# Patient Record
Sex: Female | Born: 1970 | Race: White | Hispanic: No | Marital: Single | Smoking: Current every day smoker
Health system: Southern US, Community
[De-identification: ages and names within clinical notes are randomized; demographics above are authoritative.]

## PROBLEM LIST (undated history)

## (undated) DIAGNOSIS — Z9889 Other specified postprocedural states: Secondary | ICD-10-CM

## (undated) DIAGNOSIS — Z8041 Family history of malignant neoplasm of ovary: Secondary | ICD-10-CM

## (undated) DIAGNOSIS — R112 Nausea with vomiting, unspecified: Secondary | ICD-10-CM

## (undated) DIAGNOSIS — F32A Depression, unspecified: Secondary | ICD-10-CM

## (undated) DIAGNOSIS — I10 Essential (primary) hypertension: Secondary | ICD-10-CM

## (undated) DIAGNOSIS — F419 Anxiety disorder, unspecified: Secondary | ICD-10-CM

## (undated) DIAGNOSIS — R51 Headache: Secondary | ICD-10-CM

## (undated) DIAGNOSIS — K047 Periapical abscess without sinus: Secondary | ICD-10-CM

## (undated) DIAGNOSIS — K56609 Unspecified intestinal obstruction, unspecified as to partial versus complete obstruction: Secondary | ICD-10-CM

## (undated) DIAGNOSIS — R933 Abnormal findings on diagnostic imaging of other parts of digestive tract: Secondary | ICD-10-CM

## (undated) DIAGNOSIS — E0590 Thyrotoxicosis, unspecified without thyrotoxic crisis or storm: Secondary | ICD-10-CM

## (undated) DIAGNOSIS — IMO0001 Reserved for inherently not codable concepts without codable children: Secondary | ICD-10-CM

## (undated) DIAGNOSIS — Z803 Family history of malignant neoplasm of breast: Secondary | ICD-10-CM

## (undated) DIAGNOSIS — F329 Major depressive disorder, single episode, unspecified: Secondary | ICD-10-CM

## (undated) DIAGNOSIS — K219 Gastro-esophageal reflux disease without esophagitis: Secondary | ICD-10-CM

## (undated) DIAGNOSIS — R519 Headache, unspecified: Secondary | ICD-10-CM

## (undated) DIAGNOSIS — J449 Chronic obstructive pulmonary disease, unspecified: Secondary | ICD-10-CM

## (undated) HISTORY — DX: Unspecified intestinal obstruction, unspecified as to partial versus complete obstruction: K56.609

## (undated) HISTORY — PX: FOOT SURGERY: SHX648

## (undated) HISTORY — DX: Depression, unspecified: F32.A

## (undated) HISTORY — DX: Major depressive disorder, single episode, unspecified: F32.9

## (undated) HISTORY — PX: WISDOM TOOTH EXTRACTION: SHX21

## (undated) HISTORY — DX: Family history of malignant neoplasm of breast: Z80.3

## (undated) HISTORY — DX: Abnormal findings on diagnostic imaging of other parts of digestive tract: R93.3

## (undated) HISTORY — DX: Anxiety disorder, unspecified: F41.9

## (undated) HISTORY — DX: Family history of malignant neoplasm of ovary: Z80.41

## (undated) HISTORY — DX: Periapical abscess without sinus: K04.7

---

## 1997-12-16 HISTORY — PX: DILATION AND CURETTAGE OF UTERUS: SHX78

## 1998-09-27 ENCOUNTER — Emergency Department (HOSPITAL_COMMUNITY): Admission: EM | Admit: 1998-09-27 | Discharge: 1998-09-27 | Payer: Self-pay | Admitting: Emergency Medicine

## 1998-09-28 ENCOUNTER — Ambulatory Visit (HOSPITAL_COMMUNITY): Admission: RE | Admit: 1998-09-28 | Discharge: 1998-09-28 | Payer: Self-pay | Admitting: Emergency Medicine

## 1998-12-16 HISTORY — PX: OTHER SURGICAL HISTORY: SHX169

## 2002-05-16 ENCOUNTER — Encounter: Payer: Self-pay | Admitting: Emergency Medicine

## 2002-05-16 ENCOUNTER — Emergency Department (HOSPITAL_COMMUNITY): Admission: EM | Admit: 2002-05-16 | Discharge: 2002-05-16 | Payer: Self-pay

## 2002-07-05 ENCOUNTER — Emergency Department (HOSPITAL_COMMUNITY): Admission: EM | Admit: 2002-07-05 | Discharge: 2002-07-05 | Payer: Self-pay | Admitting: Emergency Medicine

## 2002-08-16 ENCOUNTER — Emergency Department (HOSPITAL_COMMUNITY): Admission: EM | Admit: 2002-08-16 | Discharge: 2002-08-16 | Payer: Self-pay | Admitting: Emergency Medicine

## 2002-08-16 ENCOUNTER — Encounter: Payer: Self-pay | Admitting: Emergency Medicine

## 2002-08-21 ENCOUNTER — Emergency Department (HOSPITAL_COMMUNITY): Admission: EM | Admit: 2002-08-21 | Discharge: 2002-08-21 | Payer: Self-pay | Admitting: Emergency Medicine

## 2002-08-21 ENCOUNTER — Encounter: Payer: Self-pay | Admitting: Emergency Medicine

## 2002-10-11 ENCOUNTER — Emergency Department (HOSPITAL_COMMUNITY): Admission: EM | Admit: 2002-10-11 | Discharge: 2002-10-11 | Payer: Self-pay | Admitting: *Deleted

## 2002-10-11 ENCOUNTER — Encounter: Payer: Self-pay | Admitting: Emergency Medicine

## 2003-02-17 ENCOUNTER — Encounter: Payer: Self-pay | Admitting: Emergency Medicine

## 2003-02-17 ENCOUNTER — Emergency Department (HOSPITAL_COMMUNITY): Admission: EM | Admit: 2003-02-17 | Discharge: 2003-02-17 | Payer: Self-pay | Admitting: Emergency Medicine

## 2003-06-09 ENCOUNTER — Emergency Department (HOSPITAL_COMMUNITY): Admission: AD | Admit: 2003-06-09 | Discharge: 2003-06-09 | Payer: Self-pay | Admitting: Emergency Medicine

## 2003-12-17 HISTORY — PX: INCISE AND DRAIN ABCESS: PRO64

## 2005-10-09 ENCOUNTER — Emergency Department (HOSPITAL_COMMUNITY): Admission: EM | Admit: 2005-10-09 | Discharge: 2005-10-09 | Payer: Self-pay | Admitting: Emergency Medicine

## 2005-10-11 ENCOUNTER — Emergency Department (HOSPITAL_COMMUNITY): Admission: EM | Admit: 2005-10-11 | Discharge: 2005-10-11 | Payer: Self-pay | Admitting: Emergency Medicine

## 2006-03-04 ENCOUNTER — Encounter (INDEPENDENT_AMBULATORY_CARE_PROVIDER_SITE_OTHER): Payer: Self-pay | Admitting: *Deleted

## 2006-03-04 ENCOUNTER — Other Ambulatory Visit: Admission: RE | Admit: 2006-03-04 | Discharge: 2006-03-04 | Payer: Self-pay | Admitting: Family Medicine

## 2006-03-04 ENCOUNTER — Other Ambulatory Visit: Admission: RE | Admit: 2006-03-04 | Discharge: 2006-03-04 | Payer: Self-pay

## 2006-07-27 ENCOUNTER — Emergency Department (HOSPITAL_COMMUNITY): Admission: EM | Admit: 2006-07-27 | Discharge: 2006-07-27 | Payer: Self-pay | Admitting: Emergency Medicine

## 2006-08-20 ENCOUNTER — Ambulatory Visit (HOSPITAL_COMMUNITY): Admission: RE | Admit: 2006-08-20 | Discharge: 2006-08-20 | Payer: Self-pay | Admitting: Family Medicine

## 2006-09-18 ENCOUNTER — Emergency Department (HOSPITAL_COMMUNITY): Admission: EM | Admit: 2006-09-18 | Discharge: 2006-09-18 | Payer: Self-pay | Admitting: Emergency Medicine

## 2006-09-30 ENCOUNTER — Other Ambulatory Visit: Admission: RE | Admit: 2006-09-30 | Discharge: 2006-09-30 | Payer: Self-pay | Admitting: Unknown Physician Specialty

## 2006-09-30 ENCOUNTER — Encounter (INDEPENDENT_AMBULATORY_CARE_PROVIDER_SITE_OTHER): Payer: Self-pay | Admitting: *Deleted

## 2006-10-01 ENCOUNTER — Other Ambulatory Visit: Admission: RE | Admit: 2006-10-01 | Discharge: 2006-10-01 | Payer: Self-pay | Admitting: Unknown Physician Specialty

## 2007-06-03 ENCOUNTER — Emergency Department (HOSPITAL_COMMUNITY): Admission: EM | Admit: 2007-06-03 | Discharge: 2007-06-03 | Payer: Self-pay | Admitting: Emergency Medicine

## 2007-07-14 ENCOUNTER — Encounter (INDEPENDENT_AMBULATORY_CARE_PROVIDER_SITE_OTHER): Payer: Self-pay | Admitting: Unknown Physician Specialty

## 2007-07-14 ENCOUNTER — Other Ambulatory Visit: Admission: RE | Admit: 2007-07-14 | Discharge: 2007-07-14 | Payer: Self-pay | Admitting: Unknown Physician Specialty

## 2007-11-10 ENCOUNTER — Emergency Department (HOSPITAL_COMMUNITY): Admission: EM | Admit: 2007-11-10 | Discharge: 2007-11-10 | Payer: Self-pay | Admitting: Emergency Medicine

## 2008-11-22 ENCOUNTER — Emergency Department (HOSPITAL_COMMUNITY): Admission: EM | Admit: 2008-11-22 | Discharge: 2008-11-22 | Payer: Self-pay | Admitting: Emergency Medicine

## 2009-10-02 ENCOUNTER — Emergency Department (HOSPITAL_COMMUNITY): Admission: EM | Admit: 2009-10-02 | Discharge: 2009-10-03 | Payer: Self-pay | Admitting: Emergency Medicine

## 2009-12-28 ENCOUNTER — Emergency Department (HOSPITAL_COMMUNITY): Admission: EM | Admit: 2009-12-28 | Discharge: 2009-12-28 | Payer: Self-pay | Admitting: Emergency Medicine

## 2011-01-02 ENCOUNTER — Emergency Department (HOSPITAL_COMMUNITY)
Admission: EM | Admit: 2011-01-02 | Discharge: 2011-01-02 | Payer: Self-pay | Source: Home / Self Care | Admitting: Emergency Medicine

## 2011-03-03 LAB — BASIC METABOLIC PANEL
CO2: 26 mEq/L (ref 19–32)
Sodium: 138 mEq/L (ref 135–145)

## 2011-03-03 LAB — DIFFERENTIAL
Basophils Absolute: 0 10*3/uL (ref 0.0–0.1)
Basophils Relative: 0 % (ref 0–1)
Monocytes Absolute: 0.5 10*3/uL (ref 0.1–1.0)
Neutro Abs: 7.3 10*3/uL (ref 1.7–7.7)
Neutrophils Relative %: 74 % (ref 43–77)

## 2011-03-03 LAB — CBC
Hemoglobin: 14.4 g/dL (ref 12.0–15.0)
MCHC: 33.1 g/dL (ref 30.0–36.0)
Platelets: 270 10*3/uL (ref 150–400)
RBC: 4.52 MIL/uL (ref 3.87–5.11)

## 2011-03-21 LAB — BASIC METABOLIC PANEL
BUN: 9 mg/dL (ref 6–23)
CO2: 20 mEq/L (ref 19–32)
Creatinine, Ser: 0.89 mg/dL (ref 0.4–1.2)
GFR calc non Af Amer: >60 mL/min (ref >60)
Glucose, Bld: 96 mg/dL (ref 70–99)
Potassium: 3.6 mEq/L (ref 3.5–5.1)

## 2011-03-21 LAB — DIFFERENTIAL
Lymphs Abs: 1 10*3/uL (ref 0.7–4.0)
Monocytes Relative: 5 % (ref 3–12)
Neutro Abs: 15.2 10*3/uL — ABNORMAL HIGH (ref 1.7–7.7)

## 2011-03-21 LAB — GLUCOSE, CAPILLARY

## 2011-03-21 LAB — CBC
MCHC: 34.6 g/dL (ref 30.0–36.0)
MCV: 97.8 fL (ref 78.0–100.0)

## 2011-09-20 LAB — URINALYSIS, ROUTINE W REFLEX MICROSCOPIC
Bilirubin Urine: NEGATIVE
Glucose, UA: NEGATIVE mg/dL
Ketones, ur: NEGATIVE mg/dL
Nitrite: NEGATIVE
Specific Gravity, Urine: 1.013 (ref 1.005–1.030)

## 2011-09-20 LAB — URINE MICROSCOPIC-ADD ON

## 2012-03-16 ENCOUNTER — Other Ambulatory Visit: Payer: Self-pay | Admitting: Infectious Diseases

## 2012-03-16 ENCOUNTER — Ambulatory Visit
Admission: RE | Admit: 2012-03-16 | Discharge: 2012-03-16 | Disposition: A | Discharge status: Home / Self Care | Payer: No Typology Code available for payment source | Source: Ambulatory Visit | Attending: Infectious Diseases | Admitting: Infectious Diseases

## 2012-03-16 DIAGNOSIS — R7611 Nonspecific reaction to tuberculin skin test without active tuberculosis: Secondary | ICD-10-CM

## 2012-11-10 ENCOUNTER — Ambulatory Visit (INDEPENDENT_AMBULATORY_CARE_PROVIDER_SITE_OTHER): Payer: Self-pay | Admitting: Family Medicine

## 2012-11-10 ENCOUNTER — Encounter: Payer: Self-pay | Admitting: Family Medicine

## 2012-11-10 VITALS — BP 134/89 | HR 69 | Ht 64.0 in | Wt 192.0 lb

## 2012-11-10 DIAGNOSIS — I10 Essential (primary) hypertension: Secondary | ICD-10-CM | POA: Insufficient documentation

## 2012-11-10 DIAGNOSIS — F17200 Nicotine dependence, unspecified, uncomplicated: Secondary | ICD-10-CM

## 2012-11-10 DIAGNOSIS — Z720 Tobacco use: Secondary | ICD-10-CM

## 2012-11-10 DIAGNOSIS — R635 Abnormal weight gain: Secondary | ICD-10-CM

## 2012-11-10 DIAGNOSIS — R609 Edema, unspecified: Secondary | ICD-10-CM

## 2012-11-10 DIAGNOSIS — IMO0001 Reserved for inherently not codable concepts without codable children: Secondary | ICD-10-CM

## 2012-11-10 DIAGNOSIS — E663 Overweight: Secondary | ICD-10-CM | POA: Insufficient documentation

## 2012-11-10 DIAGNOSIS — R030 Elevated blood-pressure reading, without diagnosis of hypertension: Secondary | ICD-10-CM

## 2012-11-10 DIAGNOSIS — E669 Obesity, unspecified: Secondary | ICD-10-CM | POA: Insufficient documentation

## 2012-11-10 MED ORDER — VARENICLINE TARTRATE 0.5 MG X 11 & 1 MG X 42 PO MISC: ORAL | Status: DC

## 2012-11-10 NOTE — Assessment & Plan Note (Signed)
Starting Chantix (risks benefits discussed adn pt amenable to try) Continue E cig

## 2012-11-10 NOTE — Assessment & Plan Note (Signed)
BP today is prehypertensive.  Will reckeck at next appt No need to start therapy at this time Will have pt check at home

## 2012-11-10 NOTE — Assessment & Plan Note (Signed)
Likely secondary to Latuda dose increase.  Continue diet and exercise.  Pt to return in 2 wks Pt to discuss changing medication w/ [facility] doctor

## 2012-11-10 NOTE — Assessment & Plan Note (Signed)
Exact etiology unclear.  Likely secondary to wt gain and medications.  No evidence for cardio hepatic cause at this time. Will follow

## 2012-11-10 NOTE — Patient Instructions (Addendum)
Thank you for coming in today You are doing well Keep checking your blood pressure from time to time at home or in stores Keep trying to quit smoking Start the Chantix if able.  Stop this medicine if you have any heart symptoms or if you feel like your depression is gettign worse Go to [email or web address] for more stop smoking information Please continue to exercise daily and to meet with your Behavioral Medicine doctor Please come back to see me in 2 weeks. We will check your weight, do a pap smear, and see how your smoking is going Drive safe and have a great day.

## 2012-11-10 NOTE — Progress Notes (Signed)
Mariah Rice is a 41 y.o. female who presents to [facility] today for new patient visit  Depression: followed by [facility] physician. Started Jordan some time ago but had dose doubled about 1 mo ago. Denies SI/HI. Mood is improved  Tobacco abuse: continues to smoke 1/2ppd. Would like to quit. Smoked since age 20. Denies SOB, unintentional wt loss. Using E.cig to try and quit  Wt gain: reports 30lb wt gain in last month. Denies any chang ein diet or exercise routine. Walks QOD. Increased Latuda about 1 mo ago. Denies any cold intolerance, n/v/d/c, fatigue, palpitations. Does report some Le swelling since putting on wt.   Hypertension: Pt reports recent elevated BPs at other offices in the 150s. Denies CP, Ha, Lightheadedness, syncope, SOB. No previous medications for BP.    The following portions of the patient's history were reviewed and updated as appropriate: allergies, current medications, past medical history, family and social history, and problem list.  Patient is a smoker  Past Medical History  Diagnosis Date  . Depression   . Anxiety   . Dental abscess     ROS as above otherwise neg.    Medications reviewed. Current Outpatient Prescriptions  Medication Sig Dispense Refill  . hydrOXYzine (ATARAX/VISTARIL) 50 MG tablet Take 50 mg by mouth 3 (three) times daily as needed.      . lurasidone (LATUDA) 40 MG TABS Take 80 mg by mouth at bedtime.      . varenicline (CHANTIX PAK) 0.5 MG X 11 & 1 MG X 42 tablet Take one 0.5 mg tablet by mouth once daily for 3 days, then increase to one 0.5 mg tablet twice daily for 4 days, then increase to one 1 mg tablet twice daily.  53 tablet  0    Exam:  BP 134/89  Pulse 69  Ht 5\' 4"  (1.626 m)  Wt 192 lb (87.091 kg)  BMI 32.96 kg/m2 Gen: Well NAD HEENT: EOMI,  MMM, no thyromegaly Lungs: CTABL Nl WOB Heart: RRR no MRG, 2+ pedal pulses Abd: NABS, NT, ND Ext: Trace LE edema Exts: Non edematous BL  LE, warm and well perfused.   No results found for  this or any previous visit (from the past 72 hour(s)).

## 2012-11-24 ENCOUNTER — Encounter: Payer: Self-pay | Admitting: Family Medicine

## 2012-11-24 ENCOUNTER — Ambulatory Visit (INDEPENDENT_AMBULATORY_CARE_PROVIDER_SITE_OTHER): Payer: Self-pay | Admitting: Family Medicine

## 2012-11-24 VITALS — BP 151/97 | HR 79 | Temp 98.3°F | Ht 64.0 in | Wt 193.0 lb

## 2012-11-24 DIAGNOSIS — R635 Abnormal weight gain: Secondary | ICD-10-CM

## 2012-11-24 DIAGNOSIS — Z23 Encounter for immunization: Secondary | ICD-10-CM

## 2012-11-24 DIAGNOSIS — R51 Headache: Secondary | ICD-10-CM

## 2012-11-24 DIAGNOSIS — R030 Elevated blood-pressure reading, without diagnosis of hypertension: Secondary | ICD-10-CM

## 2012-11-24 DIAGNOSIS — R519 Headache, unspecified: Secondary | ICD-10-CM

## 2012-11-24 DIAGNOSIS — F17200 Nicotine dependence, unspecified, uncomplicated: Secondary | ICD-10-CM

## 2012-11-24 DIAGNOSIS — Z720 Tobacco use: Secondary | ICD-10-CM

## 2012-11-24 DIAGNOSIS — IMO0001 Reserved for inherently not codable concepts without codable children: Secondary | ICD-10-CM

## 2012-11-24 LAB — COMPREHENSIVE METABOLIC PANEL
ALT: 17 U/L (ref 0–35)
AST: 16 U/L (ref 0–37)
Alkaline Phosphatase: 65 U/L (ref 39–117)
BUN: 12 mg/dL (ref 6–23)
Calcium: 9.1 mg/dL (ref 8.4–10.5)
Creat: 0.8 mg/dL (ref 0.50–1.10)
Glucose, Bld: 85 mg/dL (ref 70–99)
Potassium: 4.1 mEq/L (ref 3.5–5.3)

## 2012-11-24 NOTE — Assessment & Plan Note (Signed)
Nursing BP check in 2 weeks If elevated again will start HCTZ

## 2012-11-24 NOTE — Assessment & Plan Note (Signed)
CMET today 

## 2012-11-24 NOTE — Assessment & Plan Note (Signed)
Continue E cig  CHantix when can afford

## 2012-11-24 NOTE — Assessment & Plan Note (Signed)
Likely migraines Ibuprofen, tylenol, and excedrin regimens given Pt to keep log of triggers, alleviating, and aggrevating factors.

## 2012-11-24 NOTE — Patient Instructions (Addendum)
Thank you for coming in today You are doing well Please follow up with your behavioral health doctor regarding your anxiety and depression Please come back in 2 weeks for a blood pressure check with the nurses If your blood pressure is elevated again I would like to start you on a blood pressure medication We will obtain blood work today to look at the overall health of your body Please come back to see me in 1 month Remember to keep a careful record of what is causing your headaches or sleep disturbances You may take 600-800mg  of ibuprofen every 6 hours for your headache, or 1000mg  of tylenol every 6 hours for your headache, or 1-2 as indicated on the bottle.  Remember to save up where possible for the chantix and let me know when you have finally started using the medication You are doing a great job on quitting smoking.  Have a great day and a [employer]  Call with any questions or concerns: [phone]  Migraine Headache A migraine headache is an intense, throbbing pain on one or both sides of your head. A migraine can last for 30 minutes to several hours. CAUSES  The exact cause of a migraine headache is not always known. However, a migraine may be caused when nerves in the brain become irritated and release chemicals that cause inflammation. This causes pain. SYMPTOMS  Pain on one or both sides of your head.  Pulsating or throbbing pain.  Severe pain that prevents daily activities.  Pain that is aggravated by any physical activity.  Nausea, vomiting, or both.  Dizziness.  Pain with exposure to bright lights, loud noises, or activity.  General sensitivity to bright lights, loud noises, or smells. Before you get a migraine, you may get warning signs that a migraine is coming (aura). An aura may include:  Seeing flashing lights.  Seeing bright spots, halos, or zig-zag lines.  Having tunnel vision or blurred vision.  Having feelings of numbness or tingling.  Having  trouble talking.  Having muscle weakness. MIGRAINE TRIGGERS  Alcohol.  Smoking.  Stress.  Menstruation.  Aged cheeses.  Foods or drinks that contain nitrates, glutamate, aspartame, or tyramine.  Lack of sleep.  Chocolate.  Caffeine.  Hunger.  Physical exertion.  Fatigue.  Medicines used to treat chest pain (nitroglycerine), birth control pills, estrogen, and some blood pressure medicines. DIAGNOSIS  A migraine headache is often diagnosed based on:  Symptoms.  Physical examination.  A CT scan or MRI of your head. TREATMENT Medicines may be given for pain and nausea. Medicines can also be given to help prevent recurrent migraines.  HOME CARE INSTRUCTIONS  Only take over-the-counter or prescription medicines for pain or discomfort as directed by your caregiver. The use of long-term narcotics is not recommended.  Lie down in a dark, quiet room when you have a migraine.  Keep a journal to find out what may trigger your migraine headaches. For example, write down:  What you eat and drink.  How much sleep you get.  Any change to your diet or medicines.  Limit alcohol consumption.  Quit smoking if you smoke.  Get 7 to 9 hours of sleep, or as recommended by your caregiver.  Limit stress.  Keep lights dim if bright lights bother you and make your migraines worse. SEEK IMMEDIATE MEDICAL CARE IF:   Your migraine becomes severe.  You have a fever.  You have a stiff neck.  You have vision loss.  You have muscular weakness  or loss of muscle control.  You start losing your balance or have trouble walking.  You feel faint or pass out.  You have severe symptoms that are different from your first symptoms. MAKE SURE YOU:   Understand these instructions.  Will watch your condition.  Will get help right away if you are not doing well or get worse. Document Released: 12/02/2005 Document Revised: 02/24/2012 Document Reviewed: 11/22/2011 [facility]  Patient Information 2013 [place], [place].

## 2012-11-24 NOTE — Progress Notes (Signed)
Mariah Rice is a 41 y.o. female who presents to [facility] today for anxiety  Anxiety: Pt actively managed by [facility] for anxiety and depression. Pt on Latuda and Atarax but feeling like it doesn't work. Increased stress lately. Has not seen children in 8 years and current living situtuation is very stressful as she lives w/ boyfriend and his mother.   Tobacco: continues to smoke but has cut use in half to 5 cig p day. Continues to use E cig. Can't afford Chantix yet.   HTN: Elevated today in clinic. Denies CP, pallpitaitons, lightheadedness, SOB. No previous BP meds  HA: Started off and on 6 mo ago. Feels like "drunk" when w/ HA. Photophobia. Ibuprofen 200mg  w/ some relief. Occurs as often as 1p/day. Dad w/ h/o migraines. Denies any sensory change, change in mentation, balance or hearing issues. No head trauma. Has had HAoff and on throughotu life. Pt stress level siginificantly elevated and on new mediations.    The following portions of the patient's history were reviewed and updated as appropriate: allergies, current medications, past medical history, family and social history, and problem list.  Patient is a smoker: 5 cig p/d  Past Medical History  Diagnosis Date  . Depression   . Anxiety   . Dental abscess     ROS as above otherwise neg.    Medications reviewed. Current Outpatient Prescriptions  Medication Sig Dispense Refill  . hydrOXYzine (ATARAX/VISTARIL) 50 MG tablet Take 50 mg by mouth 3 (three) times daily as needed.      . lurasidone (LATUDA) 40 MG TABS Take 80 mg by mouth at bedtime.      . varenicline (CHANTIX PAK) 0.5 MG X 11 & 1 MG X 42 tablet Take one 0.5 mg tablet by mouth once daily for 3 days, then increase to one 0.5 mg tablet twice daily for 4 days, then increase to one 1 mg tablet twice daily.  53 tablet  0    Exam: BP 151/97  Pulse 79  Temp 98.3 F (36.8 C) (Oral)  Ht 5\' 4"  (1.626 m)  Wt 193 lb (87.544 kg)  BMI 33.13 kg/m2 Gen: Well NAD Neuro: CN intact,  cerebellar function normal. Proprioception intact, no photophobia today. Psych: Saddaned but affect appropriate   No results found for this or any previous visit (from the past 72 hour(s)).  '

## 2012-12-07 ENCOUNTER — Ambulatory Visit (INDEPENDENT_AMBULATORY_CARE_PROVIDER_SITE_OTHER): Payer: Self-pay | Admitting: *Deleted

## 2012-12-07 ENCOUNTER — Telehealth: Payer: Self-pay | Admitting: Family Medicine

## 2012-12-07 VITALS — BP 144/88 | HR 80

## 2012-12-07 DIAGNOSIS — I10 Essential (primary) hypertension: Secondary | ICD-10-CM

## 2012-12-07 MED ORDER — HYDROCHLOROTHIAZIDE 25 MG PO TABS: 12.5000 mg | ORAL_TABLET | Freq: Every day | ORAL | Status: DC

## 2012-12-07 NOTE — Telephone Encounter (Signed)
Pt needs to speak with Dr Konrad Dolores to day about her BP - came in to see nurse and needs to know if he is going to put her on BP meds

## 2012-12-07 NOTE — Assessment & Plan Note (Signed)
BP today at nursing check LA 150/90 and RA 144/88 SBP at work routinely in the 150s per pt Will start HCTZ 12.5 F/u in clinic in early January

## 2012-12-07 NOTE — Progress Notes (Signed)
Patient in for BP check. BP checked manually using regular adult cuff. BP  LA 150/90 and RA 144/88. Pulse 80. Reports headaches off and on for 2 months . Medication Dr.Merrell suggested, Excedrin Migraine and ibuprofen not helping.  Will forward to MD and call patient back today.

## 2012-12-10 NOTE — Progress Notes (Signed)
Dr. Konrad Dolores contacted patient on 12/23.

## 2012-12-23 ENCOUNTER — Ambulatory Visit (INDEPENDENT_AMBULATORY_CARE_PROVIDER_SITE_OTHER): Payer: Self-pay | Admitting: Family Medicine

## 2012-12-23 ENCOUNTER — Encounter: Payer: Self-pay | Admitting: Family Medicine

## 2012-12-23 VITALS — BP 129/88 | HR 78 | Ht 64.0 in | Wt 195.0 lb

## 2012-12-23 DIAGNOSIS — R51 Headache: Secondary | ICD-10-CM

## 2012-12-23 DIAGNOSIS — Z720 Tobacco use: Secondary | ICD-10-CM

## 2012-12-23 DIAGNOSIS — R635 Abnormal weight gain: Secondary | ICD-10-CM

## 2012-12-23 DIAGNOSIS — R519 Headache, unspecified: Secondary | ICD-10-CM

## 2012-12-23 DIAGNOSIS — F17200 Nicotine dependence, unspecified, uncomplicated: Secondary | ICD-10-CM

## 2012-12-23 DIAGNOSIS — I10 Essential (primary) hypertension: Secondary | ICD-10-CM

## 2012-12-23 MED ORDER — HYDROCHLOROTHIAZIDE 25 MG PO TABS: 25.0000 mg | ORAL_TABLET | Freq: Every day | ORAL | Status: DC

## 2012-12-23 NOTE — Assessment & Plan Note (Signed)
Doing well.  Continue E cig

## 2012-12-23 NOTE — Progress Notes (Signed)
Mariah Rice is a 42 y.o. female who presents to [facility] today for BP  HTN: Started on HCTZ 12.5 but continued to have HA and elevated BP to the 140-150s systolic. Increased to 25 and HA almost completely resolved. Pt has felt very well since increasing dose. Denies CP, palpitations lightheadedness, sycope, SOB.   HA: Improved dramatically since starting HCTZ 25.  Anxiety. Pt not happy w/ BH porvider. Stoppped going and would like therapy here. Stopped Latuda and hydroxyzine >3wks ago. Feels bettwer but still anxious.   Wt gain: Pre Latuda wt 150. Increased eating since latuda.   Tobacco: still smoking 4 cig per day. On E-cig w/ continued improvement   The following portions of the patient's history were reviewed and updated as appropriate: allergies, current medications, past medical history, family and social history, and problem list.  Patient is a smoker  Past Medical History  Diagnosis Date  . Depression   . Anxiety   . Dental abscess     ROS as above otherwise neg.    Medications reviewed. Current Outpatient Prescriptions  Medication Sig Dispense Refill  . hydrochlorothiazide (HYDRODIURIL) 25 MG tablet Take 0.5 tablets (12.5 mg total) by mouth daily.  30 tablet  3  . hydrOXYzine (ATARAX/VISTARIL) 50 MG tablet Take 50 mg by mouth 3 (three) times daily as needed.      . lurasidone (LATUDA) 40 MG TABS Take 80 mg by mouth at bedtime.      . varenicline (CHANTIX PAK) 0.5 MG X 11 & 1 MG X 42 tablet Take one 0.5 mg tablet by mouth once daily for 3 days, then increase to one 0.5 mg tablet twice daily for 4 days, then increase to one 1 mg tablet twice daily.  53 tablet  0    Exam: BP 129/88  Pulse 78  Ht 5\' 4"  (1.626 m)  Wt 195 lb (88.451 kg)  BMI 33.47 kg/m2 Gen: Well NAD HEENT: EOMI,  MMM Lungs: Nl WOB   No results found for this or any previous visit (from the past 72 hour(s)).

## 2012-12-23 NOTE — Patient Instructions (Addendum)
Thank you for coming in today Please continue to to take your HCTZ 25mg  for your headache Please continue to take your Hydroxyzine as needed for anxiety Please schedule a follow up appointment with me to discuss your anxiety Please continue to work on stopping smoking. You are doing great Please call 24/7 if you have any further concerns.

## 2012-12-23 NOTE — Assessment & Plan Note (Signed)
Im[proved w/ BP control Continue w/ motrin adn tylenol prn

## 2012-12-23 NOTE — Assessment & Plan Note (Signed)
Changed Rx to HCTZ to 25 daily Doing very well.  continue current dose.

## 2012-12-23 NOTE — Assessment & Plan Note (Signed)
Daily wts at home Likely to benefit from Dc of Mariah Rice

## 2013-01-06 ENCOUNTER — Encounter: Payer: Self-pay | Admitting: Family Medicine

## 2013-01-06 ENCOUNTER — Ambulatory Visit (INDEPENDENT_AMBULATORY_CARE_PROVIDER_SITE_OTHER): Payer: Self-pay | Admitting: Family Medicine

## 2013-01-06 VITALS — BP 124/88 | HR 87 | Temp 98.4°F | Ht 64.0 in | Wt 194.0 lb

## 2013-01-06 DIAGNOSIS — I10 Essential (primary) hypertension: Secondary | ICD-10-CM

## 2013-01-06 DIAGNOSIS — F411 Generalized anxiety disorder: Secondary | ICD-10-CM

## 2013-01-06 DIAGNOSIS — F329 Major depressive disorder, single episode, unspecified: Secondary | ICD-10-CM

## 2013-01-06 DIAGNOSIS — F32A Depression, unspecified: Secondary | ICD-10-CM

## 2013-01-06 DIAGNOSIS — F3289 Other specified depressive episodes: Secondary | ICD-10-CM

## 2013-01-06 MED ORDER — CITALOPRAM HYDROBROMIDE 20 MG PO TABS: 20.0000 mg | ORAL_TABLET | Freq: Every day | ORAL | Status: DC

## 2013-01-06 NOTE — Assessment & Plan Note (Addendum)
Starting Citalopram 20mg  daily. Treating depression and anxiety. Would prefer to use paxil but pt tried previously w/o benefit.  PHQ9: 20. Denies SI/HI F/u in 2 wks

## 2013-01-06 NOTE — Assessment & Plan Note (Signed)
Well controlled, continue HCTZ 
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
## 2013-01-06 NOTE — Patient Instructions (Addendum)
Thank you for coming in today You are doing well Please continue to take your blood pressure medicine Please continue to try to stop smoking Start taking the citalopram and plan on seeing me in 2 weeks or sooner if needed Please use your Vistaril as needed for additional help This medicine should help significantly    Depression, Adult Depression refers to feeling sad, low, down in the dumps, blue, gloomy, or empty. In general, there are two kinds of depression: 1. Depression that we all experience from time to time because of upsetting life experiences, including the loss of a job or the ending of a relationship (normal sadness or normal grief). This kind of depression is considered normal, is short lived, and resolves within a few days to 2 weeks. (Depression experienced after the loss of a loved one is called bereavement. Bereavement often lasts longer than 2 weeks but normally gets better with time.) 2. Clinical depression, which lasts longer than normal sadness or normal grief or interferes with your ability to function at home, at work, and in school. It also interferes with your personal relationships. It affects almost every aspect of your life. Clinical depression is an illness. Symptoms of depression also can be caused by conditions other than normal sadness and grief or clinical depression. Examples of these conditions are listed as follows:  Physical illness Some physical illnesses, including underactive thyroid gland (hypothyroidism), severe anemia, specific types of cancer, diabetes, uncontrolled seizures, heart and lung problems, strokes, and chronic pain are commonly associated with symptoms of depression.  Side effects of some prescription medicine In some people, certain types of prescription medicine can cause symptoms of depression.  Substance abuse Abuse of alcohol and illicit drugs can cause symptoms of depression. SYMPTOMS Symptoms of normal sadness and normal grief  include the following:  Feeling sad or crying for short periods of time.  Not caring about anything (apathy).  Difficulty sleeping or sleeping too much.  No longer able to enjoy the things you used to enjoy.  Desire to be by oneself all the time (social isolation).  Lack of energy or motivation.  Difficulty concentrating or remembering.  Change in appetite or weight.  Restlessness or agitation. Symptoms of clinical depression include the same symptoms of normal sadness or normal grief and also the following symptoms:  Feeling sad or crying all the time.  Feelings of guilt or worthlessness.  Feelings of hopelessness or helplessness.  Thoughts of suicide or the desire to harm yourself (suicidal ideation).  Loss of touch with reality (psychotic symptoms). Seeing or hearing things that are not real (hallucinations) or having false beliefs about your life or the people around you (delusions and paranoia). DIAGNOSIS  The diagnosis of clinical depression usually is based on the severity and duration of the symptoms. Your caregiver also will ask you questions about your medical history and substance use to find out if physical illness, use of prescription medicine, or substance abuse is causing your depression. Your caregiver also may order blood tests. TREATMENT  Typically, normal sadness and normal grief do not require treatment. However, sometimes antidepressant medicine is prescribed for bereavement to ease the depressive symptoms until they resolve. The treatment for clinical depression depends on the severity of your symptoms but typically includes antidepressant medicine, counseling with a mental health professional, or a combination of both. Your caregiver will help to determine what treatment is best for you. Depression caused by physical illness usually goes away with appropriate medical treatment of the illness.  If prescription medicine is causing depression, talk with your  caregiver about stopping the medicine, decreasing the dose, or substituting another medicine. Depression caused by abuse of alcohol or illicit drugs abuse goes away with abstinence from these substances. Some adults need professional help in order to stop drinking or using drugs. SEEK IMMEDIATE CARE IF:  You have thoughts about hurting yourself or others.  You lose touch with reality (have psychotic symptoms).  You are taking medicine for depression and have a serious side effect. FOR MORE INFORMATION National Alliance on Mental Illness: www.nami.[employer]: http://www.maynard.net/ Document Released: 11/29/2000 Document Revised: 06/02/2012 Document Reviewed: 03/02/2012 [facility] Patient Information 2013 [place], [place].

## 2013-01-06 NOTE — Assessment & Plan Note (Signed)
Pt has tried several SSRIs that I would initially start for anxiety Will start Citalopram. While not directly indicated for GAD I anticipate improvement May increase Vistaril to 100mg  PRN for anxiousness F/u in 2 wks

## 2013-01-22 ENCOUNTER — Ambulatory Visit: Payer: Self-pay | Admitting: Family Medicine

## 2013-02-05 ENCOUNTER — Ambulatory Visit (INDEPENDENT_AMBULATORY_CARE_PROVIDER_SITE_OTHER): Payer: Self-pay | Admitting: Family Medicine

## 2013-02-05 ENCOUNTER — Encounter: Payer: Self-pay | Admitting: Family Medicine

## 2013-02-05 VITALS — BP 124/86 | HR 84 | Ht 64.0 in | Wt 192.0 lb

## 2013-02-05 DIAGNOSIS — F411 Generalized anxiety disorder: Secondary | ICD-10-CM

## 2013-02-05 DIAGNOSIS — R519 Headache, unspecified: Secondary | ICD-10-CM

## 2013-02-05 DIAGNOSIS — F32A Depression, unspecified: Secondary | ICD-10-CM

## 2013-02-05 DIAGNOSIS — I10 Essential (primary) hypertension: Secondary | ICD-10-CM

## 2013-02-05 DIAGNOSIS — F329 Major depressive disorder, single episode, unspecified: Secondary | ICD-10-CM

## 2013-02-05 DIAGNOSIS — R635 Abnormal weight gain: Secondary | ICD-10-CM

## 2013-02-05 DIAGNOSIS — R51 Headache: Secondary | ICD-10-CM

## 2013-02-05 NOTE — Patient Instructions (Addendum)
You are doing GREAT!!! Please continue taking your medications as prescribed Please come back to see me in 4 weeks We will treat the spot on your face at that time. Please call any time with any questions or concerns  Have a great weekend. Go Heels.

## 2013-02-05 NOTE — Assessment & Plan Note (Signed)
Denies depressive symptoms. PHQ9 score of 0 Continue current therapy

## 2013-02-05 NOTE — Assessment & Plan Note (Addendum)
Down 2lbs from previous exam Pt very motivated in part due to improved depression Continue w/ hlthy eating and exercise

## 2013-02-05 NOTE — Assessment & Plan Note (Signed)
At goal today Pt tolerating HCTZ well Will check BMP at next appt

## 2013-02-05 NOTE — Assessment & Plan Note (Signed)
No current HA Likely improvement w/ BP and stress/depression

## 2013-02-05 NOTE — Assessment & Plan Note (Signed)
GAD 7 score 0 today Continue current therapy

## 2013-02-05 NOTE — Progress Notes (Signed)
Mariah Rice is a 42 y.o. female who presents to [facility] today for   HTN: Taking HCTZ 25mg . Feels better. Denies CP, Palpitations, SOB, HA  Wt loss: Eating less and exercising. In part from feeling so much better.  Depression: No crying spells. Feels significantly better. Smiling and happy in office today. Denies SI/HI. Olene Floss has noticed that pt is doing better. Stressors are still present but able to deal with them better.    The following portions of the patient's history were reviewed and updated as appropriate: allergies, current medications, past medical history, family and social history, and problem list.  Patient is a smoker  Past Medical History  Diagnosis Date  . Depression   . Anxiety   . Dental abscess     ROS as above otherwise neg.    Medications reviewed. Current Outpatient Prescriptions  Medication Sig Dispense Refill  . citalopram (CELEXA) 20 MG tablet Take 1 tablet (20 mg total) by mouth daily.  30 tablet  3  . hydrochlorothiazide (HYDRODIURIL) 25 MG tablet Take 1 tablet (25 mg total) by mouth daily.  30 tablet  3  . hydrOXYzine (ATARAX/VISTARIL) 50 MG tablet Take 50 mg by mouth 3 (three) times daily as needed.      . varenicline (CHANTIX PAK) 0.5 MG X 11 & 1 MG X 42 tablet Take one 0.5 mg tablet by mouth once daily for 3 days, then increase to one 0.5 mg tablet twice daily for 4 days, then increase to one 1 mg tablet twice daily.  53 tablet  0   No current facility-administered medications for this visit.    Exam: BP 124/86  Pulse 84  Ht 5\' 4"  (1.626 m)  Wt 192 lb (87.091 kg)  BMI 32.94 kg/m2  LMP 02/05/2013 Gen: Well NAD HEENT: EOMI,  MMM Lungs: Nl WOB Skin: Intact, warm well perfused, no rash Neuro: CN grossly intact, cerebellar function normal  No results found for this or any previous visit (from the past 72 hour(s)).

## 2013-03-09 ENCOUNTER — Ambulatory Visit: Payer: Self-pay | Admitting: Family Medicine

## 2013-04-05 ENCOUNTER — Other Ambulatory Visit (HOSPITAL_COMMUNITY)
Admission: RE | Admit: 2013-04-05 | Discharge: 2013-04-05 | Disposition: A | Discharge status: Home / Self Care | Payer: No Typology Code available for payment source | Source: Ambulatory Visit | Attending: Family Medicine | Admitting: Family Medicine

## 2013-04-05 ENCOUNTER — Ambulatory Visit (INDEPENDENT_AMBULATORY_CARE_PROVIDER_SITE_OTHER): Payer: No Typology Code available for payment source | Admitting: Family Medicine

## 2013-04-05 ENCOUNTER — Encounter: Payer: Self-pay | Admitting: Family Medicine

## 2013-04-05 VITALS — BP 122/70 | HR 88 | Temp 98.3°F | Ht 64.0 in | Wt 194.0 lb

## 2013-04-05 DIAGNOSIS — N939 Abnormal uterine and vaginal bleeding, unspecified: Secondary | ICD-10-CM | POA: Insufficient documentation

## 2013-04-05 DIAGNOSIS — N926 Irregular menstruation, unspecified: Secondary | ICD-10-CM

## 2013-04-05 DIAGNOSIS — F17200 Nicotine dependence, unspecified, uncomplicated: Secondary | ICD-10-CM

## 2013-04-05 DIAGNOSIS — D492 Neoplasm of unspecified behavior of bone, soft tissue, and skin: Secondary | ICD-10-CM

## 2013-04-05 DIAGNOSIS — Z1151 Encounter for screening for human papillomavirus (HPV): Secondary | ICD-10-CM | POA: Insufficient documentation

## 2013-04-05 DIAGNOSIS — Z01419 Encounter for gynecological examination (general) (routine) without abnormal findings: Secondary | ICD-10-CM | POA: Insufficient documentation

## 2013-04-05 DIAGNOSIS — Z720 Tobacco use: Secondary | ICD-10-CM

## 2013-04-05 DIAGNOSIS — L989 Disorder of the skin and subcutaneous tissue, unspecified: Secondary | ICD-10-CM | POA: Insufficient documentation

## 2013-04-05 DIAGNOSIS — D229 Melanocytic nevi, unspecified: Secondary | ICD-10-CM

## 2013-04-05 DIAGNOSIS — Z124 Encounter for screening for malignant neoplasm of cervix: Secondary | ICD-10-CM

## 2013-04-05 DIAGNOSIS — I10 Essential (primary) hypertension: Secondary | ICD-10-CM

## 2013-04-05 LAB — CBC
HCT: 42.2 % (ref 36.0–46.0)
Hemoglobin: 14.4 g/dL (ref 12.0–15.0)
MCV: 93.2 fL (ref 78.0–100.0)
RBC: 4.53 MIL/uL (ref 3.87–5.11)
RDW: 14.2 % (ref 11.5–15.5)

## 2013-04-05 NOTE — Assessment & Plan Note (Addendum)
Fibroids vs early menopause vs other hormonal abnormality vs malignancy. concenr for malignancy given significant family h/x and several years since previous PAP w/ what sounds like colpo vs numerous bx.  PAP done today CBC TSH

## 2013-04-05 NOTE — Assessment & Plan Note (Signed)
Excellent bp today Continue current therapy

## 2013-04-05 NOTE — Assessment & Plan Note (Signed)
Continues to decrease use Using E-cig very successfully.  Pt counseled to read package inserts for e cig vials to better understand nicotine ingestion so this can be tapered

## 2013-04-05 NOTE — Patient Instructions (Addendum)
Thank you for comingi n today Please come back in 4 weeks We will discuss your celexa dose and any more abnormalities in your lab results and period Please keep the area on your face clean and free from a lot of sun exposure Please call if you have any further concerns  Abnormal Uterine Bleeding Abnormal uterine bleeding can have many causes. Some cases are simply treated, while others are more serious. There are several kinds of bleeding that is considered abnormal, including:  Bleeding between periods.  Bleeding after sexual intercourse.  Spotting anytime in the menstrual cycle.  Bleeding heavier or more than normal.  Bleeding after menopause. CAUSES  There are many causes of abnormal uterine bleeding. It can be present in teenagers, pregnant women, women during their reproductive years, and women who have reached menopause. Your caregiver will look for the more common causes depending on your age, signs, symptoms and your particular circumstance. Most cases are not serious and can be treated. Even the more serious causes, like cancer of the female organs, can be treated adequately if found in the early stages. That is why all types of bleeding should be evaluated and treated as soon as possible. DIAGNOSIS  Diagnosing the cause may take several kinds of tests. Your caregiver may:  Take a complete history of the type of bleeding.  Perform a complete physical exam and Pap smear.  Take an ultrasound on the abdomen showing a picture of the female organs and the pelvis.  Inject dye into the uterus and Fallopian tubes and X-ray them (hysterosalpingogram).  Place fluid in the uterus and do an ultrasound (sonohysterogrqphy).  Take a CT scan to examine the female organs and pelvis.  Take an MRI to examine the female organs and pelvis. There is no X-ray involved with this procedure.  Look inside the uterus with a telescope that has a light at the end (hysteroscopy).  Scrap the inside  of the uterus to get tissue to examine (Dilatation and Curettage, D&C).  Look into the pelvis with a telescope that has a light at the end (laparoscopy). This is done through a very small cut (incision) in the abdomen. TREATMENT  Treatment will depend on the cause of the abnormal bleeding. It can include:  Doing nothing to allow the problem to take care of itself over time.  Hormone treatment.  Birth control pills.  Treating the medical condition causing the problem.  Laparoscopy.  Major or minor surgery  Destroying the lining of the uterus with electrical currant, laser, freezing or heat (uterine ablation). HOME CARE INSTRUCTIONS   Follow your caregiver's recommendation on how to treat your problem.  See your caregiver if you missed a menstrual period and think you may be pregnant.  If you are bleeding heavily, count the number of pads/tampons you use and how often you have to change them. Tell this to your caregiver.  Avoid sexual intercourse until the problem is controlled. SEEK MEDICAL CARE IF:   You have any kind of abnormal bleeding mentioned above.  You feel dizzy at times.  You are 42 years old and have not had a menstrual period yet. SEEK IMMEDIATE MEDICAL CARE IF:   You pass out.  You are changing pads/tampons every 15 to 30 minutes.  You have belly (abdominal) pain.  You have a temperature of 100 F (37.8 C) or higher.  You become sweaty or weak.  You are passing large blood clots from the vagina.  You start to feel sick to  your stomach (nauseous) and throw up (vomit). Document Released: 12/02/2005 Document Revised: 02/24/2012 Document Reviewed: 04/27/2009 [facility] Patient Information 2013 [place], [place].

## 2013-04-05 NOTE — Progress Notes (Signed)
Mariah Rice is a 42 y.o. female who presents to [facility] today for period problems  Periods: 2 months of irregular periods. Menarche at age 8. G2P2. Previously periods were every 28 days. Now occuring every 2-3wks. Heavier than normal. Occasional fatigue but now working full-time (improved on celexa). Associated w/ hot flashes. Denies unintentional wt loss. Mother died prior to menopause, at age 38. Maternal aunt went through menopause at 69. More irritability. H/o BTL. Aunt w/ uterine ca. Mother died of cervical ca. Grandmother w/ hysterectomy. Last pap 3+ yrs ago. Denies vaginal discharge, or abdominal pain.   Spot on face: causing irritation to her face. Changing in size and shape. Present for past several years.   Tobacco abuse: 3 cigarettes a day. Using E cig.   The following portions of the patient's history were reviewed and updated as appropriate: allergies, current medications, past medical history, family and social history, and problem list.  Patient is a smoker  Past Medical History  Diagnosis Date  . Depression   . Anxiety   . Dental abscess     ROS as above otherwise neg.    Medications reviewed. Current Outpatient Prescriptions  Medication Sig Dispense Refill  . citalopram (CELEXA) 20 MG tablet Take 1 tablet (20 mg total) by mouth daily.  30 tablet  3  . hydrochlorothiazide (HYDRODIURIL) 25 MG tablet Take 1 tablet (25 mg total) by mouth daily.  30 tablet  3  . hydrOXYzine (ATARAX/VISTARIL) 50 MG tablet Take 50 mg by mouth 3 (three) times daily as needed.      . varenicline (CHANTIX PAK) 0.5 MG X 11 & 1 MG X 42 tablet Take one 0.5 mg tablet by mouth once daily for 3 days, then increase to one 0.5 mg tablet twice daily for 4 days, then increase to one 1 mg tablet twice daily.  53 tablet  0   No current facility-administered medications for this visit.    Exam: BP 122/70  Pulse 88  Temp(Src) 98.3 F (36.8 C) (Oral)  Ht 5\' 4"  (1.626 m)  Wt 194 lb (87.998 kg)  BMI 33.28  kg/m2  LMP 03/28/2013 Gen: Well NAD HEENT: EOMI,  MMM Skin: hyperpigmented lesion of the R  No results found for this or any previous visit (from the past 72 hour(s)).

## 2013-04-05 NOTE — Assessment & Plan Note (Addendum)
Changing skin lesion causing irritation. inflammed SK vs nevi Cryotherapy today Anticipate complete resolution Precautions given

## 2013-05-04 ENCOUNTER — Encounter: Payer: Self-pay | Admitting: Family Medicine

## 2013-05-04 ENCOUNTER — Ambulatory Visit (INDEPENDENT_AMBULATORY_CARE_PROVIDER_SITE_OTHER): Payer: No Typology Code available for payment source | Admitting: Family Medicine

## 2013-05-04 VITALS — BP 136/85 | HR 77 | Temp 98.5°F | Ht 64.0 in | Wt 192.0 lb

## 2013-05-04 DIAGNOSIS — I10 Essential (primary) hypertension: Secondary | ICD-10-CM

## 2013-05-04 DIAGNOSIS — N939 Abnormal uterine and vaginal bleeding, unspecified: Secondary | ICD-10-CM

## 2013-05-04 DIAGNOSIS — F32A Depression, unspecified: Secondary | ICD-10-CM

## 2013-05-04 DIAGNOSIS — F17200 Nicotine dependence, unspecified, uncomplicated: Secondary | ICD-10-CM

## 2013-05-04 DIAGNOSIS — L989 Disorder of the skin and subcutaneous tissue, unspecified: Secondary | ICD-10-CM

## 2013-05-04 DIAGNOSIS — F329 Major depressive disorder, single episode, unspecified: Secondary | ICD-10-CM

## 2013-05-04 DIAGNOSIS — N926 Irregular menstruation, unspecified: Secondary | ICD-10-CM

## 2013-05-04 DIAGNOSIS — Z720 Tobacco use: Secondary | ICD-10-CM

## 2013-05-04 MED ORDER — HYDROCHLOROTHIAZIDE 25 MG PO TABS: 25.0000 mg | ORAL_TABLET | Freq: Every day | ORAL | Status: DC

## 2013-05-04 MED ORDER — CITALOPRAM HYDROBROMIDE 20 MG PO TABS: 20.0000 mg | ORAL_TABLET | Freq: Every day | ORAL | Status: DC

## 2013-05-04 NOTE — Assessment & Plan Note (Signed)
Discussed results w/ pt. PAP nml Yearly PAP for now nml cycle since last appt Possible irregularity secondary to starting celexa Other labs nml

## 2013-05-04 NOTE — Assessment & Plan Note (Signed)
Slowly making progress. Continue E cig

## 2013-05-04 NOTE — Assessment & Plan Note (Signed)
Well-controlled. Continue HCTZ

## 2013-05-04 NOTE — Progress Notes (Signed)
Mariah Rice is a 42 y.o. female who presents to [facility] today for f/u  Depression/Anxiety: feeling well. Taking celexa 20mg .   Tobacco: using Ecig. Everyone else around her in home smokes. Down to 3 per day. No using CHantix. Has not tried.   HTN: Taking HCRZ as prescribed. Denies CP, SOB, HA, palpitations.   Abnormal uterine bleeding: menses regular this past month. 5 days of bleeding. Reviewed pap results w/ pt that were nml.   Facial skin lesion: Much improved. Protecting it from the sun w/ SPF 70 sunblock since treatment w/ liquid nitrogen  The following portions of the patient's history were reviewed and updated as appropriate: allergies, current medications, past medical history, family and social history, and problem list.  Patient is a smoker  Past Medical History  Diagnosis Date  . Depression   . Anxiety   . Dental abscess     ROS as above otherwise neg.    Medications reviewed. Current Outpatient Prescriptions  Medication Sig Dispense Refill  . citalopram (CELEXA) 20 MG tablet Take 1 tablet (20 mg total) by mouth daily.  30 tablet  3  . hydrochlorothiazide (HYDRODIURIL) 25 MG tablet Take 1 tablet (25 mg total) by mouth daily.  30 tablet  3  . hydrOXYzine (ATARAX/VISTARIL) 50 MG tablet Take 50 mg by mouth 3 (three) times daily as needed.      . varenicline (CHANTIX PAK) 0.5 MG X 11 & 1 MG X 42 tablet Take one 0.5 mg tablet by mouth once daily for 3 days, then increase to one 0.5 mg tablet twice daily for 4 days, then increase to one 1 mg tablet twice daily.  53 tablet  0   No current facility-administered medications for this visit.    Exam: BP 136/85  Pulse 77  Temp(Src) 98.5 F (36.9 C) (Oral)  Ht 5\' 4"  (1.626 m)  Wt 192 lb (87.091 kg)  BMI 32.94 kg/m2  LMP 03/28/2013 Gen: Well NAD HEENT: EOMI,  MMM Skin: darkened skin lesion lateral to R eye on upper cheek area that is diminished from previous exam.  No results found for this or any previous visit (from the  past 72 hour(s)).

## 2013-05-04 NOTE — Assessment & Plan Note (Signed)
Much improved. Will likely need one more treatment for complete resolution Will defer until the fall as pt is going to be out in the sun a lot

## 2013-05-04 NOTE — Assessment & Plan Note (Signed)
PHQ 9 score of 4 today Continue Celexa 20mg  No need to increase at this time 6 mo f/u

## 2013-05-04 NOTE — Patient Instructions (Addendum)
Thank you for coming in today You are doing great Please continue taking all of your medciations as prescribed Please come back in 6 months Have a great time at the beach. Please call if you have any questions or concerns.

## 2013-05-24 ENCOUNTER — Telehealth: Payer: Self-pay | Admitting: *Deleted
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
## 2013-06-15 ENCOUNTER — Telehealth: Payer: Self-pay | Admitting: Family Medicine

## 2013-06-15 NOTE — Telephone Encounter (Signed)
Patient is calling about needing a refill on Hydroxine to be sent to [place] at [facility]  [facility] and she hasn't taken it in a long time but lately, her anxiety has gotten worse and she has needed it.  She is getting ready to go out of town and wants to get it filled before she leaves.

## 2013-06-22 MED ORDER — HYDROXYZINE HCL 50 MG PO TABS: 50.0000 mg | ORAL_TABLET | Freq: Three times a day (TID) | ORAL | Status: DC | PRN

## 2013-06-22 NOTE — Telephone Encounter (Signed)
Reviewed chart. OK for pt to have Hydroxyzine. Orders sent to pharmacy No need for appt just for this. May f/u as previously discussed  Shelly Flatten, MD Family Medicine PGY-3 06/22/2013, 10:05 AM

## 2013-06-22 NOTE — Telephone Encounter (Signed)
Pt called and informed that prescription would be waiting at pharmacy and to follow up as directed. Wyatt Haste, RN-BSN

## 2013-06-22 NOTE — Addendum Note (Signed)
Addended by: Konrad Dolores, Tarius Stangelo J on: 06/22/2013 10:06 AM   Modules accepted: Orders

## 2013-06-22 NOTE — Telephone Encounter (Signed)
Pt called and message left that she would need appointment for refill on Hydroxine since it has been removed from med list and it has been some time since she was on it. Please call back at her convenience for appointment. Wyatt Haste, RN-BSN

## 2013-07-21 ENCOUNTER — Other Ambulatory Visit: Payer: Self-pay | Admitting: *Deleted

## 2013-07-21 MED ORDER — HYDROXYZINE HCL 50 MG PO TABS: 50.0000 mg | ORAL_TABLET | Freq: Three times a day (TID) | ORAL | Status: DC | PRN

## 2013-07-26 ENCOUNTER — Encounter: Payer: Self-pay | Admitting: Family Medicine

## 2013-07-26 ENCOUNTER — Ambulatory Visit (INDEPENDENT_AMBULATORY_CARE_PROVIDER_SITE_OTHER): Payer: No Typology Code available for payment source | Admitting: Family Medicine

## 2013-07-26 VITALS — BP 122/68 | HR 74 | Temp 98.8°F | Ht 64.0 in | Wt 190.0 lb

## 2013-07-26 DIAGNOSIS — F17200 Nicotine dependence, unspecified, uncomplicated: Secondary | ICD-10-CM

## 2013-07-26 DIAGNOSIS — F411 Generalized anxiety disorder: Secondary | ICD-10-CM

## 2013-07-26 DIAGNOSIS — F32A Depression, unspecified: Secondary | ICD-10-CM

## 2013-07-26 DIAGNOSIS — I10 Essential (primary) hypertension: Secondary | ICD-10-CM

## 2013-07-26 DIAGNOSIS — R635 Abnormal weight gain: Secondary | ICD-10-CM

## 2013-07-26 DIAGNOSIS — Z720 Tobacco use: Secondary | ICD-10-CM

## 2013-07-26 DIAGNOSIS — F329 Major depressive disorder, single episode, unspecified: Secondary | ICD-10-CM

## 2013-07-26 MED ORDER — CITALOPRAM HYDROBROMIDE 20 MG PO TABS: 40.0000 mg | ORAL_TABLET | Freq: Every day | ORAL | Status: DC

## 2013-07-26 NOTE — Progress Notes (Signed)
Mariah Rice is a 42 y.o. female who presents to [facility] today for f/u for depression and anxiety.  Depression: much improved since starting celexa. Denies HI/SI. Feels well. Happy with life.   Anxiety: unsure of triggers but much worse than previous. Shaky and aggrevated easily. Will wake up feeling this way and persists for days.Symptoms 5-6/7 days out of the week. Cutting back on smoking has  Made a little worse. Denies fevers, szr, palpitations sob, CP. Occasional shakyness. Work not going well and stressful home situation w/ BF. 3 cocacolas in a day. Hydroxyzine 100mg  w/o benefit. Not debilitating to the point of not being able to function.   Hot flashes: sweating, lasts for 20 min. Started 1 month ago. LMP last week. Occuring every 21 days.  Tobacco: 1/3 ppd. And using Ecig.   The following portions of the patient's history were reviewed and updated as appropriate: allergies, current medications, past medical history, family and social history, and problem list.  Patient is a nonsmoker   Past Medical History  Diagnosis Date  . Depression   . Anxiety   . Dental abscess    ROS as above otherwise neg.    Medications reviewed. Current Outpatient Prescriptions  Medication Sig Dispense Refill  . citalopram (CELEXA) 20 MG tablet Take 1 tablet (20 mg total) by mouth daily.  30 tablet  6  . hydrochlorothiazide (HYDRODIURIL) 25 MG tablet Take 1 tablet (25 mg total) by mouth daily.  30 tablet  6  . hydrOXYzine (ATARAX/VISTARIL) 50 MG tablet Take 1 tablet (50 mg total) by mouth 3 (three) times daily as needed for anxiety.  90 tablet  3   No current facility-administered medications for this visit.    Exam: BP 122/68  Pulse 74  Temp(Src) 98.8 F (37.1 C) (Oral)  Ht 5\' 4"  (1.626 m)  Wt 190 lb (86.183 kg)  BMI 32.6 kg/m2 Gen: Well NAD HEENT: EOMI,  MMM Lungs: CTABL Nl WOB Heart: RRR no MRG Abd: NABS, NT, ND Exts: Non edematous BL  LE, warm and well perfused.  Psych: anxious  No  results found for this or any previous visit (from the past 72 hour(s)).  '

## 2013-07-26 NOTE — Assessment & Plan Note (Signed)
Much improved  Score of 2 on PHQ9 Inc citalopram for anxiety

## 2013-07-26 NOTE — Assessment & Plan Note (Signed)
Well-controlled  Cont current therapy

## 2013-07-26 NOTE — Assessment & Plan Note (Signed)
Continues to try to cut back but very stressed today. Encouraged to continue to try to quit

## 2013-07-26 NOTE — Assessment & Plan Note (Signed)
Wt down 2lb today

## 2013-07-26 NOTE — Assessment & Plan Note (Addendum)
Worse today. GAD score of 20/21 Unsure of triggers but stressful home situation, trying to quit smoking, mentsrual changes, stress at work Inc Citalopram to 40.  F/u in 2 wks Cont hydroxyzine and may inc to max of 100mg  Q4 May consider short benzo course at next appt

## 2013-07-26 NOTE — Patient Instructions (Addendum)
Thank you for coming in today Your anxiety is far too high Please increase your celexa to 40mg   Please keep close track of yoru triggers Please come back to see me in 2 weeks. Have a great day.  Come back sooner if needed  Citalopram tablets What is this medicine? CITALOPRAM (sye TAL oh pram) is a medicine for depression. This medicine may be used for other purposes; ask your health care provider or pharmacist if you have questions. What should I tell my health care provider before I take this medicine? They need to know if you have any of these conditions: -bipolar disorder or a family history of bipolar disorder -diabetes -heart disease -history of irregular heartbeat -kidney or liver disease -low levels of magnesium or potassium in the blood -receiving electroconvulsive therapy -seizures (convulsions) -suicidal thoughts or a previous suicide attempt -an unusual or allergic reaction to citalopram, escitalopram, other medicines, foods, dyes, or preservatives -pregnant or trying to become pregnant -breast-feeding How should I use this medicine? Take this medicine by mouth with a glass of water. Follow the directions on the prescription label. You can take it with or without food. Take your medicine at regular intervals. Do not take your medicine more often than directed. Do not stop taking this medicine suddenly except upon the advice of your doctor. Stopping this medicine too quickly may cause serious side effects or your condition may worsen. A special MedGuide will be given to you by the pharmacist with each prescription and refill. Be sure to read this information carefully each time. Talk to your pediatrician regarding the use of this medicine in children. Special care may be needed. Patients over 78 years old may have a stronger reaction and need a smaller dose. Overdosage: If you think you have taken too much of this medicine contact a poison control center or emergency room at  once. NOTE: This medicine is only for you. Do not share this medicine with others. What if I miss a dose? If you miss a dose, take it as soon as you can. If it is almost time for your next dose, take only that dose. Do not take double or extra doses. What may interact with this medicine? Do not take this medicine with any of the following medications: -cisapride -dofetlide -dronedarone -escitalopram -linezolid -MAOIs like Carbex, Eldepryl, Marplan, Nardil, and Parnate -methylene blue (injected into a vein) -pimozide -posaconazole -thioridazine -ziprasidone This medicine may also interact with the following medications: -alcohol -aspirin and aspirin-like medicines -carbamazepine -certain medicines for depression, anxiety, or psychotic disturbances -certain medicines for fungal infections like ketoconazole and itraconazole -certain medicines used to treat infections like chloroquine, clarithromycin, erythromycin, pentamidine -certain medicines for migraine headaches like almotriptan, eletriptan, frovatriptan, naratriptan, rizatriptan, sumatriptan, zolmitriptan -cimetidine -diuretics -fentanyl -furazolidone -isoniazid -lithium -medicines for sleep -medicines that treat or prevent blood clots like warfarin, enoxaparin, and dalteparin -methadone -metoprolol -NSAIDs, medicines for pain and inflammation, like ibuprofen or naproxen -omeprazole -other medicines that prolong the QT interval (cause an abnormal heart rhythm) -procarbazine -rasagiline -supplements like St. John's wort, kava kava, valerian -tramadol -tryptophan This list may not describe all possible interactions. Give your health care provider a list of all the medicines, herbs, non-prescription drugs, or dietary supplements you use. Also tell them if you smoke, drink alcohol, or use illegal drugs. Some items may interact with your medicine. What should I watch for while using this medicine? Tell your doctor if your  symptoms do not get better or if they get worse. Visit  your doctor or health care professional for regular checks on your progress. Because it may take several weeks to see the full effects of this medicine, it is important to continue your treatment as prescribed by your doctor. Patients and their families should watch out for new or worsening thoughts of suicide or depression. Also watch out for sudden changes in feelings such as feeling anxious, agitated, panicky, irritable, hostile, aggressive, impulsive, severely restless, overly excited and hyperactive, or not being able to sleep. If this happens, especially at the beginning of treatment or after a change in dose, call your health care professional. Bonita Quin may get drowsy or dizzy. Do not drive, use machinery, or do anything that needs mental alertness until you know how this medicine affects you. Do not stand or sit up quickly, especially if you are an older patient. This reduces the risk of dizzy or fainting spells. Alcohol may interfere with the effect of this medicine. Avoid alcoholic drinks. Your mouth may get dry. Chewing sugarless gum or sucking hard candy, and drinking plenty of water will help. Contact your doctor if the problem does not go away or is severe. What side effects may I notice from receiving this medicine? Side effects that you should report to your doctor or health care professional as soon as possible: -allergic reactions like skin rash, itching or hives, swelling of the face, lips, or tongue -chest pain -confusion -dizziness -fast, irregular heartbeat -fast talking and excited feelings or actions that are out of control -feeling faint or lightheaded, falls -hallucination, loss of contact with reality -seizures -shortness of breath -suicidal thoughts or other mood changes -unusual bleeding or bruising Side effects that usually do not require medical attention (report to your doctor or health care professional if they  continue or are bothersome): -blurred vision -change in appetite -change in sex drive or performance -headache -increased sweating -nausea -trouble sleeping This list may not describe all possible side effects. Call your doctor for medical advice about side effects. You may report side effects to FDA at 1-800-FDA-1088. Where should I keep my medicine? Keep out of reach of children. Store at room temperature between 15 and 30 degrees C (59 and 86 degrees F). Throw away any unused medicine after the expiration date. NOTE: This sheet is a summary. It may not cover all possible information. If you have questions about this medicine, talk to your doctor, pharmacist, or health care provider.  2013, [employer] Standard. (04/17/2012 7:10:58 PM)

## 2013-08-06 ENCOUNTER — Encounter: Payer: Self-pay | Admitting: Family Medicine

## 2013-08-06 ENCOUNTER — Ambulatory Visit (INDEPENDENT_AMBULATORY_CARE_PROVIDER_SITE_OTHER): Payer: No Typology Code available for payment source | Admitting: Family Medicine

## 2013-08-06 VITALS — BP 131/85 | HR 89 | Temp 98.3°F | Ht 64.0 in | Wt 194.0 lb

## 2013-08-06 DIAGNOSIS — F17200 Nicotine dependence, unspecified, uncomplicated: Secondary | ICD-10-CM

## 2013-08-06 DIAGNOSIS — Z720 Tobacco use: Secondary | ICD-10-CM

## 2013-08-06 DIAGNOSIS — F411 Generalized anxiety disorder: Secondary | ICD-10-CM

## 2013-08-06 DIAGNOSIS — I10 Essential (primary) hypertension: Secondary | ICD-10-CM

## 2013-08-06 MED ORDER — CLONAZEPAM 0.25 MG PO TBDP: 0.2500 mg | ORAL_TABLET | Freq: Two times a day (BID) | ORAL | Status: DC | PRN

## 2013-08-06 NOTE — Assessment & Plan Note (Addendum)
No change since increasing to citalopram 40mg   Continue hydroxizine Pt to continue to work towards managing triggers or changing environment Will Rx. Klonopin (2-3 months at most) Continue to walk Discussed meditation

## 2013-08-06 NOTE — Progress Notes (Signed)
Mariah Rice is a 42 y.o. female who presents to [facility] today for f/u for Anxiety   Anxiety: GAD score 19/21 today. Taking citalopram 40mg . Triggers still include BF and work. Able to sleep better. Depression is much better on the citalopram. Trying to get out and walk more to calm nerves. Hydroxyzine not working. Keeps up at night. "drama at home w/ Bfs mother".   HTN: taking HCTZ. No CP, HA, SOB, palpitations.    The following portions of the patient's history were reviewed and updated as appropriate: allergies, current medications, past medical history, family and social history, and problem list.  Patient is a smoker  Past Medical History  Diagnosis Date  . Depression   . Anxiety   . Dental abscess     ROS as above otherwise neg.    Medications reviewed. Current Outpatient Prescriptions  Medication Sig Dispense Refill  . citalopram (CELEXA) 20 MG tablet Take 2 tablets (40 mg total) by mouth daily.  30 tablet  6  . hydrochlorothiazide (HYDRODIURIL) 25 MG tablet Take 1 tablet (25 mg total) by mouth daily.  30 tablet  6  . hydrOXYzine (ATARAX/VISTARIL) 50 MG tablet Take 1 tablet (50 mg total) by mouth 3 (three) times daily as needed for anxiety.  90 tablet  3   No current facility-administered medications for this visit.    Exam: BP 131/85  Pulse 89  Temp(Src) 98.3 F (36.8 C) (Oral)  Ht 5\' 4"  (1.626 m)  Wt 194 lb (87.998 kg)  BMI 33.28 kg/m2  LMP 07/16/2013 Gen: Well NAD HEENT: EOMI,  MMM Psych: emotional  No results found for this or any previous visit (from the past 72 hour(s)).

## 2013-08-06 NOTE — Assessment & Plan Note (Signed)
Well controlled Continue current therapy 

## 2013-08-06 NOTE — Assessment & Plan Note (Signed)
No change. Continued to discuss cutting back. Not smoking any more than before

## 2013-08-06 NOTE — Patient Instructions (Addendum)
Thank you for coming in today Please start taking the klonopin as needed for anxiety. This will only be for 1-3 months as most Please continue to try to change your home situation and work on your stress relievers Keep up the walking and dog walks Please come back in 3-4 months or sooner if needed  Anxiety and Panic Attacks Your caregiver has informed you that you are having an anxiety or panic attack. There may be many forms of this. Most of the time these attacks come suddenly and without warning. They come at any time of day, including periods of sleep, and at any time of life. They may be strong and unexplained. Although panic attacks are very scary, they are physically harmless. Sometimes the cause of your anxiety is not known. Anxiety is a protective mechanism of the body in its fight or flight mechanism. Most of these perceived danger situations are actually nonphysical situations (such as anxiety over losing a job). CAUSES  The causes of an anxiety or panic attack are many. Panic attacks may occur in otherwise healthy people given a certain set of circumstances. There may be a genetic cause for panic attacks. Some medications may also have anxiety as a side effect. SYMPTOMS  Some of the most common feelings are:  Intense terror.  Dizziness, feeling faint.  Hot and cold flashes.  Fear of going crazy.  Feelings that nothing is real.  Sweating.  Shaking.  Chest pain or a fast heartbeat (palpitations).  Smothering, choking sensations.  Feelings of impending doom and that death is near.  Tingling of extremities, this may be from over-breathing.  Altered reality (derealization).  Being detached from yourself (depersonalization). Several symptoms can be present to make up anxiety or panic attacks. DIAGNOSIS  The evaluation by your caregiver will depend on the type of symptoms you are experiencing. The diagnosis of anxiety or panic attack is made when no physical illness can  be determined to be a cause of the symptoms. TREATMENT  Treatment to prevent anxiety and panic attacks may include:  Avoidance of circumstances that cause anxiety.  Reassurance and relaxation.  Regular exercise.  Relaxation therapies, such as yoga.  Psychotherapy with a psychiatrist or therapist.  Avoidance of caffeine, alcohol and illegal drugs.  Prescribed medication. SEEK IMMEDIATE MEDICAL CARE IF:   You experience panic attack symptoms that are different than your usual symptoms.  You have any worsening or concerning symptoms. Document Released: 12/02/2005 Document Revised: 02/24/2012 Document Reviewed: 04/05/2010 [facility] Patient Information 2014 [place], [place].

## 2013-08-11 ENCOUNTER — Telehealth: Payer: Self-pay | Admitting: Family Medicine

## 2013-08-11 NOTE — Telephone Encounter (Signed)
The patient would like someone to let her know when this has been resolved so that she can know when it is ok to go pick up her Rx.

## 2013-08-11 NOTE — Telephone Encounter (Signed)
Patient needs someone to call [facility] at [phone] and speak to the pharmacist about getting the other half of her Rx of Clonazepam.  When she received the Rx, it was for 60 pills, but she could only afford to get half of them.  So she went to pick up the rest of them today and the pharmacy won't give them to her until they speak to someone from our office.

## 2013-08-11 NOTE — Telephone Encounter (Signed)
Pharmacy called  Wyatt Haste, RN-BSN

## 2013-09-02 ENCOUNTER — Telehealth: Payer: Self-pay | Admitting: Family Medicine

## 2013-09-02 DIAGNOSIS — F411 Generalized anxiety disorder: Secondary | ICD-10-CM

## 2013-09-02 MED ORDER — CLONAZEPAM 0.25 MG PO TBDP: 0.2500 mg | ORAL_TABLET | Freq: Two times a day (BID) | ORAL | Status: DC | PRN

## 2013-09-02 NOTE — Telephone Encounter (Signed)
Pt is aware.  Arch Methot,CMA  

## 2013-09-02 NOTE — Telephone Encounter (Signed)
She is running out of her clodapine.  She runs out Saturday. She would like to get a prescription for it. She would like to pick it up Tomorrow if possible. Please advise

## 2013-09-02 NOTE — Telephone Encounter (Signed)
Will forward to MD for review. Shondrika Hoque,CMA  

## 2013-09-08 ENCOUNTER — Ambulatory Visit (INDEPENDENT_AMBULATORY_CARE_PROVIDER_SITE_OTHER): Payer: No Typology Code available for payment source | Admitting: Family Medicine

## 2013-09-08 ENCOUNTER — Encounter: Payer: Self-pay | Admitting: Family Medicine

## 2013-09-08 VITALS — BP 123/84 | HR 78 | Temp 98.3°F | Wt 190.0 lb

## 2013-09-08 DIAGNOSIS — R635 Abnormal weight gain: Secondary | ICD-10-CM

## 2013-09-08 DIAGNOSIS — I10 Essential (primary) hypertension: Secondary | ICD-10-CM

## 2013-09-08 DIAGNOSIS — Z23 Encounter for immunization: Secondary | ICD-10-CM

## 2013-09-08 DIAGNOSIS — F411 Generalized anxiety disorder: Secondary | ICD-10-CM

## 2013-09-08 DIAGNOSIS — L989 Disorder of the skin and subcutaneous tissue, unspecified: Secondary | ICD-10-CM

## 2013-09-08 DIAGNOSIS — F32A Depression, unspecified: Secondary | ICD-10-CM

## 2013-09-08 DIAGNOSIS — F329 Major depressive disorder, single episode, unspecified: Secondary | ICD-10-CM

## 2013-09-08 MED ORDER — CLONAZEPAM 0.5 MG PO TABS: 0.2500 mg | ORAL_TABLET | Freq: Two times a day (BID) | ORAL | Status: DC | PRN

## 2013-09-08 MED ORDER — BUSPIRONE HCL 5 MG PO TABS: ORAL_TABLET | ORAL | Status: DC

## 2013-09-08 NOTE — Assessment & Plan Note (Signed)
Wt down 4lbs today

## 2013-09-08 NOTE — Assessment & Plan Note (Signed)
Cryotherapy of the R cheek Second treatment today F/u at next appt

## 2013-09-08 NOTE — Addendum Note (Signed)
Addended by: Jone Baseman D on: 09/08/2013 05:19 PM   Modules accepted: Orders

## 2013-09-08 NOTE — Assessment & Plan Note (Signed)
Still anxious. Working on triggers Will refill Klonopin for one more month then no further Will start Buspar 7.5 BID

## 2013-09-08 NOTE — Patient Instructions (Addendum)
You are doing well overall.  CONGRATS on losing weight.  Please try to come off the Klonopin as I will not be able to prescribe this again for you in the near future per our clinic policy Please start the Buspar. Please start with 7.5mg  (1.5 tabs) twice a day for 3 days then in crease to 10mg  (2 tabs) twice a day thereafter Please come back to see me in 1 month.

## 2013-09-08 NOTE — Progress Notes (Signed)
Mariah Rice is a 42 y.o. female who presents to [facility] today for Anxiety  Cheek lesion: improved after cryotherapy. Did not treat over the summer due to sun exposure.   Tobacco: 7-8per day  Anxiety: slightly improved. Triggers include work and boyfriends mother. Trying to find a new job.   Depression: resolved on the citalopram 40.   The following portions of the patient's history were reviewed and updated as appropriate: allergies, current medications, past medical history, family and social history, and problem list.  Patient is a smoker  Past Medical History  Diagnosis Date  . Depression   . Anxiety   . Dental abscess     ROS as above otherwise neg.    Medications reviewed. Current Outpatient Prescriptions  Medication Sig Dispense Refill  . citalopram (CELEXA) 20 MG tablet Take 2 tablets (40 mg total) by mouth daily.  30 tablet  6  . clonazePAM (KLONOPIN) 0.25 MG disintegrating tablet Take 1 tablet (0.25 mg total) by mouth 2 (two) times daily as needed.  60 tablet  0  . hydrochlorothiazide (HYDRODIURIL) 25 MG tablet Take 1 tablet (25 mg total) by mouth daily.  30 tablet  6  . hydrOXYzine (ATARAX/VISTARIL) 50 MG tablet Take 1 tablet (50 mg total) by mouth 3 (three) times daily as needed for anxiety.  90 tablet  3   No current facility-administered medications for this visit.    Exam:  BP 123/84  Pulse 78  Temp(Src) 98.3 F (36.8 C) (Oral)  Wt 190 lb (86.183 kg)  BMI 32.6 kg/m2 Gen: Well NAD HEENT: EOMI,  MMM   No results found for this or any previous visit (from the past 72 hour(s)).   Cryotherapy Procedure Note  Pre-operative Diagnosis: Suspicious lesion (inflammed SK)  Post-operative Diagnosis: Suspicious lesion  Locations: R cheek  Indications: inflammed  Anesthesia: not required   Procedure Details  Patient informed of risks (permanent scarring, infection, light or dark discoloration, bleeding, infection, weakness, numbness and recurrence of the  lesion) and benefits of the procedure and verbal informed consent obtained.  The areas are treated with liquid nitrogen therapy, frozen, allowed to thaw, and treated again. The patient tolerated procedure well.  The patient was instructed on post-op care, warned that there may be blister formation, redness and pain. Recommend OTC analgesia as needed for pain.  Condition: Stable  Complications: none.  Plan: 1. Instructed to keep the area dry and covered for 24-48h and clean thereafter. 2. Warning signs of infection were reviewed.   3. Recommended that the patient use NSAID as needed for pain.  4. Return in 1 month.

## 2013-09-08 NOTE — Assessment & Plan Note (Signed)
Well managed  No change

## 2013-09-08 NOTE — Assessment & Plan Note (Signed)
No change. Citalopram 40 working very well

## 2013-09-22 ENCOUNTER — Emergency Department (HOSPITAL_COMMUNITY)
Admission: EM | Admit: 2013-09-22 | Discharge: 2013-09-22 | Disposition: A | Discharge status: Home / Self Care | Payer: BC Managed Care – PPO | Attending: Emergency Medicine | Admitting: Emergency Medicine

## 2013-09-22 ENCOUNTER — Encounter (HOSPITAL_COMMUNITY): Payer: Self-pay | Admitting: Emergency Medicine

## 2013-09-22 ENCOUNTER — Emergency Department (HOSPITAL_COMMUNITY): Payer: BC Managed Care – PPO

## 2013-09-22 DIAGNOSIS — R079 Chest pain, unspecified: Secondary | ICD-10-CM

## 2013-09-22 DIAGNOSIS — Z880 Allergy status to penicillin: Secondary | ICD-10-CM | POA: Insufficient documentation

## 2013-09-22 DIAGNOSIS — F3289 Other specified depressive episodes: Secondary | ICD-10-CM | POA: Insufficient documentation

## 2013-09-22 DIAGNOSIS — Z8719 Personal history of other diseases of the digestive system: Secondary | ICD-10-CM | POA: Insufficient documentation

## 2013-09-22 DIAGNOSIS — R072 Precordial pain: Secondary | ICD-10-CM | POA: Insufficient documentation

## 2013-09-22 DIAGNOSIS — J069 Acute upper respiratory infection, unspecified: Secondary | ICD-10-CM | POA: Insufficient documentation

## 2013-09-22 DIAGNOSIS — F17200 Nicotine dependence, unspecified, uncomplicated: Secondary | ICD-10-CM | POA: Insufficient documentation

## 2013-09-22 DIAGNOSIS — Z3202 Encounter for pregnancy test, result negative: Secondary | ICD-10-CM | POA: Insufficient documentation

## 2013-09-22 DIAGNOSIS — F411 Generalized anxiety disorder: Secondary | ICD-10-CM | POA: Insufficient documentation

## 2013-09-22 DIAGNOSIS — I10 Essential (primary) hypertension: Secondary | ICD-10-CM | POA: Insufficient documentation

## 2013-09-22 DIAGNOSIS — F329 Major depressive disorder, single episode, unspecified: Secondary | ICD-10-CM | POA: Insufficient documentation

## 2013-09-22 HISTORY — DX: Essential (primary) hypertension: I10

## 2013-09-22 LAB — POCT I-STAT TROPONIN I
Troponin i, poc: 0 ng/mL (ref 0.00–0.08)
Troponin i, poc: 0 ng/mL (ref 0.00–0.08)

## 2013-09-22 LAB — CBC
HCT: 43.1 % (ref 36.0–46.0)
Hemoglobin: 15 g/dL (ref 12.0–15.0)
MCH: 32.1 pg (ref 26.0–34.0)
MCHC: 34.8 g/dL (ref 30.0–36.0)
MCV: 92.1 fL (ref 78.0–100.0)
Platelets: 254 10*3/uL (ref 150–400)
RBC: 4.68 MIL/uL (ref 3.87–5.11)
RDW: 14 % (ref 11.5–15.5)
WBC: 8.7 10*3/uL (ref 4.0–10.5)

## 2013-09-22 LAB — D-DIMER, QUANTITATIVE: D-Dimer, Quant: 0.57 ug/mL-FEU — ABNORMAL HIGH (ref 0.00–0.48)

## 2013-09-22 LAB — BASIC METABOLIC PANEL
BUN: 10 mg/dL (ref 6–23)
CO2: 24 mEq/L (ref 19–32)
Calcium: 9.5 mg/dL (ref 8.4–10.5)
Chloride: 101 mEq/L (ref 96–112)
Creatinine, Ser: 0.76 mg/dL (ref 0.50–1.10)
GFR calc Af Amer: >90 mL/min (ref >90)
GFR calc non Af Amer: >90 mL/min (ref >90)
Glucose, Bld: 92 mg/dL (ref 70–99)
Potassium: 3.4 mEq/L — ABNORMAL LOW (ref 3.5–5.1)
Sodium: 137 mEq/L (ref 135–145)

## 2013-09-22 LAB — POCT PREGNANCY, URINE: Preg Test, Ur: NEGATIVE

## 2013-09-22 MED ORDER — ACETAMINOPHEN 500 MG PO TABS
1000.0000 mg | ORAL_TABLET | Freq: Once | ORAL | Status: AC
  Administered 2013-09-22: 1000 mg via ORAL
  Filled 2013-09-22: qty 2

## 2013-09-22 MED ORDER — IOHEXOL 350 MG/ML SOLN
100.0000 mL | Freq: Once | INTRAVENOUS | Status: AC | PRN
  Administered 2013-09-22: 100 mL via INTRAVENOUS

## 2013-09-22 MED ORDER — KETOROLAC TROMETHAMINE 30 MG/ML IJ SOLN
30.0000 mg | Freq: Once | INTRAMUSCULAR | Status: AC
  Administered 2013-09-22: 30 mg via INTRAVENOUS
  Filled 2013-09-22: qty 1

## 2013-09-22 MED ORDER — NITROGLYCERIN 0.4 MG SL SUBL: 0.4000 mg | SUBLINGUAL_TABLET | SUBLINGUAL | Status: DC | PRN

## 2013-09-22 MED ORDER — MORPHINE SULFATE 4 MG/ML IJ SOLN
4.0000 mg | Freq: Once | INTRAMUSCULAR | Status: AC
  Administered 2013-09-22: 4 mg via INTRAVENOUS
  Filled 2013-09-22: qty 1

## 2013-09-22 MED ORDER — ASPIRIN 81 MG PO CHEW
324.0000 mg | CHEWABLE_TABLET | Freq: Once | ORAL | Status: AC
  Administered 2013-09-22: 324 mg via ORAL
  Filled 2013-09-22: qty 4

## 2013-09-22 NOTE — ED Notes (Signed)
Patient transported to X-ray 

## 2013-09-22 NOTE — ED Notes (Signed)
Patient transported to CT 

## 2013-09-22 NOTE — ED Notes (Signed)
Pt c/o mid sternal CP worse with inspiration x 3 days worse today; pt sts recent cough and pain worse with cough

## 2013-09-22 NOTE — ED Provider Notes (Signed)
CSN: [id number]     Arrival date & time 09/22/13  1138 History   First MD Initiated Contact with Patient 09/22/13 1157     Chief Complaint  Patient presents with  . Chest Pain   (Consider location/radiation/quality/duration/timing/severity/associated sxs/prior Treatment) HPI Comments: 42 year old female presents with one week worth of intermittent sharp chest pain. She states the pain is in the middle of her chest. It lasts for about 10 minutes the time he gets bad. There is nothing that makes it seemed to come and go. She states does seem to be worse with walking around. It is also worse with inspiration. She's recently been getting over an illness, with a cough. She is coughing does also make the chest pain worse. She's never had pain like this before. She does have a history of hypertension and smokes. Per her knowledge she did not have any cardiac disease. She states that currently the pain as a 5/10. She does not have any leg swelling or leg pain.  The history is provided by the patient.    Past Medical History  Diagnosis Date  . Depression   . Anxiety   . Dental abscess   . Hypertension    Past Surgical History  Procedure Laterality Date  . Tubal ligation  2000   Family History  Problem Relation Age of Onset  . Cancer Mother     cervical cancer  . Heart disease Father   . Hypertension Father   . Hypertension Paternal Aunt   . Thyroid disease Maternal Grandmother   . Hypertension Paternal Grandmother   . Heart disease Paternal Grandmother    History  Substance Use Topics  . Smoking status: Current Every Day Smoker -- 0.50 packs/day  . Smokeless tobacco: Not on file     Comment: smoke 7-8 cigs a day  . Alcohol Use: Yes     Comment: occ   OB History   Grav Para Term Preterm Abortions TAB SAB Ect Mult Living                 Review of Systems  Constitutional: Negative for fever, chills and diaphoresis.  Respiratory: Positive for cough and shortness of breath.    Cardiovascular: Positive for chest pain. Negative for leg swelling.  Gastrointestinal: Negative for nausea, vomiting and abdominal pain.  Musculoskeletal: Negative for back pain.  All other systems reviewed and are negative.    Allergies  Penicillins and Sulfa antibiotics  Home Medications   Current Outpatient Rx  Name  Route  Sig  Dispense  Refill  . busPIRone (BUSPAR) 5 MG tablet      Take 1.5 tablets twice a day for 3 days then increase to 2 tablets twice a day   120 tablet   0   . citalopram (CELEXA) 20 MG tablet   Oral   Take 2 tablets (40 mg total) by mouth daily.   30 tablet   6   . clonazePAM (KLONOPIN) 0.5 MG tablet   Oral   Take 0.5 tablets (0.25 mg total) by mouth 2 (two) times daily as needed for anxiety.   30 tablet   0   . hydrochlorothiazide (HYDRODIURIL) 25 MG tablet   Oral   Take 1 tablet (25 mg total) by mouth daily.   30 tablet   6    BP 126/86  Pulse 75  Temp(Src) 97.8 F (36.6 C) (Oral)  Resp 18  Wt 188 lb 12.8 oz (85.639 kg)  BMI 32.39 kg/m2  SpO2  97% Physical Exam  Nursing note and vitals reviewed. Constitutional: She is oriented to person, place, and time. She appears well-developed and well-nourished. No distress.  HENT:  Head: Normocephalic and atraumatic.  Right Ear: External ear normal.  Left Ear: External ear normal.  Nose: Nose normal.  Eyes: Right eye exhibits no discharge. Left eye exhibits no discharge.  Cardiovascular: Normal rate, regular rhythm and normal heart sounds.   Pulmonary/Chest: Effort normal and breath sounds normal. She has no wheezes. She exhibits tenderness.  Abdominal: Soft. There is no tenderness.  Musculoskeletal: She exhibits no edema and no tenderness.  Neurological: She is alert and oriented to person, place, and time.  Skin: Skin is warm and dry.    ED Course  Procedures (including critical care time) Labs Review Labs Reviewed  BASIC METABOLIC PANEL - Abnormal; Notable for the following:     Potassium 3.4 (*)    All other components within normal limits  D-DIMER, QUANTITATIVE - Abnormal; Notable for the following:    D-Dimer, Quant 0.57 (*)    All other components within normal limits  CBC  POCT PREGNANCY, URINE  POCT I-STAT TROPONIN I  POCT I-STAT TROPONIN I    Date: 09/22/2013  Rate: 84  Rhythm: normal sinus rhythm  QRS Axis: normal  Intervals: normal  ST/T Wave abnormalities: normal  Conduction Disutrbances:none  Narrative Interpretation:   Old EKG Reviewed: none available   Imaging Review Dg Chest 2 View  09/22/2013   CLINICAL DATA:  Chest pain, cough  EXAM: CHEST  2 VIEW  COMPARISON:  03/16/2012.  FINDINGS: The heart size and mediastinal contours are within normal limits. Both lungs are clear. The visualized skeletal structures are unremarkable.  IMPRESSION: No active cardiopulmonary disease.   Electronically Signed   By: Natasha Mead M.D.   On: 09/22/2013 13:18   Ct Angio Chest W/cm &/or Wo Cm  09/22/2013   CLINICAL DATA:  Chest pain  EXAM: CT ANGIOGRAPHY CHEST WITH CONTRAST  TECHNIQUE: Multidetector CT imaging of the chest was performed using the standard protocol during bolus administration of intravenous contrast. Multiplanar CT image reconstructions including MIPs were obtained to evaluate the vascular anatomy.  CONTRAST:  OMNIPAQUE IOHEXOL 350 MG/ML SOLN  COMPARISON:  None.  FINDINGS: There are no filling defects in the pulmonary arterial tree to suggest acute pulmonary thromboembolism.  No evidence of aortic dissection or aortic aneurysm.  No abnormal mediastinal adenopathy. No pericardial effusion. Normal-appearing thyroid gland.  Low lung volumes with bibasilar atelectasis.  No acute bony deformity.  Review of the MIP images confirms the above findings.  IMPRESSION: No evidence of acute pulmonary thromboembolism.   Electronically Signed   By: Maryclare Bean M.D.   On: 09/22/2013 16:37    MDM   1. Chest pain   2. Upper respiratory infection    Patient's sx  are atypical, seem more c/w a MSK etiology based on her recent URI with coughing. She has reproducible CP, 2 neg troponins and a normal EKG. Given her pleuritic sx a ddimer was evaluated, was mildly elevated. CT neg for PE or other process. Given her atypical sx and risk factors (smoking, HTN), I feel she is low risk for ACS. Will treat symptomatically and she will f/u with PCP and cardiology as outpatient for outpatient testing.     Audree Camel, MD 09/22/13 2144

## 2013-09-23 ENCOUNTER — Telehealth: Payer: Self-pay | Admitting: Family Medicine

## 2013-09-23 NOTE — Telephone Encounter (Signed)
10/16 is fine

## 2013-09-23 NOTE — Telephone Encounter (Signed)
Pt called because she was seen in the ER 10/8. She was told to do a F/U with her PCP. SHe has an appointment already scheduled 10/16. She wants Dr. Konrad Dolores to look at her results from her ER visit and see if she needs to come in sooner. She was taken out of work until Monday. If she needs to come before 10/16 please let her know. JW

## 2013-09-24 ENCOUNTER — Telehealth: Payer: Self-pay | Admitting: Family Medicine

## 2013-09-24 DIAGNOSIS — F411 Generalized anxiety disorder: Secondary | ICD-10-CM

## 2013-09-24 DIAGNOSIS — F32A Depression, unspecified: Secondary | ICD-10-CM

## 2013-09-24 DIAGNOSIS — F329 Major depressive disorder, single episode, unspecified: Secondary | ICD-10-CM

## 2013-09-24 MED ORDER — CITALOPRAM HYDROBROMIDE 20 MG PO TABS: 40.0000 mg | ORAL_TABLET | Freq: Every day | ORAL | Status: DC

## 2013-09-24 MED ORDER — BUSPIRONE HCL 5 MG PO TABS: ORAL_TABLET | ORAL | Status: DC

## 2013-09-24 MED ORDER — HYDROCHLOROTHIAZIDE 25 MG PO TABS: 25.0000 mg | ORAL_TABLET | Freq: Every day | ORAL | Status: DC

## 2013-09-24 NOTE — Telephone Encounter (Signed)
Spoke to pt and reviewed ED notes.  CP is multifactorial including stress/anxiety, MSK, pleuritic. Worse w/ deep respirations. No resp compromise Ibuprofen 400-800mg  Q6. F/u in clinic on THu w/ me Refill meds today  Shelly Flatten, MD Family Medicine PGY-3 09/24/2013, 12:27 PM

## 2013-09-24 NOTE — Telephone Encounter (Signed)
Patient calls requesting Ibuprofen for her "chest pain" to be called in to [facility] [phone]. Patient states the medicine given at the ED are not working. Please call patient

## 2013-09-30 ENCOUNTER — Encounter: Payer: Self-pay | Admitting: Family Medicine

## 2013-09-30 ENCOUNTER — Ambulatory Visit (INDEPENDENT_AMBULATORY_CARE_PROVIDER_SITE_OTHER): Payer: BC Managed Care – PPO | Admitting: Family Medicine

## 2013-09-30 VITALS — BP 141/90 | HR 75 | Temp 98.6°F | Ht 64.0 in | Wt 191.0 lb

## 2013-09-30 DIAGNOSIS — Z9109 Other allergy status, other than to drugs and biological substances: Secondary | ICD-10-CM

## 2013-09-30 DIAGNOSIS — F411 Generalized anxiety disorder: Secondary | ICD-10-CM

## 2013-09-30 DIAGNOSIS — R079 Chest pain, unspecified: Secondary | ICD-10-CM

## 2013-09-30 DIAGNOSIS — F17200 Nicotine dependence, unspecified, uncomplicated: Secondary | ICD-10-CM

## 2013-09-30 DIAGNOSIS — I10 Essential (primary) hypertension: Secondary | ICD-10-CM

## 2013-09-30 DIAGNOSIS — Z720 Tobacco use: Secondary | ICD-10-CM

## 2013-09-30 DIAGNOSIS — L989 Disorder of the skin and subcutaneous tissue, unspecified: Secondary | ICD-10-CM

## 2013-09-30 MED ORDER — CLONAZEPAM 0.5 MG PO TABS: 0.2500 mg | ORAL_TABLET | Freq: Two times a day (BID) | ORAL | Status: DC | PRN

## 2013-09-30 MED ORDER — IBUPROFEN 600 MG PO TABS: 600.0000 mg | ORAL_TABLET | Freq: Four times a day (QID) | ORAL | Status: DC | PRN

## 2013-09-30 MED ORDER — BUSPIRONE HCL 15 MG PO TABS: ORAL_TABLET | ORAL | Status: DC

## 2013-09-30 MED ORDER — FLUTICASONE PROPIONATE 50 MCG/ACT NA SUSP: 2.0000 | Freq: Every day | NASAL | Status: DC

## 2013-09-30 NOTE — Assessment & Plan Note (Signed)
Much improved. Consider cryo in the future again

## 2013-09-30 NOTE — Patient Instructions (Signed)
Thank you for coming in today Your chest pain is likely from several things inclugin your anxiety, persistent cough, and musculoskeletal inflammation Please start the ibuprofen 600mg  every 6 hours for the next 2 weeks Please start the flonase adn over the counter allergy medicine for the runny nose and cough Please come back to see me in 2 weeks This will be the last refill on the klonopin Please increase your buspar

## 2013-09-30 NOTE — Assessment & Plan Note (Signed)
Recommending OTC allergy meds (zyrtec, claritin, etc) Cont nasal saline FLonase

## 2013-09-30 NOTE — Assessment & Plan Note (Addendum)
Klonipin refill today for the last time. Pt aware of this and will wean down Recommending counseling w/ Dr. Pascal Lux or w/ myself. Return in 2 wks for appt for anx only Increase Buspar to 15 BID Cont celexa.

## 2013-09-30 NOTE — Assessment & Plan Note (Signed)
Elevated today.  Very anxious Monitor for now

## 2013-09-30 NOTE — Assessment & Plan Note (Signed)
MSK/Pleuritic NSAIDs Control of allergy symptoms

## 2013-09-30 NOTE — Assessment & Plan Note (Signed)
Using E cig and made pact w/ firend at work to decrease smoking. Only 1-2 per day

## 2013-09-30 NOTE — Progress Notes (Signed)
Mariah Rice is a 42 y.o. female who presents to [facility] today for anxiety and chest pain  ANxiety: worse than ever. Home issues make is worse as before. Boyfriends mother lives w/ them and takes issue with pt a lot. Plays games on tablet or w/ dog for relief. Buspar w/ a little benefit. High dose atarax w/o benefit. Using Klonipin 2-3x daily. Boyfriend is a heavy alcoholic  CHest pain: 2-3 wks. [facility] n ED and had EKG/CTA performed. No ischemia per ED. Comes on out of nowhere. Constant. Worse w/ deep inspiration. Achy and sharp.   Cough and runny nose for several months. Zyrtec w/ benefit of cough. No fever. Seasonal allergies.   The following portions of the patient's history were reviewed and updated as appropriate: allergies, current medications, past medical history, family and social history, and problem list.  Patient is a smoker.   Past Medical History  Diagnosis Date  . Depression   . Anxiety   . Dental abscess   . Hypertension    ROS as above otherwise neg.    Medications reviewed. Current Outpatient Prescriptions  Medication Sig Dispense Refill  . busPIRone (BUSPAR) 5 MG tablet Take 1.5 tablets twice a day for 3 days then increase to 2 tablets twice a day  120 tablet  6  . citalopram (CELEXA) 20 MG tablet Take 2 tablets (40 mg total) by mouth daily.  30 tablet  6  . clonazePAM (KLONOPIN) 0.5 MG tablet Take 0.5 tablets (0.25 mg total) by mouth 2 (two) times daily as needed for anxiety.  30 tablet  0  . hydrochlorothiazide (HYDRODIURIL) 25 MG tablet Take 1 tablet (25 mg total) by mouth daily.  30 tablet  6   No current facility-administered medications for this visit.    Exam:  BP 141/90  Pulse 75  Temp(Src) 98.6 F (37 C) (Oral)  Ht 5\' 4"  (1.626 m)  Wt 191 lb (86.637 kg)  BMI 32.77 kg/m2  LMP 09/10/2013 Gen: Well NAD HEENT: EOMI,  MMM   No results found for this or any previous visit (from the past 72 hour(s)).

## 2013-10-05 ENCOUNTER — Ambulatory Visit: Payer: Self-pay | Admitting: Family Medicine

## 2013-10-15 ENCOUNTER — Ambulatory Visit (INDEPENDENT_AMBULATORY_CARE_PROVIDER_SITE_OTHER): Payer: BC Managed Care – PPO | Admitting: Family Medicine

## 2013-10-15 VITALS — BP 121/86 | HR 79 | Temp 98.9°F | Ht 64.0 in | Wt 192.0 lb

## 2013-10-15 DIAGNOSIS — I10 Essential (primary) hypertension: Secondary | ICD-10-CM

## 2013-10-15 DIAGNOSIS — R635 Abnormal weight gain: Secondary | ICD-10-CM

## 2013-10-15 DIAGNOSIS — F411 Generalized anxiety disorder: Secondary | ICD-10-CM

## 2013-10-15 DIAGNOSIS — Z720 Tobacco use: Secondary | ICD-10-CM

## 2013-10-15 DIAGNOSIS — F17200 Nicotine dependence, unspecified, uncomplicated: Secondary | ICD-10-CM

## 2013-10-15 NOTE — Assessment & Plan Note (Signed)
Wt stable

## 2013-10-15 NOTE — Progress Notes (Signed)
Mariah Rice is a 42 y.o. female who presents to [facility] today for GAD  GAD: worse than ever. Buspar adn klonipin w/ relief. Celexa 40 mg daily. Stopped atarax due to no benefit.  GAD score today 20/21. Good news - has had job change to different part of company which will be less stressful. Things at home improving but BF still drinking heavily. Things w/ BF mother improving. Triggers are people at work who "bother" her. Uses games on phone to cope. Walk the dog for relief. Using 3 klonopin daily.   The following portions of the patient's history were reviewed and updated as appropriate: allergies, current medications, past medical history, family and social history, and problem list.  Patient is a 1/2ppd smoker.  Past Medical History  Diagnosis Date  . Depression   . Anxiety   . Dental abscess   . Hypertension     ROS as above otherwise neg.    Medications reviewed. Current Outpatient Prescriptions  Medication Sig Dispense Refill  . busPIRone (BUSPAR) 15 MG tablet Take 1 twice a day  60 tablet  6  . citalopram (CELEXA) 20 MG tablet Take 2 tablets (40 mg total) by mouth daily.  30 tablet  6  . clonazePAM (KLONOPIN) 0.5 MG tablet Take 0.5 tablets (0.25 mg total) by mouth 2 (two) times daily as needed for anxiety.  45 tablet  0  . fluticasone (FLONASE) 50 MCG/ACT nasal spray Place 2 sprays into the nose daily.  16 g  6  . hydrochlorothiazide (HYDRODIURIL) 25 MG tablet Take 1 tablet (25 mg total) by mouth daily.  30 tablet  6  . ibuprofen (ADVIL,MOTRIN) 600 MG tablet Take 1 tablet (600 mg total) by mouth every 6 (six) hours as needed for pain.  30 tablet  3   No current facility-administered medications for this visit.    Exam: BP 121/86  Pulse 79  Temp(Src) 98.9 F (37.2 C) (Oral)  Ht 5\' 4"  (1.626 m)  Wt 192 lb (87.091 kg)  BMI 32.94 kg/m2  LMP 09/10/2013 Gen: Well NAD HEENT: EOMI,  MMM   No results found for this or any previous visit (from the past 72 hour(s)).

## 2013-10-15 NOTE — Patient Instructions (Signed)
THank you for coming in today You are doing well overall Please try really hard this weekend to take time for yourself Please keep a journal of what your triggers are, what you do to cope with your anxiety, and what medicine you take.  Please take time especially every morning and evening to reflect on the day and think of the positive. Please come back to see me on Monday Anxiety and Panic Attacks Your caregiver has informed you that you are having an anxiety or panic attack. There may be many forms of this. Most of the time these attacks come suddenly and without warning. They come at any time of day, including periods of sleep, and at any time of life. They may be strong and unexplained. Although panic attacks are very scary, they are physically harmless. Sometimes the cause of your anxiety is not known. Anxiety is a protective mechanism of the body in its fight or flight mechanism. Most of these perceived danger situations are actually nonphysical situations (such as anxiety over losing a job). CAUSES  The causes of an anxiety or panic attack are many. Panic attacks may occur in otherwise healthy people given a certain set of circumstances. There may be a genetic cause for panic attacks. Some medications may also have anxiety as a side effect. SYMPTOMS  Some of the most common feelings are:  Intense terror.  Dizziness, feeling faint.  Hot and cold flashes.  Fear of going crazy.  Feelings that nothing is real.  Sweating.  Shaking.  Chest pain or a fast heartbeat (palpitations).  Smothering, choking sensations.  Feelings of impending doom and that death is near.  Tingling of extremities, this may be from over-breathing.  Altered reality (derealization).  Being detached from yourself (depersonalization). Several symptoms can be present to make up anxiety or panic attacks. DIAGNOSIS  The evaluation by your caregiver will depend on the type of symptoms you are experiencing. The  diagnosis of anxiety or panic attack is made when no physical illness can be determined to be a cause of the symptoms. TREATMENT  Treatment to prevent anxiety and panic attacks may include:  Avoidance of circumstances that cause anxiety.  Reassurance and relaxation.  Regular exercise.  Relaxation therapies, such as yoga.  Psychotherapy with a psychiatrist or therapist.  Avoidance of caffeine, alcohol and illegal drugs.  Prescribed medication. SEEK IMMEDIATE MEDICAL CARE IF:   You experience panic attack symptoms that are different than your usual symptoms.  You have any worsening or concerning symptoms. Document Released: 12/02/2005 Document Revised: 02/24/2012 Document Reviewed: 04/05/2010 [facility] Patient Information 2014 [place], [place].

## 2013-10-15 NOTE — Assessment & Plan Note (Signed)
Stable.  Difficult to quit during anxiety flare

## 2013-10-15 NOTE — Assessment & Plan Note (Addendum)
No changes to meds today  Will consider Xanax in the future for short term flare.  Spent considerable time promoting not medicinal interventions Pt to spend considerable time focusing on what makes her happy, and on reflecting on what is good in her life Pt to write down thoughts, meds, episodes to review when she comes in on Monday Expect some improvement with change in work team Pt on max celexa, and buspar w/ improvement GAD 20/21 Spent >25min in direct pt care/counseling

## 2013-10-15 NOTE — Assessment & Plan Note (Signed)
Well controlled.  No change today

## 2013-10-18 ENCOUNTER — Ambulatory Visit (INDEPENDENT_AMBULATORY_CARE_PROVIDER_SITE_OTHER): Payer: BC Managed Care – PPO | Admitting: Family Medicine

## 2013-10-18 ENCOUNTER — Encounter: Payer: Self-pay | Admitting: Family Medicine

## 2013-10-18 VITALS — BP 125/88 | HR 80 | Temp 98.1°F | Ht 64.0 in | Wt 191.0 lb

## 2013-10-18 DIAGNOSIS — I10 Essential (primary) hypertension: Secondary | ICD-10-CM

## 2013-10-18 DIAGNOSIS — F411 Generalized anxiety disorder: Secondary | ICD-10-CM

## 2013-10-18 MED ORDER — ALPRAZOLAM 0.5 MG PO TABS: 0.2500 mg | ORAL_TABLET | Freq: Two times a day (BID) | ORAL | Status: DC | PRN

## 2013-10-18 NOTE — Assessment & Plan Note (Signed)
At goal.  No change 

## 2013-10-18 NOTE — Assessment & Plan Note (Signed)
Spent >25 min today in counseling Pt to DC Klonopin. Will give low dose Xanax today for 2 wks. Will need f/u prior to refill.  Will not make this a long-term medication Pt to bring in journal and to discuss progress in order to continue medicaitons.

## 2013-10-18 NOTE — Progress Notes (Signed)
Mariah Rice is a 42 y.o. female who presents to [facility] today for anxiety f/u  Pt very tearful and anxious today. Pt just found out that her job was going to decrease her hours to part time. She has significant financial burdens. Has started compiling list of positives in life adn what she likes to do to relieve stress. Pt still very aggrevated by things other people do. Applying for new job. Takes great satisfaction in spending time w/ dog and boyfriend when he is not being a "jerk".  No HS degree.  Still paying child support. Very proud of daughter who is in school.   The following portions of the patient's history were reviewed and updated as appropriate: allergies, current medications, past medical history, family and social history, and problem list.  Patient is a smoker  Past Medical History  Diagnosis Date  . Depression   . Anxiety   . Dental abscess   . Hypertension     ROS as above otherwise neg.    Medications reviewed. Current Outpatient Prescriptions  Medication Sig Dispense Refill  . busPIRone (BUSPAR) 15 MG tablet Take 1 twice a day  60 tablet  6  . citalopram (CELEXA) 20 MG tablet Take 2 tablets (40 mg total) by mouth daily.  30 tablet  6  . clonazePAM (KLONOPIN) 0.5 MG tablet Take 0.5 tablets (0.25 mg total) by mouth 2 (two) times daily as needed for anxiety.  45 tablet  0  . fluticasone (FLONASE) 50 MCG/ACT nasal spray Place 2 sprays into the nose daily.  16 g  6  . hydrochlorothiazide (HYDRODIURIL) 25 MG tablet Take 1 tablet (25 mg total) by mouth daily.  30 tablet  6  . ibuprofen (ADVIL,MOTRIN) 600 MG tablet Take 1 tablet (600 mg total) by mouth every 6 (six) hours as needed for pain.  30 tablet  3   No current facility-administered medications for this visit.    Exam: BP 125/88  Pulse 80  Temp(Src) 98.1 F (36.7 C) (Oral)  Ht 5\' 4"  (1.626 m)  Wt 191 lb (86.637 kg)  BMI 32.77 kg/m2  LMP 10/05/2013 Gen: Well NAD HEENT: EOMI,  MMM   No results found for  this or any previous visit (from the past 72 hour(s)).

## 2013-10-18 NOTE — Patient Instructions (Signed)
You are doing well overall. I'm sorry to hear about your job. Please apply to several new jobs. Also look into what it would take to get your GED. Please only take the Xanax as needed. This will not be a long term medicine Please continue to write down your triggers, the things that make you happy, and what you are doing to cope with your current anxiety/trials.  Please come back to see me in 2 weeks

## 2013-10-21 ENCOUNTER — Other Ambulatory Visit: Payer: Self-pay

## 2013-11-01 ENCOUNTER — Ambulatory Visit (INDEPENDENT_AMBULATORY_CARE_PROVIDER_SITE_OTHER): Payer: BC Managed Care – PPO | Admitting: Family Medicine

## 2013-11-01 VITALS — BP 129/86 | HR 79 | Temp 98.7°F | Wt 193.0 lb

## 2013-11-01 DIAGNOSIS — I10 Essential (primary) hypertension: Secondary | ICD-10-CM

## 2013-11-01 DIAGNOSIS — R079 Chest pain, unspecified: Secondary | ICD-10-CM

## 2013-11-01 DIAGNOSIS — F411 Generalized anxiety disorder: Secondary | ICD-10-CM

## 2013-11-01 MED ORDER — ALPRAZOLAM 0.5 MG PO TABS: 0.2500 mg | ORAL_TABLET | Freq: Two times a day (BID) | ORAL | Status: DC | PRN

## 2013-11-01 MED ORDER — IBUPROFEN 600 MG PO TABS: 600.0000 mg | ORAL_TABLET | Freq: Four times a day (QID) | ORAL | Status: DC | PRN

## 2013-11-01 NOTE — Progress Notes (Signed)
Mariah Rice is a 42 y.o. female who presents to [facility] today for anxiety  Anxiety: doing nearly 100% better. Medicine helping. Going outside "taking a breather" more often. Now keepin to self at work. Boyfriend gone for weekend and had a better weekend. Still taking buspar and celexa. Xanax BID. Applying for new job. Also applying to change to different facility. Trying to get into CNA program.    HTN: taking Meds as prescribed  The following portions of the patient's history were reviewed and updated as appropriate: allergies, current medications, past medical history, family and social history, and problem list.  Patient is a smoker  Past Medical History  Diagnosis Date  . Depression   . Anxiety   . Dental abscess   . Hypertension     ROS as above otherwise neg.    Medications reviewed. Current Outpatient Prescriptions  Medication Sig Dispense Refill  . ALPRAZolam (XANAX) 0.5 MG tablet Take 0.5 tablets (0.25 mg total) by mouth 2 (two) times daily as needed for sleep or anxiety.  15 tablet  0  . busPIRone (BUSPAR) 15 MG tablet Take 1 twice a day  60 tablet  6  . citalopram (CELEXA) 20 MG tablet Take 2 tablets (40 mg total) by mouth daily.  30 tablet  6  . fluticasone (FLONASE) 50 MCG/ACT nasal spray Place 2 sprays into the nose daily.  16 g  6  . hydrochlorothiazide (HYDRODIURIL) 25 MG tablet Take 1 tablet (25 mg total) by mouth daily.  30 tablet  6  . ibuprofen (ADVIL,MOTRIN) 600 MG tablet Take 1 tablet (600 mg total) by mouth every 6 (six) hours as needed for pain.  30 tablet  3   No current facility-administered medications for this visit.    Exam: BP 129/86  Pulse 79  Temp(Src) 98.7 F (37.1 C) (Oral)  Wt 193 lb (87.544 kg)  LMP 11/01/2013 Gen: Well NAD   No results found for this or any previous visit (from the past 72 hour(s)).

## 2013-11-01 NOTE — Assessment & Plan Note (Addendum)
Doing significantly better.  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

## 2013-11-01 NOTE — Patient Instructions (Signed)
You are doing great [place] job on taking time for yourself and limiting your triggers Please use the xanax only as needed and come off the medicine slowly. There will be no further refills on the xanax Come back to see me in 6-8 weeks  I sent your ibuprofen to the pharmacy Have a wonderful Thanksgiving, and Christmas if I don't see you

## 2013-11-01 NOTE — Assessment & Plan Note (Signed)
No change 

## 2013-11-19 ENCOUNTER — Encounter: Payer: Self-pay | Admitting: Family Medicine

## 2013-11-19 ENCOUNTER — Telehealth: Payer: Self-pay | Admitting: Family Medicine

## 2013-11-19 ENCOUNTER — Ambulatory Visit (INDEPENDENT_AMBULATORY_CARE_PROVIDER_SITE_OTHER): Payer: BC Managed Care – PPO | Admitting: Family Medicine

## 2013-11-19 VITALS — BP 138/94 | HR 63 | Temp 98.6°F | Ht 64.0 in | Wt 193.0 lb

## 2013-11-19 DIAGNOSIS — F17200 Nicotine dependence, unspecified, uncomplicated: Secondary | ICD-10-CM

## 2013-11-19 DIAGNOSIS — Z720 Tobacco use: Secondary | ICD-10-CM

## 2013-11-19 DIAGNOSIS — F411 Generalized anxiety disorder: Secondary | ICD-10-CM

## 2013-11-19 MED ORDER — ALPRAZOLAM 0.5 MG PO TABS: 0.2500 mg | ORAL_TABLET | Freq: Two times a day (BID) | ORAL | Status: DC | PRN

## 2013-11-19 NOTE — Assessment & Plan Note (Addendum)
Lengthy discussion had w/ Mariah Rice regarding appropriateness of Benzo use . Pt would like to come off medication  Decision made that there will be no further refills. Enough given to get pt to 01/04/14 (day BF can go back to work) Pt pushed to find other coping methods Pt to come to nutrition clinic and anticipate diet and exercise will help.  Spent >64min in direct pt care GAD 7 score of 19/21

## 2013-11-19 NOTE — Telephone Encounter (Signed)
Pt asked to speak with me before leaving.  She states that she is "very stressed between my father, my boyfriend and work.  I have been taking 1 pill 2 times a day instead of 0.5 pills two times a day."  Pt is visibly shaking and having trouble concentrating.  MD paged. Fleeger, [name]

## 2013-11-19 NOTE — Progress Notes (Signed)
Mariah Rice is a 42 y.o. female who presents to [facility] today for Anxiety   Axiety significantly worse. Ebonee helps take care of him. Suffered fall. Boyfriend broke foot. Unable to get time to self. Still waiting to hear back on job change. Increased from 0.5 Xanax to once a day to 1 Xanax BID.   The following portions of the patient's history were reviewed and updated as appropriate: allergies, current medications, past medical history, family and social history, and problem list. Patient is a smoker.  Past Medical History  Diagnosis Date  . Depression   . Anxiety   . Dental abscess   . Hypertension    ROS as above otherwise neg.    Medications reviewed. Current Outpatient Prescriptions  Medication Sig Dispense Refill  . ALPRAZolam (XANAX) 0.5 MG tablet Take 0.5 tablets (0.25 mg total) by mouth 2 (two) times daily as needed for sleep or anxiety.  30 tablet  0  . busPIRone (BUSPAR) 15 MG tablet Take 1 twice a day  60 tablet  6  . citalopram (CELEXA) 20 MG tablet Take 2 tablets (40 mg total) by mouth daily.  30 tablet  6  . fluticasone (FLONASE) 50 MCG/ACT nasal spray Place 2 sprays into the nose daily.  16 g  6  . hydrochlorothiazide (HYDRODIURIL) 25 MG tablet Take 1 tablet (25 mg total) by mouth daily.  30 tablet  6  . ibuprofen (ADVIL,MOTRIN) 600 MG tablet Take 1 tablet (600 mg total) by mouth every 6 (six) hours as needed.  30 tablet  3   No current facility-administered medications for this visit.    Exam:  BP 138/94  Pulse 63  Temp(Src) 98.6 F (37 C) (Oral)  Ht 5\' 4"  (1.626 m)  Wt 193 lb (87.544 kg)  BMI 33.11 kg/m2  LMP 11/01/2013 Gen: Well NAD HEENT: EOMI,  MMM   No results found for this or any previous visit (from the past 72 hour(s)).  A/P (as seen in Problem list)  No problem-specific assessment & plan notes found for this encounter.

## 2013-11-19 NOTE — Assessment & Plan Note (Signed)
Continues to smoke. Very stressed

## 2013-11-19 NOTE — Telephone Encounter (Signed)
Spoke with Mariah Rice, she will come in for a SDA this afternoon with Konrad Dolores. [name], [name]

## 2013-11-19 NOTE — Patient Instructions (Signed)
Psalms, you are doing well overall You have a lot of stressors.  Please continue to work on your coping methods as that is the only long term "medication" that truly works for anxiety There will be no further refills after 01/04/14 One thing that will help is healthy living, so make sure to come to that appointment next wieek with myself and Dr. Gerilyn Pilgrim. Have a MERRY CHRISTMAS and HAPPY NEW YEAR!

## 2013-11-19 NOTE — Telephone Encounter (Signed)
Patient is needing a refill of Xanax.  She only has 3 left and she needs it before the weekend.

## 2013-11-19 NOTE — Telephone Encounter (Signed)
MD paged. Mariah Rice, Mariah Rice

## 2013-11-25 ENCOUNTER — Ambulatory Visit: Payer: BC Managed Care – PPO | Admitting: Family Medicine

## 2013-12-14 ENCOUNTER — Ambulatory Visit (INDEPENDENT_AMBULATORY_CARE_PROVIDER_SITE_OTHER): Payer: BC Managed Care – PPO | Admitting: Family Medicine

## 2013-12-14 ENCOUNTER — Encounter: Payer: Self-pay | Admitting: Family Medicine

## 2013-12-14 VITALS — BP 138/82 | HR 73 | Temp 99.1°F | Ht 64.0 in | Wt 199.0 lb

## 2013-12-14 DIAGNOSIS — F411 Generalized anxiety disorder: Secondary | ICD-10-CM

## 2013-12-14 DIAGNOSIS — Z23 Encounter for immunization: Secondary | ICD-10-CM

## 2013-12-14 DIAGNOSIS — R079 Chest pain, unspecified: Secondary | ICD-10-CM

## 2013-12-14 DIAGNOSIS — F32A Depression, unspecified: Secondary | ICD-10-CM

## 2013-12-14 DIAGNOSIS — Z720 Tobacco use: Secondary | ICD-10-CM

## 2013-12-14 DIAGNOSIS — F17200 Nicotine dependence, unspecified, uncomplicated: Secondary | ICD-10-CM

## 2013-12-14 DIAGNOSIS — F329 Major depressive disorder, single episode, unspecified: Secondary | ICD-10-CM

## 2013-12-14 DIAGNOSIS — Z9109 Other allergy status, other than to drugs and biological substances: Secondary | ICD-10-CM

## 2013-12-14 DIAGNOSIS — I10 Essential (primary) hypertension: Secondary | ICD-10-CM

## 2013-12-14 MED ORDER — BUSPIRONE HCL 15 MG PO TABS: ORAL_TABLET | ORAL | Status: DC

## 2013-12-14 MED ORDER — FLUTICASONE PROPIONATE 50 MCG/ACT NA SUSP: 2.0000 | Freq: Every day | NASAL | Status: DC

## 2013-12-14 MED ORDER — IBUPROFEN 600 MG PO TABS: 600.0000 mg | ORAL_TABLET | Freq: Four times a day (QID) | ORAL | Status: DC | PRN

## 2013-12-14 MED ORDER — HYDROCHLOROTHIAZIDE 25 MG PO TABS: 25.0000 mg | ORAL_TABLET | Freq: Every day | ORAL | Status: DC

## 2013-12-14 MED ORDER — CITALOPRAM HYDROBROMIDE 20 MG PO TABS: 40.0000 mg | ORAL_TABLET | Freq: Every day | ORAL | Status: DC

## 2013-12-14 NOTE — Assessment & Plan Note (Signed)
Well controlled. No change. 

## 2013-12-14 NOTE — Assessment & Plan Note (Signed)
Cutting back to 1/3 pack. Continue to wean

## 2013-12-14 NOTE — Progress Notes (Signed)
Mariah Rice is a 42 y.o. female who presents to [facility] today for Anxiety.  Stress/anxiety: still stressed. Walking w/ benefit. Coping better. "dealing with it." using xanax 0.5 mg BID only about 3-4 days a week. Trying not to "overdue it." feeling better overall.   Tobacco: decreased to 7-8 cigs per day.  The following portions of the patient's history were reviewed and updated as appropriate: allergies, current medications, past medical history, family and social history, and problem list.  Health Maintenance: TDAP today  Past Medical History  Diagnosis Date  . Depression   . Anxiety   . Dental abscess   . Hypertension     ROS as above otherwise neg.    Medications reviewed. Current Outpatient Prescriptions  Medication Sig Dispense Refill  . ALPRAZolam (XANAX) 0.5 MG tablet Take 0.5 tablets (0.25 mg total) by mouth 2 (two) times daily as needed for sleep or anxiety.  48 tablet  0  . busPIRone (BUSPAR) 15 MG tablet Take 1 twice a day  60 tablet  6  . citalopram (CELEXA) 20 MG tablet Take 2 tablets (40 mg total) by mouth daily.  30 tablet  6  . fluticasone (FLONASE) 50 MCG/ACT nasal spray Place 2 sprays into both nostrils daily.  16 g  6  . hydrochlorothiazide (HYDRODIURIL) 25 MG tablet Take 1 tablet (25 mg total) by mouth daily.  30 tablet  6  . ibuprofen (ADVIL,MOTRIN) 600 MG tablet Take 1 tablet (600 mg total) by mouth every 6 (six) hours as needed.  30 tablet  3   No current facility-administered medications for this visit.    Exam:  BP 138/82  Pulse 73  Temp(Src) 99.1 F (37.3 C) (Oral)  Ht 5\' 4"  (1.626 m)  Wt 199 lb (90.266 kg)  BMI 34.14 kg/m2 Gen: Well NAD HEENT: EOMI,  MMM   No results found for this or any previous visit (from the past 72 hour(s)).  A/P (as seen in Problem list)  Tobacco abuse Cutting back to 1/3 pack. Continue to wean  GAD (generalized anxiety disorder) Much improved.  No further refills on chronic xanax. Only PRN Coping methods  improved significantly Cont celexa  Hypertension Well controlled. No change

## 2013-12-14 NOTE — Patient Instructions (Signed)
Thank you for coming in today. Please come back in 1-3 months to follow up for your anxiety, blood pressure, and smoking Have a great New Year.

## 2013-12-14 NOTE — Addendum Note (Signed)
Addended by: Jone Baseman D on: 12/14/2013 02:01 PM   Modules accepted: Orders

## 2013-12-14 NOTE — Assessment & Plan Note (Signed)
Much improved.  No further refills on chronic xanax. Only PRN Coping methods improved significantly Cont celexa

## 2013-12-29 ENCOUNTER — Ambulatory Visit: Payer: BC Managed Care – PPO | Admitting: Family Medicine

## 2014-01-03 ENCOUNTER — Encounter: Payer: Self-pay | Admitting: Family Medicine

## 2014-01-03 ENCOUNTER — Ambulatory Visit (INDEPENDENT_AMBULATORY_CARE_PROVIDER_SITE_OTHER): Payer: BC Managed Care – PPO | Admitting: Family Medicine

## 2014-01-03 VITALS — BP 122/74 | HR 75 | Temp 98.8°F | Resp 18 | Wt 195.0 lb

## 2014-01-03 DIAGNOSIS — F32A Depression, unspecified: Secondary | ICD-10-CM

## 2014-01-03 DIAGNOSIS — E669 Obesity, unspecified: Secondary | ICD-10-CM

## 2014-01-03 DIAGNOSIS — F329 Major depressive disorder, single episode, unspecified: Secondary | ICD-10-CM

## 2014-01-03 DIAGNOSIS — F411 Generalized anxiety disorder: Secondary | ICD-10-CM

## 2014-01-03 DIAGNOSIS — F3289 Other specified depressive episodes: Secondary | ICD-10-CM

## 2014-01-03 DIAGNOSIS — R079 Chest pain, unspecified: Secondary | ICD-10-CM

## 2014-01-03 DIAGNOSIS — I10 Essential (primary) hypertension: Secondary | ICD-10-CM

## 2014-01-03 MED ORDER — IBUPROFEN 600 MG PO TABS: 600.0000 mg | ORAL_TABLET | Freq: Four times a day (QID) | ORAL | Status: DC | PRN

## 2014-01-03 MED ORDER — ALPRAZOLAM 0.5 MG PO TABS: 0.2500 mg | ORAL_TABLET | Freq: Two times a day (BID) | ORAL | Status: DC | PRN

## 2014-01-03 NOTE — Assessment & Plan Note (Signed)
CMET, Lipids, A1c prior to next appt

## 2014-01-03 NOTE — Assessment & Plan Note (Signed)
Well controlled. No changes. 

## 2014-01-03 NOTE — Assessment & Plan Note (Signed)
No change. At goal  

## 2014-01-03 NOTE — Patient Instructions (Signed)
Come in at your convenience for fasting blood work Please continue to focus on what makes you happy and try to control your anxiety without the xanax We will consider adding additional medication next time if needed I'm hoping the new job works out for you Please come in for fasting blood work at your convenience Please come back to see me in 4-8 weeks

## 2014-01-03 NOTE — Progress Notes (Signed)
Mariah Rice is a 43 y.o. female who presents to [facility] today for depression and anxiety.  Depression: much improved. Taking Celexa  Anxiety: fired from job. New job application in at better facility. Father's health worsening (end stage COPD). COping well w/ keeping self busy and spending time on and for self.  Wt up today  HTN: Denies CP, syncope, HA, palpitations, SOB  TObacco: tobacco use up to 10 per day   The following portions of the patient's history were reviewed and updated as appropriate: allergies, current medications, past medical history, family and social history, and problem list.  Patient is a smoker   Past Medical History  Diagnosis Date  . Depression   . Anxiety   . Dental abscess   . Hypertension     ROS as above otherwise neg.    Medications reviewed. Current Outpatient Prescriptions  Medication Sig Dispense Refill  . ALPRAZolam (XANAX) 0.5 MG tablet Take 0.5 tablets (0.25 mg total) by mouth 2 (two) times daily as needed for sleep or anxiety.  25 tablet  0  . busPIRone (BUSPAR) 15 MG tablet Take 1 twice a day  60 tablet  6  . citalopram (CELEXA) 20 MG tablet Take 2 tablets (40 mg total) by mouth daily.  30 tablet  6  . fluticasone (FLONASE) 50 MCG/ACT nasal spray Place 2 sprays into both nostrils daily.  16 g  6  . hydrochlorothiazide (HYDRODIURIL) 25 MG tablet Take 1 tablet (25 mg total) by mouth daily.  30 tablet  6  . ibuprofen (ADVIL,MOTRIN) 600 MG tablet Take 1 tablet (600 mg total) by mouth every 6 (six) hours as needed.  60 tablet  3   No current facility-administered medications for this visit.    Exam:  BP 122/74  Pulse 75  Temp(Src) 98.8 F (37.1 C) (Oral)  Resp 18  Wt 195 lb (88.451 kg)  SpO2 97%  LMP 12/27/2013 Gen: Well NAD HEENT: EOMI,  MMM   No results found for this or any previous visit (from the past 22 hour(s)).  A/P (as seen in Problem list)  Hypertension No change At goal  Obesity CMET, Lipids, A1c prior to next  appt  GAD (generalized anxiety disorder) Pt lost job On Benzo since 9/24  Previously improved and weaning off but resumed after losing job Counseled pt on correctness of using benzo for only short time and need to wean off and focus on other methods for managing str3ess and anxiety Refill xanax at only 1/2 previous prescription.  Consider inc celexa at next visit if needed   Depression Well controlled No changes

## 2014-01-03 NOTE — Assessment & Plan Note (Signed)
Pt lost job On Benzo since 9/24  Previously improved and weaning off but resumed after losing job Counseled pt on correctness of using benzo for only short time and need to wean off and focus on other methods for managing str3ess and anxiety Refill xanax at only 1/2 previous prescription.  Consider inc celexa at next visit if needed

## 2014-01-05 ENCOUNTER — Other Ambulatory Visit (INDEPENDENT_AMBULATORY_CARE_PROVIDER_SITE_OTHER): Payer: BC Managed Care – PPO

## 2014-01-05 DIAGNOSIS — E669 Obesity, unspecified: Secondary | ICD-10-CM

## 2014-01-05 LAB — COMPREHENSIVE METABOLIC PANEL
ALBUMIN: 4 g/dL (ref 3.5–5.2)
ALK PHOS: 61 U/L (ref 39–117)
ALT: 16 U/L (ref 0–35)
AST: 13 U/L (ref 0–37)
BUN: 11 mg/dL (ref 6–23)
CALCIUM: 9 mg/dL (ref 8.4–10.5)
CHLORIDE: 104 meq/L (ref 96–112)
CO2: 29 meq/L (ref 19–32)
Creat: 0.76 mg/dL (ref 0.50–1.10)
GLUCOSE: 87 mg/dL (ref 70–99)
POTASSIUM: 3.7 meq/L (ref 3.5–5.3)
SODIUM: 138 meq/L (ref 135–145)
Total Bilirubin: 0.3 mg/dL (ref 0.3–1.2)
Total Protein: 6.5 g/dL (ref 6.0–8.3)

## 2014-01-05 LAB — LIPID PANEL
Cholesterol: 219 mg/dL — ABNORMAL HIGH (ref 0–200)
HDL: 39 mg/dL — AB (ref >39)
LDL Cholesterol: 143 mg/dL — ABNORMAL HIGH (ref 0–99)
Total CHOL/HDL Ratio: 5.6 Ratio
Triglycerides: 186 mg/dL — ABNORMAL HIGH (ref <150)
VLDL: 37 mg/dL (ref 0–40)

## 2014-01-05 LAB — POCT GLYCOSYLATED HEMOGLOBIN (HGB A1C): Hemoglobin A1C: 5.4

## 2014-01-05 NOTE — Progress Notes (Signed)
Drew fasting CMET, LIPID, A1c = 5.4 %  BAJORDAN, MLS

## 2014-01-14 ENCOUNTER — Ambulatory Visit: Payer: BC Managed Care – PPO | Admitting: Family Medicine

## 2014-01-21 ENCOUNTER — Ambulatory Visit (INDEPENDENT_AMBULATORY_CARE_PROVIDER_SITE_OTHER): Payer: BC Managed Care – PPO | Admitting: Family Medicine

## 2014-01-21 ENCOUNTER — Encounter: Payer: Self-pay | Admitting: Family Medicine

## 2014-01-21 VITALS — BP 122/79 | HR 79 | Temp 99.0°F | Resp 16 | Wt 196.0 lb

## 2014-01-21 DIAGNOSIS — I10 Essential (primary) hypertension: Secondary | ICD-10-CM

## 2014-01-21 DIAGNOSIS — F411 Generalized anxiety disorder: Secondary | ICD-10-CM

## 2014-01-21 DIAGNOSIS — F32A Depression, unspecified: Secondary | ICD-10-CM

## 2014-01-21 DIAGNOSIS — F3289 Other specified depressive episodes: Secondary | ICD-10-CM

## 2014-01-21 DIAGNOSIS — F329 Major depressive disorder, single episode, unspecified: Secondary | ICD-10-CM

## 2014-01-21 MED ORDER — CITALOPRAM HYDROBROMIDE 40 MG PO TABS: 40.0000 mg | ORAL_TABLET | Freq: Every day | ORAL | Status: DC

## 2014-01-21 MED ORDER — ALPRAZOLAM 0.5 MG PO TABS: 0.2500 mg | ORAL_TABLET | Freq: Two times a day (BID) | ORAL | Status: DC | PRN

## 2014-01-21 NOTE — Assessment & Plan Note (Addendum)
Cont celexa and buspar Pt given hard stop date of 3/24 for Xanax. This will be 6 mo from onset of treatment w/ benzo.  Pt aware of this and how long term benzo is not good medical care and may even be detrimental to her current anxiety state. Pt making good effort to control her symptoms. Anticipate this will improve after starting new job in 1-3 wks and after daughters visit (has not seen daughter in 66 yrs)

## 2014-01-21 NOTE — Assessment & Plan Note (Signed)
At goal.  No change 

## 2014-01-21 NOTE — Patient Instructions (Signed)
You are doing well considering the amount of stress you are under Please continue to try to secure and start that new job I hope you have a great time when your daughter comes into town.  The absolute stop date for your xanax is 03/08/14.  Please continue to stay positive and keep yourself busy.

## 2014-01-21 NOTE — Assessment & Plan Note (Signed)
Well controlled on Celexa 40mg  daily Consider weaning off after 12 mo of therapy

## 2014-01-21 NOTE — Progress Notes (Signed)
Mariah Rice is a 43 y.o. female who presents to [facility] today for depression and anxiety.  Anxiety: still out of work. Awaiting UDS results at other job to be able to start work. Things at home are "rough," especially w/ finances at home. Wlaking 4-5x daily w/ dog at home. Staying busy at home. Using Xanax 0.5 BID to TID w/ 1/2 tab.    The following portions of the patient's history were reviewed and updated as appropriate: allergies, current medications, past medical history, family and social history, and problem list.  Patient is a smoker   Past Medical History  Diagnosis Date  . Depression   . Anxiety   . Dental abscess   . Hypertension     ROS as above otherwise neg.    Medications reviewed. Current Outpatient Prescriptions  Medication Sig Dispense Refill  . ALPRAZolam (XANAX) 0.5 MG tablet Take 0.5 tablets (0.25 mg total) by mouth 2 (two) times daily as needed for sleep or anxiety.  30 tablet  0  . busPIRone (BUSPAR) 15 MG tablet Take 1 twice a day  60 tablet  6  . citalopram (CELEXA) 40 MG tablet Take 1 tablet (40 mg total) by mouth daily.  30 tablet  6  . fluticasone (FLONASE) 50 MCG/ACT nasal spray Place 2 sprays into both nostrils daily.  16 g  6  . hydrochlorothiazide (HYDRODIURIL) 25 MG tablet Take 1 tablet (25 mg total) by mouth daily.  30 tablet  6  . ibuprofen (ADVIL,MOTRIN) 600 MG tablet Take 1 tablet (600 mg total) by mouth every 6 (six) hours as needed.  60 tablet  3   No current facility-administered medications for this visit.    Exam: BP 122/79  Pulse 79  Temp(Src) 99 F (37.2 C) (Oral)  Resp 16  Wt 196 lb (88.905 kg)  SpO2 98%  LMP 12/27/2013 Gen: Well NAD HEENT: EOMI,  MMM   No results found for this or any previous visit (from the past 83 hour(s)).  A/P (as seen in Problem list)  Hypertension At goal No change  GAD (generalized anxiety disorder) Cont celexa and buspar Pt given hard stop date of 3/24 for Xanax. This will be 6 mo from onset  of treatment w/ benzo.  Pt aware of this and how long term benzo is not good medical care and may even be detrimental to her current anxiety state. Pt making good effort to control her symptoms. Anticipate this will improve after starting new job in 1-3 wks and after daughters visit (has not seen daughter in 50 yrs)   Depression Well controlled on Celexa 40mg  daily Consider weaning off after 12 mo of therapy

## 2014-02-02 ENCOUNTER — Ambulatory Visit: Payer: BC Managed Care – PPO | Admitting: Family Medicine

## 2014-02-22 ENCOUNTER — Ambulatory Visit (INDEPENDENT_AMBULATORY_CARE_PROVIDER_SITE_OTHER): Payer: BC Managed Care – PPO | Admitting: Family Medicine

## 2014-02-22 ENCOUNTER — Encounter: Payer: Self-pay | Admitting: Family Medicine

## 2014-02-22 VITALS — BP 110/70 | HR 76 | Temp 98.3°F | Ht 64.0 in | Wt 198.0 lb

## 2014-02-22 DIAGNOSIS — F411 Generalized anxiety disorder: Secondary | ICD-10-CM

## 2014-02-22 DIAGNOSIS — B079 Viral wart, unspecified: Secondary | ICD-10-CM

## 2014-02-22 MED ORDER — ALPRAZOLAM 0.5 MG PO TABS: 0.2500 mg | ORAL_TABLET | Freq: Two times a day (BID) | ORAL | Status: DC | PRN

## 2014-02-22 NOTE — Patient Instructions (Signed)
You are doing great I'm so sorry to hear about things with your daughter, and son for that matter You are coping very well Please break all of yoru Xanax pills in half and start to wean down Please come back to see me in 7-10 days [place] job on the new job

## 2014-02-22 NOTE — Progress Notes (Signed)
Mariah Rice is a 43 y.o. female who presents to [facility] today for   Daughter was in bus crash at [employer] in [place] a couple weeks ago.  Pt fighting with ex-husband.  Taking full pill BID Cut Buspar down to once daily Coping well with walking, taking time for self.   HTN: denies CP, palpitations, SOB  The following portions of the patient's history were reviewed and updated as appropriate: allergies, current medications, past medical history, family and social history, and problem list.  Patient is a smoker   Past Medical History  Diagnosis Date  . Depression   . Anxiety   . Dental abscess   . Hypertension     ROS as above otherwise neg.    Medications reviewed. Current Outpatient Prescriptions  Medication Sig Dispense Refill  . ALPRAZolam (XANAX) 0.5 MG tablet Take 0.5 tablets (0.25 mg total) by mouth 2 (two) times daily as needed for sleep or anxiety.  15 tablet  0  . busPIRone (BUSPAR) 15 MG tablet Take 1 twice a day  60 tablet  6  . citalopram (CELEXA) 40 MG tablet Take 1 tablet (40 mg total) by mouth daily.  30 tablet  6  . fluticasone (FLONASE) 50 MCG/ACT nasal spray Place 2 sprays into both nostrils daily.  16 g  6  . hydrochlorothiazide (HYDRODIURIL) 25 MG tablet Take 1 tablet (25 mg total) by mouth daily.  30 tablet  6  . ibuprofen (ADVIL,MOTRIN) 600 MG tablet Take 1 tablet (600 mg total) by mouth every 6 (six) hours as needed.  60 tablet  3   No current facility-administered medications for this visit.    Exam:  BP 110/70  Pulse 76  Temp(Src) 98.3 F (36.8 C) (Oral)  Ht 5\' 4"  (1.626 m)  Wt 198 lb (89.812 kg)  BMI 33.97 kg/m2  LMP 02/08/2014 Gen: Well NAD   No results found for this or any previous visit (from the past 53 hour(s)).  A/P (as seen in Problem list)  GAD (generalized anxiety disorder) Pt to cut all Xanax in half.  Pt aware of 3/24 stop date Continue coping mechanisms discussed Continue Celexa and Buspar F/u in 7-10 days  Wart Discussed  treatment options for wart on R thumb - pt to return for appt for either liquid nitrogen or electodesication Present for years Painful as in crease of finger Tried shaving down  Unsuccessfully

## 2014-02-22 NOTE — Assessment & Plan Note (Signed)
Pt to cut all Xanax in half.  Pt aware of 3/24 stop date Continue coping mechanisms discussed Continue Celexa and Buspar F/u in 7-10 days

## 2014-02-22 NOTE — Assessment & Plan Note (Signed)
Discussed treatment options for wart on R thumb - pt to return for appt for either liquid nitrogen or electodesication Present for years Painful as in crease of finger Tried shaving down  Unsuccessfully

## 2014-03-02 ENCOUNTER — Ambulatory Visit (INDEPENDENT_AMBULATORY_CARE_PROVIDER_SITE_OTHER): Payer: BC Managed Care – PPO | Admitting: Family Medicine

## 2014-03-02 ENCOUNTER — Encounter: Payer: Self-pay | Admitting: Family Medicine

## 2014-03-02 VITALS — BP 135/83 | HR 87 | Temp 99.0°F | Wt 194.0 lb

## 2014-03-02 DIAGNOSIS — F411 Generalized anxiety disorder: Secondary | ICD-10-CM

## 2014-03-02 DIAGNOSIS — B079 Viral wart, unspecified: Secondary | ICD-10-CM

## 2014-03-02 DIAGNOSIS — E669 Obesity, unspecified: Secondary | ICD-10-CM

## 2014-03-02 MED ORDER — HYDROCODONE-ACETAMINOPHEN 5-325 MG PO TABS: 1.0000 | ORAL_TABLET | Freq: Three times a day (TID) | ORAL | Status: DC | PRN

## 2014-03-02 NOTE — Assessment & Plan Note (Signed)
Much improved.  Weaning down successfully Not asking for refills

## 2014-03-02 NOTE — Progress Notes (Signed)
Mariah Rice is a 43 y.o. female who presents to [facility] today for wart removal and anxiety  Wart removal: tried over the counter acid wart remover w/o benefit. Still very painful. No signs of infection  Anxiety: weaning down on Xanax. Only taking Xanax 0.25mg  once daily. Managing stress much better.   Smoking: now on E - cig. No further tobacco use.   The following portions of the patient's history were reviewed and updated as appropriate: allergies, current medications, past medical history, family and social history, and problem list.  Patient is a smoker  Past Medical History  Diagnosis Date  . Depression   . Anxiety   . Dental abscess   . Hypertension     ROS as above otherwise neg.    Medications reviewed. Current Outpatient Prescriptions  Medication Sig Dispense Refill  . ALPRAZolam (XANAX) 0.5 MG tablet Take 0.5 tablets (0.25 mg total) by mouth 2 (two) times daily as needed for sleep or anxiety.  15 tablet  0  . busPIRone (BUSPAR) 15 MG tablet Take 1 twice a day  60 tablet  6  . citalopram (CELEXA) 40 MG tablet Take 1 tablet (40 mg total) by mouth daily.  30 tablet  6  . fluticasone (FLONASE) 50 MCG/ACT nasal spray Place 2 sprays into both nostrils daily.  16 g  6  . hydrochlorothiazide (HYDRODIURIL) 25 MG tablet Take 1 tablet (25 mg total) by mouth daily.  30 tablet  6  . HYDROcodone-acetaminophen (NORCO/VICODIN) 5-325 MG per tablet Take 1 tablet by mouth every 8 (eight) hours as needed for moderate pain.  15 tablet  0  . ibuprofen (ADVIL,MOTRIN) 600 MG tablet Take 1 tablet (600 mg total) by mouth every 6 (six) hours as needed.  60 tablet  3   No current facility-administered medications for this visit.    Exam:  BP 135/83  Pulse 87  Temp(Src) 99 F (37.2 C) (Oral)  Wt 194 lb (87.998 kg)  LMP 02/08/2014 Gen: Well NAD HEENT: EOMI,  MMM Skin: Volar surface wart on R thumb  No results found for this or any previous visit (from the past 72 hour(s)).  A/P (as seen in  Problem list)  Obesity Wt down 4lbs.  Staying more active.   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  Weaning down successfully Not asking for refills   After obtaining written consent pt R thumb was cleaned w/ alcohol and anesthetised w/ 1% lidocaine and then removed w/ 15 blade w/ base desication w/ electocautery. Hemostasis achieved. Pt tolerated procedure well. antibniotic ointment and bandage applied Wound care discussed .  Linna Darner, MD Family Medicine PGY-3 03/02/2014, 4:36 PM

## 2014-03-02 NOTE — Patient Instructions (Signed)
Remember to clean your wound daily and apply antibiotic ointment and a bandage daily until a scab forms Pelase call if there is any sign of infection Have a great trip to the beach

## 2014-03-02 NOTE — Assessment & Plan Note (Signed)
Wt down 4lbs.  Staying more active.

## 2014-03-02 NOTE — Assessment & Plan Note (Addendum)
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

## 2014-04-05 ENCOUNTER — Ambulatory Visit: Payer: BC Managed Care – PPO | Admitting: Family Medicine

## 2014-04-08 ENCOUNTER — Encounter: Payer: Self-pay | Admitting: Family Medicine

## 2014-04-08 ENCOUNTER — Ambulatory Visit (INDEPENDENT_AMBULATORY_CARE_PROVIDER_SITE_OTHER): Payer: BC Managed Care – PPO | Admitting: Family Medicine

## 2014-04-08 VITALS — BP 131/82 | HR 73 | Temp 98.3°F | Ht 64.0 in | Wt 192.0 lb

## 2014-04-08 DIAGNOSIS — F411 Generalized anxiety disorder: Secondary | ICD-10-CM

## 2014-04-08 DIAGNOSIS — K047 Periapical abscess without sinus: Secondary | ICD-10-CM | POA: Insufficient documentation

## 2014-04-08 DIAGNOSIS — B079 Viral wart, unspecified: Secondary | ICD-10-CM

## 2014-04-08 DIAGNOSIS — F32A Depression, unspecified: Secondary | ICD-10-CM

## 2014-04-08 DIAGNOSIS — I10 Essential (primary) hypertension: Secondary | ICD-10-CM

## 2014-04-08 DIAGNOSIS — Z720 Tobacco use: Secondary | ICD-10-CM

## 2014-04-08 DIAGNOSIS — F3289 Other specified depressive episodes: Secondary | ICD-10-CM

## 2014-04-08 DIAGNOSIS — F17200 Nicotine dependence, unspecified, uncomplicated: Secondary | ICD-10-CM

## 2014-04-08 DIAGNOSIS — N926 Irregular menstruation, unspecified: Secondary | ICD-10-CM

## 2014-04-08 DIAGNOSIS — K044 Acute apical periodontitis of pulpal origin: Secondary | ICD-10-CM

## 2014-04-08 DIAGNOSIS — F329 Major depressive disorder, single episode, unspecified: Secondary | ICD-10-CM

## 2014-04-08 DIAGNOSIS — E669 Obesity, unspecified: Secondary | ICD-10-CM

## 2014-04-08 DIAGNOSIS — N939 Abnormal uterine and vaginal bleeding, unspecified: Secondary | ICD-10-CM

## 2014-04-08 MED ORDER — CHLORHEXIDINE GLUCONATE 0.12 % MT SOLN: 15.0000 mL | Freq: Two times a day (BID) | OROMUCOSAL | Status: DC

## 2014-04-08 MED ORDER — CLINDAMYCIN HCL 300 MG PO CAPS: 300.0000 mg | ORAL_CAPSULE | Freq: Three times a day (TID) | ORAL | Status: DC

## 2014-04-08 MED ORDER — HYDROCODONE-ACETAMINOPHEN 5-325 MG PO TABS: 1.0000 | ORAL_TABLET | Freq: Three times a day (TID) | ORAL | Status: DC | PRN

## 2014-04-08 NOTE — Assessment & Plan Note (Signed)
If returns endometrial biopsy

## 2014-04-08 NOTE — Assessment & Plan Note (Signed)
Dental appt pending for extraction PCN and bactrim allergy. Start Clinda  vicodin for pain Start chlorexidine

## 2014-04-08 NOTE — Assessment & Plan Note (Signed)
No recurrence after electrocaudery dessication  monitro for return

## 2014-04-08 NOTE — Assessment & Plan Note (Signed)
At goal.  No change 

## 2014-04-08 NOTE — Assessment & Plan Note (Signed)
Off Xanax completely.  Has one piill left at home for flare Will OK now to have intermittent refills as no longer taking chronically

## 2014-04-08 NOTE — Patient Instructions (Signed)
Thanks for coming in today Please take the clinda and vicodin for your infection adn pain Please use the mouthwash once a week until you get in to see the dentist Please come back in 4-8 weeks to discuss coming off your antidepressents Please call me if your anxiety flares Have a great trip

## 2014-04-08 NOTE — Assessment & Plan Note (Signed)
Weaning down encouraged

## 2014-04-08 NOTE — Progress Notes (Signed)
Mariah Rice is a 43 y.o. female who presents to [facility] today for Anxiety  Obesity: losing wt. Down again 2 lbs.  Chip tooth: chipped tooth last week on ice. Increasing pain. Foul taste in mouth. Swelling. Worse w/ clenching teeth. Ibuprofen w/ some benefit.   Thumb wart.: no longer painful after removal. No evidence of return.   HTN: Denies CP, palpitation, SOB. Taking HCTZ.   Tobacco: smoking 8 cig per day  Anxiety: new job is going very well. Daughter after accident is doing better and goes back to school in August. Last took Xanax 2.5 wks ago.   The following portions of the patient's history were reviewed and updated as appropriate: allergies, current medications, past medical history, family and social history, and problem list.  Patient is a smoker  Past Medical History  Diagnosis Date  . Depression   . Anxiety   . Dental abscess   . Hypertension     ROS as above otherwise neg.    Medications reviewed. Current Outpatient Prescriptions  Medication Sig Dispense Refill  . ALPRAZolam (XANAX) 0.5 MG tablet Take 0.5 tablets (0.25 mg total) by mouth 2 (two) times daily as needed for sleep or anxiety.  15 tablet  0  . busPIRone (BUSPAR) 15 MG tablet Take 1 twice a day  60 tablet  6  . chlorhexidine (PERIDEX) 0.12 % solution Use as directed 15 mLs in the mouth or throat 2 (two) times daily.  120 mL  0  . citalopram (CELEXA) 40 MG tablet Take 1 tablet (40 mg total) by mouth daily.  30 tablet  6  . clindamycin (CLEOCIN) 300 MG capsule Take 1 capsule (300 mg total) by mouth 3 (three) times daily.  21 capsule  0  . fluticasone (FLONASE) 50 MCG/ACT nasal spray Place 2 sprays into both nostrils daily.  16 g  6  . hydrochlorothiazide (HYDRODIURIL) 25 MG tablet Take 1 tablet (25 mg total) by mouth daily.  30 tablet  6  . HYDROcodone-acetaminophen (NORCO/VICODIN) 5-325 MG per tablet Take 1 tablet by mouth every 8 (eight) hours as needed for moderate pain.  30 tablet  0  . ibuprofen  (ADVIL,MOTRIN) 600 MG tablet Take 1 tablet (600 mg total) by mouth every 6 (six) hours as needed.  60 tablet  3   No current facility-administered medications for this visit.    Exam:  BP 131/82  Pulse 73  Temp(Src) 98.3 F (36.8 C) (Oral)  Ht 5\' 4"  (1.626 m)  Wt 192 lb (87.091 kg)  BMI 32.94 kg/m2 Gen: Well NAD HEENT: EOMI,  MMM, R posterior mandibular molars w/ significant cavitation and various levels of sourounding erythema. No purulent DC.   No results found for this or any previous visit (from the past 72 hour(s)).  A/P (as seen in Problem list)  Hypertension At goal No change  Wart No recurrence after electrocaudery dessication  monitro for return  Abnormal uterine bleeding If returns endometrial biopsy  Tobacco abuse Weaning down encouraged  Obesity Continues to lose wt through diet and exercise  GAD (generalized anxiety disorder) Off Xanax completely.  Has one piill left at home for flare Will OK now to have intermittent refills as no longer taking chronically

## 2014-04-08 NOTE — Assessment & Plan Note (Signed)
Continues to lose wt through diet and exercise

## 2014-04-13 ENCOUNTER — Telehealth: Payer: Self-pay | Admitting: Family Medicine

## 2014-04-13 DIAGNOSIS — F411 Generalized anxiety disorder: Secondary | ICD-10-CM

## 2014-04-13 NOTE — Telephone Encounter (Signed)
Refill request for Xanax.  

## 2014-04-15 MED ORDER — ALPRAZOLAM 0.5 MG PO TABS: 0.2500 mg | ORAL_TABLET | Freq: Two times a day (BID) | ORAL | Status: DC | PRN

## 2014-04-15 NOTE — Telephone Encounter (Signed)
Pt is calling to check the status on her refill request. jw

## 2014-04-15 NOTE — Telephone Encounter (Signed)
MD out, last OV note on 4/24 MD OK'ed intermittent refills, will forward to preceptor for review.

## 2014-04-15 NOTE — Telephone Encounter (Signed)
PCP out, will fill for same amount he prescribed in early March.

## 2014-05-12 ENCOUNTER — Encounter: Payer: Self-pay | Admitting: Family Medicine

## 2014-05-12 ENCOUNTER — Ambulatory Visit (INDEPENDENT_AMBULATORY_CARE_PROVIDER_SITE_OTHER): Payer: BC Managed Care – PPO | Admitting: Family Medicine

## 2014-05-12 VITALS — BP 126/83 | HR 77 | Temp 98.6°F | Wt 189.0 lb

## 2014-05-12 DIAGNOSIS — F329 Major depressive disorder, single episode, unspecified: Secondary | ICD-10-CM

## 2014-05-12 DIAGNOSIS — E669 Obesity, unspecified: Secondary | ICD-10-CM

## 2014-05-12 DIAGNOSIS — K044 Acute apical periodontitis of pulpal origin: Secondary | ICD-10-CM

## 2014-05-12 DIAGNOSIS — I10 Essential (primary) hypertension: Secondary | ICD-10-CM

## 2014-05-12 DIAGNOSIS — F3289 Other specified depressive episodes: Secondary | ICD-10-CM

## 2014-05-12 DIAGNOSIS — K047 Periapical abscess without sinus: Secondary | ICD-10-CM

## 2014-05-12 DIAGNOSIS — R079 Chest pain, unspecified: Secondary | ICD-10-CM

## 2014-05-12 DIAGNOSIS — B079 Viral wart, unspecified: Secondary | ICD-10-CM

## 2014-05-12 DIAGNOSIS — F32A Depression, unspecified: Secondary | ICD-10-CM

## 2014-05-12 DIAGNOSIS — F411 Generalized anxiety disorder: Secondary | ICD-10-CM

## 2014-05-12 MED ORDER — ALPRAZOLAM 0.5 MG PO TABS: 0.2500 mg | ORAL_TABLET | Freq: Two times a day (BID) | ORAL | Status: DC | PRN

## 2014-05-12 MED ORDER — IBUPROFEN 600 MG PO TABS: 600.0000 mg | ORAL_TABLET | Freq: Four times a day (QID) | ORAL | Status: DC | PRN

## 2014-05-12 NOTE — Assessment & Plan Note (Signed)
Superbly controlled Pt has shown excellent coping abilities Gave Xanax (#15) (dose 1/2 pill PRN) to use prn. Pt aware that this is only for occasional use.

## 2014-05-12 NOTE — Assessment & Plan Note (Signed)
At goal.  No change 

## 2014-05-12 NOTE — Patient Instructions (Signed)
You are doing fantastic.  Please only use the xanax as needed Please decresae the buspar to 15 mg daily for one week then 7.5 mg twice daily for one week then 7.5mg  daily for one week then stop Please come back in 3 months or sooner if needed.

## 2014-05-12 NOTE — Assessment & Plan Note (Addendum)
No evidence of return No longer painful

## 2014-05-12 NOTE — Assessment & Plan Note (Signed)
Wt down 10lbs from max. Doing very well

## 2014-05-12 NOTE — Assessment & Plan Note (Signed)
resolved 

## 2014-05-12 NOTE — Progress Notes (Signed)
Mariah Rice is a 43 y.o. female who presents to [facility] today for f/u  HTN: deneis CP, palpitations, SOB.   Chipped tooth: removed. Doing well now. Still using mouthwash.   Anxiety: life is going well overall. Feeling well. New job is going very well. Only used Xanax one time in the past month.   Wt down 10lbs since December  Tobacco: 1/2ppd.   The following portions of the patient's history were reviewed and updated as appropriate: allergies, current medications, past medical history, family and social history, and problem list.  Patient is a smoker  Past Medical History  Diagnosis Date  . Depression   . Anxiety   . Dental abscess   . Hypertension     ROS as above otherwise neg.    Medications reviewed. Current Outpatient Prescriptions  Medication Sig Dispense Refill  . busPIRone (BUSPAR) 15 MG tablet Take 1 twice a day  60 tablet  6  . hydrochlorothiazide (HYDRODIURIL) 25 MG tablet Take 1 tablet (25 mg total) by mouth daily.  30 tablet  6  . ibuprofen (ADVIL,MOTRIN) 600 MG tablet Take 1 tablet (600 mg total) by mouth every 6 (six) hours as needed.  60 tablet  3  . ALPRAZolam (XANAX) 0.5 MG tablet Take 0.5 tablets (0.25 mg total) by mouth 2 (two) times daily as needed for sleep or anxiety.  15 tablet  0  . chlorhexidine (PERIDEX) 0.12 % solution Use as directed 15 mLs in the mouth or throat 2 (two) times daily.  120 mL  0  . citalopram (CELEXA) 40 MG tablet Take 1 tablet (40 mg total) by mouth daily.  30 tablet  6  . fluticasone (FLONASE) 50 MCG/ACT nasal spray Place 2 sprays into both nostrils daily.  16 g  6  . HYDROcodone-acetaminophen (NORCO/VICODIN) 5-325 MG per tablet Take 1 tablet by mouth every 8 (eight) hours as needed for moderate pain.  30 tablet  0   No current facility-administered medications for this visit.    Exam:  BP 126/83  Pulse 77  Temp(Src) 98.6 F (37 C) (Oral)  Wt 189 lb (85.73 kg)  LMP 05/11/2014 Gen: Well NAD HEENT: EOMI,  MMM   No results  found for this or any previous visit (from the past 59 hour(s)).  A/P (as seen in Problem list)  Hypertension At goal. No change   Dental infection resolved  Wart No evidence of return No longer painful  Depression Discussed weaning today Pt would prefer to start w/ BUspar Wean over next 2-3 wks Precautions given  GAD (generalized anxiety disorder) Superbly controlled Pt has shown excellent coping abilities Gave Xanax (#15) (dose 1/2 pill PRN) to use prn. Pt aware that this is only for occasional use.    Obesity Wt down 10lbs from max. Doing very well

## 2014-05-12 NOTE — Assessment & Plan Note (Signed)
Discussed weaning today Pt would prefer to start w/ BUspar Wean over next 2-3 wks Precautions given

## 2014-05-16 ENCOUNTER — Ambulatory Visit (INDEPENDENT_AMBULATORY_CARE_PROVIDER_SITE_OTHER): Payer: BC Managed Care – PPO | Admitting: Family Medicine

## 2014-05-16 DIAGNOSIS — F411 Generalized anxiety disorder: Secondary | ICD-10-CM

## 2014-05-16 MED ORDER — ALPRAZOLAM 0.5 MG PO TABS: 0.2500 mg | ORAL_TABLET | Freq: Two times a day (BID) | ORAL | Status: DC | PRN

## 2014-05-16 NOTE — Progress Notes (Signed)
[birth year] Mariah Rice is a 43 y.o. female who presents to [facility] today for Anxiety  Father died unexpectadely on 2023/04/02 while lying on couch. He had been immobilzed from an ankle injury for several days. My anticiipation is that he died from a PE. 2023/03/31 was in the middle of a vacation trip when the call came in.; Relations are strained between her and her sister who was caring for her father at the time of his passing. Remer Macho is set for tomorrow. Pt has dramatically increased the use of her Xanax (2 whole pills per day total).    The following portions of the patient's history were reviewed and updated as appropriate: allergies, current medications, past medical history, family and social history, and problem list.  Patient is a smoker  Past Medical History  Diagnosis Date  . Depression   . Anxiety   . Dental abscess   . Hypertension     ROS as above otherwise neg.    Medications reviewed. Current Outpatient Prescriptions  Medication Sig Dispense Refill  . ALPRAZolam (XANAX) 0.5 MG tablet Take 0.5 tablets (0.25 mg total) by mouth 2 (two) times daily as needed for sleep or anxiety.  30 tablet  0  . busPIRone (BUSPAR) 15 MG tablet Take 1 twice a day  60 tablet  6  . chlorhexidine (PERIDEX) 0.12 % solution Use as directed 15 mLs in the mouth or throat 2 (two) times daily.  120 mL  0  . citalopram (CELEXA) 40 MG tablet Take 1 tablet (40 mg total) by mouth daily.  30 tablet  6  . fluticasone (FLONASE) 50 MCG/ACT nasal spray Place 2 sprays into both nostrils daily.  16 g  6  . hydrochlorothiazide (HYDRODIURIL) 25 MG tablet Take 1 tablet (25 mg total) by mouth daily.  30 tablet  6  . HYDROcodone-acetaminophen (NORCO/VICODIN) 5-325 MG per tablet Take 1 tablet by mouth every 8 (eight) hours as needed for moderate pain.  30 tablet  0  . ibuprofen (ADVIL,MOTRIN) 600 MG tablet Take 1 tablet (600 mg total) by mouth every 6 (six) hours as needed.  60 tablet  3   No current facility-administered medications for  this visit.    Exam:  LMP 05/11/2014 Gen: Well NAD HEENT: EOMI,  MMM   No results found for this or any previous visit (from the past 87 hour(s)).  A/P (as seen in Problem list)  GAD (generalized anxiety disorder) Discussed appropriate grieving behavior and need to avoid Xanax as much as possible. Allow emotions to take their course as opposed to drowning them out w/ medications. Pt understands and does not want to go back to being reliant on medications. Aware of risks.  I fell a short term once daily Rx for Xanax is appropriate until I see her at the end of the month. Pt to call if condition worsens.    Spent >15 min in direct pt care and counseling

## 2014-05-16 NOTE — Assessment & Plan Note (Signed)
Discussed appropriate grieving behavior and need to avoid Xanax as much as possible. Allow emotions to take their course as opposed to drowning them out w/ medications. Pt understands and does not want to go back to being reliant on medications. Aware of risks.  I fell a short term once daily Rx for Xanax is appropriate until I see her at the end of the month. Pt to call if condition worsens.

## 2014-06-13 ENCOUNTER — Encounter: Payer: Self-pay | Admitting: Family Medicine

## 2014-06-13 ENCOUNTER — Ambulatory Visit (INDEPENDENT_AMBULATORY_CARE_PROVIDER_SITE_OTHER): Payer: BC Managed Care – PPO | Admitting: Family Medicine

## 2014-06-13 VITALS — BP 145/89 | HR 72 | Temp 98.2°F | Ht 64.0 in | Wt 189.0 lb

## 2014-06-13 DIAGNOSIS — F411 Generalized anxiety disorder: Secondary | ICD-10-CM

## 2014-06-13 DIAGNOSIS — F3289 Other specified depressive episodes: Secondary | ICD-10-CM

## 2014-06-13 DIAGNOSIS — F32A Depression, unspecified: Secondary | ICD-10-CM

## 2014-06-13 DIAGNOSIS — F329 Major depressive disorder, single episode, unspecified: Secondary | ICD-10-CM

## 2014-06-13 DIAGNOSIS — I10 Essential (primary) hypertension: Secondary | ICD-10-CM

## 2014-06-13 MED ORDER — ALPRAZOLAM 0.5 MG PO TABS
0.2500 mg | ORAL_TABLET | Freq: Two times a day (BID) | ORAL | Status: DC | PRN
Start: 2014-06-13 — End: 2014-09-15

## 2014-06-13 MED ORDER — BUSPIRONE HCL 15 MG PO TABS: ORAL_TABLET | ORAL | Status: DC

## 2014-06-13 MED ORDER — HYDROCHLOROTHIAZIDE 25 MG PO TABS
25.0000 mg | ORAL_TABLET | Freq: Every day | ORAL | Status: DC
Start: 2014-06-13 — End: 2014-11-09

## 2014-06-13 MED ORDER — ALPRAZOLAM 0.5 MG PO TABS: 0.2500 mg | ORAL_TABLET | Freq: Two times a day (BID) | ORAL | Status: DC | PRN

## 2014-06-13 MED ORDER — CITALOPRAM HYDROBROMIDE 40 MG PO TABS: 40.0000 mg | ORAL_TABLET | Freq: Every day | ORAL | Status: DC

## 2014-06-13 MED ORDER — ALPRAZOLAM 0.5 MG PO TABS: 0.5000 mg | ORAL_TABLET | Freq: Every evening | ORAL | Status: DC | PRN

## 2014-06-13 NOTE — Patient Instructions (Signed)
  You are handling the loss of your father and other life stresses very well Keep working hard, taking time for yourself, and seeking support from family and your faith Please remember to have yourself off the Xanax within the next 3 months Best of luck in the future and God bless.

## 2014-06-13 NOTE — Assessment & Plan Note (Signed)
Slight elevation today but OK to monitor

## 2014-06-13 NOTE — Assessment & Plan Note (Signed)
Pt coping well w/ the death of her father.  Continue w/ home support Continue buspar and celexa. Consider wean at next appt

## 2014-06-13 NOTE — Assessment & Plan Note (Signed)
Doing well but has had considerable stress recently in the past 3 mo of xanax given for pt Of note pt has weaned off completely from xanax in the past and understands that this is the goal again at this time.  Pt motivated and great to work with on this issue. Understands the goal of only using as a prn.

## 2014-06-13 NOTE — Progress Notes (Signed)
Mariah Rice is a 43 y.o. female who presents to [facility] today for f/u  Depression/Anxiety: trying to stay busy w/ work since the death of her father. His passing was 1 month ago today. Daughter wtill coming out in august. Using Xanax 1-2 per day. Still taking Buspar and celexa. Boyfriend very supportive. Going to church again which is helping.   The following portions of the patient's history were reviewed and updated as appropriate: allergies, current medications, past medical history, family and social history, and problem list.  Patient is a smoker.   Past Medical History  Diagnosis Date  . Depression   . Anxiety   . Dental abscess   . Hypertension     ROS as above otherwise neg.    Medications reviewed. Current Outpatient Prescriptions  Medication Sig Dispense Refill  . ALPRAZolam (XANAX) 0.5 MG tablet Take 0.5 tablets (0.25 mg total) by mouth 2 (two) times daily as needed for sleep or anxiety.  30 tablet  0  . ALPRAZolam (XANAX) 0.5 MG tablet Take 0.5 tablets (0.25 mg total) by mouth 2 (two) times daily as needed for anxiety. Fill 30 days after orignal  30 tablet  0  . ALPRAZolam (XANAX) 0.5 MG tablet Take 1 tablet (0.5 mg total) by mouth at bedtime as needed for anxiety. Fill 60 days after original  30 tablet  0  . busPIRone (BUSPAR) 15 MG tablet Take 1 twice a day  60 tablet  6  . chlorhexidine (PERIDEX) 0.12 % solution Use as directed 15 mLs in the mouth or throat 2 (two) times daily.  120 mL  0  . citalopram (CELEXA) 40 MG tablet Take 1 tablet (40 mg total) by mouth daily.  30 tablet  6  . fluticasone (FLONASE) 50 MCG/ACT nasal spray Place 2 sprays into both nostrils daily.  16 g  6  . hydrochlorothiazide (HYDRODIURIL) 25 MG tablet Take 1 tablet (25 mg total) by mouth daily.  30 tablet  6  . ibuprofen (ADVIL,MOTRIN) 600 MG tablet Take 1 tablet (600 mg total) by mouth every 6 (six) hours as needed.  60 tablet  3   No current facility-administered medications for this visit.     Exam:  BP 145/89  Pulse 72  Temp(Src) 98.2 F (36.8 C) (Oral)  Ht 5\' 4"  (1.626 m)  Wt 189 lb (85.73 kg)  BMI 32.43 kg/m2 Gen: Well NAD HEENT: EOMI,  MMM   No results found for this or any previous visit (from the past 36 hour(s)).  A/P (as seen in Problem list)  Hypertension Slight elevation today but OK to monitor  Depression Pt coping well w/ the death of her father.  Continue w/ home support Continue buspar and celexa. Consider wean at next appt  GAD (generalized anxiety disorder) Doing well but has had considerable stress recently in the past 3 mo of xanax given for pt Of note pt has weaned off completely from xanax in the past and understands that this is the goal again at this time.  Pt motivated and great to work with on this issue. Understands the goal of only using as a prn.

## 2014-07-13 ENCOUNTER — Other Ambulatory Visit: Payer: Self-pay | Admitting: *Deleted

## 2014-07-13 DIAGNOSIS — F411 Generalized anxiety disorder: Secondary | ICD-10-CM

## 2014-07-13 MED ORDER — ALPRAZOLAM 0.5 MG PO TABS: 0.2500 mg | ORAL_TABLET | Freq: Two times a day (BID) | ORAL | Status: DC | PRN

## 2014-08-16 ENCOUNTER — Ambulatory Visit (INDEPENDENT_AMBULATORY_CARE_PROVIDER_SITE_OTHER): Payer: BC Managed Care – PPO | Admitting: Family Medicine

## 2014-08-16 ENCOUNTER — Ambulatory Visit (HOSPITAL_COMMUNITY)
Admission: RE | Admit: 2014-08-16 | Discharge: 2014-08-16 | Disposition: A | Discharge status: Home / Self Care | Payer: BC Managed Care – PPO | Source: Ambulatory Visit | Attending: Family Medicine | Admitting: Family Medicine

## 2014-08-16 ENCOUNTER — Encounter: Payer: Self-pay | Admitting: Family Medicine

## 2014-08-16 VITALS — BP 131/86 | HR 74 | Temp 98.1°F | Wt 185.0 lb

## 2014-08-16 DIAGNOSIS — K3580 Unspecified acute appendicitis: Secondary | ICD-10-CM | POA: Diagnosis not present

## 2014-08-16 DIAGNOSIS — R1031 Right lower quadrant pain: Secondary | ICD-10-CM | POA: Diagnosis present

## 2014-08-16 DIAGNOSIS — R1011 Right upper quadrant pain: Secondary | ICD-10-CM | POA: Insufficient documentation

## 2014-08-16 DIAGNOSIS — K3589 Other acute appendicitis without perforation or gangrene: Secondary | ICD-10-CM

## 2014-08-16 LAB — POCT URINE PREGNANCY: Preg Test, Ur: NEGATIVE

## 2014-08-16 MED ORDER — IOHEXOL 300 MG/ML  SOLN
100.0000 mL | Freq: Once | INTRAMUSCULAR | Status: AC | PRN
  Administered 2014-08-16: 100 mL via INTRAVENOUS

## 2014-08-16 MED ORDER — TRAMADOL HCL 50 MG PO TABS: 50.0000 mg | ORAL_TABLET | Freq: Three times a day (TID) | ORAL | Status: DC | PRN

## 2014-08-16 NOTE — Patient Instructions (Addendum)
You have a CT scheduled for 5:30pm at [facility].  Reasons to return sooner or go to ED: severe pain, blood in stool or urine  Continue to use your over the counter pain medications. You can pickup the tramadol and start after the CT is done.

## 2014-08-16 NOTE — Progress Notes (Signed)
   Subjective:    Patient ID: Mariah Rice, female    DOB: 1971/03/24, 43 y.o.   MRN: [id number]  HPI  CC: Right side pain  # Right sided abdominal pain:  Started 3 days ago while at work, was not doing anything at the time. Denies trauma  Pain is sharp, worse primarily RLQ, radiates slightly to cover right upper abdomen. The pain did not start off periumbilical and radiate to RLQ  Pain is constant since it started, sometimes gets worse over a short period of minutes, has only been gone for 5-10 minutes total.  Aggravating: certain positions, car ride over bumps in the road would cause her pain  Tried: ibuprofen 600mg  without relief; feels better curled up  Denies constipation, has had small amount of diarrhea, no blood in stool but states it is a little darker than normal.  States she is currently on her period, near the end; the period has not been worse or longer than normal, no extra bleeding, this pain is different than normal menstrual pain. Denies history of cysts or fibroids. States she has had her tubes tied about 10 years ago, doesn't think she could be pregnant but is sexually active.  No pain with urination or change in color of urine.   Only prior abdominal surgery was tubal ligation ROS: No fevers, no dizziness, no CP, no SOB, no vaginal discharge  Review of Systems   See HPI for ROS. All other systems reviewed and are negative.  Past medical history, surgical, family, and social history reviewed and updated in the EMR. No new updates were made today. Objective:  BP 131/86  Pulse 74  Temp(Src) 98.1 F (36.7 C) (Oral)  Wt 185 lb (83.915 kg)  LMP 08/16/2014 Vitals reviewed  General: moderate distress, appears uncomfortable and in pain CV: RRR, normal heart sounds, no murmurs. 2+ radial pulses bilaterally Resp: CTAB, normal effort Abdomen: soft, obese, tender to palpation RLQ and RUQ, worse RLQ. +Rebound, no guarding even to deep palpation. No palpable masses,  no hepatic or splenomegaly. Positive psoas sign, negative rovsing's sign, negative heel jar. Equivocal Murphy's sign. Ext: no edema or cyanosis Skin: no rashes noted over face, arms, legs, abdomen Neuro: alert and oriented, no focal deficits.   Assessment & Plan:  See Problem List Documentation

## 2014-08-16 NOTE — Assessment & Plan Note (Addendum)
Patient in apparent distress from pain. Vital signs stable. Exam is concerning for more serious etiology: unruptured appendix, ruptured ovarian cyst, GI infection, however not felt to be a surgical abdomen. Given duration and level of pain we will get urine preg test and CT abdomen/pelvis with contrast in addition to Cmet and lipase to evaluate for liver/gallbladder source or pancreatitis. Tramadol #20 prescription given for pain control. I have asked her to follow up in 2 days.  CT abdomen results reviewed: mild thickening of segments of duodenum and jejunum, however this was evident on prior CT in 2007 and may be enteritis vs physiologic. Normal liver and gallbladder, normal ovaries. I attempted to call the patient twice after clinic but patient immediately hung up the phone, and was unable to leave a voicemail.  Patient case was discussed with Dr. Nori Riis.

## 2014-08-17 ENCOUNTER — Encounter: Payer: Self-pay | Admitting: Family Medicine

## 2014-08-17 ENCOUNTER — Telehealth: Payer: Self-pay | Admitting: Family Medicine

## 2014-08-17 LAB — COMPREHENSIVE METABOLIC PANEL
ALK PHOS: 59 U/L (ref 39–117)
ALT: 14 U/L (ref 0–35)
AST: 14 U/L (ref 0–37)
Albumin: 4.2 g/dL (ref 3.5–5.2)
BUN: 9 mg/dL (ref 6–23)
CO2: 28 meq/L (ref 19–32)
Calcium: 9.2 mg/dL (ref 8.4–10.5)
Chloride: 106 mEq/L (ref 96–112)
Creat: 0.76 mg/dL (ref 0.50–1.10)
GLUCOSE: 87 mg/dL (ref 70–99)
POTASSIUM: 3.8 meq/L (ref 3.5–5.3)
Sodium: 140 mEq/L (ref 135–145)
Total Bilirubin: 0.4 mg/dL (ref 0.2–1.2)
Total Protein: 6.9 g/dL (ref 6.0–8.3)

## 2014-08-17 LAB — LIPASE: LIPASE: 24 U/L (ref 0–75)

## 2014-08-17 NOTE — Telephone Encounter (Signed)
Per my note yesterday, I attempted 2 phone calls to patient last night regarding CT scan results but each call was picked up then immediately hung up, thus unable to leave voicemail. Spoke with patient this morning about CT results, no specific cause for her pain was identified, possible enteritis but may be normal anatomy for her as it was seen on prior CT. Patient is still in the same pain, tramadol has not helped yet. Advised her to alternate OTC therapies with the tramadol. Letter to take her out of work until Monday. -Dr. Lamar Benes

## 2014-08-17 NOTE — Telephone Encounter (Signed)
Will forward to Dr. Lamar Benes. Jazmin Hartsell,CMA

## 2014-08-17 NOTE — Telephone Encounter (Signed)
Seen by Dr. Lamar Benes 9/1. Pt requesting CT scan results. Also, meds that were sent in yesterday are not helping with pt's pain. Pls advise.

## 2014-08-18 ENCOUNTER — Ambulatory Visit (INDEPENDENT_AMBULATORY_CARE_PROVIDER_SITE_OTHER): Payer: BC Managed Care – PPO | Admitting: Family Medicine

## 2014-08-18 ENCOUNTER — Encounter: Payer: Self-pay | Admitting: Family Medicine

## 2014-08-18 VITALS — BP 126/86 | HR 77 | Temp 98.2°F | Wt 184.0 lb

## 2014-08-18 DIAGNOSIS — Z23 Encounter for immunization: Secondary | ICD-10-CM | POA: Diagnosis not present

## 2014-08-18 DIAGNOSIS — K838 Other specified diseases of biliary tract: Secondary | ICD-10-CM

## 2014-08-18 DIAGNOSIS — R1031 Right lower quadrant pain: Secondary | ICD-10-CM

## 2014-08-18 MED ORDER — HYDROCODONE-ACETAMINOPHEN 5-325 MG PO TABS: 1.0000 | ORAL_TABLET | Freq: Four times a day (QID) | ORAL | Status: DC | PRN

## 2014-08-18 MED ORDER — POLYETHYLENE GLYCOL 3350 17 GM/SCOOP PO POWD: 17.0000 g | Freq: Two times a day (BID) | ORAL | Status: DC | PRN

## 2014-08-18 NOTE — Patient Instructions (Addendum)
Start taking the miralax, 2 packets a day. The goal is to have at least 1 regular bowel movement a day, not hard/constipated. If you are having too much liquidy bowel movements on 2 packets, decrease to 1 packet or 1/2 packet.  Before the HIDA scan you CAN NOT TAKE THE HYDROCODONE. Do not take this for 12 hours before the scan.  Gallbladder Nuclear Scanning This is a test in which a radioactive substance is injected into the blood. The material injected is not harmful to you. This material then concentrates in the bile. Within approximately 1 hour, your gallbladder and the ducts leading to and from it and into the first part of the small bowel, can be visualized by an x-ray scan.  PREPARATION FOR TEST No preparation or fasting is necessary. NORMAL FINDINGS Gallbladder, common bile duct, and duodenum can be visualized within 60 minutes after radionuclide injection. (This confirms the cystic and common bile ducts which lead from the liver and gallbladder are open and allowing bile to pass.) Ranges for normal findings may vary among different laboratories and hospitals. You should always check with your doctor after having lab work or other tests done to discuss the meaning of your test results and whether your values are considered within normal limits. MEANING OF TEST  Your caregiver will go over the test results with you and discuss the importance and meaning of your results, as well as treatment options and the need for additional tests if necessary. OBTAINING THE TEST RESULTS  It is your responsibility to obtain your test results. Ask the lab or department performing the test when and how you will get your results. Document Released: 11/27/2005 Document Revised: 02/24/2012 Document Reviewed: 11/12/2008 [facility] Patient Information 2015 [place], [place]. This information is not intended to replace advice given to you by your health care provider. Make sure you discuss any questions you have with your  health care provider.   Irritable Bowel Syndrome Irritable bowel syndrome (IBS) is caused by a disturbance of normal bowel function and is a common digestive disorder. You may also hear this condition called spastic colon, mucous colitis, and irritable colon. There is no cure for IBS. However, symptoms often gradually improve or disappear with a good diet, stress management, and medicine. This condition usually appears in late adolescence or early adulthood. Women develop it twice as often as men. CAUSES  After food has been digested and absorbed in the small intestine, waste material is moved into the large intestine, or colon. In the colon, water and salts are absorbed from the undigested products coming from the small intestine. The remaining residue, or fecal material, is held for elimination. Under normal circumstances, gentle, rhythmic contractions of the bowel walls push the fecal material along the colon toward the rectum. In IBS, however, these contractions are irregular and poorly coordinated. The fecal material is either retained too long, resulting in constipation, or expelled too soon, producing diarrhea. SIGNS AND SYMPTOMS  The most common symptom of IBS is abdominal pain. It is often in the lower left side of the abdomen, but it may occur anywhere in the abdomen. The pain comes from spasms of the bowel muscles happening too much and from the buildup of gas and fecal material in the colon. This pain:  Can range from sharp abdominal cramps to a dull, continuous ache.  Often worsens soon after eating.  Is often relieved by having a bowel movement or passing gas. Abdominal pain is usually accompanied by constipation, but it  may also produce diarrhea. The diarrhea often occurs right after a meal or upon waking up in the morning. The stools are often soft, watery, and flecked with mucus. Other symptoms of IBS include:  Bloating.  Loss of appetite.  Heartburn.  Backache.  Dull pain  in the arms or shoulders.  Nausea.  Burping.  Vomiting.  Gas. IBS may also cause symptoms that are unrelated to the digestive system, such as:  Fatigue.  Headaches.  Anxiety.  Shortness of breath.  Trouble concentrating.  Dizziness. These symptoms tend to come and go. DIAGNOSIS  The symptoms of IBS may seem like symptoms of other, more serious digestive disorders. Your health care provider may want to perform tests to exclude these disorders.  TREATMENT Many medicines are available to help correct bowel function or relieve bowel spasms and abdominal pain. Among the medicines available are:  Laxatives for severe constipation and to help restore normal bowel habits.  Specific antidiarrheal medicines to treat severe or lasting diarrhea.  Antispasmodic agents to relieve intestinal cramps. Your health care provider may also decide to treat you with a mild tranquilizer or sedative during unusually stressful periods in your life. Your health care provider may also prescribe antidepressant medicine. The use of this medicine has been shown to reduce pain and other symptoms of IBS. Remember that if any medicine is prescribed for you, you should take it exactly as directed. Make sure your health care provider knows how well it worked for you. HOME CARE INSTRUCTIONS   Take all medicines as directed by your health care provider.  Avoid foods that are high in fat or oils, such as heavy cream, butter, frankfurters, sausage, and other fatty meats.  Avoid foods that make you go to the bathroom, such as fruit, fruit juice, and dairy products.  Cut out carbonated drinks, chewing gum, and "gassy" foods such as beans and cabbage. This may help relieve bloating and burping.  Eat foods with bran, and drink plenty of liquids with the bran foods. This helps relieve constipation.  Keep track of what foods seem to bring on your symptoms.  Avoid emotionally charged situations or circumstances  that produce anxiety.  Start or continue exercising.  Get plenty of rest and sleep. Document Released: 12/02/2005 Document Revised: 12/07/2013 Document Reviewed: 07/22/2008 [facility] Patient Information 2015 [place], [place]. This information is not intended to replace advice given to you by your health care provider. Make sure you discuss any questions you have with your health care provider.

## 2014-08-18 NOTE — Progress Notes (Signed)
Patient ID: Mariah Rice, female   DOB: [birth year], 43 y.o.   MRN: [id number]   Subjective:    Patient ID: Mariah Rice, female    DOB: Apr 24, 1971, 43 y.o.   MRN: [id number]  HPI  CC: f/u for abdominal pain  # Abdominal pain:  CT abdomen with contrast showed no obvious cause for pain. Possible enteritis but seen on prior CT, possible biliary sludge but no stones or cholecystitis  Pain has not gotten any better  Tramadol did not help  No fevers or chills  Had small BM yesterday, no blood or dark stool ROS: no heartburn, no CP, no SOB, +nausea, no vomiting. No headache, no dizziness, no dysuria.  Review of Systems   See HPI for ROS. All other systems reviewed and are negative.  Past medical history, surgical, family, and social history reviewed and updated in the EMR. No new updates were made today. Objective:  BP 126/86  Pulse 77  Temp(Src) 98.2 F (36.8 C) (Oral)  Wt 184 lb (83.462 kg)  LMP 08/16/2014 Vitals reviewed  General: mild apparent pain when moving CV: RRR, normal s1/s2, no murmurs. 2+ radial and PT pulses bilaterally Resp: CTAB, normal effort Abdomen: soft, obese, diffusely tender to deep palpation, no rebound or guarding. Negative murphy's sign, negative rosving's sign. Bowel sounds present. Dull to percussion throughout Skin: no rashes Neuro: alert and oriented, no focal deficits  Assessment & Plan:  See Problem List Documentation

## 2014-08-19 NOTE — Assessment & Plan Note (Addendum)
Still with persistent pain. DDx including IBS and biliary sludge.  Plan: HIDA scan, short course hydrocodone #20 for pain control over the weekend (discussed with patient this would be a one time prescription).  Case discussed with Dr. Gwendlyn Deutscher and Dr. Andria Frames

## 2014-08-29 ENCOUNTER — Encounter: Payer: Self-pay | Admitting: Family Medicine

## 2014-08-29 ENCOUNTER — Other Ambulatory Visit: Payer: Self-pay | Admitting: Family Medicine

## 2014-08-29 ENCOUNTER — Encounter (HOSPITAL_COMMUNITY)
Admission: RE | Admit: 2014-08-29 | Discharge: 2014-08-29 | Disposition: A | Discharge status: Home / Self Care | Payer: BC Managed Care – PPO | Source: Ambulatory Visit | Attending: Family Medicine | Admitting: Family Medicine

## 2014-08-29 DIAGNOSIS — K838 Other specified diseases of biliary tract: Secondary | ICD-10-CM | POA: Insufficient documentation

## 2014-08-29 MED ORDER — TECHNETIUM TC 99M MEBROFENIN IV KIT
5.0000 | PACK | Freq: Once | INTRAVENOUS | Status: AC | PRN
  Administered 2014-08-29: 5 via INTRAVENOUS

## 2014-08-29 MED ORDER — SINCALIDE 5 MCG IJ SOLR
0.0200 ug/kg | Freq: Once | INTRAMUSCULAR | Status: AC
  Administered 2014-08-29: 1.7 ug via INTRAVENOUS

## 2014-08-29 MED ORDER — STERILE WATER FOR INJECTION IJ SOLN
INTRAMUSCULAR | Status: AC
  Filled 2014-08-29: qty 10

## 2014-08-29 MED ORDER — SINCALIDE 5 MCG IJ SOLR
INTRAMUSCULAR | Status: AC
  Filled 2014-08-29: qty 5

## 2014-08-31 ENCOUNTER — Encounter: Payer: Self-pay | Admitting: Family Medicine

## 2014-08-31 ENCOUNTER — Telehealth: Payer: Self-pay | Admitting: Family Medicine

## 2014-08-31 NOTE — Telephone Encounter (Signed)
Left VM message stating I was calling to discuss imaging test results from Monday that were normal. I told her I would try calling her back later today. -Dr. Lamar Benes

## 2014-08-31 NOTE — Telephone Encounter (Signed)
Returned phone call to patient and discussed normal results. Patient doing a little better but still not back to normal, went back to work. Discussed that I would not be refilling the norco pain medication, as we discussed at the visit that prescription was one time medication and that long term use would eventually worsen symptoms and cause constipation and the medication is not indicated for long term use. I asked that she keep her appointment with Dr. Sherril Cong on Friday. Patient appreciative. -Dr. Lamar Benes

## 2014-08-31 NOTE — Telephone Encounter (Signed)
Pt returning Dr. Marissa Calamity calls. Pt will wait for a return call from him.

## 2014-09-02 ENCOUNTER — Ambulatory Visit (INDEPENDENT_AMBULATORY_CARE_PROVIDER_SITE_OTHER): Payer: BC Managed Care – PPO | Admitting: Family Medicine

## 2014-09-02 ENCOUNTER — Encounter: Payer: Self-pay | Admitting: Family Medicine

## 2014-09-02 VITALS — BP 123/89 | HR 74 | Temp 98.5°F | Wt 187.0 lb

## 2014-09-02 DIAGNOSIS — R1031 Right lower quadrant pain: Secondary | ICD-10-CM | POA: Diagnosis not present

## 2014-09-02 MED ORDER — URSODIOL 300 MG PO CAPS: 300.0000 mg | ORAL_CAPSULE | Freq: Two times a day (BID) | ORAL | Status: DC

## 2014-09-02 MED ORDER — DICLOFENAC SODIUM 50 MG PO TBEC: 50.0000 mg | DELAYED_RELEASE_TABLET | Freq: Three times a day (TID) | ORAL | Status: DC | PRN

## 2014-09-02 NOTE — Progress Notes (Signed)
   Subjective:    Patient ID: Mariah Rice, female    DOB: 16-Nov-1971, 43 y.o.   MRN: [id number]  HPI CC: f/u for abdominal pain  Pain slightly improved and no constipation from narcotics. HIDA unrevealing.  Past medical history, surgical, family, and social history reviewed and updated in the EMR. No new updates were made today.   Review of Systems no heartburn, no CP, no SOB, +nausea, no vomiting. No headache, no dizziness, no dysuria. No rectal bleeding    Objective:   Physical Exam Vitals reviewed  General: NAD CV: RRR, normal s1/s2, no murmurs. Resp: CTAB, normal effort  Abdomen: soft, obese, diffusely tender to deep palpation, no rebound or guarding. Bowel sounds present. Skin: no rashes  Neuro: alert and oriented, no focal deficits     Assessment & Plan:

## 2014-09-02 NOTE — Assessment & Plan Note (Addendum)
Slight improvement in pain, still having symptoms, HIDA relatively unhelpful, biliary sludging on CT, will treat for nonspecific gallstone/bladder disease - trial of ursodiol and diclofenac - rtc in 1 month or if worsening - consider surg ref vs. ultrasound if not improving with these

## 2014-09-02 NOTE — Patient Instructions (Signed)
I am prescribing diclofenac and actigall for your gall bladder pain. If your symptoms are not improving by the next time I see you we should talk about sending you to a surgeon.   Please come back in 1 month.

## 2014-09-03 ENCOUNTER — Encounter: Payer: Self-pay | Admitting: Family Medicine

## 2014-09-03 DIAGNOSIS — K8020 Calculus of gallbladder without cholecystitis without obstruction: Secondary | ICD-10-CM

## 2014-09-05 ENCOUNTER — Encounter: Payer: Self-pay | Admitting: Family Medicine

## 2014-09-06 ENCOUNTER — Other Ambulatory Visit: Payer: Self-pay | Admitting: Family Medicine

## 2014-09-06 ENCOUNTER — Encounter: Payer: Self-pay | Admitting: Family Medicine

## 2014-09-12 ENCOUNTER — Other Ambulatory Visit: Payer: Self-pay | Admitting: *Deleted

## 2014-09-12 ENCOUNTER — Other Ambulatory Visit: Payer: Self-pay | Admitting: Family Medicine

## 2014-09-13 ENCOUNTER — Other Ambulatory Visit: Payer: Self-pay | Admitting: Family Medicine

## 2014-09-13 DIAGNOSIS — F411 Generalized anxiety disorder: Secondary | ICD-10-CM

## 2014-09-14 ENCOUNTER — Other Ambulatory Visit (INDEPENDENT_AMBULATORY_CARE_PROVIDER_SITE_OTHER): Payer: Self-pay

## 2014-09-14 DIAGNOSIS — R1011 Right upper quadrant pain: Secondary | ICD-10-CM

## 2014-09-15 ENCOUNTER — Other Ambulatory Visit: Payer: Self-pay | Admitting: Family Medicine

## 2014-09-15 DIAGNOSIS — F411 Generalized anxiety disorder: Secondary | ICD-10-CM

## 2014-09-15 MED ORDER — ALPRAZOLAM 0.5 MG PO TABS: 0.2500 mg | ORAL_TABLET | Freq: Two times a day (BID) | ORAL | Status: DC | PRN

## 2014-09-23 ENCOUNTER — Ambulatory Visit
Admission: RE | Admit: 2014-09-23 | Discharge: 2014-09-23 | Disposition: A | Discharge status: Home / Self Care | Payer: BC Managed Care – PPO | Source: Ambulatory Visit | Attending: General Surgery | Admitting: General Surgery

## 2014-09-23 DIAGNOSIS — R1011 Right upper quadrant pain: Secondary | ICD-10-CM

## 2014-10-04 ENCOUNTER — Ambulatory Visit: Payer: BC Managed Care – PPO | Admitting: Family Medicine

## 2014-10-11 ENCOUNTER — Other Ambulatory Visit: Payer: Self-pay | Admitting: Family Medicine

## 2014-10-11 DIAGNOSIS — F411 Generalized anxiety disorder: Secondary | ICD-10-CM

## 2014-10-12 MED ORDER — ALPRAZOLAM 0.5 MG PO TABS: 0.2500 mg | ORAL_TABLET | Freq: Two times a day (BID) | ORAL | Status: DC | PRN

## 2014-11-01 IMAGING — CR DG CHEST 2V
2 series · 2 of 2 positions shown · non-contrast
Comparison: 03/16/2012.

CLINICAL DATA: Chest pain, cough

EXAM:
CHEST  2 VIEW

[w chest pa]
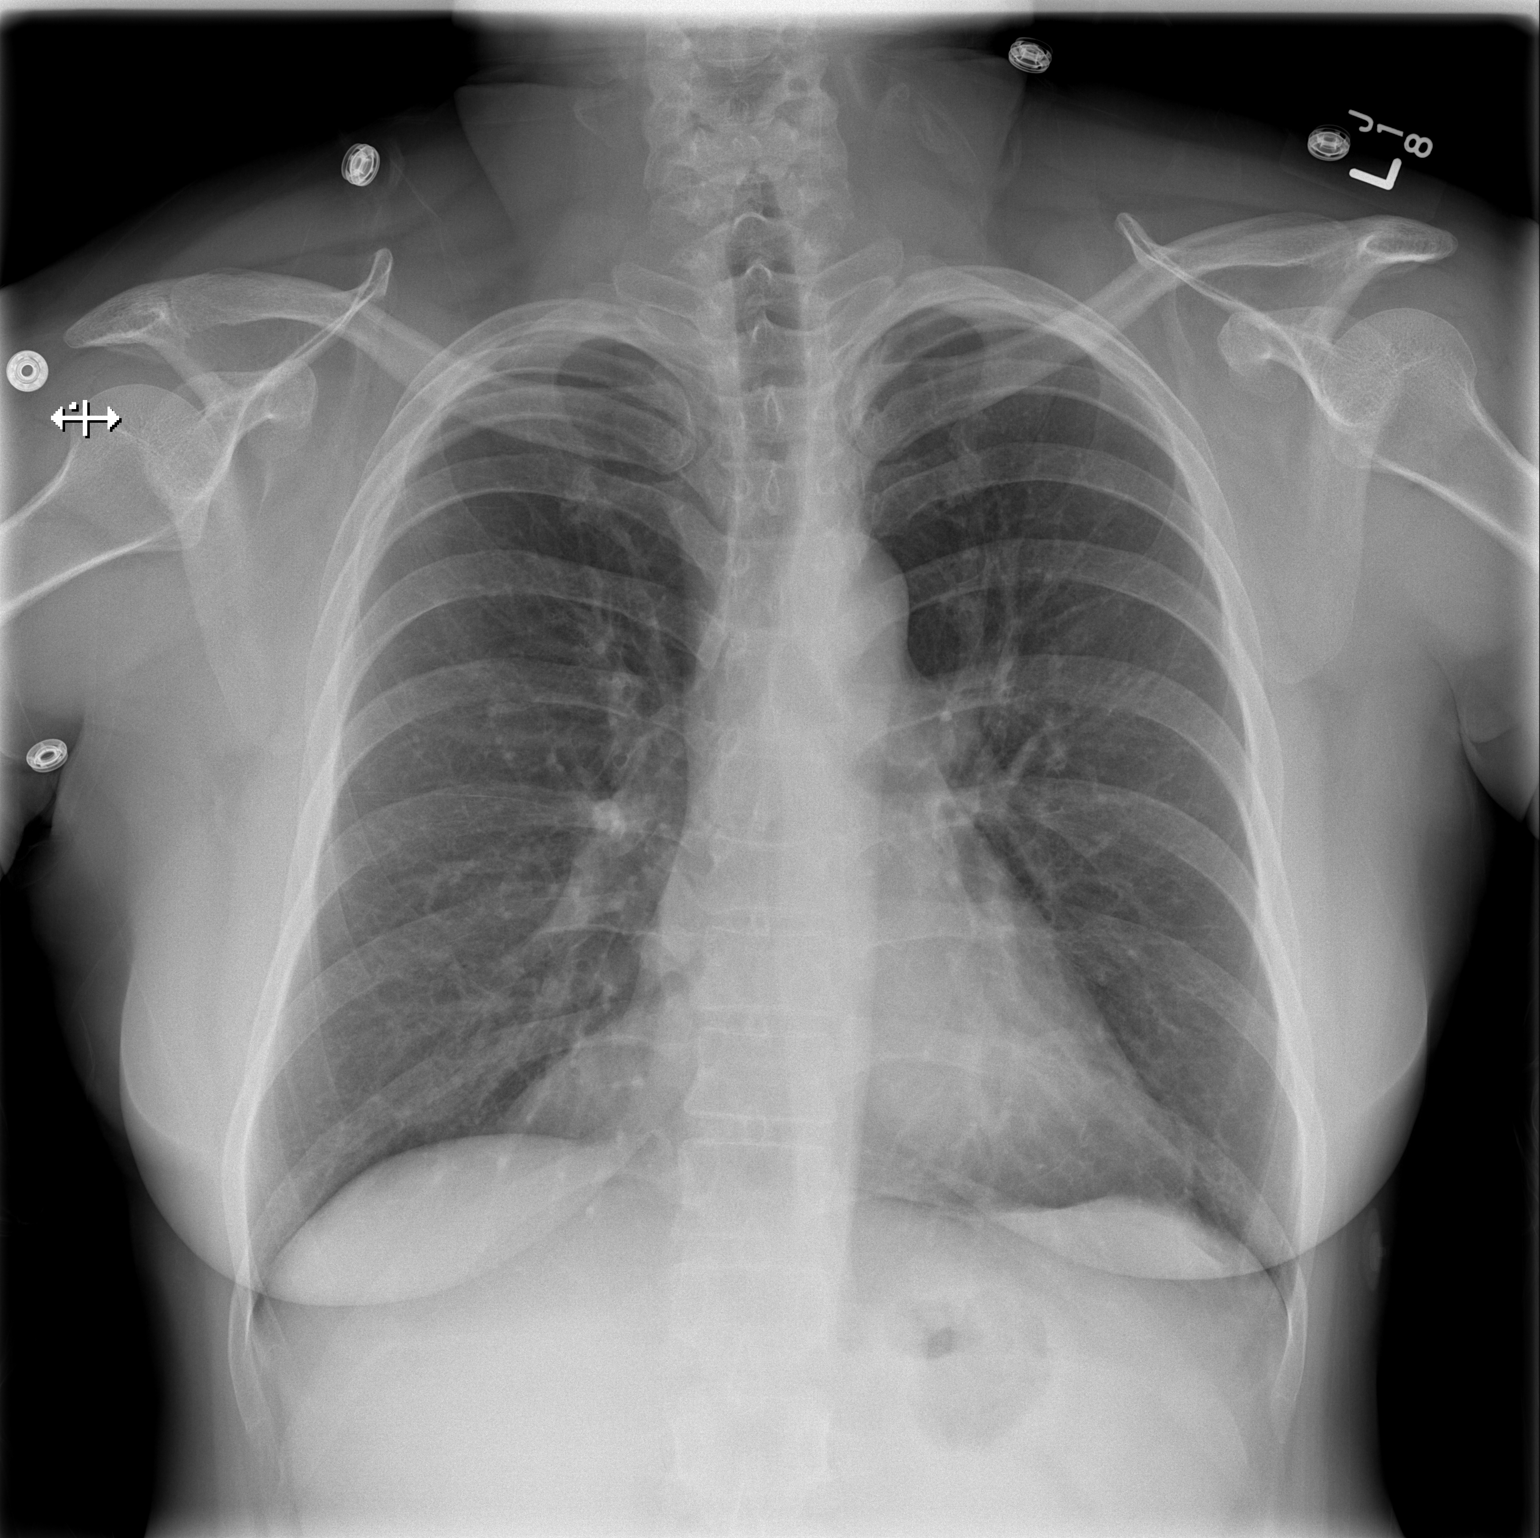

[w chest lat]
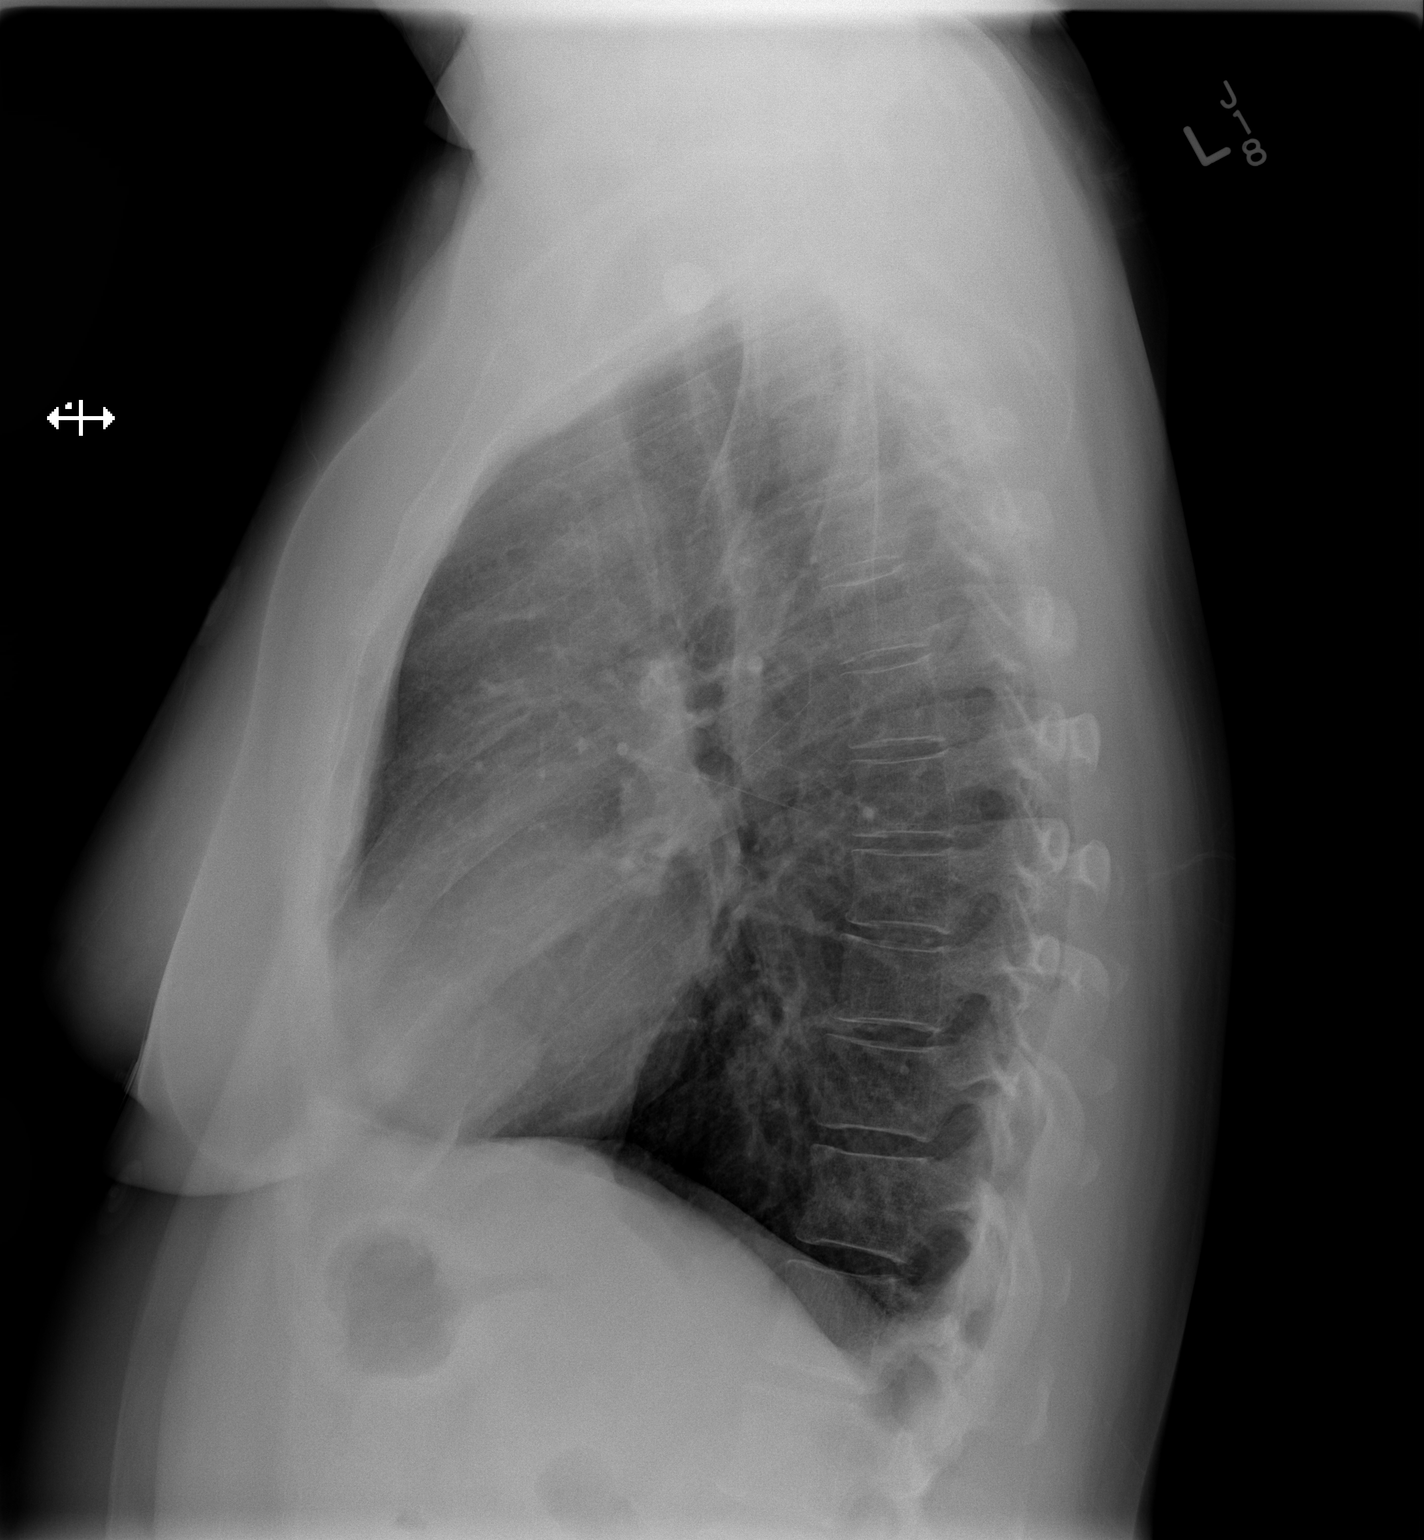

[2 of 2 positions shown; findings below may reference images not displayed]

FINDINGS: The heart size and mediastinal contours are within normal limits.
Both lungs are clear. The visualized skeletal structures are
unremarkable.
IMPRESSION: No active cardiopulmonary disease.

## 2014-11-09 ENCOUNTER — Other Ambulatory Visit: Payer: Self-pay | Admitting: Family Medicine

## 2014-11-09 DIAGNOSIS — F411 Generalized anxiety disorder: Secondary | ICD-10-CM

## 2014-11-09 DIAGNOSIS — Z9109 Other allergy status, other than to drugs and biological substances: Secondary | ICD-10-CM

## 2014-11-09 MED ORDER — FLUTICASONE PROPIONATE 50 MCG/ACT NA SUSP: 2.0000 | Freq: Every day | NASAL | Status: DC

## 2014-11-09 MED ORDER — HYDROCHLOROTHIAZIDE 25 MG PO TABS: 25.0000 mg | ORAL_TABLET | Freq: Every day | ORAL | Status: DC

## 2014-11-09 NOTE — Telephone Encounter (Signed)
Pt called and needs the following medications called in. Ibuprofen, Xanax, Flonase, and her BP medication.jw

## 2014-11-09 NOTE — Telephone Encounter (Signed)
I will refill everything but the xanax. We will need to discuss her anxiety further in clinic before that can be refilled.

## 2014-11-14 ENCOUNTER — Other Ambulatory Visit: Payer: Self-pay | Admitting: Family Medicine

## 2014-11-14 DIAGNOSIS — F411 Generalized anxiety disorder: Secondary | ICD-10-CM

## 2014-11-14 NOTE — Telephone Encounter (Signed)
As the patient has discussed with Dr. Marily Memos and myself in the past, the xanax is not intended to be taken consistently. If she is requiring it daily then she needs to be seen in clinic so we can discuss other treatments for her anxiety as the xanax is not safe to take long-term. I do not feel comfortable refilling it again without further discussion in person.

## 2014-11-14 NOTE — Telephone Encounter (Signed)
Pt called and needs a refill on her Xanax left up front for pick up. jw

## 2014-11-15 NOTE — Telephone Encounter (Signed)
Patient scheduled for 12/14 with PCP, would like enough meds to make it to appointment, will forward to MD.

## 2014-11-16 ENCOUNTER — Other Ambulatory Visit: Payer: Self-pay | Admitting: *Deleted

## 2014-11-16 DIAGNOSIS — F411 Generalized anxiety disorder: Secondary | ICD-10-CM

## 2014-11-16 MED ORDER — ALPRAZOLAM 0.5 MG PO TABS: 0.2500 mg | ORAL_TABLET | Freq: Two times a day (BID) | ORAL | Status: DC | PRN

## 2014-11-16 NOTE — Telephone Encounter (Signed)
Please call in 10 of these if you can to carry her over. Thanks!

## 2014-11-16 NOTE — Telephone Encounter (Signed)
Xanax 0.5 mg tablet: Take 0.5 tablets (0.25 mg total) by mouth 2 (two) times daily as needed for sleep or anxiety #10, No refills called into [facility] per verbal order by Dr. Sherril Cong.  Derl Barrow, RN

## 2014-11-28 ENCOUNTER — Ambulatory Visit (INDEPENDENT_AMBULATORY_CARE_PROVIDER_SITE_OTHER): Payer: Self-pay | Admitting: Family Medicine

## 2014-11-28 ENCOUNTER — Encounter: Payer: Self-pay | Admitting: Family Medicine

## 2014-11-28 DIAGNOSIS — F32A Depression, unspecified: Secondary | ICD-10-CM

## 2014-11-28 DIAGNOSIS — F329 Major depressive disorder, single episode, unspecified: Secondary | ICD-10-CM

## 2014-11-28 MED ORDER — ARIPIPRAZOLE 5 MG PO TABS: 5.0000 mg | ORAL_TABLET | Freq: Every day | ORAL | Status: DC

## 2014-11-28 MED ORDER — ALPRAZOLAM 0.5 MG PO TABS: 0.2500 mg | ORAL_TABLET | Freq: Two times a day (BID) | ORAL | Status: DC | PRN

## 2014-11-28 NOTE — Patient Instructions (Signed)
Aripiprazole tablets What is this medicine? ARIPIPRAZOLE (ay ri PIP ray zole) is an atypical antipsychotic. It is used to treat schizophrenia and bipolar disorder, also known as manic-depression. It is also used to treat Tourette's disorder and some symptoms of autism. This medicine may also be used in combination with antidepressants to treat major depressive disorder. This medicine may be used for other purposes; ask your health care provider or pharmacist if you have questions. COMMON BRAND NAME(S): Abilify What should I tell my health care provider before I take this medicine? They need to know if you have any of these conditions: -dehydration -dementia -diabetes -heart disease -history of stroke -low blood counts, like low white cell, platelet, or red cell counts -Parkinson's disease -seizures -suicidal thoughts, plans, or attempt; a previous suicide attempt by you or a family member -an unusual or allergic reaction to aripiprazole, other medicines, foods, dyes, or preservatives -pregnant or trying to get pregnant -breast-feeding How should I use this medicine? Take this medicine by mouth with a glass of water. Follow the directions on the prescription label. You can take this medicine with or without food. Take your doses at regular intervals. Do not take your medicine more often than directed. Do not stop taking except on the advice of your doctor or health care professional. A special MedGuide will be given to you by the pharmacist with each prescription and refill. Be sure to read this information carefully each time. Talk to your pediatrician regarding the use of this medicine in children. While this drug may be prescribed for children as young as 6 years of age for selected conditions, precautions do apply. Overdosage: If you think you have taken too much of this medicine contact a poison control center or emergency room at once. NOTE: This medicine is only for you. Do not share  this medicine with others. What if I miss a dose? If you miss a dose, take it as soon as you can. If it is almost time for your next dose, take only that dose. Do not take double or extra doses. What may interact with this medicine? Do not take this medicine with any of the following medications: -certain medicines for fungal infections like fluconazole, itraconazole, ketoconazole, posaconazole, voriconazole -cisapride -dofetilide -dronedarone -pimozide -thioridazine -ziprasidone This medicine may also interact with the following medications: -carbamazepine -certain medicines for blood pressure -erythromycin -fluoxetine -grapefruit juice -other medicines that prolong the QT interval (cause an abnormal heart rhythm) -paroxetine -quinidine -valproic acid This list may not describe all possible interactions. Give your health care provider a list of all the medicines, herbs, non-prescription drugs, or dietary supplements you use. Also tell them if you smoke, drink alcohol, or use illegal drugs. Some items may interact with your medicine. What should I watch for while using this medicine? Visit your doctor or health care professional for regular checks on your progress. It may be several weeks before you see the full effects of this medicine. Do not suddenly stop taking this medicine. You may need to gradually reduce the dose. Patients and their families should watch out for worsening depression or thoughts of suicide. Also watch out for sudden changes in feelings such as feeling anxious, agitated, panicky, irritable, hostile, aggressive, impulsive, severely restless, overly excited and hyperactive, or not being able to sleep. If this happens, especially at the beginning of antidepressant treatment or after a change in dose, call your health care professional. You may get dizzy or drowsy. Do not drive, use machinery, or   do anything that needs mental alertness until you know how this medicine  affects you. Do not stand or sit up quickly, especially if you are an older patient. This reduces the risk of dizzy or fainting spells. Alcohol can increase dizziness and drowsiness. Avoid alcoholic drinks. This medicine can reduce the response of your body to heat or cold. Dress warm in cold weather and stay hydrated in hot weather. If possible, avoid extreme temperatures like saunas, hot tubs, very hot or cold showers, or activities that can cause dehydration such as vigorous exercise. This medicine may cause dry eyes and blurred vision. If you wear contact lenses you may feel some discomfort. Lubricating drops may help. See your eye doctor if the problem does not go away or is severe. If you notice an increased hunger or thirst, different from your normal hunger or thirst, or if you find that you have to urinate more frequently, you should contact your health care provider as soon as possible. You may need to have your blood sugar monitored. This medicine may cause changes in your blood sugar levels. You should monitor you blood sugar frequently if you have diabetes. What side effects may I notice from receiving this medicine? Side effects that you should report to your doctor or health care professional as soon as possible: -allergic reactions like skin rash, itching or hives, swelling of the face, lips, or tongue -breathing problems -confusion -feeling faint or lightheaded, falls -fever or chills, sore throat -increased hunger or thirst -increased urination -joint pain -muscles pain, spasms -problems with balance, talking, walking -restlessness or need to keep moving -seizures -suicidal thoughts or other mood changes -trouble swallowing -uncontrollable head, mouth, neck, arm, or leg movements -unusually weak or tired Side effects that usually do not require medical attention (report to your doctor or health care professional if they continue or are bothersome): -blurred  vision -constipation -headache -nausea, vomiting -trouble sleeping -weight gain This list may not describe all possible side effects. Call your doctor for medical advice about side effects. You may report side effects to FDA at 1-800-FDA-1088. Where should I keep my medicine? Keep out of the reach of children. Store at room temperature between 15 and 30 degrees C (59 and 86 degrees F). Throw away any unused medicine after the expiration date. NOTE: This sheet is a summary. It may not cover all possible information. If you have questions about this medicine, talk to your doctor, pharmacist, or health care provider.  2015, [employer]. (2013-12-29 12:40:07)  

## 2014-12-04 NOTE — Assessment & Plan Note (Signed)
High levels of stress and anxiety over the past few months from death of father and upcoming holidays, taking her xanax most days compared to previously very rarely - discussed dangers of benzos and need to add another med to help decrease her mood disturbance, patient is in agreement - previously has been on buspar but did not feel it was helpful - continue celexa and add abilify 10mg  - refill xanax with caution that daily use be avoided and early refills will not be given - f/u in 1 month

## 2014-12-04 NOTE — Progress Notes (Signed)
   Subjective:    Patient ID: Mariah Rice, female    DOB: Jul 19, 1971, 43 y.o.   MRN: [id number]  HPI Pt presents for f/u of anxiety/depression. She reports high levels of stress and anxiety over the past few months from death of father and upcoming holidays. She has been taking her xanax most days compared to previously very rarely. She reports feeling very overwhelmed at times and like she is going to explode. She also reports depressed mood and trouble motivating herself to get things done. She has trouble sleeping almost nightly. Denies SI, HI, hallucinations or other thought disturbance.   She is making positive lifestyle changes and I gave her lots of support and encouragement on these. She has cut down her smoking to about 3-4 cigarettes per day and has lost ~15 lbs in the last 6 months through cutting down on sweets and fast food.   Review of Systems See HPI    Objective:   Physical Exam  Constitutional: She is oriented to person, place, and time. She appears well-developed and well-nourished. No distress.  HENT:  Head: Normocephalic and atraumatic.  Eyes: Conjunctivae are normal. Right eye exhibits no discharge. Left eye exhibits no discharge. No scleral icterus.  Cardiovascular: Normal rate.   Pulmonary/Chest: Effort normal.  Abdominal: She exhibits no distension.  Neurological: She is alert and oriented to person, place, and time.  Skin: Skin is warm and dry. She is not diaphoretic.  Psychiatric: She has a normal mood and affect. Her behavior is normal.  Somewhat anxious at start of visit but calm and pleasant by the end  Nursing note and vitals reviewed.         Assessment & Plan:

## 2014-12-05 ENCOUNTER — Ambulatory Visit: Payer: BC Managed Care – PPO | Admitting: Family Medicine

## 2014-12-05 ENCOUNTER — Encounter: Payer: Self-pay | Admitting: Family Medicine

## 2014-12-12 ENCOUNTER — Encounter: Payer: Self-pay | Admitting: Family Medicine

## 2014-12-12 ENCOUNTER — Other Ambulatory Visit (HOSPITAL_BASED_OUTPATIENT_CLINIC_OR_DEPARTMENT_OTHER): Payer: Self-pay | Admitting: Family Medicine

## 2014-12-19 ENCOUNTER — Encounter: Payer: Self-pay | Admitting: Family Medicine

## 2014-12-19 ENCOUNTER — Ambulatory Visit (INDEPENDENT_AMBULATORY_CARE_PROVIDER_SITE_OTHER): Payer: BC Managed Care – PPO | Admitting: Family Medicine

## 2014-12-19 VITALS — BP 147/78 | HR 76 | Temp 98.9°F | Ht 64.0 in | Wt 190.0 lb

## 2014-12-19 DIAGNOSIS — D367 Benign neoplasm of other specified sites: Secondary | ICD-10-CM

## 2014-12-19 DIAGNOSIS — N939 Abnormal uterine and vaginal bleeding, unspecified: Secondary | ICD-10-CM

## 2014-12-19 DIAGNOSIS — D2362 Other benign neoplasm of skin of left upper limb, including shoulder: Secondary | ICD-10-CM

## 2014-12-19 MED ORDER — NORETHINDRONE 0.35 MG PO TABS: 1.0000 | ORAL_TABLET | Freq: Every day | ORAL | Status: DC

## 2014-12-19 NOTE — Assessment & Plan Note (Signed)
Irregular periods for the past few months, also with hot flashes, mood swings, vaginal dryness and changes to sexual function. Grandmother and aunt went through menopause at 59 - likely perimenopausal, managing mood symptoms with meds - trial of progesterone to regulate bleeding - f/u in 3 months, sooner if not helping

## 2014-12-19 NOTE — Progress Notes (Signed)
   Subjective:    Patient ID: Mariah Rice, female    DOB: 08-19-1971, 44 y.o.   MRN: [id number]  HPI Pt presents for irregular periods and concern that she may be going through menopause. She reports that in December she had 2 periods separated by 1.5 weeks and this has happened a few times in the past few month. They are unpredictable. She is also having mood disturbance (in treatment for anxiety/depression), hot flashes, changes to sexual function.  She also reports a bump on her left elbow that is painful when she put pressure on it like when leaning on the table but otherwise does not give her any trouble. She noticed it a few weeks ago and hasn't noticed any change so far.  Review of Systems  Constitutional: Positive for diaphoresis.  Psychiatric/Behavioral: Positive for sleep disturbance and dysphoric mood. The patient is nervous/anxious.   All other systems reviewed and are negative.      Objective:   Physical Exam  Constitutional: She is oriented to person, place, and time. She appears well-developed and well-nourished. No distress.  HENT:  Head: Normocephalic and atraumatic.  Eyes: Conjunctivae are normal. Right eye exhibits no discharge. Left eye exhibits no discharge. No scleral icterus.  Cardiovascular: Normal rate.   Pulmonary/Chest: Effort normal.  Abdominal: She exhibits no distension.  Musculoskeletal:       Arms: Neurological: She is alert and oriented to person, place, and time.  Skin: Skin is warm and dry. No rash noted. She is not diaphoretic.  Psychiatric: She has a normal mood and affect. Her behavior is normal.  Nursing note and vitals reviewed.         Assessment & Plan:

## 2014-12-19 NOTE — Assessment & Plan Note (Signed)
51mm cyst over elbow, painful with direct pressure as when leaning on table, nontender on exam - monitor, rtc if inflamed, growing, etc

## 2014-12-19 NOTE — Patient Instructions (Signed)
Perimenopause Perimenopause is the time when your body begins to move into the menopause (no menstrual period for 12 straight months). It is a natural process. Perimenopause can begin 2-8 years before the menopause and usually lasts for 1 year after the menopause. During this time, your ovaries may or may not produce an egg. The ovaries vary in their production of estrogen and progesterone hormones each month. This can cause irregular menstrual periods, difficulty getting pregnant, vaginal bleeding between periods, and uncomfortable symptoms. CAUSES  Irregular production of the ovarian hormones, estrogen and progesterone, and not ovulating every month.  Other causes include:  Tumor of the pituitary gland in the brain.  Medical disease that affects the ovaries.  Radiation treatment.  Chemotherapy.  Unknown causes.  Heavy smoking and excessive alcohol intake can bring on perimenopause sooner. SIGNS AND SYMPTOMS   Hot flashes.  Night sweats.  Irregular menstrual periods.  Decreased sex drive.  Vaginal dryness.  Headaches.  Mood swings.  Depression.  Memory problems.  Irritability.  Tiredness.  Weight gain.  Trouble getting pregnant.  The beginning of losing bone cells (osteoporosis).  The beginning of hardening of the arteries (atherosclerosis). DIAGNOSIS  Your health care provider will make a diagnosis by analyzing your age, menstrual history, and symptoms. He or she will do a physical exam and note any changes in your body, especially your female organs. Female hormone tests may or may not be helpful depending on the amount of female hormones you produce and when you produce them. However, other hormone tests may be helpful to rule out other problems. TREATMENT  In some cases, no treatment is needed. The decision on whether treatment is necessary during the perimenopause should be made by you and your health care provider based on how the symptoms are affecting you  and your lifestyle. Various treatments are available, such as:  Treating individual symptoms with a specific medicine for that symptom.  Herbal medicines that can help specific symptoms, such as black cohosh.  Counseling.  Group therapy. HOME CARE INSTRUCTIONS   Keep track of your menstrual periods (when they occur, how heavy they are, how long between periods, and how long they last) as well as your symptoms and when they started.  Only take over-the-counter or prescription medicines as directed by your health care provider.  Sleep and rest.  Exercise.  Eat a diet that contains calcium (good for your bones) and soy (acts like the estrogen hormone).  Do not smoke.  Avoid alcoholic beverages.  Take vitamin supplements as recommended by your health care provider. Taking vitamin E may help in certain cases.  Take calcium and vitamin D supplements to help prevent bone loss.  Group therapy is sometimes helpful.  Acupuncture may help in some cases. SEEK MEDICAL CARE IF:   You have questions about any symptoms you are having.  You need a referral to a specialist (gynecologist, psychiatrist, or psychologist). SEEK IMMEDIATE MEDICAL CARE IF:   You have vaginal bleeding.  Your period lasts longer than 8 days.  Your periods are recurring sooner than 21 days.  You have bleeding after intercourse.  You have severe depression.  You have pain when you urinate.  You have severe headaches.  You have vision problems. Document Released: 01/09/2005 Document Revised: 09/22/2013 Document Reviewed: 07/01/2013 [facility] Patient Information 2015 [place], [place]. This information is not intended to replace advice given to you by your health care provider. Make sure you discuss any questions you have with your health care provider.

## 2014-12-27 ENCOUNTER — Ambulatory Visit: Payer: Self-pay | Admitting: Family Medicine

## 2015-01-04 ENCOUNTER — Other Ambulatory Visit: Payer: Self-pay | Admitting: *Deleted

## 2015-01-04 ENCOUNTER — Encounter: Payer: Self-pay | Admitting: Family Medicine

## 2015-01-04 DIAGNOSIS — F329 Major depressive disorder, single episode, unspecified: Secondary | ICD-10-CM

## 2015-01-04 DIAGNOSIS — F32A Depression, unspecified: Secondary | ICD-10-CM

## 2015-01-04 NOTE — Telephone Encounter (Signed)
Requesting a month supply. Derl Barrow, RN

## 2015-01-05 ENCOUNTER — Other Ambulatory Visit: Payer: Self-pay | Admitting: *Deleted

## 2015-01-05 DIAGNOSIS — F32A Depression, unspecified: Secondary | ICD-10-CM

## 2015-01-05 DIAGNOSIS — F329 Major depressive disorder, single episode, unspecified: Secondary | ICD-10-CM

## 2015-01-10 ENCOUNTER — Encounter: Payer: Self-pay | Admitting: Family Medicine

## 2015-01-10 NOTE — Telephone Encounter (Signed)
2nd request.  Araiyah Cumpton L, RN  

## 2015-01-11 MED ORDER — ALPRAZOLAM 0.5 MG PO TABS: 0.5000 mg | ORAL_TABLET | Freq: Two times a day (BID) | ORAL | Status: DC | PRN

## 2015-01-17 ENCOUNTER — Other Ambulatory Visit: Payer: Self-pay | Admitting: Family Medicine

## 2015-01-18 NOTE — Telephone Encounter (Signed)
I gave her #10 while you were gone just until you got back, so she's requesting a full refill.

## 2015-01-31 ENCOUNTER — Other Ambulatory Visit: Payer: Self-pay | Admitting: Family Medicine

## 2015-01-31 ENCOUNTER — Encounter: Payer: Self-pay | Admitting: Family Medicine

## 2015-01-31 DIAGNOSIS — I10 Essential (primary) hypertension: Secondary | ICD-10-CM

## 2015-01-31 MED ORDER — HYDROCHLOROTHIAZIDE 25 MG PO TABS: 25.0000 mg | ORAL_TABLET | Freq: Every day | ORAL | Status: DC

## 2015-02-01 ENCOUNTER — Encounter: Payer: Self-pay | Admitting: Family Medicine

## 2015-02-02 ENCOUNTER — Emergency Department (HOSPITAL_COMMUNITY)
Admission: EM | Admit: 2015-02-02 | Discharge: 2015-02-02 | Disposition: A | Discharge status: Home / Self Care | Payer: 59 | Attending: Emergency Medicine | Admitting: Emergency Medicine

## 2015-02-02 ENCOUNTER — Emergency Department (HOSPITAL_COMMUNITY): Payer: 59

## 2015-02-02 ENCOUNTER — Other Ambulatory Visit: Payer: Self-pay | Admitting: Family Medicine

## 2015-02-02 ENCOUNTER — Encounter (HOSPITAL_COMMUNITY): Payer: Self-pay

## 2015-02-02 DIAGNOSIS — F329 Major depressive disorder, single episode, unspecified: Secondary | ICD-10-CM | POA: Insufficient documentation

## 2015-02-02 DIAGNOSIS — I10 Essential (primary) hypertension: Secondary | ICD-10-CM | POA: Diagnosis not present

## 2015-02-02 DIAGNOSIS — Y9289 Other specified places as the place of occurrence of the external cause: Secondary | ICD-10-CM | POA: Insufficient documentation

## 2015-02-02 DIAGNOSIS — S93402A Sprain of unspecified ligament of left ankle, initial encounter: Secondary | ICD-10-CM

## 2015-02-02 DIAGNOSIS — F419 Anxiety disorder, unspecified: Secondary | ICD-10-CM | POA: Insufficient documentation

## 2015-02-02 DIAGNOSIS — Z7951 Long term (current) use of inhaled steroids: Secondary | ICD-10-CM | POA: Insufficient documentation

## 2015-02-02 DIAGNOSIS — Z720 Tobacco use: Secondary | ICD-10-CM | POA: Diagnosis not present

## 2015-02-02 DIAGNOSIS — X58XXXA Exposure to other specified factors, initial encounter: Secondary | ICD-10-CM | POA: Diagnosis not present

## 2015-02-02 DIAGNOSIS — Y998 Other external cause status: Secondary | ICD-10-CM | POA: Insufficient documentation

## 2015-02-02 DIAGNOSIS — Z79899 Other long term (current) drug therapy: Secondary | ICD-10-CM | POA: Insufficient documentation

## 2015-02-02 DIAGNOSIS — Z880 Allergy status to penicillin: Secondary | ICD-10-CM | POA: Insufficient documentation

## 2015-02-02 DIAGNOSIS — Y9389 Activity, other specified: Secondary | ICD-10-CM | POA: Insufficient documentation

## 2015-02-02 DIAGNOSIS — Z791 Long term (current) use of non-steroidal anti-inflammatories (NSAID): Secondary | ICD-10-CM | POA: Insufficient documentation

## 2015-02-02 DIAGNOSIS — Z8719 Personal history of other diseases of the digestive system: Secondary | ICD-10-CM | POA: Insufficient documentation

## 2015-02-02 DIAGNOSIS — S99912A Unspecified injury of left ankle, initial encounter: Secondary | ICD-10-CM | POA: Diagnosis present

## 2015-02-02 MED ORDER — IBUPROFEN 800 MG PO TABS: 800.0000 mg | ORAL_TABLET | Freq: Three times a day (TID) | ORAL | Status: DC

## 2015-02-02 MED ORDER — NICOTINE 21 MG/24HR TD PT24: 21.0000 mg | MEDICATED_PATCH | Freq: Every day | TRANSDERMAL | Status: DC

## 2015-02-02 NOTE — ED Provider Notes (Signed)
CSN: [id number]     Arrival date & time 02/02/15  1201 History  This chart was scribed for non-physician practitioner, Carman Ching, PA-C, working with Orlie Dakin, MD, by Stephania Fragmin, ED Scribe. This patient was seen in room WTR7/WTR7 and the patient's care was started at 12:57 PM.    Chief Complaint  Patient presents with  . Ankle Pain   The history is provided by the patient. No language interpreter was used.     HPI Comments: Mariah Rice is a 44 y.o. female who presents to the Emergency Department complaining of 9/10 left ankle pain following an injury that occurred when patient was walking out the door of her workplace earlier today. She reports she tripped, twisted her ankle, and landed on the cement. Patient states had hurt the same ankle a long time ago. Dorsiflexioin exacerbates the pain. She denies any numbness, tingling, or calf pain.  Past Medical History  Diagnosis Date  . Depression   . Anxiety   . Dental abscess   . Hypertension    Past Surgical History  Procedure Laterality Date  . Tubal ligation  2000   Family History  Problem Relation Age of Onset  . Cancer Mother     cervical cancer  . Heart disease Father   . Hypertension Father   . COPD Father   . Hypertension Paternal Aunt   . Thyroid disease Maternal Grandmother   . Hypertension Paternal Grandmother   . Heart disease Paternal Grandmother    History  Substance Use Topics  . Smoking status: Current Every Day Smoker -- 0.50 packs/day  . Smokeless tobacco: Not on file  . Alcohol Use: Yes     Comment: occ   OB History    No data available     Review of Systems  Musculoskeletal: Positive for arthralgias.  Neurological: Negative for numbness.  All other systems reviewed and are negative.     Allergies  Penicillins and Sulfa antibiotics  Home Medications   Prior to Admission medications   Medication Sig Start Date End Date Taking? Authorizing Provider  ALPRAZolam Duanne Moron) 0.5 MG tablet  Take 0.5 tablets (0.25 mg total) by mouth 2 (two) times daily as needed for sleep or anxiety. 11/28/14   [name], MD  ALPRAZolam Duanne Moron) 0.5 MG tablet Take 1 tablet (0.5 mg total) by mouth 2 (two) times daily as needed for anxiety. 01/11/15   Patrecia Pour, MD  ARIPiprazole (ABILIFY) 5 MG tablet Take 1 tablet (5 mg total) by mouth daily. 11/28/14   [name], MD  chlorhexidine (PERIDEX) 0.12 % solution Use as directed 15 mLs in the mouth or throat 2 (two) times daily. 04/08/14   Waldemar Dickens, MD  citalopram (CELEXA) 40 MG tablet Take 1 tablet (40 mg total) by mouth daily. 06/13/14   Waldemar Dickens, MD  diclofenac (VOLTAREN) 50 MG EC tablet Take 1 tablet (50 mg total) by mouth 3 (three) times daily as needed for moderate pain. 09/02/14   [name], MD  fluticasone (FLONASE) 50 MCG/ACT nasal spray Place 2 sprays into both nostrils daily. 11/09/14   [name], MD  hydrochlorothiazide (HYDRODIURIL) 25 MG tablet Take 1 tablet (25 mg total) by mouth daily. 01/31/15   [name], MD  ibuprofen (ADVIL,MOTRIN) 800 MG tablet Take 1 tablet (800 mg total) by mouth 3 (three) times daily. 02/02/15   Garen Woolbright M Eathel Pajak, PA-C  nicotine (NICODERM CQ - DOSED IN MG/24 HOURS) 21  mg/24hr patch Place 1 patch (21 mg total) onto the skin daily. 02/02/15   [name], MD  norethindrone (MICRONOR,CAMILA,ERRIN) 0.35 MG tablet Take 1 tablet (0.35 mg total) by mouth daily. 12/19/14   [name], MD  polyethylene glycol powder (GLYCOLAX/MIRALAX) powder Take 17 g by mouth 2 (two) times daily as needed. 08/18/14   Leone Brand, MD  ursodiol (ACTIGALL) 300 MG capsule Take 1 capsule (300 mg total) by mouth 2 (two) times daily. 09/02/14   [name], MD   BP 144/93 mmHg  Pulse 81  Temp(Src) 98.2 F (36.8 C) (Oral)  Resp 18  SpO2 99%  LMP 01/28/2015 Physical Exam  Constitutional: She is oriented to person, place, and time. She appears well-developed and well-nourished. No distress.  HENT:  Head: Normocephalic  and atraumatic.  Mouth/Throat: Oropharynx is clear and moist.  Eyes: Conjunctivae and EOM are normal.  Neck: Normal range of motion. Neck supple.  Cardiovascular: Normal rate, regular rhythm and normal heart sounds.   Pulmonary/Chest: Effort normal and breath sounds normal. No respiratory distress.  Musculoskeletal:  Left ankle TTP over lateral malleolus, below lateral malleolus and over AITFL with mild swelling. FROM, pain with flexion and inversion. Achilles tendon normal. No tenderness over proximal fibula. +2 PT/DP pulse.  Neurological: She is alert and oriented to person, place, and time. No sensory deficit.  Skin: Skin is warm and dry.  Psychiatric: She has a normal mood and affect. Her behavior is normal.  Nursing note and vitals reviewed.   ED Course  Procedures (including critical care time)  DIAGNOSTIC STUDIES: Oxygen Saturation is 99% on room air, normal by my interpretation.    COORDINATION OF CARE: 1:01 PM - Discussed treatment plan with pt at bedside which includes, pending XR results: crutches, anti-inflammatory elevation, and ice and elevation; and pt agreed to plan.  Imaging Review Dg Ankle Complete Left  02/02/2015   CLINICAL DATA:  Status post fall on steps at home today with twisting injury of the ankle  EXAM: LEFT ANKLE COMPLETE - 3+ VIEW  COMPARISON:  None.  FINDINGS: The ankle joint mortise is preserved. The talar dome is intact. There is no acute malleolar fracture. The talus and calcaneus and other visualized hindfoot bones are unremarkable. There is mild soft tissue swelling anteriorly and medially.  IMPRESSION: There is no acute bony abnormality of the left ankle. There is mild soft tissue swelling.   Electronically Signed   By: David  Martinique   On: 02/02/2015 13:22      MDM   Final diagnoses:  Left ankle sprain, initial encounter   Neurovascularly intact. Xray without acute finding. ACE wrap applied, crutches given. RICE, NSAIDs. Stable for d/c. Return  precautions given. Patient states understanding of treatment care plan and is agreeable.  I personally performed the services described in this documentation, which was scribed in my presence. The recorded information has been reviewed and is accurate.   Carman Ching, PA-C 02/02/15 1329  Orlie Dakin, MD 02/02/15 1700

## 2015-02-02 NOTE — ED Notes (Signed)
Pt presents with c/o left ankle pain after an ankle injury that occurred earlier today. Pt reported that she was coming out of her house, tripped on the stairs, and rolled her ankle. Pt ambulatory to triage.

## 2015-02-02 NOTE — Discharge Instructions (Signed)
Take ibuprofen as directed for pain and swelling.  Ankle Sprain An ankle sprain is an injury to the strong, fibrous tissues (ligaments) that hold the bones of your ankle joint together.  CAUSES An ankle sprain is usually caused by a fall or by twisting your ankle. Ankle sprains most commonly occur when you step on the outer edge of your foot, and your ankle turns inward. People who participate in sports are more prone to these types of injuries.  SYMPTOMS   Pain in your ankle. The pain may be present at rest or only when you are trying to stand or walk.  Swelling.  Bruising. Bruising may develop immediately or within 1 to 2 days after your injury.  Difficulty standing or walking, particularly when turning corners or changing directions. DIAGNOSIS  Your caregiver will ask you details about your injury and perform a physical exam of your ankle to determine if you have an ankle sprain. During the physical exam, your caregiver will press on and apply pressure to specific areas of your foot and ankle. Your caregiver will try to move your ankle in certain ways. An X-ray exam may be done to be sure a bone was not broken or a ligament did not separate from one of the bones in your ankle (avulsion fracture).  TREATMENT  Certain types of braces can help stabilize your ankle. Your caregiver can make a recommendation for this. Your caregiver may recommend the use of medicine for pain. If your sprain is severe, your caregiver may refer you to a surgeon who helps to restore function to parts of your skeletal system (orthopedist) or a physical therapist. [place] ice to your injury for 1-2 days or as directed by your caregiver. Applying ice helps to reduce inflammation and pain.  Put ice in a plastic bag.  Place a towel between your skin and the bag.  Leave the ice on for 15-20 minutes at a time, every 2 hours while you are awake.  Only take over-the-counter or prescription  medicines for pain, discomfort, or fever as directed by your caregiver.  Elevate your injured ankle above the level of your heart as much as possible for 2-3 days.  If your caregiver recommends crutches, use them as instructed. Gradually put weight on the affected ankle. Continue to use crutches or a cane until you can walk without feeling pain in your ankle.  If you have a plaster splint, wear the splint as directed by your caregiver. Do not rest it on anything harder than a pillow for the first 24 hours. Do not put weight on it. Do not get it wet. You may take it off to take a shower or bath.  You may have been given an elastic bandage to wear around your ankle to provide support. If the elastic bandage is too tight (you have numbness or tingling in your foot or your foot becomes cold and blue), adjust the bandage to make it comfortable.  If you have an air splint, you may blow more air into it or let air out to make it more comfortable. You may take your splint off at night and before taking a shower or bath. Wiggle your toes in the splint several times per day to decrease swelling. SEEK MEDICAL CARE IF:   You have rapidly increasing bruising or swelling.  Your toes feel extremely cold or you lose feeling in your foot.  Your pain is not relieved with medicine. [place]  CARE IF:  Your toes are numb or blue.  You have severe pain that is increasing. MAKE SURE YOU:   Understand these instructions.  Will watch your condition.  Will get help right away if you are not doing well or get worse. Document Released: 12/02/2005 Document Revised: 08/26/2012 Document Reviewed: 12/14/2011 [facility] Patient Information 2015 [place], [place]. This information is not intended to replace advice given to you by your health care provider. Make sure you discuss any questions you have with your health care provider. RICE: Routine Care for Injuries The routine care of many injuries includes  Rest, Ice, Compression, and Elevation (RICE). HOME CARE INSTRUCTIONS  Rest is needed to allow your body to heal. Routine activities can usually be resumed when comfortable. Injured tendons and bones can take up to 6 weeks to heal. Tendons are the cord-like structures that attach muscle to bone.  Ice following an injury helps keep the swelling down and reduces pain.  Put ice in a plastic bag.  Place a towel between your skin and the bag.  Leave the ice on for 15-20 minutes, 3-4 times a day, or as directed by your health care provider. Do this while awake, for the first 24 to 48 hours. After that, continue as directed by your caregiver.  Compression helps keep swelling down. It also gives support and helps with discomfort. If an elastic bandage has been applied, it should be removed and reapplied every 3 to 4 hours. It should not be applied tightly, but firmly enough to keep swelling down. Watch fingers or toes for swelling, bluish discoloration, coldness, numbness, or excessive pain. If any of these problems occur, remove the bandage and reapply loosely. Contact your caregiver if these problems continue.  Elevation helps reduce swelling and decreases pain. With extremities, such as the arms, hands, legs, and feet, the injured area should be placed near or above the level of the heart, if possible. SEEK IMMEDIATE MEDICAL CARE IF:  You have persistent pain and swelling.  You develop redness, numbness, or unexpected weakness.  Your symptoms are getting worse rather than improving after several days. These symptoms may indicate that further evaluation or further X-rays are needed. Sometimes, X-rays may not show a small broken bone (fracture) until 1 week or 10 days later. Make a follow-up appointment with your caregiver. Ask when your X-ray results will be ready. Make sure you get your X-ray results. Document Released: 03/16/2001 Document Revised: 12/07/2013 Document Reviewed:  05/03/2011 [facility] Patient Information 2015 [place], [place]. This information is not intended to replace advice given to you by your health care provider. Make sure you discuss any questions you have with your health care provider.

## 2015-02-15 ENCOUNTER — Encounter: Payer: Self-pay | Admitting: Family Medicine

## 2015-02-16 ENCOUNTER — Other Ambulatory Visit: Payer: Self-pay | Admitting: Family Medicine

## 2015-02-16 DIAGNOSIS — F32A Depression, unspecified: Secondary | ICD-10-CM

## 2015-02-16 DIAGNOSIS — F329 Major depressive disorder, single episode, unspecified: Secondary | ICD-10-CM

## 2015-02-16 MED ORDER — ARIPIPRAZOLE 10 MG PO TABS: 10.0000 mg | ORAL_TABLET | Freq: Every day | ORAL | Status: DC

## 2015-02-28 ENCOUNTER — Other Ambulatory Visit: Payer: Self-pay | Admitting: *Deleted

## 2015-02-28 DIAGNOSIS — F32A Depression, unspecified: Secondary | ICD-10-CM

## 2015-02-28 DIAGNOSIS — F329 Major depressive disorder, single episode, unspecified: Secondary | ICD-10-CM

## 2015-02-28 MED ORDER — CITALOPRAM HYDROBROMIDE 40 MG PO TABS: 40.0000 mg | ORAL_TABLET | Freq: Every day | ORAL | Status: DC

## 2015-03-12 ENCOUNTER — Encounter: Payer: Self-pay | Admitting: Family Medicine

## 2015-03-13 ENCOUNTER — Other Ambulatory Visit: Payer: Self-pay | Admitting: Family Medicine

## 2015-03-13 DIAGNOSIS — N920 Excessive and frequent menstruation with regular cycle: Secondary | ICD-10-CM

## 2015-03-13 DIAGNOSIS — N921 Excessive and frequent menstruation with irregular cycle: Secondary | ICD-10-CM

## 2015-03-15 ENCOUNTER — Encounter: Payer: Self-pay | Admitting: Family Medicine

## 2015-03-15 ENCOUNTER — Ambulatory Visit (INDEPENDENT_AMBULATORY_CARE_PROVIDER_SITE_OTHER): Payer: 59 | Admitting: Family Medicine

## 2015-03-15 VITALS — BP 135/92 | HR 77 | Ht 64.0 in | Wt 197.0 lb

## 2015-03-15 DIAGNOSIS — F411 Generalized anxiety disorder: Secondary | ICD-10-CM

## 2015-03-15 DIAGNOSIS — R233 Spontaneous ecchymoses: Secondary | ICD-10-CM

## 2015-03-15 DIAGNOSIS — I10 Essential (primary) hypertension: Secondary | ICD-10-CM

## 2015-03-15 DIAGNOSIS — M549 Dorsalgia, unspecified: Secondary | ICD-10-CM | POA: Insufficient documentation

## 2015-03-15 DIAGNOSIS — R609 Edema, unspecified: Secondary | ICD-10-CM

## 2015-03-15 DIAGNOSIS — M5489 Other dorsalgia: Secondary | ICD-10-CM | POA: Insufficient documentation

## 2015-03-15 DIAGNOSIS — F32A Depression, unspecified: Secondary | ICD-10-CM

## 2015-03-15 DIAGNOSIS — F329 Major depressive disorder, single episode, unspecified: Secondary | ICD-10-CM

## 2015-03-15 DIAGNOSIS — N939 Abnormal uterine and vaginal bleeding, unspecified: Secondary | ICD-10-CM

## 2015-03-15 DIAGNOSIS — M545 Low back pain, unspecified: Secondary | ICD-10-CM

## 2015-03-15 DIAGNOSIS — R238 Other skin changes: Secondary | ICD-10-CM | POA: Diagnosis not present

## 2015-03-15 LAB — TSH: TSH: 2.52 u[IU]/mL (ref 0.350–4.500)

## 2015-03-15 LAB — POCT GLYCOSYLATED HEMOGLOBIN (HGB A1C): HEMOGLOBIN A1C: 5.6

## 2015-03-15 MED ORDER — TIZANIDINE HCL 2 MG PO CAPS: 2.0000 mg | ORAL_CAPSULE | Freq: Three times a day (TID) | ORAL | Status: DC | PRN

## 2015-03-15 MED ORDER — ALPRAZOLAM 0.5 MG PO TABS: 0.5000 mg | ORAL_TABLET | Freq: Every evening | ORAL | Status: DC | PRN

## 2015-03-15 MED ORDER — TRIAMTERENE-HCTZ 37.5-25 MG PO TABS: 1.0000 | ORAL_TABLET | Freq: Every day | ORAL | Status: DC

## 2015-03-15 NOTE — Patient Instructions (Signed)
We will call with the gynecology appointment and lab results

## 2015-03-16 LAB — HIV ANTIBODY (ROUTINE TESTING W REFLEX): HIV 1&2 Ab, 4th Generation: NONREACTIVE

## 2015-03-19 NOTE — Assessment & Plan Note (Addendum)
Anxiety and depression symptoms much better since increasing abilify, rare times when she still needs xanax, usually when in crowds - continue celexa and abilify - give additional xanax #10 for occasional prn use - f/u in 1 month

## 2015-03-19 NOTE — Assessment & Plan Note (Addendum)
Just above goal for the past few visits, 135/92 today, patient complaining of diffuse mild edema - will switch from hctz to triamterene-hctz for better bp control plus some additional diuretic - f/u in 1 month

## 2015-03-19 NOTE — Assessment & Plan Note (Addendum)
Cycles somewhat regular for the past few months but very heavy and painful, wants to talk about possible hysterectomy, stopped taking progesterone because it wasn't helping - gynecology referral - will check TSH and cortisol given edema/weight gain with mood symptoms and abnormal uterine bleeding

## 2015-03-19 NOTE — Progress Notes (Signed)
   Subjective:    Patient ID: Mariah Rice, female    DOB: [birth year], 44 y.o.   MRN: [id number]  HPI Pt presents for f/u of menorrhagia, hypertension and depression/anxiety. Reports still heavy and painful periods, regular/monthly at this point. Stopped taking progesterone because she thought it was making it worse.  No SE from antihypertensives, good compliance. Is having more edema diffusely and asking about a water pill.  Reports depression/anxiety doing much better on celexa and abilify. Still having some panic symptoms rarely when in big crowds and requesting a few xanax to use for these situations.    Review of Systems See HPI    Objective:   Physical Exam  Constitutional: She is oriented to person, place, and time. She appears well-developed and well-nourished. No distress.  HENT:  Head: Normocephalic and atraumatic.  Slightly more rounded face compared to prior visits  Eyes: Conjunctivae are normal. Right eye exhibits no discharge. Left eye exhibits no discharge. No scleral icterus.  Cardiovascular: Normal rate, regular rhythm, normal heart sounds and intact distal pulses.   No murmur heard. Pulmonary/Chest: Effort normal and breath sounds normal. No respiratory distress. She has no wheezes.  Abdominal: Soft. Bowel sounds are normal. She exhibits no distension. There is no tenderness.  Musculoskeletal:  No pitting edema  Neurological: She is alert and oriented to person, place, and time.  Skin: Skin is warm and dry. No rash noted. She is not diaphoretic.  Psychiatric: She has a normal mood and affect. Her behavior is normal.  Nursing note and vitals reviewed.         Assessment & Plan:

## 2015-03-20 ENCOUNTER — Telehealth: Payer: Self-pay | Admitting: *Deleted

## 2015-03-20 NOTE — Addendum Note (Signed)
Addended by: Travares Nelles on: 03/20/2015 10:50 AM   Modules accepted: Orders

## 2015-03-20 NOTE — Telephone Encounter (Signed)
Patient here in office today to drop off urine sample.  States she was told to let Dr. Sherril Cong know if back spasm med (Zanaflex) is not working.  Wants to know if med can be changed to something stronger.  Will route request to Dr. Sherril Cong for advice and call patient back.  Mariah Rice, BSN, RN-BC

## 2015-03-21 ENCOUNTER — Encounter: Payer: Self-pay | Admitting: Obstetrics & Gynecology

## 2015-03-21 ENCOUNTER — Encounter: Payer: Self-pay | Admitting: Family Medicine

## 2015-03-22 NOTE — Telephone Encounter (Signed)
I replied to the patient's email and let her know that she needs to be re-evaluated for her back pain.

## 2015-04-01 LAB — CORTISOL, URINE, FREE

## 2015-04-14 ENCOUNTER — Ambulatory Visit (INDEPENDENT_AMBULATORY_CARE_PROVIDER_SITE_OTHER): Payer: 59 | Admitting: Family Medicine

## 2015-04-14 ENCOUNTER — Encounter: Payer: Self-pay | Admitting: Family Medicine

## 2015-04-14 VITALS — BP 131/87 | HR 79 | Temp 99.0°F | Wt 197.1 lb

## 2015-04-14 DIAGNOSIS — F411 Generalized anxiety disorder: Secondary | ICD-10-CM | POA: Diagnosis not present

## 2015-04-14 DIAGNOSIS — M545 Low back pain, unspecified: Secondary | ICD-10-CM

## 2015-04-14 DIAGNOSIS — N939 Abnormal uterine and vaginal bleeding, unspecified: Secondary | ICD-10-CM

## 2015-04-14 MED ORDER — ALPRAZOLAM 0.5 MG PO TABS: 0.5000 mg | ORAL_TABLET | Freq: Every evening | ORAL | Status: DC | PRN

## 2015-04-14 NOTE — Assessment & Plan Note (Signed)
Much improved on abilify and celexa. Using occasional xanax for sleep which is working well, not every night. - refilled xanax #30, instructed that this must last at least 2 months, patient understands - continue celexa and abilify

## 2015-04-14 NOTE — Assessment & Plan Note (Signed)
Xanaflex not helping. Tolerable with increased exercise. Better mattress also helping some. Offered PT but patient not interested. - patient will let me know if she changes her mind about PT - continue walking, nsaids prn

## 2015-04-14 NOTE — Patient Instructions (Signed)
I am glad to hear you are feeling better. Please keep walking as that will help with your back pain. If you change your mind and want to give physical therapy a try, please let me know.   I look forward to hearing what the gynecologist can offer for your bleeding problem.

## 2015-04-14 NOTE — Assessment & Plan Note (Signed)
Normal labs last visit. Symptoms unchanged. Gyn appt scheduled next month - await gyn recs

## 2015-04-20 NOTE — Progress Notes (Signed)
   Subjective:    Patient ID: Mariah Rice, female    DOB: [birth year], 44 y.o.   MRN: [id number]  HPI Pt presents for f/u of anxiety and back pain. Reports both are doing much better on her current regimen. She is primarily using the xanax for sleep and feels that overall her anxiety and depression are well controlled with the celexa and abilify.  Her back pain is still going on but does better when she walks more, which she has been trying to do. The xanaflex did not help at all so she is not taking that anymore but is still using nsaids regularly.  Her heavy periods are unchanged and she has gyn appointment coming up.   Review of Systems See HPI    Objective:   Physical Exam  Constitutional: She is oriented to person, place, and time. She appears well-developed and well-nourished. No distress.  HENT:  Head: Normocephalic and atraumatic.  Eyes: Conjunctivae are normal. Right eye exhibits no discharge. Left eye exhibits no discharge.  Cardiovascular: Normal rate.   Pulmonary/Chest: Effort normal. No respiratory distress.  Abdominal: She exhibits no distension.  Musculoskeletal: She exhibits no edema.  Neurological: She is alert and oriented to person, place, and time.  Skin: Skin is warm and dry. No rash noted. She is not diaphoretic.  Psychiatric: She has a normal mood and affect. Her behavior is normal.  Nursing note and vitals reviewed.         Assessment & Plan:

## 2015-05-03 ENCOUNTER — Ambulatory Visit (INDEPENDENT_AMBULATORY_CARE_PROVIDER_SITE_OTHER): Payer: 59 | Admitting: Obstetrics & Gynecology

## 2015-05-03 ENCOUNTER — Encounter: Payer: Self-pay | Admitting: Obstetrics & Gynecology

## 2015-05-03 ENCOUNTER — Other Ambulatory Visit (HOSPITAL_COMMUNITY)
Admission: RE | Admit: 2015-05-03 | Discharge: 2015-05-03 | Disposition: A | Discharge status: Home / Self Care | Payer: 59 | Source: Ambulatory Visit | Attending: Obstetrics & Gynecology | Admitting: Obstetrics & Gynecology

## 2015-05-03 VITALS — BP 147/92 | HR 57 | Ht 64.0 in | Wt 202.0 lb

## 2015-05-03 DIAGNOSIS — N939 Abnormal uterine and vaginal bleeding, unspecified: Secondary | ICD-10-CM | POA: Insufficient documentation

## 2015-05-03 DIAGNOSIS — N711 Chronic inflammatory disease of uterus: Secondary | ICD-10-CM

## 2015-05-03 DIAGNOSIS — Z8041 Family history of malignant neoplasm of ovary: Secondary | ICD-10-CM | POA: Diagnosis not present

## 2015-05-03 LAB — POCT PREGNANCY, URINE: Preg Test, Ur: NEGATIVE

## 2015-05-03 MED ORDER — MEGESTROL ACETATE 40 MG PO TABS: 40.0000 mg | ORAL_TABLET | Freq: Two times a day (BID) | ORAL | Status: DC

## 2015-05-03 MED ORDER — NAPROXEN 500 MG PO TABS: 500.0000 mg | ORAL_TABLET | Freq: Two times a day (BID) | ORAL | Status: DC

## 2015-05-03 NOTE — Progress Notes (Signed)
Subjective:    Patient ID: Mariah Rice, female    DOB: 10-08-1971, 44 y.o.   MRN: [id number]  HPI Pt presents for evaluation of menometrorrhagia. Pt reports 1 years of irregular heavy periods, often 2-3 times per month last 7-10 days each. She has severe cramps associated with periods and lots of bloating and gas. She has been on prescription strength nsaids which have helped a little but not controlled her pain. She has also taken progesterone for a short time but stopped taking it because it wasn't helping.   She is concerned about this because her mom died of ovarian cancer and had a hysterectomy at age 3. She is interested in a hysterectomy. She also wonders if she might benefit from removal of her ovaries given her family history.   Review of Systems See HPI    Objective:  Physical Exam  Constitutional: She is oriented to person, place, and time. She appears well-developed and well-nourished. No distress.  HENT:  Head: Normocephalic and atraumatic.  Eyes: Conjunctivae are normal. Right eye exhibits no discharge. Left eye exhibits no discharge.  Cardiovascular: Normal rate.   Pulmonary/Chest: Effort normal. No respiratory distress.  Abdominal: Soft. She exhibits no distension and no mass. There is no tenderness.  Genitourinary: Vagina normal and uterus normal. No vaginal discharge found.  Musculoskeletal: She exhibits no edema.  Neurological: She is alert and oriented to person, place, and time.  Skin: Skin is warm and dry. No rash noted. She is not diaphoretic.  Psychiatric: She has a normal mood and affect. Her behavior is normal.  Nursing note and vitals reviewed. BP 147/92 mmHg  Pulse 57  Ht 5\' 4"  (1.626 m)  Wt 202 lb (91.627 kg)  BMI 34.66 kg/m2  LMP 04/26/2015   ENDOMETRIAL BIOPSY     The indications for endometrial biopsy were reviewed.   Risks of the biopsy including cramping, bleeding, infection, uterine perforation, inadequate specimen and need for additional  procedures  were discussed. The patient states she understands and agrees to undergo procedure today. Consent was signed. Time out was performed. Urine HCG was negative. During the pelvic exam, the cervix was prepped with Betadine. A single-toothed tenaculum was placed on the anterior lip of the cervix to stabilize it. The 3 mm pipelle was introduced into the endometrial cavity without difficulty to a depth of 10 cm, and a moderate amount of tissue was obtained and sent to pathology. The instruments were removed from the patient's vagina. Minimal bleeding from the cervix was noted. The patient tolerated the procedure well. Routine post-procedure instructions were given to the patient.        Assessment & Plan:  Menorrhagia, abnormal uterine bleeding - endometrial biopsy done today - pelvic ultrasound ordered - schedule for vaginal hysterectomy/oophorectomy w/ laparoscopic assist - megace 40mg  bid preop for bleeding - naproxen 500 bid for pain - will call next week with results of endometrial bx and u/s   [name], MD  PGY2   Attestation of Attending Supervision of Resident: Evaluation and management procedures were performed by the [facility] Resident under my supervision.  I have seen and examined the patient, reviewed the resident's note and chart, and I agree with the management and plan.  Patient desires definitive management with hysterectomy.  I proposed doing a laparoscopic-assisted vaginal hysterectomy (LAVH) and prophylactic bilateral salpingectomy.  Patient's mother died at age 33 from ovarian cancer, no genetic testing, but patient strongly desires bilateral oophorectomy.  She was counseled that  oophorectomy and associated surgical menopause could have major health risks such as loss of ovarian hormones that could lead to vasomotor symptoms and osteoporosis, increased risk of cardiovascular disease, dementia, procedure-related complications and increased overall mortality.  She may need to take hormone replacement therapy with its associated risks.  Recommended genetic testing and further contemplation about this matter.  Patient agrees with LAVH, BS, possible BSO.  The risks of surgery were discussed in detail with the patient including but not limited to: bleeding which may require transfusion or reoperation; infection which may require antibiotics; injury to bowel, bladder, ureters or other surrounding organs; need for additional procedures including laparotomy; thromboembolic phenomenon, incisional problems and other postoperative/anesthesia complications.  Patient was also advised that she will remain in house for 1 night; and expected recovery time after a hysterectomy is 6-8 weeks.  Likelihood of success in alleviating the patient's symptoms was discussed.   She was told that she will be contacted by our surgical scheduler regarding the time and date of her surgery; routine preoperative instructions of having nothing to eat or drink after midnight on the day prior to surgery and also coming to the hospital 1 1/2 hours prior to her time of surgery were also emphasized.  She was told she may be called for a preoperative appointment about a week prior to surgery and will be given further preoperative instructions at that visit.  Routine postoperative instructions will be reviewed with the patient and her family in detail after surgery.  In the meantime, she will continue Megace which was prescribed; bleeding precautions were reviewed. Printed patient education handouts about the procedure was given to the patient to review at home.  Patient will be contacted with results of endometrial biopsy and ultrasound.   [name], MD, [place] Attending [place], [facility]

## 2015-05-03 NOTE — Patient Instructions (Addendum)
Breast center - to schedule a mammogram  Address: [address], [place], [place] [address]  [phone]    Laparoscopically Assisted Vaginal Hysterectomy  A laparoscopically assisted vaginal hysterectomy (LAVH) is a surgical procedure to remove the uterus and cervix, and sometimes the ovaries and fallopian tubes. During an LAVH, some of the surgical removal is done through the vagina, and the rest is done through a few small surgical cuts (incisions) in the abdomen.  This procedure is usually considered in women when a vaginal hysterectomy is not an option. Your health care provider will discuss the risks and benefits of the different surgical techniques at your appointment. Generally, recovery time is faster and there are fewer complications after laparoscopic procedures than after open incisional procedures. LET [facility] CARE PROVIDER KNOW ABOUT:   Any allergies you have.  All medicines you are taking, including vitamins, herbs, eye drops, creams, and over-the-counter medicines.  Previous problems you or members of your family have had with the use of anesthetics.  Any blood disorders you have.  Previous surgeries you have had.  Medical conditions you have. RISKS AND COMPLICATIONS Generally, this is a safe procedure. However, as with any procedure, complications can occur. Possible complications include:  Allergies to medicines.  Difficulty breathing.  Bleeding.  Infection.  Damage to other structures near your uterus and cervix. BEFORE THE PROCEDURE  Ask your health care provider about changing or stopping your regular medicines.  Take certain medicines, such as a colon-emptying preparation, as directed.  Do not eat or drink anything for at least 8 hours before your surgery.  Stop smoking if you smoke. Stopping will improve your health after surgery.  Arrange for a ride home after surgery and for help at home during recovery. PROCEDURE   An IV tube  will be put into one of your veins in order to give you fluids and medicines.  You will receive medicines to relax you and medicines that make you sleep (general anesthetic).  You may have a flexible tube (catheter) put into your bladder to drain urine.  You may have a tube put through your nose or mouth that goes into your stomach (nasogastric tube). The nasogastric tube removes digestive fluids and prevents you from feeling nauseated and from vomiting.  Tight-fitting (compression) stockings will be placed on your legs to promote circulation.  Three to four small incisions will be made in your abdomen. An incision also will be made in your vagina. Probes and tools will be inserted into the small incisions. The uterus and cervix are removed (and possibly your ovaries and fallopian tubes) through your vagina as well as through the small incisions that were made in the abdomen.  Your vagina is then sewn back to normal. AFTER THE PROCEDURE  You may have a liquid diet temporarily. You will most likely return to, and tolerate, your usual diet the day after surgery.  You will be passing urine through a catheter. It will be removed the day after surgery.  Your temperature, breathing rate, heart rate, blood pressure, and oxygen level will be monitored regularly.  You will still wear compression stockings on your legs until you are able to move around.  You will use a special device or do breathing exercises to keep your lungs clear.  You will be encouraged to walk as soon as possible. Document Released: 11/21/2011 Document Revised: 08/04/2013 Document Reviewed: 06/17/2013 [facility] Patient Information 2015 [place], [place]. This information is not intended to replace advice given  to you by your health care provider. Make sure you discuss any questions you have with your health care provider.

## 2015-05-04 DIAGNOSIS — Z8041 Family history of malignant neoplasm of ovary: Secondary | ICD-10-CM | POA: Insufficient documentation

## 2015-05-07 DIAGNOSIS — N711 Chronic inflammatory disease of uterus: Secondary | ICD-10-CM | POA: Insufficient documentation

## 2015-05-07 MED ORDER — DOXYCYCLINE HYCLATE 100 MG PO CAPS: 100.0000 mg | ORAL_CAPSULE | Freq: Two times a day (BID) | ORAL | Status: DC

## 2015-05-07 NOTE — Addendum Note (Signed)
Addended by: [name] A on: 05/07/2015 01:46 PM   Modules accepted: Orders

## 2015-05-08 ENCOUNTER — Telehealth: Payer: Self-pay

## 2015-05-08 NOTE — Telephone Encounter (Signed)
Called pt and informed her of endometrial biopsy results and need for antibiotics to treat inflammation of the endometrium. Dosage instructions explained.  Per Dr. Harolyn Rutherford, she will proceed with hysterectomy as planned.  Pt voiced understanding of all information and instructions given.

## 2015-05-08 NOTE — Telephone Encounter (Signed)
Attempted to contact patient. No answer. Left message stating we are calling with results, please call clinic.

## 2015-05-08 NOTE — Telephone Encounter (Signed)
-----   Message from Osborne Oman, MD sent at 05/07/2015  1:46 PM EDT ----- Endometrium, biopsy - CHRONIC ENDOMETRITIS. - BENIGN SECRETORY TYPE ENDOMETRIUM.  Doxycycline regimen for 14 days  (100 mg po bid) ordered for patient.  Will proceed with hysterectomy as planned for now.  Benign endometrial biopsy. Please call to inform patient of results and recommendations; also advise her to pick up prescription.

## 2015-05-11 ENCOUNTER — Ambulatory Visit (HOSPITAL_COMMUNITY)
Admission: RE | Admit: 2015-05-11 | Discharge: 2015-05-11 | Disposition: A | Discharge status: Home / Self Care | Payer: 59 | Source: Ambulatory Visit | Attending: Obstetrics & Gynecology | Admitting: Obstetrics & Gynecology

## 2015-05-11 DIAGNOSIS — N921 Excessive and frequent menstruation with irregular cycle: Secondary | ICD-10-CM | POA: Insufficient documentation

## 2015-05-11 DIAGNOSIS — N939 Abnormal uterine and vaginal bleeding, unspecified: Secondary | ICD-10-CM

## 2015-05-31 ENCOUNTER — Encounter: Payer: Self-pay | Admitting: Family Medicine

## 2015-05-31 ENCOUNTER — Ambulatory Visit: Payer: 59 | Admitting: Family Medicine

## 2015-05-31 ENCOUNTER — Ambulatory Visit (INDEPENDENT_AMBULATORY_CARE_PROVIDER_SITE_OTHER): Payer: 59 | Admitting: Family Medicine

## 2015-05-31 VITALS — BP 126/82 | HR 70 | Temp 98.2°F | Wt 196.1 lb

## 2015-05-31 DIAGNOSIS — F411 Generalized anxiety disorder: Secondary | ICD-10-CM | POA: Diagnosis not present

## 2015-05-31 MED ORDER — ALPRAZOLAM 0.5 MG PO TABS: 0.5000 mg | ORAL_TABLET | Freq: Every evening | ORAL | Status: DC | PRN

## 2015-05-31 MED ORDER — IBUPROFEN 800 MG PO TABS: 800.0000 mg | ORAL_TABLET | Freq: Three times a day (TID) | ORAL | Status: DC | PRN

## 2015-05-31 NOTE — Patient Instructions (Signed)
Thank you for coming in, today!  Everything looks okay today. I will refill your Xanax today and give you 30 tablets with 1 refill. This should last you up to 4 months, at least until August. If you run out before August Dr. Sherril Cong may or may not refill it before.  Come back to see her as you need. I would recommend making an appointment today for late July / early August if she has any openings. If she doesn't, call back sometime in July.  Please feel free to call with any questions or concerns at any time, at [phone]. --Dr. Venetia Maxon

## 2015-05-31 NOTE — Assessment & Plan Note (Signed)
A: Anxiety / depression symptoms stable with Celexa, Abilify, and Xanax. Xanax reportedly helpful for "spikes" of anxiety, which has been worse with stress at work, lately. Last Rx was for 30 tablets which lasted about 2 months. Pt questions whether use of different medication or stronger dose of Xanax would be helpful but does not want a longer-acting medication or anything more sedating, as she feels her current dose works "fine." No SI / HI / AH / VH. She does request a "54-month refill" since she is uncertain when in July she would be able to follow up due to upcoming hysterectomy surgery.  P: Continue Abilify and Celexa, no refills needed. Xanax refilled, #30 with 1 refill, with specific instructions and counseling given; explained to pt that this should last up to 4 months (August at the absolute earliest) and that Dr. Sherril Cong may not refill it before August or later. Recommended f/u with Dr. Sherril Cong in late July or early August as she is able. Otherwise f/u as needed.

## 2015-05-31 NOTE — Progress Notes (Signed)
   Subjective:    Patient ID: Mariah Rice, female    DOB: 11-Mar-1971, 44 y.o.   MRN: [id number]  HPI: Pt presents to clinic for f/u of anxiety and refill of Xanax. She is currently taking Xanax about twice per day (0.5 mg), which works well for her. She also takes Celexa and Abilify which help with depression; she does not need refills of these. She reports a "chattering" sensation / tingling in her lips and mouth when her anxiety as well as "shakes" and a "bad nervous feeling" with shortness of breathing. 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  Review of Systems: As above.     Objective:   Physical Exam BP 126/82 mmHg  Pulse 70  Temp(Src) 98.2 F (36.8 C) (Oral)  Wt 196 lb 1.6 oz (88.95 kg)  LMP 04/26/2015 Gen: well-appearing adult female in NAD HEENT: [place]/AT, EOMI, PERRLA Cardio: RRR, no murmur appreciated Pulm: CTAB, no wheezes, normal WOB Abd: soft, nontender, BS+ Ext: warm, well-perfused, no LE edema  Mental Status Examination/Evaluation: Objective:  Appearance: Neat and Well Groomed  Psychomotor Activity:  Normal  Eye Contact::  Good  Speech:  Clear and Coherent  Volume:  Normal  Mood:  Reported slightly anxious  Affect:  Appropriate and Full Range  Thought Process:  Coherent and Logical  Orientation:  Full (Time, Place, and Person)  Thought Content:  normal  Suicidal Thoughts:  No  Homicidal Thoughts:  No  Judgement:  Fair  Insight:  Fair      Assessment & Plan:  See problem list  note.

## 2015-06-12 ENCOUNTER — Encounter: Payer: Self-pay | Admitting: Family Medicine

## 2015-06-12 ENCOUNTER — Ambulatory Visit (INDEPENDENT_AMBULATORY_CARE_PROVIDER_SITE_OTHER): Payer: 59 | Admitting: Family Medicine

## 2015-06-12 VITALS — Temp 98.2°F | Wt 198.0 lb

## 2015-06-12 DIAGNOSIS — R42 Dizziness and giddiness: Secondary | ICD-10-CM | POA: Diagnosis not present

## 2015-06-12 MED ORDER — MECLIZINE HCL 25 MG PO TABS: 12.5000 mg | ORAL_TABLET | Freq: Three times a day (TID) | ORAL | Status: DC | PRN

## 2015-06-12 NOTE — Progress Notes (Signed)
   Subjective:    Patient ID: Mariah Rice, female    DOB: Mar 29, 1971, 44 y.o.   MRN: [id number]  HPI: Pt presents to clinic for SDA for two episodes of near-syncope in the past few days. The first episode was 4 days ago (Thursday 6/23). She reports she was standing at work, cooking, and "suddenly felt sick [nauseated]" and dizzy with sensation of the room spinning. She reports the sensation lasted an hour or so, and resolved on its own. She states yesterday she felt similar, suddenly, and thinks she lost consciousness for 1-2 minutes (on first description pt states she "almost passed out," rather than endorsing full LOC); a coworker "caught" her and helped her into a chair and she 'came to' afterwards. She had no involuntary movements, no loss of bowl / bladder control, and no lingering confusion / disorientation. She had no frank chest pain, palpitations, or shortness of breath. Overall, she still feels slightly dizzy / weak. After both of these episodes, she "went home and slept."  Of note, pt does take Celexa, Abilify, and Xanax for anxiety. She has never had issues in the past with dizziness or fainting. She has had no recent injuries or changes in medications.  Review of Systems: As above. Denies sinusitis / coryza-type symptoms.     Objective:   Physical Exam Temp(Src) 98.2 F (36.8 C) (Oral)  Wt 198 lb (89.812 kg) Orthostatic vital signs:  Lying: 148 / 83, pulse 67  Sitting: 133 / 97, pulse 74  Standing: 129 / 89, pulse 83 Note pt reports some dizziness with change in position for vitals  Gen: well-appearing adult female, tearful during interview due to worry but otherwise consolable / appropriate HEENT: [place]/AT, EOMI, PERRLA, MMM, TM's clear bilaterally  Nasal mucosae and posterior oropharynx clear Neck: supple, normal ROM, no lymphadenopathy Cardio: RRR, no murmur appreciated Pulm: CTAB, no wheezes, normal WOB Abd: soft, nontender, BS+ Ext: warm, well-perfused, no LE  edema Neuro: alert, oriented x4, CN II - XII grossly intact, sensation and strength intact in bilateral extremities  No definite nystagmus noted  Gait / balance intact  Symptoms reproducible with pt forced quickly from sitting to lying position, worse with head-turning during these movements     Assessment & Plan:  43yo with likely vertigo - classic room-spinning symptoms without definite syncope, worse with head movements - exam reassuring and symptoms reproduced with Dix-Hallpike-type maneuver and with changes in position - doubt frank orthostasis (BP changes as above but within normal ranges), cardiac causes (no other cardiac symptoms), or seizures (no convulsions or post-ictal state) - hx of significant anxiety / depression-type symptoms on Celexa, Abilify, and Xanax, so medications may be playing some role  Plan: - provided instructions and handout on home Epley maneuver and provided Rx for Antivert, 12.5 - 25 mg up to TID PRN - defer labs, EKG, etc, for now -- pt preference (offered further work-up) - could consider referral to vestibular rehab, but pt desires to try the above measures first - f/u in 3-4 weeks with Dr. Sherril Cong, or sooner if needed - reviewed red flags that would prompt immediate eval (full syncope, chest pain or irregular heartbeat, fever, inability to tolerate PO, weakness / paresthesias, or falls / worse balance problems)  Note FYI to Dr. Delena Bali, MD PGY-3, [place] [facility] 06/12/2015, 2:02 PM

## 2015-06-12 NOTE — Patient Instructions (Signed)
Thank you for coming in, today!  I think you are having vertigo -- this can be caused by several things, including inner ear issues. I will give you a medicine called meclizine that can help -- you can take 0.5 to 1 tablet, up to 3 times per day as needed. It may cause some drowsiness especially the first few times you take it.  I will show you how to do something called the "Epley" maneuver. It may make your dizziness worse right after you do it, so start at bedtime. This can help your inner ear rebalance itself.  Call or come back to see Dr. Sherril Cong in 2-3 weeks if you're no better. She may want to refer you to the "vestibular rehab" physical therapists that can help with balance issues. Otherwise call or come back here, or go to the emergency room as needed.  Please feel free to call with any questions or concerns at any time, at [phone]. --Dr. Venetia Maxon

## 2015-06-26 ENCOUNTER — Encounter: Payer: Self-pay | Admitting: Family Medicine

## 2015-06-26 ENCOUNTER — Ambulatory Visit (INDEPENDENT_AMBULATORY_CARE_PROVIDER_SITE_OTHER): Payer: 59 | Admitting: Family Medicine

## 2015-06-26 VITALS — BP 142/86 | HR 69 | Temp 98.5°F | Ht 64.0 in | Wt 195.9 lb

## 2015-06-26 DIAGNOSIS — F411 Generalized anxiety disorder: Secondary | ICD-10-CM

## 2015-06-26 MED ORDER — CLONAZEPAM 0.5 MG PO TABS: 0.5000 mg | ORAL_TABLET | Freq: Two times a day (BID) | ORAL | Status: DC | PRN

## 2015-06-26 NOTE — Patient Instructions (Signed)
Clonazepam tablets What is this medicine? CLONAZEPAM (kloe NA ze pam) is a benzodiazepine. It is used to treat certain types of seizures. It is also used to treat panic disorder. This medicine may be used for other purposes; ask your health care provider or pharmacist if you have questions. COMMON BRAND NAME(S): Ceberclon, Klonopin What should I tell my health care provider before I take this medicine? They need to know if you have any of these conditions: -an alcohol or drug abuse problem -bipolar disorder, depression, psychosis or other mental health condition -glaucoma -kidney or liver disease -lung or breathing disease -myasthenia gravis -Parkinson's disease -seizures or a history of seizures -suicidal thoughts -an unusual or allergic reaction to clonazepam, other benzodiazepines, foods, dyes, or preservatives -pregnant or trying to get pregnant -breast-feeding How should I use this medicine? Take this medicine by mouth with a glass of water. Follow the directions on the prescription label. If it upsets your stomach, take it with food or milk. Take your medicine at regular intervals. Do not take it more often than directed. Do not stop taking or change the dose except on the advice of your doctor or health care professional. A special MedGuide will be given to you by the pharmacist with each prescription and refill. Be sure to read this information carefully each time. Talk to your pediatrician regarding the use of this medicine in children. Special care may be needed. Overdosage: If you think you have taken too much of this medicine contact a poison control center or emergency room at once. NOTE: This medicine is only for you. Do not share this medicine with others. What if I miss a dose? If you miss a dose, take it as soon as you can. If it is almost time for your next dose, take only that dose. Do not take double or extra doses. What may interact with this medicine? -herbal or  dietary supplements -medicines for depression, anxiety, or psychotic disturbances -medicines for fungal infections like fluconazole, itraconazole, ketoconazole, voriconazole -medicines for HIV infection or AIDS -medicines for sleep -prescription pain medicines -propantheline -rifampin -sevelamer -some medicines for seizures like carbamazepine, phenobarbital, phenytoin, primidone This list may not describe all possible interactions. Give your health care provider a list of all the medicines, herbs, non-prescription drugs, or dietary supplements you use. Also tell them if you smoke, drink alcohol, or use illegal drugs. Some items may interact with your medicine. What should I watch for while using this medicine? Visit your doctor or health care professional for regular checks on your progress. Your body may become dependent on this medicine. If you have been taking this medicine regularly for some time, do not suddenly stop taking it. You must gradually reduce the dose or you may get severe side effects. Ask your doctor or health care professional for advice before increasing or decreasing the dose. Even after you stop taking this medicine it can still affect your body for several days. If you suffer from several types of seizures, this medicine may increase the chance of grand mal seizures (epilepsy). Let your doctor or health care professional know, he or she may want to prescribe an additional medicine. You may get drowsy or dizzy. Do not drive, use machinery, or do anything that needs mental alertness until you know how this medicine affects you. To reduce the risk of dizzy and fainting spells, do not stand or sit up quickly, especially if you are an older patient. Alcohol may increase dizziness and drowsiness. Avoid alcoholic   drinks. Do not treat yourself for coughs, colds or allergies without asking your doctor or health care professional for advice. Some ingredients can increase possible side  effects. The use of this medicine may increase the chance of suicidal thoughts or actions. Pay special attention to how you are responding while on this medicine. Any worsening of mood, or thoughts of suicide or dying should be reported to your health care professional right away. Women who become pregnant while using this medicine may enroll in the [place] Antiepileptic Drug Pregnancy Registry by calling 1-888-233-2334. This registry collects information about the safety of antiepileptic drug use during pregnancy. What side effects may I notice from receiving this medicine? Side effects that you should report to your doctor or health care professional as soon as possible: -allergic reactions like skin rash, itching or hives, swelling of the face, lips, or tongue -changes in vision -confusion -depression -hallucinations -mood changes, excitability or aggressive behavior -movement difficulty, staggering or jerky movements -muscle cramps, weakness -tremors -unusual eye movements Side effects that usually do not require medical attention (report to your doctor or health care professional if they continue or are bothersome): -constipation or diarrhea -difficulty sleeping, nightmares -dizziness, drowsiness -headache -increased saliva from your mouth -nausea, vomiting This list may not describe all possible side effects. Call your doctor for medical advice about side effects. You may report side effects to FDA at 1-800-FDA-1088. Where should I keep my medicine? Keep out of the reach of children. This medicine can be abused. Keep your medicine in a safe place to protect it from theft. Do not share this medicine with anyone. Selling or giving away this medicine is dangerous and against the law. Store at room temperature between 15 and 30 degrees C (59 and 86 degrees F). Protect from light. Keep container tightly closed. Throw away any unused medicine after the expiration date. NOTE: This  sheet is a summary. It may not cover all possible information. If you have questions about this medicine, talk to your doctor, pharmacist, or health care provider.  2015, Elsevier/Gold Standard. (2009-10-27 19:16:36)  

## 2015-06-29 ENCOUNTER — Encounter (HOSPITAL_COMMUNITY)
Admission: RE | Admit: 2015-06-29 | Discharge: 2015-06-29 | Disposition: A | Discharge status: Home / Self Care | Payer: 59 | Source: Ambulatory Visit | Attending: Obstetrics & Gynecology | Admitting: Obstetrics & Gynecology

## 2015-06-29 ENCOUNTER — Other Ambulatory Visit: Payer: Self-pay

## 2015-06-29 ENCOUNTER — Encounter (HOSPITAL_COMMUNITY): Payer: Self-pay

## 2015-06-29 DIAGNOSIS — N939 Abnormal uterine and vaginal bleeding, unspecified: Secondary | ICD-10-CM | POA: Diagnosis not present

## 2015-06-29 DIAGNOSIS — Z0183 Encounter for blood typing: Secondary | ICD-10-CM | POA: Diagnosis not present

## 2015-06-29 DIAGNOSIS — R9431 Abnormal electrocardiogram [ECG] [EKG]: Secondary | ICD-10-CM | POA: Insufficient documentation

## 2015-06-29 DIAGNOSIS — Z01812 Encounter for preprocedural laboratory examination: Secondary | ICD-10-CM | POA: Diagnosis present

## 2015-06-29 DIAGNOSIS — Z8041 Family history of malignant neoplasm of ovary: Secondary | ICD-10-CM | POA: Insufficient documentation

## 2015-06-29 HISTORY — DX: Gastro-esophageal reflux disease without esophagitis: K21.9

## 2015-06-29 HISTORY — DX: Headache: R51

## 2015-06-29 HISTORY — DX: Thyrotoxicosis, unspecified without thyrotoxic crisis or storm: E05.90

## 2015-06-29 HISTORY — DX: Reserved for inherently not codable concepts without codable children: IMO0001

## 2015-06-29 HISTORY — DX: Headache, unspecified: R51.9

## 2015-06-29 HISTORY — DX: Other specified postprocedural states: R11.2

## 2015-06-29 HISTORY — DX: Other specified postprocedural states: Z98.890

## 2015-06-29 LAB — BASIC METABOLIC PANEL
Anion gap: 6 (ref 5–15)
BUN: 10 mg/dL (ref 6–20)
CALCIUM: 9.1 mg/dL (ref 8.9–10.3)
CO2: 26 mmol/L (ref 22–32)
CREATININE: 1.01 mg/dL — AB (ref 0.44–1.00)
Chloride: 106 mmol/L (ref 101–111)
GFR calc Af Amer: >60 mL/min (ref >60)
Glucose, Bld: 89 mg/dL (ref 65–99)
POTASSIUM: 3.6 mmol/L (ref 3.5–5.1)
SODIUM: 138 mmol/L (ref 135–145)

## 2015-06-29 LAB — TYPE AND SCREEN
ABO/RH(D): O POS
ANTIBODY SCREEN: NEGATIVE

## 2015-06-29 LAB — CBC
HCT: 43 % (ref 36.0–46.0)
HEMOGLOBIN: 14.9 g/dL (ref 12.0–15.0)
MCH: 32.4 pg (ref 26.0–34.0)
MCHC: 34.7 g/dL (ref 30.0–36.0)
MCV: 93.5 fL (ref 78.0–100.0)
Platelets: 285 10*3/uL (ref 150–400)
RBC: 4.6 MIL/uL (ref 3.87–5.11)
RDW: 14.6 % (ref 11.5–15.5)
WBC: 7.9 10*3/uL (ref 4.0–10.5)

## 2015-06-29 LAB — ABO/RH: ABO/RH(D): O POS

## 2015-06-29 NOTE — Patient Instructions (Signed)
Your procedure is scheduled on:07/11/15  Enter through the Main Entrance at :7am Pick up desk phone and dial [phone] and inform us of your arrival.  Please call [phone] if you have any problems the morning of surgery.  Remember: Do not eat food or drink liquids after midnight:Monday 07/10/15 Water ok until ok for taking meds am of surgery.   You may brush your teeth the morning of surgery.  Take these meds the morning of surgery with a sip of water:Nexium, Abilify, Clonazepam, Celexa, Blood pressure pill  DO NOT wear jewelry, eye make-up, lipstick,body lotion, or dark fingernail polish.  (Polished toes are ok) You may wear deodorant.  If you are to be admitted after surgery, leave suitcase in car until your room has been assigned. Patients discharged on the day of surgery will not be allowed to drive home. Wear loose fitting, comfortable clothes for your ride home.

## 2015-06-29 NOTE — Pre-Procedure Instructions (Signed)
Abnormal EKG approved by Dr. Jillyn Hidden

## 2015-07-02 NOTE — Assessment & Plan Note (Signed)
Worsening anxiety related to work and family stressors, also with hysterectomy planned for later this month. patient on celexa and abilify with occasional xanax  - continue celexa and abilify - stop xanax - started klonopin bid prn, hopefully this will help create steady state instead of on and off with shorter acting benzo - discussed counseling, patient willing to try this again but after her upcoming surgery

## 2015-07-02 NOTE — Progress Notes (Signed)
   Subjective:   Mariah Rice is a 44 y.o. female with a history of anxiety, menorrhagia here for f/u of anxiety  Pt reports worsening anxiety over the past month with crying spells and difficulty concentrating and getting work done. She has been taking her celexa and abilify which had previously controlled her mood symptoms well. She had xanax for rare use but knows she is not supposed to take this every day and it's only helping for a short time anyway. She has a surgery coming up later this month and has been working a lot of hours to try and save for her recovery. The worry about the surgery and increased work load are both making her anxiety worse. She is also very irritable and snappy with her family the past few weeks  Review of Systems:  Per HPI. All other systems reviewed and are negative.   PMH, PSH, Medications, Allergies, and FmHx reviewed and updated in EMR.  Social History: current smoker  Objective:  BP 142/86 mmHg  Pulse 69  Temp(Src) 98.5 F (36.9 C) (Oral)  Ht 5\' 4"  (1.626 m)  Wt 195 lb 14.4 oz (88.86 kg)  BMI 33.61 kg/m2  Gen:  44 y.o. female in NAD HEENT: NCAT, MMM, EOMI, PERRL, anicteric sclerae CV: RRR, no MRG, no JVD Resp: Non-labored, CTAB, no wheezes noted Abd: Soft, NTND, BS present, no guarding or organomegaly Ext: WWP, no edema MSK: Full ROM, strength intact Neuro: Alert and oriented, speech normal      Chemistry      Component Value Date/Time   NA 138 06/29/2015 1025   K 3.6 06/29/2015 1025   CL 106 06/29/2015 1025   CO2 26 06/29/2015 1025   BUN 10 06/29/2015 1025   CREATININE 1.01* 06/29/2015 1025   CREATININE 0.76 08/16/2014 1613      Component Value Date/Time   CALCIUM 9.1 06/29/2015 1025   ALKPHOS 59 08/16/2014 1613   AST 14 08/16/2014 1613   ALT 14 08/16/2014 1613   BILITOT 0.4 08/16/2014 1613      Lab Results  Component Value Date   WBC 7.9 06/29/2015   HGB 14.9 06/29/2015   HCT 43.0 06/29/2015   MCV 93.5 06/29/2015   PLT  285 06/29/2015   Lab Results  Component Value Date   TSH 2.520 03/15/2015   Lab Results  Component Value Date   HGBA1C 5.6 03/15/2015   Assessment:     Mariah Rice is a 44 y.o. female here for anxiety    Plan:     See problem list for problem-specific plans.   [name], MD MPH PGY-3,  [facility] 07/02/2015  1:31 PM

## 2015-07-03 ENCOUNTER — Telehealth: Payer: Self-pay | Admitting: Family Medicine

## 2015-07-03 ENCOUNTER — Telehealth: Payer: Self-pay | Admitting: *Deleted

## 2015-07-03 ENCOUNTER — Encounter: Payer: Self-pay | Admitting: Family Medicine

## 2015-07-03 NOTE — Telephone Encounter (Signed)
Pt called because she is getting ready to have surgery. She is starting to get dizzy again and pass out. She was hoping that the doctor can go ahead and take her out of work. jw

## 2015-07-03 NOTE — Telephone Encounter (Signed)
see my chart message.

## 2015-07-03 NOTE — Telephone Encounter (Addendum)
Patient left message stating that she is having surgery soon and would like to speak to someone about symptoms she is having.   7/20  1455  Called pt and left message on her personal voice mail stating that I am calling regarding her message. Please call back and state the nature of her concerns or questions and whether we may leave detailed medical information/advice with our return call.  Diane Day RNC

## 2015-07-04 ENCOUNTER — Ambulatory Visit (INDEPENDENT_AMBULATORY_CARE_PROVIDER_SITE_OTHER): Payer: 59 | Admitting: Family Medicine

## 2015-07-04 ENCOUNTER — Encounter: Payer: Self-pay | Admitting: Family Medicine

## 2015-07-04 VITALS — BP 132/86 | HR 79 | Temp 98.7°F | Ht 64.0 in | Wt 194.5 lb

## 2015-07-04 DIAGNOSIS — R42 Dizziness and giddiness: Secondary | ICD-10-CM | POA: Diagnosis not present

## 2015-07-04 NOTE — Progress Notes (Signed)
Subjective:     Patient ID: Mariah Rice, female   DOB: 1970-12-30, 44 y.o.   MRN: [id number]  HPI  Mrs. Mariah Rice is a 44 y.o female with a PMHx of HTN, Depression and tobacco abuse. She presents today with a complaint of dizziness which she has been experiencing over the past 2-3 weeks. She states that the dizziness is random and she will have spells with rising from a seated position, looking up or even when she is just standing there. She describes her dizziness spells as the room spinning around her and that she her self also feels dizzy at the same time. She also says that sometimes it feels as though she is going to fall backward. She states that she gets nausea and sometimes even dry heaves during the episodes. She has had to leave work early over the past week because of her symptoms. She states that sometimes during the dizzy spells her extremities get numb bilaterally. She also, sometimes while working feels weakness in her hands and may even drop a pot. She has had ringing in her ears for a while now even before the dizziness set in. About 1 weeks ago she had an episode where she passed out, she states that she did not lose consciousness. She was prescribed meclizine which helped a little. She has had increasing headaches for the past week but denies any blurred vision, SOB, chest pain, dysphagia, major changes in bowel movements, dysuria or hematuria. She expressed concern and worry about her symptoms.  Review of Systems Pertinent ROS as per HPI    Objective:   Physical Exam  General: NAD HEENT: PEERL, moist mucous membranes, ear  CV: nl S1 S2 no m/r/g Pulm: CTAB, no w/c/r GI: NABS, S/NT/ND Neuro: AQx3, dizziness upon rising, strength normal throughout, no vertical nystagmus, slight horizontal nystagmus when looking to the left. Gait is slow and cautious. Negative Dix -Hallpike, negative Rhomberg sign.  CN intact     Assessment:     Mrs. Mariah Rice is a 44 y.o female with a PMHx of HTN,  depression, and tobacco abuse. She presents with episodes of vertigo and dizziness aggravated by standing but they also occur randomly accompanied by nausea and intermittent dry heaving. Tinnitus was present previous to these episodes but has been increasing recently. She also has been experiencing increased headaches. Also present is slight horizontal nystagmus and a slow cautious gate.      Plan:     Dizziness/vertigo: could be central or peripheral, no clear localizing features. The Tinnitus, increasing headaches and a slow, cautious gait are concerning signs. Negative Dix-Hallpike maneuver is also concerning for a central lesion. DDx includes, labrynthitis, BPPV, central mass, CN 8 schwannoma, meinere's disease. Most probably labrynthitis, considering ringing in ears acute onset and nausea. Could also be meineres disease, but patient has had tinnitus before the dizziness episodes. Less likely but must not miss central mass causing symptoms.   - MRI to rule out central mass causing these symptoms.   -Increase your Meclizine to 50mg  twice per day (2 pills twice per day). - Informed patient that if they have any worsening symptoms, passing out, chest pain, shortness of breath, weakness or stroke symptoms, to return to clinic/go to the emergency department  Follow-up in one week for follow-up assuming you have gotten your MRI.

## 2015-07-04 NOTE — Patient Instructions (Signed)
Thank you for coming to see me today. It was a pleasure. Today we talked about:   Dizziness: I will check an MRI of your brain. Please increase your Meclizine to 50mg  twice per day (2 pills twice per day). If you have any worsening symptoms, passing out, chest pain, shortness of breath, weakness or stroke symptoms, please return to clinic/go to the emergency department  Please follow-up in one week for follow-up assuming you have gotten your MRI.  If you have any questions or concerns, please do not hesitate to call the office at [phone].  Sincerely,  Cordelia Poche, MD

## 2015-07-05 ENCOUNTER — Telehealth: Payer: Self-pay | Admitting: *Deleted

## 2015-07-05 ENCOUNTER — Encounter: Payer: Self-pay | Admitting: *Deleted

## 2015-07-05 NOTE — Progress Notes (Signed)
FMLA completed, will contact patient.  Copy in front office for patient.

## 2015-07-05 NOTE — Telephone Encounter (Signed)
Mychart message to the patient.

## 2015-07-05 NOTE — Telephone Encounter (Signed)
Her authorization number for this is [id number]. Mariah Rice, Eisa D, [place]

## 2015-07-05 NOTE — Telephone Encounter (Signed)
Contacted pt to inform her that her MRI is scheduled at [facility] on 07/17/2015 at 10:00am with a 9:45am arrival. Katharina Caper, [place], CMA

## 2015-07-06 NOTE — Telephone Encounter (Signed)
Pt seen on 7/19 for her concerns.

## 2015-07-10 MED ORDER — GENTAMICIN SULFATE 40 MG/ML IJ SOLN
INTRAVENOUS | Status: AC
  Administered 2015-07-11: 114 mL via INTRAVENOUS
  Filled 2015-07-10: qty 8.5

## 2015-07-10 NOTE — Anesthesia Preprocedure Evaluation (Signed)
Anesthesia Evaluation  Patient identified by MRN, date of birth, ID band Patient awake    Reviewed: Allergy & Precautions, H&P , Patient's Chart, lab work & pertinent test results, reviewed documented beta blocker date and time   Airway Mallampati: II  TM Distance: >3 FB Neck ROM: full    Dental no notable dental hx.    Pulmonary Current Smoker,  breath sounds clear to auscultation  Pulmonary exam normal       Cardiovascular hypertension, On Medications Rhythm:regular Rate:Normal     Neuro/Psych    GI/Hepatic GERD-  ,  Endo/Other    Renal/GU      Musculoskeletal   Abdominal   Peds  Hematology   Anesthesia Other Findings   Reproductive/Obstetrics                             Anesthesia Physical Anesthesia Plan  ASA: II  Anesthesia Plan: General   Post-op Pain Management:    Induction: Intravenous  Airway Management Planned: Oral ETT  Additional Equipment:   Intra-op Plan:   Post-operative Plan: Extubation in OR  Informed Consent: I have reviewed the patients History and Physical, chart, labs and discussed the procedure including the risks, benefits and alternatives for the proposed anesthesia with the patient or authorized representative who has indicated his/her understanding and acceptance.   Dental Advisory Given and Dental advisory given  Plan Discussed with: CRNA and Surgeon  Anesthesia Plan Comments: (  Discussed general anesthesia, including possible nausea, instrumentation of airway, sore throat,pulmonary aspiration, etc. I asked if the were any outstanding questions, or  concerns before we proceeded. )        Anesthesia Quick Evaluation

## 2015-07-11 ENCOUNTER — Ambulatory Visit (HOSPITAL_COMMUNITY): Payer: 59 | Admitting: Anesthesiology

## 2015-07-11 ENCOUNTER — Observation Stay (HOSPITAL_COMMUNITY)
Admission: RE | Admit: 2015-07-11 | Discharge: 2015-07-12 | Disposition: A | Discharge status: Home / Self Care | Payer: 59 | Source: Ambulatory Visit | Attending: Obstetrics & Gynecology | Admitting: Obstetrics & Gynecology

## 2015-07-11 ENCOUNTER — Encounter (HOSPITAL_COMMUNITY)
Admission: RE | Disposition: A | Discharge status: Home / Self Care | Payer: Self-pay | Source: Ambulatory Visit | Attending: Obstetrics & Gynecology

## 2015-07-11 ENCOUNTER — Encounter (HOSPITAL_COMMUNITY): Payer: Self-pay | Admitting: Emergency Medicine

## 2015-07-11 DIAGNOSIS — Z79899 Other long term (current) drug therapy: Secondary | ICD-10-CM | POA: Diagnosis not present

## 2015-07-11 DIAGNOSIS — N831 Corpus luteum cyst: Secondary | ICD-10-CM | POA: Insufficient documentation

## 2015-07-11 DIAGNOSIS — D251 Intramural leiomyoma of uterus: Secondary | ICD-10-CM | POA: Diagnosis not present

## 2015-07-11 DIAGNOSIS — F419 Anxiety disorder, unspecified: Secondary | ICD-10-CM | POA: Diagnosis not present

## 2015-07-11 DIAGNOSIS — N830 Follicular cyst of ovary: Secondary | ICD-10-CM | POA: Diagnosis not present

## 2015-07-11 DIAGNOSIS — Z90710 Acquired absence of both cervix and uterus: Secondary | ICD-10-CM | POA: Diagnosis present

## 2015-07-11 DIAGNOSIS — Z8041 Family history of malignant neoplasm of ovary: Secondary | ICD-10-CM | POA: Insufficient documentation

## 2015-07-11 DIAGNOSIS — K219 Gastro-esophageal reflux disease without esophagitis: Secondary | ICD-10-CM | POA: Insufficient documentation

## 2015-07-11 DIAGNOSIS — N838 Other noninflammatory disorders of ovary, fallopian tube and broad ligament: Secondary | ICD-10-CM | POA: Diagnosis not present

## 2015-07-11 DIAGNOSIS — N939 Abnormal uterine and vaginal bleeding, unspecified: Secondary | ICD-10-CM | POA: Diagnosis present

## 2015-07-11 DIAGNOSIS — F329 Major depressive disorder, single episode, unspecified: Secondary | ICD-10-CM | POA: Diagnosis not present

## 2015-07-11 DIAGNOSIS — Z880 Allergy status to penicillin: Secondary | ICD-10-CM | POA: Insufficient documentation

## 2015-07-11 DIAGNOSIS — F17210 Nicotine dependence, cigarettes, uncomplicated: Secondary | ICD-10-CM | POA: Insufficient documentation

## 2015-07-11 DIAGNOSIS — E0590 Thyrotoxicosis, unspecified without thyrotoxic crisis or storm: Secondary | ICD-10-CM | POA: Diagnosis not present

## 2015-07-11 DIAGNOSIS — Z882 Allergy status to sulfonamides status: Secondary | ICD-10-CM | POA: Diagnosis not present

## 2015-07-11 DIAGNOSIS — I10 Essential (primary) hypertension: Secondary | ICD-10-CM | POA: Diagnosis not present

## 2015-07-11 DIAGNOSIS — N711 Chronic inflammatory disease of uterus: Secondary | ICD-10-CM | POA: Diagnosis not present

## 2015-07-11 DIAGNOSIS — N736 Female pelvic peritoneal adhesions (postinfective): Secondary | ICD-10-CM | POA: Insufficient documentation

## 2015-07-11 HISTORY — PX: LAPAROSCOPIC BILATERAL SALPINGECTOMY: SHX5889

## 2015-07-11 HISTORY — PX: LAPAROSCOPIC ASSISTED VAGINAL HYSTERECTOMY: SHX5398

## 2015-07-11 LAB — PREGNANCY, URINE: Preg Test, Ur: NEGATIVE

## 2015-07-11 SURGERY — HYSTERECTOMY, VAGINAL, LAPAROSCOPY-ASSISTED
Anesthesia: General | Site: Abdomen

## 2015-07-11 MED ORDER — HYDROMORPHONE HCL 1 MG/ML IJ SOLN
0.2500 mg | INTRAMUSCULAR | Status: DC | PRN
  Administered 2015-07-11 (×2): 0.5 mg via INTRAVENOUS

## 2015-07-11 MED ORDER — LIDOCAINE HCL (CARDIAC) 20 MG/ML IV SOLN
INTRAVENOUS | Status: DC | PRN
  Administered 2015-07-11: 100 mg via INTRAVENOUS

## 2015-07-11 MED ORDER — MECLIZINE HCL 25 MG PO TABS
25.0000 mg | ORAL_TABLET | Freq: Two times a day (BID) | ORAL | Status: DC | PRN
  Filled 2015-07-11: qty 1

## 2015-07-11 MED ORDER — HYDROMORPHONE HCL 1 MG/ML IJ SOLN
1.0000 mg | INTRAMUSCULAR | Status: DC | PRN
  Administered 2015-07-11: 1 mg via INTRAVENOUS
  Filled 2015-07-11: qty 1

## 2015-07-11 MED ORDER — ACETAMINOPHEN 160 MG/5ML PO SOLN
975.0000 mg | Freq: Once | ORAL | Status: AC
  Administered 2015-07-11: 975 mg via ORAL

## 2015-07-11 MED ORDER — ONDANSETRON HCL 4 MG/2ML IJ SOLN
INTRAMUSCULAR | Status: DC | PRN
  Administered 2015-07-11: 4 mg via INTRAVENOUS

## 2015-07-11 MED ORDER — ONDANSETRON HCL 4 MG/2ML IJ SOLN: 4.0000 mg | Freq: Four times a day (QID) | INTRAMUSCULAR | Status: DC | PRN

## 2015-07-11 MED ORDER — BUPIVACAINE HCL (PF) 0.5 % IJ SOLN
INTRAMUSCULAR | Status: AC
  Filled 2015-07-11: qty 60

## 2015-07-11 MED ORDER — MENTHOL 3 MG MT LOZG: 1.0000 | LOZENGE | OROMUCOSAL | Status: DC | PRN

## 2015-07-11 MED ORDER — MIDAZOLAM HCL 2 MG/2ML IJ SOLN
INTRAMUSCULAR | Status: AC
  Filled 2015-07-11: qty 2

## 2015-07-11 MED ORDER — HYDROMORPHONE HCL 1 MG/ML IJ SOLN
INTRAMUSCULAR | Status: AC
  Filled 2015-07-11: qty 1

## 2015-07-11 MED ORDER — IBUPROFEN 600 MG PO TABS
600.0000 mg | ORAL_TABLET | Freq: Four times a day (QID) | ORAL | Status: DC | PRN
  Administered 2015-07-11 – 2015-07-12 (×2): 600 mg via ORAL
  Filled 2015-07-11 (×2): qty 1

## 2015-07-11 MED ORDER — NEOSTIGMINE METHYLSULFATE 10 MG/10ML IV SOLN
INTRAVENOUS | Status: DC | PRN
  Administered 2015-07-11: 1 mg via INTRAVENOUS

## 2015-07-11 MED ORDER — BUPIVACAINE HCL (PF) 0.5 % IJ SOLN
INTRAMUSCULAR | Status: DC | PRN
  Administered 2015-07-11: 8 mL

## 2015-07-11 MED ORDER — BUPIVACAINE-EPINEPHRINE (PF) 0.5% -1:200000 IJ SOLN
INTRAMUSCULAR | Status: AC
  Filled 2015-07-11: qty 30

## 2015-07-11 MED ORDER — MIDAZOLAM HCL 2 MG/2ML IJ SOLN
INTRAMUSCULAR | Status: DC | PRN
  Administered 2015-07-11 (×2): 2 mg via INTRAVENOUS

## 2015-07-11 MED ORDER — BUPIVACAINE-EPINEPHRINE 0.5% -1:200000 IJ SOLN
INTRAMUSCULAR | Status: DC | PRN
  Administered 2015-07-11: 25 mL

## 2015-07-11 MED ORDER — ONDANSETRON HCL 4 MG PO TABS: 4.0000 mg | ORAL_TABLET | Freq: Four times a day (QID) | ORAL | Status: DC | PRN

## 2015-07-11 MED ORDER — PANTOPRAZOLE SODIUM 40 MG PO TBEC
40.0000 mg | DELAYED_RELEASE_TABLET | Freq: Every day | ORAL | Status: DC
  Administered 2015-07-12: 40 mg via ORAL
  Filled 2015-07-11: qty 1

## 2015-07-11 MED ORDER — PROPOFOL 10 MG/ML IV BOLUS
INTRAVENOUS | Status: AC
  Filled 2015-07-11: qty 20

## 2015-07-11 MED ORDER — CLONAZEPAM 0.5 MG PO TABS
0.5000 mg | ORAL_TABLET | Freq: Two times a day (BID) | ORAL | Status: DC | PRN
  Administered 2015-07-11: 0.5 mg via ORAL
  Filled 2015-07-11: qty 1

## 2015-07-11 MED ORDER — LACTATED RINGERS IV SOLN
INTRAVENOUS | Status: DC | PRN
  Administered 2015-07-11 (×2): via INTRAVENOUS

## 2015-07-11 MED ORDER — TRIAMTERENE-HCTZ 37.5-25 MG PO TABS
1.0000 | ORAL_TABLET | Freq: Every day | ORAL | Status: DC
  Administered 2015-07-12: 1 via ORAL
  Filled 2015-07-11 (×2): qty 1

## 2015-07-11 MED ORDER — LACTATED RINGERS IV SOLN: INTRAVENOUS | Status: DC

## 2015-07-11 MED ORDER — LACTATED RINGERS IV SOLN
INTRAVENOUS | Status: DC
  Administered 2015-07-11 – 2015-07-12 (×2): via INTRAVENOUS

## 2015-07-11 MED ORDER — LACTATED RINGERS IV SOLN
INTRAVENOUS | Status: DC
  Administered 2015-07-11: 07:00:00 via INTRAVENOUS

## 2015-07-11 MED ORDER — ROCURONIUM BROMIDE 100 MG/10ML IV SOLN
INTRAVENOUS | Status: DC | PRN
  Administered 2015-07-11: 10 mg via INTRAVENOUS
  Administered 2015-07-11: 40 mg via INTRAVENOUS

## 2015-07-11 MED ORDER — FENTANYL CITRATE (PF) 250 MCG/5ML IJ SOLN
INTRAMUSCULAR | Status: AC
  Filled 2015-07-11: qty 5

## 2015-07-11 MED ORDER — SCOPOLAMINE 1 MG/3DAYS TD PT72: 1.0000 | MEDICATED_PATCH | Freq: Once | TRANSDERMAL | Status: DC

## 2015-07-11 MED ORDER — PHENYLEPHRINE 40 MCG/ML (10ML) SYRINGE FOR IV PUSH (FOR BLOOD PRESSURE SUPPORT)
PREFILLED_SYRINGE | INTRAVENOUS | Status: AC
  Filled 2015-07-11: qty 10

## 2015-07-11 MED ORDER — GLYCOPYRROLATE 0.2 MG/ML IJ SOLN
INTRAMUSCULAR | Status: AC
  Filled 2015-07-11: qty 1

## 2015-07-11 MED ORDER — ROCURONIUM BROMIDE 100 MG/10ML IV SOLN
INTRAVENOUS | Status: AC
  Filled 2015-07-11: qty 1

## 2015-07-11 MED ORDER — NICOTINE 21 MG/24HR TD PT24
21.0000 mg | MEDICATED_PATCH | Freq: Every day | TRANSDERMAL | Status: DC
  Administered 2015-07-11: 21 mg via TRANSDERMAL
  Filled 2015-07-11 (×3): qty 1

## 2015-07-11 MED ORDER — 0.9 % SODIUM CHLORIDE (POUR BTL) OPTIME
TOPICAL | Status: DC | PRN
  Administered 2015-07-11: 2000 mL

## 2015-07-11 MED ORDER — SCOPOLAMINE 1 MG/3DAYS TD PT72
1.0000 | MEDICATED_PATCH | TRANSDERMAL | Status: DC
  Administered 2015-07-11: 1.5 mg via TRANSDERMAL

## 2015-07-11 MED ORDER — KETOROLAC TROMETHAMINE 30 MG/ML IJ SOLN
INTRAMUSCULAR | Status: AC
  Filled 2015-07-11: qty 1

## 2015-07-11 MED ORDER — KETOROLAC TROMETHAMINE 30 MG/ML IJ SOLN: 30.0000 mg | Freq: Once | INTRAMUSCULAR | Status: DC

## 2015-07-11 MED ORDER — PROPOFOL 10 MG/ML IV BOLUS
INTRAVENOUS | Status: DC | PRN
  Administered 2015-07-11: 180 mg via INTRAVENOUS

## 2015-07-11 MED ORDER — SIMETHICONE 80 MG PO CHEW
80.0000 mg | CHEWABLE_TABLET | Freq: Four times a day (QID) | ORAL | Status: DC | PRN
  Administered 2015-07-12: 80 mg via ORAL
  Filled 2015-07-11: qty 1

## 2015-07-11 MED ORDER — GLYCOPYRROLATE 0.2 MG/ML IJ SOLN
INTRAMUSCULAR | Status: DC | PRN
  Administered 2015-07-11: 0.2 mg via INTRAVENOUS

## 2015-07-11 MED ORDER — OXYCODONE-ACETAMINOPHEN 5-325 MG PO TABS
1.0000 | ORAL_TABLET | ORAL | Status: DC | PRN
  Administered 2015-07-11 – 2015-07-12 (×5): 2 via ORAL
  Filled 2015-07-11 (×5): qty 2

## 2015-07-11 MED ORDER — FENTANYL CITRATE (PF) 100 MCG/2ML IJ SOLN
INTRAMUSCULAR | Status: DC | PRN
  Administered 2015-07-11: 100 ug via INTRAVENOUS
  Administered 2015-07-11: 25 ug via INTRAVENOUS
  Administered 2015-07-11: 100 ug via INTRAVENOUS
  Administered 2015-07-11: 25 ug via INTRAVENOUS

## 2015-07-11 MED ORDER — CITALOPRAM HYDROBROMIDE 40 MG PO TABS
40.0000 mg | ORAL_TABLET | Freq: Every day | ORAL | Status: DC
Start: 2015-07-12 — End: 2015-07-12
  Administered 2015-07-12: 40 mg via ORAL
  Filled 2015-07-11 (×2): qty 1

## 2015-07-11 MED ORDER — ARIPIPRAZOLE 10 MG PO TABS
10.0000 mg | ORAL_TABLET | Freq: Every day | ORAL | Status: DC
  Administered 2015-07-12: 10 mg via ORAL
  Filled 2015-07-11 (×2): qty 1

## 2015-07-11 MED ORDER — DEXAMETHASONE SODIUM PHOSPHATE 4 MG/ML IJ SOLN
INTRAMUSCULAR | Status: AC
  Filled 2015-07-11: qty 1

## 2015-07-11 MED ORDER — FLUTICASONE PROPIONATE 50 MCG/ACT NA SUSP
2.0000 | Freq: Every day | NASAL | Status: DC
  Administered 2015-07-12: 2 via NASAL
  Filled 2015-07-11: qty 16

## 2015-07-11 MED ORDER — ONDANSETRON HCL 4 MG/2ML IJ SOLN
INTRAMUSCULAR | Status: AC
  Filled 2015-07-11: qty 2

## 2015-07-11 MED ORDER — NEOSTIGMINE METHYLSULFATE 10 MG/10ML IV SOLN
INTRAVENOUS | Status: AC
  Filled 2015-07-11: qty 1

## 2015-07-11 MED ORDER — SCOPOLAMINE 1 MG/3DAYS TD PT72
MEDICATED_PATCH | TRANSDERMAL | Status: AC
  Filled 2015-07-11: qty 1

## 2015-07-11 MED ORDER — KETOROLAC TROMETHAMINE 30 MG/ML IJ SOLN
INTRAMUSCULAR | Status: DC | PRN
  Administered 2015-07-11: 30 mg via INTRAVENOUS

## 2015-07-11 MED ORDER — DEXAMETHASONE SODIUM PHOSPHATE 10 MG/ML IJ SOLN
INTRAMUSCULAR | Status: DC | PRN
  Administered 2015-07-11: 4 mg via INTRAVENOUS

## 2015-07-11 MED ORDER — ACETAMINOPHEN 160 MG/5ML PO SOLN
ORAL | Status: AC
  Filled 2015-07-11: qty 40.6

## 2015-07-11 MED ORDER — LIDOCAINE HCL (CARDIAC) 20 MG/ML IV SOLN
INTRAVENOUS | Status: AC
  Filled 2015-07-11: qty 5

## 2015-07-11 MED ORDER — PHENYLEPHRINE HCL 10 MG/ML IJ SOLN
INTRAMUSCULAR | Status: DC | PRN
  Administered 2015-07-11 (×2): 80 ug via INTRAVENOUS
  Administered 2015-07-11: 40 ug via INTRAVENOUS

## 2015-07-11 SURGICAL SUPPLY — 44 items
APPLICATOR COTTON TIP 6IN STRL (MISCELLANEOUS) ×3 IMPLANT
CABLE HIGH FREQUENCY MONO STRZ (ELECTRODE) IMPLANT
CANISTER SUCT 3000ML (MISCELLANEOUS) ×3 IMPLANT
CATH ROBINSON RED A/P 16FR (CATHETERS) ×3 IMPLANT
CLOTH BEACON ORANGE TIMEOUT ST (SAFETY) ×3 IMPLANT
CONT PATH 16OZ SNAP LID 3702 (MISCELLANEOUS) ×3 IMPLANT
COVER BACK TABLE 60X90IN (DRAPES) ×3 IMPLANT
DECANTER SPIKE VIAL GLASS SM (MISCELLANEOUS) ×6 IMPLANT
DRSG COVADERM PLUS 2X2 (GAUZE/BANDAGES/DRESSINGS) ×6 IMPLANT
DRSG OPSITE POSTOP 3X4 (GAUZE/BANDAGES/DRESSINGS) ×3 IMPLANT
DURAPREP 26ML APPLICATOR (WOUND CARE) ×3 IMPLANT
ELECT REM PT RETURN 9FT ADLT (ELECTROSURGICAL)
ELECTRODE REM PT RTRN 9FT ADLT (ELECTROSURGICAL) IMPLANT
EVACUATOR SMOKE 8.L (FILTER) ×3 IMPLANT
GLOVE BIOGEL PI IND STRL 6.5 (GLOVE) ×2 IMPLANT
GLOVE BIOGEL PI IND STRL 7.0 (GLOVE) ×6 IMPLANT
GLOVE BIOGEL PI INDICATOR 6.5 (GLOVE) ×1
GLOVE BIOGEL PI INDICATOR 7.0 (GLOVE) ×3
GLOVE ECLIPSE 7.0 STRL STRAW (GLOVE) ×6 IMPLANT
GOWN STRL REUS W/TWL LRG LVL3 (GOWN DISPOSABLE) ×6 IMPLANT
LIQUID BAND (GAUZE/BANDAGES/DRESSINGS) ×3 IMPLANT
NEEDLE INSUFFLATION 120MM (ENDOMECHANICALS) IMPLANT
NEEDLE MAYO .5 CIRCLE (NEEDLE) IMPLANT
NS IRRIG 1000ML POUR BTL (IV SOLUTION) ×3 IMPLANT
PACK LAPAROSCOPY BASIN (CUSTOM PROCEDURE TRAY) ×3 IMPLANT
PACK LAVH (CUSTOM PROCEDURE TRAY) ×3 IMPLANT
PACK ROBOTIC GOWN (GOWN DISPOSABLE) ×3 IMPLANT
PAD POSITIONER PINK NONSTERILE (MISCELLANEOUS) ×3 IMPLANT
POUCH SPECIMEN RETRIEVAL 10MM (ENDOMECHANICALS) IMPLANT
PROTECTOR NERVE ULNAR (MISCELLANEOUS) ×3 IMPLANT
SET IRRIG TUBING LAPAROSCOPIC (IRRIGATION / IRRIGATOR) IMPLANT
SHEARS HARMONIC ACE PLUS 36CM (ENDOMECHANICALS) IMPLANT
SUT VIC AB 0 CT1 18XCR BRD8 (SUTURE) ×4 IMPLANT
SUT VIC AB 0 CT1 36 (SUTURE) ×3 IMPLANT
SUT VIC AB 0 CT1 8-18 (SUTURE) ×2
SUT VICRYL 0 TIES 12 18 (SUTURE) ×3 IMPLANT
SUT VICRYL 0 UR6 27IN ABS (SUTURE) ×3 IMPLANT
SUT VICRYL 4-0 PS2 18IN ABS (SUTURE) ×6 IMPLANT
TOWEL OR 17X24 6PK STRL BLUE (TOWEL DISPOSABLE) ×9 IMPLANT
TRAY FOLEY CATH SILVER 14FR (SET/KITS/TRAYS/PACK) ×3 IMPLANT
TROCAR XCEL NON-BLD 11X100MML (ENDOMECHANICALS) ×3 IMPLANT
TROCAR XCEL NON-BLD 5MMX100MML (ENDOMECHANICALS) ×6 IMPLANT
WARMER LAPAROSCOPE (MISCELLANEOUS) ×3 IMPLANT
WATER STERILE IRR 1000ML POUR (IV SOLUTION) ×3 IMPLANT

## 2015-07-11 NOTE — Anesthesia Postprocedure Evaluation (Signed)
  Anesthesia Post-op Note  Patient: Mariah Rice  Procedure(s) Performed: Procedure(s): LAPAROSCOPIC ASSISTED VAGINAL HYSTERECTOMY (N/A) LAPAROSCOPIC BILATERAL SALPINGO OOPHORECTOMY  (Bilateral)  Patient Location: [facility]  Anesthesia Type:General  Level of Consciousness: awake, alert  and oriented  Airway and Oxygen Therapy: Patient Spontanous Breathing and Patient connected to nasal cannula oxygen  Post-op Pain: none  Post-op Assessment: Post-op Vital signs reviewed and Patient's Cardiovascular Status Stable              Post-op Vital Signs: Reviewed and stable  Last Vitals:  Filed Vitals:   07/11/15 1300  BP: 115/64  Pulse: 77  Temp: 36.8 C  Resp: 16    Complications: No apparent anesthesia complications

## 2015-07-11 NOTE — Transfer of Care (Signed)
Immediate Anesthesia Transfer of Care Note  Patient: Mariah Rice  Procedure(s) Performed: Procedure(s): LAPAROSCOPIC ASSISTED VAGINAL HYSTERECTOMY (N/A) LAPAROSCOPIC BILATERAL SALPINGO OOPHORECTOMY  (Bilateral)  Patient Location: PACU  Anesthesia Type:General  Level of Consciousness: awake  Airway & Oxygen Therapy: Patient Spontanous Breathing  Post-op Assessment: Report given to PACU RN  Post vital signs: stable  Filed Vitals:   07/11/15 0704  BP: 125/84  Pulse: 81  Temp: 36.9 C  Resp: 16    Complications: No apparent anesthesia complications

## 2015-07-11 NOTE — Op Note (Signed)
Mariah Rice PROCEDURE DATE: 07/11/2015   PREOPERATIVE DIAGNOSES: Abnormal uterine bleeding, family history of ovarian cancer leading to death of her mother at age 42  POSTOPERATIVE DIAGNOSES: The same PROCEDURE: Laparoscopic assisted vaginal hysterectomy, bilateral salpingoophorectomy SURGEON:  Dr. [name] ASSISTANT: Dr. Silas [place]  INDICATIONS: 44 y.o. [id number] with aforementioned preoperative diagnoses here today for definitive surgical management.   Risks of surgery were discussed with the patient including but not limited to: bleeding which may require transfusion or reoperation; infection which may require antibiotics; injury to bowel, bladder, ureters or other surrounding organs; need for additional procedures including laparotomy; thromboembolic phenomenon, incisional problems and other postoperative/anesthesia complications. Written informed consent was obtained.    FINDINGS:  Small uterus, normal appearing adnexa bilaterally.  No evidence of endometriosis, adhesions or any other abdominal/pelvic abnormality.  Normal upper abdomen.  ANESTHESIA:    General INTRAVENOUS FLUIDS: 2200 ml ESTIMATED BLOOD LOSS: 100 ml  URINE OUTPUT: 100 ml SPECIMENS: Uterus, cervix, bilateral fallopian tubes and ovaries. COMPLICATIONS: None immediate  PROCEDURE IN DETAIL:  The patient received intravenous antibiotics and had sequential compression devices applied to her lower extremities while in the preoperative area.  She was then taken to the operating room where general anesthesia was administered and was found to be adequate.  She was placed in the dorsal lithotomy position, and was prepped and draped in a sterile manner.  A Foley catheter was inserted into her bladder and attached to constant drainage and a uterine manipulator was then advanced into the uterus.  After an adequate timeout was performed, attention was turned to the abdomen where an umbilical incision was made with the scalpel.   The Optiview 11-mm trocar and sleeve were then advanced without difficulty with the laparoscope under direct visualization into the abdomen.  The abdomen was then insufflated with carbon dioxide gas and adequate pneumoperitoneum was obtained. Bilateral 5-mm lower quadrant ports were then placed under direct visualization.  A survey of the patient's pelvis and abdomen revealed the findings as above.  The round ligaments were clamped and transected with the Harmonic device on both sides. The bilateral infundibulopelvic ligaments were also clamped and transected with the Harmonic device.  Excellent hemostasis was noted, the decision was made to leave the trocars in place and proceed with completing the hysterectomy via the vaginal route .  Attention was then turned to her pelvis.  A weighted speculum was then placed in the vagina, and the anterior and posterior lips of the cervix were grasped bilaterally with tenaculums.  The cervix was then injected circumferentially with 0.5% Marcaine with epinephrine solution to maintain hemostasis.  The cervix was then circumferentially incised, and the anterior cul-de-sac was then entered sharply without difficulty and a retractor was placed.  The same procedure was performed posteriorly and the posterior cul-de-sac was entered sharply without difficulty.  A long weighted speculum was inserted into the posterior cul-de-sac.  The Heaney clamp was then used to clamp the uterosacral ligaments on either side.  They were then cut and sutured ligated with 0 Vicryl, and were held with a tag for later identification. Of note, all sutures used in this case were 0 Vicryl unless otherwise noted.   The cardinal ligaments were then clamped, cut and ligated bilaterally. The uterine vessels and broad ligaments were then serially clamped with the Heaney clamps, cut, and suture ligated on both sides.  The uterus and adnexa were noted to be freed from all ligaments and was then delivered and sent  to  pathology.   After completion of the hysterectomy, all pedicles from the uterosacral ligament to the cornua were examined hemostasis was confirmed.   The vaginal cuff was then closed in a running locked fashion with 0 Vicryl with care given to incorporate the uterosacral pedicles bilaterally.  All instruments were then removed from the pelvis.    Attention was then returned to her abdomen which was insufflated again with carbon dioxide gas.  The laparoscope was used to survey the operative site, and it was found to be hemostatic.   No intraoperative injury to other surrounding organs was noted.  The abdomen was desufflated and all instruments were then removed from the patient's abdomen.  The fascial incision of the umbilicus was closed with a 0 Vicryl figure of eight stitch.  All skin incisions were closed with 4-0 Vicryl stitches and Dermabond. The patient tolerated the procedures well.  All instruments, needles, and sponge counts were correct x 3. The patient was taken to the recovery room awake, extubated and in stable condition.      [name], MD, [place] Attending [place] for [employer], [place]

## 2015-07-11 NOTE — H&P (Signed)
Preoperative History and Physical  Mariah Rice is a 44 y.o. [id number] here for surgical management of abnormal uterine bleeding not responsive to long courses of hormonal therapy.  Patient's mother died at age 62 from ovarian cancer; patient has had no genetic testing but strongly desires bilateral oophorectomy. She reiterates this today, strongly wants her ovaries removed and is willing to be on hormone therapy.   No significant preoperative concerns.  Proposed surgery: Laparoscopic-assisted vaginal hysterectomy (LAVH) and prophylactic bilateral salpingoophorectomy (BSO)   Past Medical History  Diagnosis Date  . Depression   . Anxiety   . Dental abscess   . Hypertension   . Shortness of breath dyspnea     with anxiety  . Hyperthyroidism 1990s  . GERD (gastroesophageal reflux disease)   . Headache   . PONV (postoperative nausea and vomiting)    Past Surgical History  Procedure Laterality Date  . Tubal ligation  2000  . Incise and drain abcess  2005   OB History  Gravida Para Term Preterm AB SAB TAB Ectopic Multiple Living  4 2 2  2 2    2     # Outcome Date GA Lbr Len/2nd Weight Sex Delivery Anes PTL Lv  4 SAB           3 SAB           2 Term      Vag-Spont     1 Term      Vag-Spont       Patient denies any other pertinent gynecologic issues.   No current facility-administered medications on file prior to encounter.   Current Outpatient Prescriptions on File Prior to Encounter  Medication Sig Dispense Refill  . ARIPiprazole (ABILIFY) 10 MG tablet Take 1 tablet (10 mg total) by mouth daily. 30 tablet 6  . citalopram (CELEXA) 40 MG tablet Take 1 tablet (40 mg total) by mouth daily. 30 tablet 11  . fluticasone (FLONASE) 50 MCG/ACT nasal spray Place 2 sprays into both nostrils daily. 16 g 11  . megestrol (MEGACE) 40 MG tablet Take 1 tablet (40 mg total) by mouth 2 (two) times daily. Can increase to two tablets twice a day in the event of heavy bleeding (Patient taking  differently: Take 40 mg by mouth 2 (two) times daily. ) 60 tablet 5  . naproxen (NAPROSYN) 500 MG tablet Take 1 tablet (500 mg total) by mouth 2 (two) times daily with a meal. As needed for pain 60 tablet 2  . triamterene-hydrochlorothiazide (MAXZIDE-25) 37.5-25 MG per tablet Take 1 tablet by mouth daily. 90 tablet 3   Allergies  Allergen Reactions  . Penicillins Nausea Only and Rash  . Sulfa Antibiotics Nausea Only and Rash    Social History:   reports that she has been smoking Cigarettes.  She has been smoking about 0.50 packs per day. She does not have any smokeless tobacco history on file. She reports that she drinks alcohol. She reports that she does not use illicit drugs.  Family History  Problem Relation Age of Onset  . Cancer Mother     cervical cancer  . Heart disease Father   . Hypertension Father   . COPD Father   . Hypertension Paternal Aunt   . Thyroid disease Maternal Grandmother   . Hypertension Paternal Grandmother   . Heart disease Paternal Grandmother     Review of Systems: Noncontributory  PHYSICAL EXAM: Blood pressure 125/84, pulse 81, temperature 98.4 F (36.9 C), temperature source Oral, resp.  rate 16, last menstrual period 02/10/2015, SpO2 98 %. CONSTITUTIONAL: Well-developed, well-nourished female in no acute distress.  HENT:  Normocephalic, atraumatic, External right and left ear normal. Oropharynx is clear and moist EYES: Conjunctivae and EOM are normal. Pupils are equal, round, and reactive to light. No scleral icterus.  NECK: Normal range of motion, supple, no masses SKIN: Skin is warm and dry. No rash noted. Not diaphoretic. No erythema. No pallor. [place]: Alert and oriented to person, place, and time. Normal reflexes, muscle tone coordination. No cranial nerve deficit noted. PSYCHIATRIC: Normal mood and affect. Normal behavior. Normal judgment and thought content. CARDIOVASCULAR: Normal heart rate noted, regular rhythm RESPIRATORY: Effort and  breath sounds normal, no problems with respiration noted ABDOMEN: Soft, nontender, nondistended. PELVIC: Deferred MUSCULOSKELETAL: Normal range of motion. No edema and no tenderness. 2+ distal pulses.  Labs: Results for orders placed or performed during the hospital encounter of 07/11/15 (from the past 336 hour(s))  Pregnancy, urine   Collection Time: 07/11/15  6:55 AM  Result Value Ref Range   Preg Test, Ur NEGATIVE NEGATIVE  Results for orders placed or performed during the hospital encounter of 06/29/15 (from the past 336 hour(s))  CBC   Collection Time: 06/29/15 10:25 AM  Result Value Ref Range   WBC 7.9 4.0 - 10.5 K/uL   RBC 4.60 3.87 - 5.11 MIL/uL   Hemoglobin 14.9 12.0 - 15.0 g/dL   HCT 43.0 36.0 - 46.0 %   MCV 93.5 78.0 - 100.0 fL   MCH 32.4 26.0 - 34.0 pg   MCHC 34.7 30.0 - 36.0 g/dL   RDW 14.6 11.5 - 15.5 %   Platelets 285 150 - 400 K/uL  Basic metabolic panel   Collection Time: 06/29/15 10:25 AM  Result Value Ref Range   Sodium 138 135 - 145 mmol/L   Potassium 3.6 3.5 - 5.1 mmol/L   Chloride 106 101 - 111 mmol/L   CO2 26 22 - 32 mmol/L   Glucose, Bld 89 65 - 99 mg/dL   BUN 10 6 - 20 mg/dL   Creatinine, Ser 1.01 (H) 0.44 - 1.00 mg/dL   Calcium 9.1 8.9 - 10.3 mg/dL   GFR calc non Af Amer >60 >60 mL/min   GFR calc Af Amer >60 >60 mL/min   Anion gap 6 5 - 15  ABO/Rh   Collection Time: 06/29/15 10:25 AM  Result Value Ref Range   ABO/RH(D) O POS   Type and screen   Collection Time: 06/29/15 10:37 AM  Result Value Ref Range   ABO/RH(D) O POS    Antibody Screen NEG    Sample Expiration 07/13/2015     Imaging Studies: 05/11/2015  TRANSABDOMINAL AND TRANSVAGINAL ULTRASOUND OF PELVIS CLINICAL DATA: Menometrorrhagia x1 year  COMPARISON: None FINDINGS: Uterus Measurements: 8.0 x 4.5 x 5.3 cm. No fibroids or other mass visualized. Endometrium Thickness: 5 mm. No focal abnormality visualized. Right ovary  Measurements: 3.7 x 3.0 x 3.0 cm. 3.2 x 2.6 x 2.5 cm  simple cyst/follicle. Left ovary Measurements: 2.6 x 1.7 x 1.9 cm. Normal appearance/no adnexal mass. Other findings  No free fluid. IMPRESSION:  3.2 cm simple right ovarian cyst/follicle, likely physiologic. Consider follow-up pelvic ultrasound in 6-12 weeks as clinically warranted. Otherwise negative pelvic ultrasound.   Assessment: Patient Active Problem List   Diagnosis Date Noted  . Chronic endometritis 05/07/2015  . FH: ovarian cancer in first degree relative 05/04/2015  . Back pain 03/15/2015  . Dermoid cyst of arm 12/19/2014  .  Environmental allergies 09/30/2013  . Abnormal uterine bleeding 04/05/2013  . Depression 01/06/2013  . GAD (generalized anxiety disorder) 01/06/2013  . Tobacco abuse 11/10/2012  . Overweight 11/10/2012  . Hypertension 11/10/2012    Plan: Patient will undergo surgical management with laparoscopic-assisted vaginal hysterectomy (LAVH) for AUB not responsive to hormonal therapy, and prophylactic bilateral salpingoophorectomy (BSO) given family history of ovarian cancer leading to death of her mother at age 49.  She was counseled that oophorectomy and associated surgical menopause could have major health risks such as loss of ovarian hormones that could lead to vasomotor symptoms and osteoporosis, increased risk of cardiovascular disease, dementia, procedure-related complications and increased overall mortality. Recommended genetic testing and further contemplation about this matter but she strongly wants to proceed with BSO today. She agrees to take hormone replacement therapy with its associated risks; patient also is a smoker and was counseled about increased risk of thromboembolic phenomenon.  She desires Nicotine patch while in hospital, will follow up with referral to smoking cessation programs postoperatively.  The risks of surgery were discussed in detail with the patient including but not limited to: bleeding which may require transfusion or  reoperation; infection which may require antibiotics; injury to bowel, bladder, ureters or other surrounding organs; need for additional procedures including laparotomy; thromboembolic phenomenon, incisional problems and other postoperative/anesthesia complications. Patient was also advised that she will remain in house for 1 night; and expected recovery time after a hysterectomy is 6-8 weeks. Likelihood of success in alleviating the patient's symptoms was discussed. Routine postoperative instructions will be reviewed with the patient and her family in detail after surgery.  The patient concurred with the proposed plan, giving informed written consent for the surgery.  Patient has been NPO since last night she will remain NPO for procedure.  Anesthesia and OR aware.  Preoperative prophylactic antibiotics and SCDs ordered on call to the OR.  To OR when ready.   [name], M.D. 07/11/2015 7:53 AM

## 2015-07-11 NOTE — Anesthesia Procedure Notes (Signed)
Procedure Name: Intubation Date/Time: 07/11/2015 8:18 AM Performed by: Casimer Lanius A Pre-anesthesia Checklist: Patient identified, Emergency Drugs available, Suction available and Patient being monitored Patient Re-evaluated:Patient Re-evaluated prior to inductionOxygen Delivery Method: Circle system utilized and Simple face mask Preoxygenation: Pre-oxygenation with 100% oxygen Intubation Type: IV induction Ventilation: Mask ventilation without difficulty Laryngoscope Size: Mac and 3 Grade View: Grade III Tube type: Oral Tube size: 7.0 mm Number of attempts: 1 Airway Equipment and Method: Stylet Placement Confirmation: ETT inserted through vocal cords under direct vision,  positive ETCO2 and breath sounds checked- equal and bilateral Secured at: 20 (right lip) cm Tube secured with: Tape Dental Injury: Teeth and Oropharynx as per pre-operative assessment

## 2015-07-12 ENCOUNTER — Encounter (HOSPITAL_COMMUNITY): Payer: Self-pay | Admitting: Obstetrics & Gynecology

## 2015-07-12 DIAGNOSIS — D251 Intramural leiomyoma of uterus: Secondary | ICD-10-CM | POA: Diagnosis not present

## 2015-07-12 LAB — CBC
HCT: 37.3 % (ref 36.0–46.0)
Hemoglobin: 12.6 g/dL (ref 12.0–15.0)
MCH: 32.1 pg (ref 26.0–34.0)
MCHC: 33.8 g/dL (ref 30.0–36.0)
MCV: 94.9 fL (ref 78.0–100.0)
Platelets: 236 K/uL (ref 150–400)
RBC: 3.93 MIL/uL (ref 3.87–5.11)
RDW: 15.2 % (ref 11.5–15.5)
WBC: 11.5 K/uL — ABNORMAL HIGH (ref 4.0–10.5)

## 2015-07-12 MED ORDER — NICOTINE 21 MG/24HR TD PT24: 21.0000 mg | MEDICATED_PATCH | Freq: Every day | TRANSDERMAL | Status: DC

## 2015-07-12 MED ORDER — OXYCODONE-ACETAMINOPHEN 5-325 MG PO TABS: 1.0000 | ORAL_TABLET | Freq: Four times a day (QID) | ORAL | Status: DC | PRN

## 2015-07-12 MED ORDER — IBUPROFEN 600 MG PO TABS: 600.0000 mg | ORAL_TABLET | Freq: Four times a day (QID) | ORAL | Status: DC | PRN

## 2015-07-12 MED ORDER — ESTRADIOL 1 MG PO TABS: 1.0000 mg | ORAL_TABLET | Freq: Every day | ORAL | Status: DC

## 2015-07-12 MED ORDER — DOCUSATE SODIUM 100 MG PO CAPS: 100.0000 mg | ORAL_CAPSULE | Freq: Two times a day (BID) | ORAL | Status: DC | PRN

## 2015-07-12 NOTE — Progress Notes (Signed)
Pt ambulated out teaching complete  

## 2015-07-12 NOTE — Discharge Instructions (Signed)

## 2015-07-12 NOTE — Discharge Summary (Addendum)
Gynecology Physician Postoperative Discharge Summary  Patient ID: Mariah Rice MRN: [id number] DOB/AGE: 1971-01-09 44 y.o.  Admit Date: 07/11/2015 Discharge Date: 07/12/2015  Preoperative Diagnoses: Abnormal uterine bleeding, family history of ovarian cancer leading to death of her mother at age 66   Procedures: Procedure(s) (LRB): LAPAROSCOPIC ASSISTED VAGINAL HYSTERECTOMY (N/A) LAPAROSCOPIC BILATERAL SALPINGO OOPHORECTOMY  (Bilateral)  Significant Labs: CBC Latest Ref Rng 07/12/2015 06/29/2015 09/22/2013  WBC 4.0 - 10.5 K/uL 11.5(H) 7.9 8.7  Hemoglobin 12.0 - 15.0 g/dL 12.6 14.9 15.0  Hematocrit 36.0 - 46.0 % 37.3 43.0 43.1  Platelets 150 - 400 K/uL 236 285 254    Hospital Course:  Mariah Rice is a 44 y.o. [id number]  admitted for scheduled surgery.  She underwent the procedures as mentioned above, her operation was uncomplicated. For further details about surgery, please refer to the operative report. Patient had an uncomplicated postoperative course. By time of discharge on POD#2, her pain was controlled on oral pain medications; she was ambulating, voiding without difficulty, tolerating regular diet and passing flatus. She was deemed stable for discharge to home.  She was started on hormone replacement therapy at time of discharge.  Discharge Exam: Blood pressure 109/45, pulse 64, temperature 98.8 F (37.1 C), temperature source Oral, resp. rate 18, height 5\' 4"  (1.626 m), weight 194 lb (87.998 kg), last menstrual period 02/10/2015, SpO2 100 %. General appearance: alert and no distress  Resp: clear to auscultation bilaterally  Cardio: regular rate and rhythm  GI: soft, non-tender; bowel sounds normal; no masses, no organomegaly.  Incision: C/D/I, no erythema,  old drainage noted Pelvic: scant blood on pad  Extremities: extremities normal, atraumatic, no cyanosis or edema and Homans sign is negative, no sign of DVT  Discharged Condition: Stable  Disposition: 01-Home or Self  Care     Medication List    TAKE these medications        ARIPiprazole 10 MG tablet  Commonly known as:  ABILIFY  Take 1 tablet (10 mg total) by mouth daily.     citalopram 40 MG tablet  Commonly known as:  CELEXA  Take 1 tablet (40 mg total) by mouth daily.     clonazePAM 0.5 MG tablet  Commonly known as:  KLONOPIN  Take 1 tablet (0.5 mg total) by mouth 2 (two) times daily as needed for anxiety.     docusate sodium 100 MG capsule  Commonly known as:  COLACE  Take 1 capsule (100 mg total) by mouth 2 (two) times daily as needed.     estradiol 1 MG tablet  Commonly known as:  ESTRACE  Take 1 tablet (1 mg total) by mouth daily.     fluticasone 50 MCG/ACT nasal spray  Commonly known as:  FLONASE  Place 2 sprays into both nostrils daily.     ibuprofen 800 MG tablet  Commonly known as:  ADVIL,MOTRIN  Take 1 tablet (800 mg total) by mouth every 8 (eight) hours as needed.     ibuprofen 600 MG tablet  Commonly known as:  ADVIL,MOTRIN  Take 1 tablet (600 mg total) by mouth every 6 (six) hours as needed (mild pain).     meclizine 25 MG tablet  Commonly known as:  ANTIVERT  Take 0.5-1 tablets (12.5-25 mg total) by mouth 3 (three) times daily as needed.     nicotine 21 mg/24hr patch  Commonly known as:  NICODERM CQ - dosed in mg/24 hours  Place 1 patch (21 mg total) onto the skin daily.  oxyCODONE-acetaminophen 5-325 MG per tablet  Commonly known as:  PERCOCET/ROXICET  Take 1-2 tablets by mouth every 6 (six) hours as needed for severe pain (moderate to severe pain (when tolerating fluids)).     triamterene-hydrochlorothiazide 37.5-25 MG per tablet  Commonly known as:  MAXZIDE-25  Take 1 tablet by mouth daily.       Follow-up Information    Follow up with Osborne Oman, MD On 08/02/2015.   Specialty:  Obstetrics and Gynecology   Why:  3:30 pm for postoperative appointment. Call clinic/come to MAU for any concerning issues   Contact information:   [place] [address] [phone]       Signed:  [name], MD, Kelly Attending [place], [facility]

## 2015-07-17 ENCOUNTER — Ambulatory Visit (HOSPITAL_COMMUNITY): Admission: RE | Admit: 2015-07-17 | Payer: 59 | Source: Ambulatory Visit

## 2015-07-20 ENCOUNTER — Telehealth: Payer: Self-pay | Admitting: *Deleted

## 2015-07-20 NOTE — Telephone Encounter (Signed)
Patient reports pain when having bowel movements following her hysterectomy. I advised patient to get the stool softener that was prescribed as well as pick up some miralax to help with bm. Patient is agreeable to this and will call back if problem persists.

## 2015-07-24 ENCOUNTER — Ambulatory Visit (HOSPITAL_COMMUNITY): Admission: RE | Admit: 2015-07-24 | Payer: 59 | Source: Ambulatory Visit

## 2015-07-25 ENCOUNTER — Telehealth: Payer: Self-pay | Admitting: *Deleted

## 2015-07-25 NOTE — Telephone Encounter (Addendum)
Pt contacted the clinic statin that she is post op hysterectomy 2 wks and woke up this am with bleeding and she is unsure what she should do next.  Attempted to contact patient, no answer, left message for patient to contact the clinic.  8/10  1145  Called pt and left message on her personal voice mail. I stated that if her bleeding is a light amount, regardless of color (pink, red, brown) she does not need to do anything. If the bleeding is bright red and moderate to heavy in amount                       such as a period, she should go to Maternity Admission for evaluation. Also she should come to the hospital if she is having any severe pain. She may call back for additional questions, otherwise keep clinic appt as scheduled on                               8/17@ 1545.   Diane Day RNC

## 2015-07-26 ENCOUNTER — Inpatient Hospital Stay (HOSPITAL_COMMUNITY)
Admission: AD | Admit: 2015-07-26 | Discharge: 2015-07-26 | Disposition: A | Discharge status: Home / Self Care | Payer: 59 | Source: Ambulatory Visit | Attending: Obstetrics & Gynecology | Admitting: Obstetrics & Gynecology

## 2015-07-26 ENCOUNTER — Encounter (HOSPITAL_COMMUNITY): Payer: Self-pay | Admitting: *Deleted

## 2015-07-26 DIAGNOSIS — N99821 Postprocedural hemorrhage and hematoma of a genitourinary system organ or structure following other procedure: Secondary | ICD-10-CM

## 2015-07-26 DIAGNOSIS — G8918 Other acute postprocedural pain: Secondary | ICD-10-CM | POA: Insufficient documentation

## 2015-07-26 DIAGNOSIS — F17210 Nicotine dependence, cigarettes, uncomplicated: Secondary | ICD-10-CM | POA: Diagnosis not present

## 2015-07-26 DIAGNOSIS — IMO0002 Reserved for concepts with insufficient information to code with codable children: Secondary | ICD-10-CM

## 2015-07-26 DIAGNOSIS — Z880 Allergy status to penicillin: Secondary | ICD-10-CM | POA: Insufficient documentation

## 2015-07-26 LAB — URINALYSIS, ROUTINE W REFLEX MICROSCOPIC
Bilirubin Urine: NEGATIVE
Glucose, UA: NEGATIVE mg/dL
Ketones, ur: NEGATIVE mg/dL
Nitrite: NEGATIVE
PROTEIN: NEGATIVE mg/dL
Specific Gravity, Urine: 1.01 (ref 1.005–1.030)
Urobilinogen, UA: 0.2 mg/dL (ref 0.0–1.0)
pH: 5.5 (ref 5.0–8.0)

## 2015-07-26 LAB — CBC
HEMATOCRIT: 36.8 % (ref 36.0–46.0)
Hemoglobin: 12.9 g/dL (ref 12.0–15.0)
MCH: 32.4 pg (ref 26.0–34.0)
MCHC: 35.1 g/dL (ref 30.0–36.0)
MCV: 92.5 fL (ref 78.0–100.0)
Platelets: 585 10*3/uL — ABNORMAL HIGH (ref 150–400)
RBC: 3.98 MIL/uL (ref 3.87–5.11)
RDW: 14.2 % (ref 11.5–15.5)
WBC: 13.4 10*3/uL — ABNORMAL HIGH (ref 4.0–10.5)

## 2015-07-26 LAB — URINE MICROSCOPIC-ADD ON

## 2015-07-26 MED ORDER — KETOROLAC TROMETHAMINE 60 MG/2ML IM SOLN
60.0000 mg | Freq: Once | INTRAMUSCULAR | Status: AC
  Administered 2015-07-26: 60 mg via INTRAMUSCULAR
  Filled 2015-07-26: qty 2

## 2015-07-26 MED ORDER — AMOXICILLIN-POT CLAVULANATE 500-125 MG PO TABS: 1.0000 | ORAL_TABLET | Freq: Two times a day (BID) | ORAL | Status: DC

## 2015-07-26 MED ORDER — METRONIDAZOLE 500 MG PO TABS: 500.0000 mg | ORAL_TABLET | Freq: Two times a day (BID) | ORAL | Status: DC

## 2015-07-26 NOTE — MAU Provider Note (Signed)
History     CSN: [id number]  Arrival date and time: 07/26/15 1148   First Provider Initiated Contact with Patient 07/26/15 1312      Chief Complaint  Patient presents with  . Post-op Problem   HPI Post op s/p LAVH& BSO on 07/11/2015 for AUB -and BSO (mother died of Ovarian Ca) Pt was discharged home on 7/27 and did well until yesterday when pt woke up at 3:30 am when underwear full of blood and then saturated another pad. Pt has also had some lower abdominal dull ache/cramp all along lower abdomen. Pt had normal BM with morning. Denies UTI sx. Pt has decreased appetite. Pt is on estradiol PO for ERT and is on ibuprofen for pain Pt took one this morning- pt is out percocet. Pt is more comfortable in one position- movement makes it worse. Pt has not had IC since surgery Pt does not know if she has had a fever or chills- is having hot flashes from surgical menopause- day and night  Past Medical History  Diagnosis Date  . Depression   . Anxiety   . Dental abscess   . Hypertension   . Shortness of breath dyspnea     with anxiety  . Hyperthyroidism 1990s  . GERD (gastroesophageal reflux disease)   . Headache   . PONV (postoperative nausea and vomiting)     Past Surgical History  Procedure Laterality Date  . Tubal ligation  2000  . Incise and drain abcess  2005  . Laparoscopic assisted vaginal hysterectomy N/A 07/11/2015    Procedure: LAPAROSCOPIC ASSISTED VAGINAL HYSTERECTOMY;  Surgeon: Osborne Oman, MD;  Location: [facility];  Service: Gynecology;  Laterality: N/A;  . Laparoscopic bilateral salpingectomy Bilateral 07/11/2015    Procedure: LAPAROSCOPIC BILATERAL SALPINGO OOPHORECTOMY ;  Surgeon: Osborne Oman, MD;  Location: [facility];  Service: Gynecology;  Laterality: Bilateral;    Family History  Problem Relation Age of Onset  . Cancer Mother     cervical cancer  . Heart disease Father   . Hypertension Father   . COPD Father   . Hypertension Paternal Aunt   .  Thyroid disease Maternal Grandmother   . Hypertension Paternal Grandmother   . Heart disease Paternal Grandmother     Social History  Substance Use Topics  . Smoking status: Current Every Day Smoker -- 0.50 packs/day    Types: Cigarettes  . Smokeless tobacco: None  . Alcohol Use: 0.0 oz/week    0 Standard drinks or equivalent per week     Comment: occ    Allergies:  Allergies  Allergen Reactions  . Penicillins Nausea Only and Rash  . Sulfa Antibiotics Nausea Only and Rash    Prescriptions prior to admission  Medication Sig Dispense Refill Last Dose  . ARIPiprazole (ABILIFY) 10 MG tablet Take 1 tablet (10 mg total) by mouth daily. 30 tablet 6 07/11/2015 at Unknown time  . citalopram (CELEXA) 40 MG tablet Take 1 tablet (40 mg total) by mouth daily. 30 tablet 11 07/11/2015 at Unknown time  . clonazePAM (KLONOPIN) 0.5 MG tablet Take 1 tablet (0.5 mg total) by mouth 2 (two) times daily as needed for anxiety. 60 tablet 1 07/11/2015 at Unknown time  . docusate sodium (COLACE) 100 MG capsule Take 1 capsule (100 mg total) by mouth 2 (two) times daily as needed. 30 capsule 2   . estradiol (ESTRACE) 1 MG tablet Take 1 tablet (1 mg total) by mouth daily. 90 tablet 5   . fluticasone (  FLONASE) 50 MCG/ACT nasal spray Place 2 sprays into both nostrils daily. 16 g 11 Past Week at Unknown time  . ibuprofen (ADVIL,MOTRIN) 600 MG tablet Take 1 tablet (600 mg total) by mouth every 6 (six) hours as needed (mild pain). 60 tablet 3   . ibuprofen (ADVIL,MOTRIN) 800 MG tablet Take 1 tablet (800 mg total) by mouth every 8 (eight) hours as needed. (Patient taking differently: Take 800 mg by mouth every 8 (eight) hours as needed for mild pain. ) 50 tablet 0   . meclizine (ANTIVERT) 25 MG tablet Take 0.5-1 tablets (12.5-25 mg total) by mouth 3 (three) times daily as needed. (Patient taking differently: Take 25 mg by mouth 2 (two) times daily as needed for dizziness. ) 30 tablet 1   . nicotine (NICODERM CQ - DOSED IN  MG/24 HOURS) 21 mg/24hr patch Place 1 patch (21 mg total) onto the skin daily. 28 patch 3   . oxyCODONE-acetaminophen (PERCOCET/ROXICET) 5-325 MG per tablet Take 1-2 tablets by mouth every 6 (six) hours as needed for severe pain (moderate to severe pain (when tolerating fluids)). 60 tablet 0   . triamterene-hydrochlorothiazide (MAXZIDE-25) 37.5-25 MG per tablet Take 1 tablet by mouth daily. 90 tablet 3 07/11/2015 at Unknown time    Review of Systems  Constitutional: Negative for fever and chills.  Gastrointestinal: Positive for nausea and abdominal pain. Negative for vomiting, diarrhea and constipation.  Genitourinary: Negative for dysuria and urgency.  Neurological: Negative for headaches.   Physical Exam   Blood pressure 123/88, pulse 87, temperature 98.8 F (37.1 C), resp. rate 18, height 5\' 4"  (1.626 m), weight 195 lb (88.451 kg), SpO2 98 %. (no nurses note found) Physical Exam  Vitals reviewed. Constitutional: She is oriented to person, place, and time. She appears well-developed and well-nourished. No distress.  HENT:  Head: Normocephalic.  Eyes: Pupils are equal, round, and reactive to light.  Neck: Normal range of motion. Neck supple.  Cardiovascular: Normal rate.   Respiratory: Effort normal.  GI: Soft. She exhibits no distension. There is tenderness. There is no rebound.  Genitourinary:  Small amount of opaque pink blood in in vault from cuff- cuff appears intact-   Musculoskeletal: Normal range of motion.  Neurological: She is alert and oriented to person, place, and time.  Skin: Skin is warm and dry.  Psychiatric: She has a normal mood and affect.    MAU Course  Procedures Results for orders placed or performed during the hospital encounter of 07/26/15 (from the past 24 hour(s))  Urinalysis, Routine w reflex microscopic (not at [facility])     Status: Abnormal   Collection Time: 07/26/15 12:00 PM  Result Value Ref Range   Color, Urine YELLOW YELLOW   APPearance CLEAR  CLEAR   Specific Gravity, Urine 1.010 1.005 - 1.030   pH 5.5 5.0 - 8.0   Glucose, UA NEGATIVE NEGATIVE mg/dL   Hgb urine dipstick LARGE (A) NEGATIVE   Bilirubin Urine NEGATIVE NEGATIVE   Ketones, ur NEGATIVE NEGATIVE mg/dL   Protein, ur NEGATIVE NEGATIVE mg/dL   Urobilinogen, UA 0.2 0.0 - 1.0 mg/dL   Nitrite NEGATIVE NEGATIVE   Leukocytes, UA MODERATE (A) NEGATIVE  Urine microscopic-add on     Status: Abnormal   Collection Time: 07/26/15 12:00 PM  Result Value Ref Range   Squamous Epithelial / LPF FEW (A) RARE   WBC, UA 7-10 <3 WBC/hpf   RBC / HPF 7-10 <3 RBC/hpf   Bacteria, UA FEW (A) RARE  CBC  Status: Abnormal   Collection Time: 07/26/15  1:37 PM  Result Value Ref Range   WBC 13.4 (H) 4.0 - 10.5 K/uL   RBC 3.98 3.87 - 5.11 MIL/uL   Hemoglobin 12.9 12.0 - 15.0 g/dL   HCT 36.8 36.0 - 46.0 %   MCV 92.5 78.0 - 100.0 fL   MCH 32.4 26.0 - 34.0 pg   MCHC 35.1 30.0 - 36.0 g/dL   RDW 14.2 11.5 - 15.5 %   Platelets 585 (H) 150 - 400 K/uL  Toradol 60mg  IM given Discussed with Dr. Ihor Dow- pt may have cellulitis- increase in WBC Discussed with pt precautions - no heavy lifting or straining Increase fluids- walk If travel- frequent stops and if fever or increase pain- to go to ED  Assessment and Plan  Post op (LAVH with BSO)bleeding Post op pain-  Continue NSAID and Tylenol ?Cellulitis- 1)Augmentin 875 BID for 5 days                   2) Flagyl 500mg  BID for 5 days Keep clinic appointment as scheduled Return sooner if increase in pain or fever Colace if needed for constipation Mariah Rice 07/26/2015, 1:12 PM

## 2015-07-26 NOTE — MAU Note (Signed)
Pt had a vaginal hysterectomy 2 weeks ago. Woke up yesterday in a "pool of blood" called and told to come in if it happened again. Had bleeding today.  Increased abd pain since yesterday.

## 2015-07-26 NOTE — MAU Note (Signed)
Pt had 2 episodes of bleeding since her lap hysterectomy- the latest one was this AM; c/o constipation also and states that she was late starting her stool softener; she had to do some straining with defection and is wondering if that contributed to the bleeding;

## 2015-07-27 ENCOUNTER — Other Ambulatory Visit: Payer: Self-pay | Admitting: Family Medicine

## 2015-08-02 ENCOUNTER — Ambulatory Visit (INDEPENDENT_AMBULATORY_CARE_PROVIDER_SITE_OTHER): Payer: 59 | Admitting: Obstetrics & Gynecology

## 2015-08-02 ENCOUNTER — Encounter: Payer: Self-pay | Admitting: Obstetrics & Gynecology

## 2015-08-02 VITALS — BP 116/80 | HR 90 | Temp 99.1°F | Resp 18 | Ht 64.0 in | Wt 200.1 lb

## 2015-08-02 DIAGNOSIS — Z90710 Acquired absence of both cervix and uterus: Secondary | ICD-10-CM

## 2015-08-02 DIAGNOSIS — G8918 Other acute postprocedural pain: Secondary | ICD-10-CM

## 2015-08-02 DIAGNOSIS — Z9889 Other specified postprocedural states: Secondary | ICD-10-CM

## 2015-08-02 MED ORDER — OXYCODONE-ACETAMINOPHEN 5-325 MG PO TABS: 1.0000 | ORAL_TABLET | Freq: Four times a day (QID) | ORAL | Status: DC | PRN

## 2015-08-02 NOTE — Progress Notes (Signed)
   CLINIC ENCOUNTER NOTE  History:  44 y.o. [id number] here today for postoperative visit after LAVH, BSO on 07/11/15.  Patient was seen on 07/26/15 in [facility]U for pan and bleeding, and prescribed Augmentin and Flagyl for presumed cuff cellulitis in the absence of fever, but had elevated WBC of 13.  Stable Hgb at 12.9.  She reports occasional pain and wants a refill on her narcotic pain medication as the NSAID does not help for severe pain.  She denies any further abnormal vaginal discharge, bleeding or other concerns.   Past Medical History  Diagnosis Date  . Depression   . Anxiety   . Dental abscess   . Hypertension   . Shortness of breath dyspnea     with anxiety  . Hyperthyroidism 1990s  . GERD (gastroesophageal reflux disease)   . Headache   . PONV (postoperative nausea and vomiting)     Past Surgical History  Procedure Laterality Date  . Tubal ligation  2000  . Incise and drain abcess  2005  . Laparoscopic assisted vaginal hysterectomy N/A 07/11/2015    Procedure: LAPAROSCOPIC ASSISTED VAGINAL HYSTERECTOMY;  Surgeon: Osborne Oman, MD;  Location: [facility];  Service: Gynecology;  Laterality: N/A;  . Laparoscopic bilateral salpingectomy Bilateral 07/11/2015    Procedure: LAPAROSCOPIC BILATERAL SALPINGO OOPHORECTOMY ;  Surgeon: Osborne Oman, MD;  Location: [facility];  Service: Gynecology;  Laterality: Bilateral;    The following portions of the patient's history were reviewed and updated as appropriate: allergies, current medications, past family history, past medical history, past social history, past surgical history and problem list.   Health Maintenance:  Normal pap and negative HRHPV on 04/05/2013.  Review of Systems:  Pertinent items are noted in HPI. Comprehensive review of systems was otherwise negative.  Objective:  Physical Exam BP 116/80 mmHg  Pulse 90  Temp(Src) 99.1 F (37.3 C) (Oral)  Resp 18  Ht 5\' 4"  (1.626 m)  Wt 200 lb 2 oz (90.776 kg)  BMI 34.33 kg/m2  LMP   (LMP Unknown) CONSTITUTIONAL: Well-developed, well-nourished female in no acute distress.  HENT:  Normocephalic, atraumatic. External right and left ear normal. Oropharynx is clear and moist EYES: Conjunctivae and EOM are normal. Pupils are equal, round, and reactive to light. No scleral icterus.  NECK: Normal range of motion, supple, no masses SKIN: Skin is warm and dry. No rash noted. Not diaphoretic. No erythema. No pallor. [place]: Alert and oriented to person, place, and time. Normal reflexes, muscle tone coordination. No cranial nerve deficit noted. PSYCHIATRIC: Normal mood and affect. Normal behavior. Normal judgment and thought content. CARDIOVASCULAR: Normal heart rate noted RESPIRATORY: Effort and breath sounds normal, no problems with respiration noted ABDOMEN: Soft, no distention noted.   PELVIC: Normal appearing external genitalia; normal appearing vaginal mucosa and cuff. Sutures visible, cuff intact. No erythema noted. MUSCULOSKELETAL: Normal range of motion. No edema noted.  Assessment & Plan:  Normal postoperative exam Percocet prescribed as needed Work letter given to patient Routine preventative health maintenance measures emphasized. Please refer to After Visit Summary for other counseling recommendations.    [name], MD, [place] Attending Obstetrician & Gynecologist, [place] for [facility]

## 2015-08-09 ENCOUNTER — Ambulatory Visit (INDEPENDENT_AMBULATORY_CARE_PROVIDER_SITE_OTHER): Payer: 59 | Admitting: Family Medicine

## 2015-08-09 ENCOUNTER — Encounter: Payer: Self-pay | Admitting: Family Medicine

## 2015-08-09 VITALS — BP 138/81 | HR 82 | Temp 98.5°F | Ht 64.0 in | Wt 201.7 lb

## 2015-08-09 DIAGNOSIS — F411 Generalized anxiety disorder: Secondary | ICD-10-CM

## 2015-08-09 DIAGNOSIS — Z90722 Acquired absence of ovaries, bilateral: Secondary | ICD-10-CM | POA: Diagnosis not present

## 2015-08-09 MED ORDER — ESTRADIOL 2 MG PO TABS
2.0000 mg | ORAL_TABLET | Freq: Every day | ORAL | Status: DC
Start: 2015-08-09 — End: 2015-10-19

## 2015-08-09 MED ORDER — CLONAZEPAM 0.5 MG PO TABS: 0.5000 mg | ORAL_TABLET | Freq: Two times a day (BID) | ORAL | Status: DC | PRN

## 2015-08-09 NOTE — Patient Instructions (Signed)

## 2015-08-11 ENCOUNTER — Encounter: Payer: Self-pay | Admitting: Family Medicine

## 2015-08-13 DIAGNOSIS — Z90722 Acquired absence of ovaries, bilateral: Secondary | ICD-10-CM | POA: Insufficient documentation

## 2015-08-13 NOTE — Progress Notes (Signed)
   Subjective:   Dyllan M Mudry is a 44 y.o. female with a history of menorrhagia and depression here for f/u s/p hysterectomy/BSO and anxiety  Patient reports doing better from a post-op standpoint, taking less percocet and pain manageable. Able to walk without any trouble. Bleeding stopped. Going back to work in 2 weeks and somewhat worried about 12 hr shifts.  From an anxiety standpoint she is doing much better. Fear of upcoming surgery and work stress are both circumstances that were working against her. She is only taking klonopin a few times a week and feels that the celexa and abilify are controlling her mood fairly well but still needs the klonopin especially when she goes back to work.  Review of Systems:  Per HPI. All other systems reviewed and are negative.   PMH, PSH, Medications, Allergies, and FmHx reviewed and updated in EMR.  Social History: current smoker  Objective:  BP 138/81 mmHg  Pulse 82  Temp(Src) 98.5 F (36.9 C) (Oral)  Ht 5\' 4"  (1.626 m)  Wt 201 lb 11.2 oz (91.491 kg)  BMI 34.60 kg/m2  LMP  (LMP Unknown)  Gen:  44 y.o. female in NAD HEENT: NCAT, MMM, EOMI, PERRL, anicteric sclerae CV: RRR, no MRG, no JVD Resp: Non-labored, CTAB, no wheezes noted Abd: Soft, ND, mild diffuse tenderness to deep palpation, BS present, no guarding or organomegaly, small well-healed incisions at umbilicus and RLQ. Ext: WWP, no edema MSK: Full ROM, strength intact Neuro: Alert and oriented, speech normal      Chemistry      Component Value Date/Time   NA 138 06/29/2015 1025   K 3.6 06/29/2015 1025   CL 106 06/29/2015 1025   CO2 26 06/29/2015 1025   BUN 10 06/29/2015 1025   CREATININE 1.01* 06/29/2015 1025   CREATININE 0.76 08/16/2014 1613      Component Value Date/Time   CALCIUM 9.1 06/29/2015 1025   ALKPHOS 59 08/16/2014 1613   AST 14 08/16/2014 1613   ALT 14 08/16/2014 1613   BILITOT 0.4 08/16/2014 1613      Lab Results  Component Value Date   WBC 13.4*  07/26/2015   HGB 12.9 07/26/2015   HCT 36.8 07/26/2015   MCV 92.5 07/26/2015   PLT 585* 07/26/2015   Lab Results  Component Value Date   TSH 2.520 03/15/2015   Lab Results  Component Value Date   HGBA1C 5.6 03/15/2015   Assessment:     Catricia M Gubbels is a 43 y.o. female here for f/u hysterectomy and anxiety    Plan:     See problem list for problem-specific plans.  [name], MD PGY-3,  [facility] 08/13/2015  11:33 AM

## 2015-08-13 NOTE — Assessment & Plan Note (Signed)
Anxiety better since hysterectomy, has been out of work, taking klonopin a few times a week only - refill klonopin - continue celexa and abilify

## 2015-08-13 NOTE — Assessment & Plan Note (Signed)
Patient doing well since hysterectomy/oopherectomy, having frequent hot flashes on 1mg  estradiol daily - increase estradiol dose to 2mg  daily

## 2015-09-27 ENCOUNTER — Encounter: Payer: Self-pay | Admitting: Obstetrics and Gynecology

## 2015-09-27 ENCOUNTER — Ambulatory Visit (INDEPENDENT_AMBULATORY_CARE_PROVIDER_SITE_OTHER): Payer: 59 | Admitting: Obstetrics and Gynecology

## 2015-09-27 VITALS — BP 132/83 | HR 90 | Temp 98.5°F | Wt 199.0 lb

## 2015-09-27 DIAGNOSIS — T50Z95A Adverse effect of other vaccines and biological substances, initial encounter: Secondary | ICD-10-CM

## 2015-09-27 DIAGNOSIS — T8069XA Other serum reaction due to other serum, initial encounter: Secondary | ICD-10-CM | POA: Diagnosis not present

## 2015-09-27 NOTE — Progress Notes (Signed)
   Subjective:   Patient ID: Mariah Rice, female    DOB: March 01, 1971, 44 y.o.   MRN: [id number]  Patient presents for Same Day Appointment  Chief Complaint  Patient presents with  . Allergic Reaction    HPI: #Unwell Feeling: -Flu and pneumonia vaccines given yesterday at [place] -patient concerned that she had a reaction to shots -Last night started shaking/chills because was freezing then started burning up -no fevers -has h/o hot flashes s/p hysterectomy and oophorectomy  -Had flu vaccine and pneumonia vaccine before without reaction  -states arm sore, whole body aches, warm feeling  -denies rash, SOB, chest pain, congestion, rhinnorhea  Progression: staying about the same Medications tried: Iburprofen with some relief  Review of Systems   See HPI for ROS.   Past medical history, surgical, family, and social history reviewed and updated in the EMR as appropriate.  Objective:  BP 132/83 mmHg  Pulse 90  Temp(Src) 98.5 F (36.9 C) (Oral)  Wt 199 lb (90.266 kg)  LMP  (LMP Unknown) Vitals and nursing note reviewed  Physical Exam  Constitutional: She is well-developed, well-nourished, and in no distress.  HENT:  Head: Normocephalic and atraumatic.  Eyes: Conjunctivae and EOM are normal. Pupils are equal, round, and reactive to light.  Neck: Normal range of motion. Neck supple.  Cardiovascular: Normal rate and intact distal pulses.   Pulmonary/Chest: Effort normal and breath sounds normal.  Abdominal: Soft. There is no tenderness.  Lymphadenopathy:    She has no cervical adenopathy.  Skin: Skin is warm and dry. No rash noted.    Assessment & Plan:  1. Vaccine reaction, initial encounter Patient with multiple symptoms s/p vaccinations. No concern at this time for any severe anaphylactic reaction. Patient without trouble breathing, no fever, no rash. She is hemodynamically stable and well appearing on exam. Patient encouraged to continue use of ibuprofen or tylenol for  symptom relief. Conservative management at this time. Handout given and return precautions discussed.    Luiz Blare, DO 09/27/2015, 4:12 PM PGY-2, [place]

## 2015-09-27 NOTE — Patient Instructions (Addendum)
   Please return to clinic if you develop fever or worsening symptoms  Symptoms should improve over next couple of days.   You can take over the counter tylenol or ibuprofen for symptom releif  Post-Injection Inflammatory Reaction An inflammatory reaction is possible any time a needle is used to give an injection. It is called a post-injection inflammatory reaction because it happens after the needle is put through the skin. A reaction may start minutes after the injection was given, or the reaction may appear several hours after the injection was given. A reaction can last for several hours to several days. CAUSES  An injection reaction can be caused by different things. Possible causes include:  A reaction to the medicine or vaccine that was given.  An infection that occurs if germs get inside the body at the injection site. SYMPTOMS   Some symptoms may be found only at the injection site (localized reaction). These symptoms may include:  Itching.  Redness.  Warmth.  Swelling.  Tenderness.  Pain.  Some symptoms may show up in other parts of the body (systemic reaction). These symptoms may include:  Fever or chills.  Muscle aches.  Nausea.  Headache.  Dizziness. DIAGNOSIS  To determine if there is a post-injection inflammatory reaction, your caregiver may:  Do a physical exam.  Draw a circle around any redness near the injection site. This will help to show whether the redness is spreading. TREATMENT  Treatment will depend on what caused the reaction. Treatment will also vary based on how severe your reaction is. Common treatment methods include:  Putting an ice pack over the injection site.  Taking anti-inflammatory medicine, to reduce swelling and itching.  Taking an antibiotic.  Taking pain medicine. HOME CARE INSTRUCTIONS   Follow all your caregiver's instructions carefully.  Keep the injection site clean.  You may put ice on the injection  site.  Put ice in a plastic bag.  Place a towel between your skin and the bag.  Leave the ice on for 15-20 minutes, 03-04 times a day.  If the reaction is in a joint, you might need to rest the joint for a while. Ask your caregiver how active you can be.  Only take over-the-counter or prescription medicines for pain, fever, or discomfort as directed by your caregiver. Do not give aspirin to children. SEEK MEDICAL CARE IF:   You have any questions about your medicines.  Your pain, redness, warmth, swelling, or itching lasts for several hours.  You have a fever, chills, or muscle aches. SEEK IMMEDIATE MEDICAL CARE IF:  Your pain, swelling, itching, or redness gets worse.  You have trouble breathing.  Your child has a high-pitched cry or does not stop crying.   This information is not intended to replace advice given to you by your health care provider. Make sure you discuss any questions you have with your health care provider.   Document Released: 08/14/2011 Document Revised: 02/24/2012 Document Reviewed: 06/14/2015 Elsevier Interactive Patient Education [employer].

## 2015-10-19 ENCOUNTER — Encounter: Payer: Self-pay | Admitting: Family Medicine

## 2015-10-19 ENCOUNTER — Ambulatory Visit (INDEPENDENT_AMBULATORY_CARE_PROVIDER_SITE_OTHER): Payer: 59 | Admitting: Family Medicine

## 2015-10-19 VITALS — BP 124/80 | HR 77 | Temp 98.3°F | Ht 64.0 in | Wt 201.0 lb

## 2015-10-19 DIAGNOSIS — M545 Low back pain, unspecified: Secondary | ICD-10-CM

## 2015-10-19 DIAGNOSIS — F329 Major depressive disorder, single episode, unspecified: Secondary | ICD-10-CM

## 2015-10-19 DIAGNOSIS — Z9109 Other allergy status, other than to drugs and biological substances: Secondary | ICD-10-CM

## 2015-10-19 DIAGNOSIS — K219 Gastro-esophageal reflux disease without esophagitis: Secondary | ICD-10-CM

## 2015-10-19 DIAGNOSIS — I10 Essential (primary) hypertension: Secondary | ICD-10-CM

## 2015-10-19 DIAGNOSIS — Z91048 Other nonmedicinal substance allergy status: Secondary | ICD-10-CM

## 2015-10-19 DIAGNOSIS — F32A Depression, unspecified: Secondary | ICD-10-CM

## 2015-10-19 DIAGNOSIS — F411 Generalized anxiety disorder: Secondary | ICD-10-CM

## 2015-10-19 DIAGNOSIS — Z90722 Acquired absence of ovaries, bilateral: Secondary | ICD-10-CM

## 2015-10-19 MED ORDER — ARIPIPRAZOLE 10 MG PO TABS: 10.0000 mg | ORAL_TABLET | Freq: Every day | ORAL | Status: DC

## 2015-10-19 MED ORDER — TRIAMTERENE-HCTZ 37.5-25 MG PO TABS: 1.0000 | ORAL_TABLET | Freq: Every day | ORAL | Status: DC

## 2015-10-19 MED ORDER — OMEPRAZOLE 40 MG PO CPDR: 40.0000 mg | DELAYED_RELEASE_CAPSULE | Freq: Every day | ORAL | Status: DC

## 2015-10-19 MED ORDER — CITALOPRAM HYDROBROMIDE 40 MG PO TABS: 40.0000 mg | ORAL_TABLET | Freq: Every day | ORAL | Status: DC

## 2015-10-19 MED ORDER — ESTRADIOL 2 MG PO TABS: 2.0000 mg | ORAL_TABLET | Freq: Every day | ORAL | Status: DC

## 2015-10-19 MED ORDER — IBUPROFEN 600 MG PO TABS: 600.0000 mg | ORAL_TABLET | Freq: Four times a day (QID) | ORAL | Status: DC | PRN

## 2015-10-19 MED ORDER — FLUTICASONE PROPIONATE 50 MCG/ACT NA SUSP: 2.0000 | Freq: Every day | NASAL | Status: DC

## 2015-10-19 MED ORDER — CLONAZEPAM 0.5 MG PO TABS: 0.5000 mg | ORAL_TABLET | Freq: Two times a day (BID) | ORAL | Status: DC | PRN

## 2015-10-19 NOTE — Assessment & Plan Note (Signed)
Well control on current regimen - continue triamterene-hctz - f/u in 6 months

## 2015-10-19 NOTE — Progress Notes (Signed)
   Subjective:   Mariah Rice is a 44 y.o. female with a history of depression, HTN, recent hysterectomy for menorrhagia here for f/u  Patient reports doing well, well healed, no longer needing percocet or stool softeners. Very pleased to not have the menorrhagia any more. Her hot flashes are much improved on increased estrogen dose.  CHRONIC HYPERTENSION  Disease Monitoring  Blood pressure range: 120-130/80s  Chest pain: no   Dyspnea: no   Claudication: no   Medication compliance: yes  Medication Side Effects  Lightheadedness: no   Urinary frequency: yes but tolerable  Edema: no   Impotence: no    Review of Systems:  Per HPI. All other systems reviewed and are negative.   PMH, PSH, Medications, Allergies, and FmHx reviewed and updated in EMR.  Social History: current smoker  Objective:  BP 124/80 mmHg  Pulse 77  Temp(Src) 98.3 F (36.8 C) (Oral)  Ht 5\' 4"  (1.626 m)  Wt 201 lb (91.173 kg)  BMI 34.48 kg/m2  LMP  (LMP Unknown)  Gen:  44 y.o. female in NAD HEENT: NCAT, MMM, EOMI, PERRL, anicteric sclerae CV: RRR, no MRG, no JVD Resp: Non-labored, CTAB, no wheezes noted Abd: Soft, NTND, BS present, no guarding or organomegaly, well healed scars in lower quadrants and umbilicus Ext: WWP, no edema MSK: Full ROM, strength intact Neuro: Alert and oriented, speech normal      Chemistry      Component Value Date/Time   NA 138 06/29/2015 1025   K 3.6 06/29/2015 1025   CL 106 06/29/2015 1025   CO2 26 06/29/2015 1025   BUN 10 06/29/2015 1025   CREATININE 1.01* 06/29/2015 1025   CREATININE 0.76 08/16/2014 1613      Component Value Date/Time   CALCIUM 9.1 06/29/2015 1025   ALKPHOS 59 08/16/2014 1613   AST 14 08/16/2014 1613   ALT 14 08/16/2014 1613   BILITOT 0.4 08/16/2014 1613      Lab Results  Component Value Date   WBC 13.4* 07/26/2015   HGB 12.9 07/26/2015   HCT 36.8 07/26/2015   MCV 92.5 07/26/2015   PLT 585* 07/26/2015   Lab Results  Component  Value Date   TSH 2.520 03/15/2015   Lab Results  Component Value Date   HGBA1C 5.6 03/15/2015   Assessment & Plan:     Mariah Rice is a 44 y.o. female here for f/u  Hypertension Well control on current regimen - continue triamterene-hctz - f/u in 6 months  GERD (gastroesophageal reflux disease) Persistent heartburn, worse since hysterectomy, has been taking OTC PPI with good relief - omeprazole trial - caution with chronic use given early surgical menopause  Back pain Mild and occasional, well controlled on current therapy - continue ibuprofen prn - may need to back off on this if reflux persists  Depression Well controlled, mood and affect good today - continue current celexa and abilify, klonopin prn for occasional use   Beverlyn Roux, MD, MPH [facility] PGY-3 10/19/2015 9:12 AM

## 2015-10-19 NOTE — Assessment & Plan Note (Signed)
Persistent heartburn, worse since hysterectomy, has been taking OTC PPI with good relief - omeprazole trial - caution with chronic use given early surgical menopause

## 2015-10-19 NOTE — Assessment & Plan Note (Signed)
Mild and occasional, well controlled on current therapy - continue ibuprofen prn - may need to back off on this if reflux persists

## 2015-10-19 NOTE — Assessment & Plan Note (Signed)
Well controlled, mood and affect good today - continue current celexa and abilify, klonopin prn for occasional use

## 2015-10-19 NOTE — Patient Instructions (Signed)
Health Maintenance, Female Adopting a healthy lifestyle and getting preventive care can go a long way to promote health and wellness. Talk with your health care provider about what schedule of regular examinations is right for you. This is a good chance for you to check in with your provider about disease prevention and staying healthy. In between checkups, there are plenty of things you can do on your own. Experts have done a lot of research about which lifestyle changes and preventive measures are most likely to keep you healthy. Ask your health care provider for more information. WEIGHT AND DIET  Eat a healthy diet  Be sure to include plenty of vegetables, fruits, low-fat dairy products, and lean protein.  Do not eat a lot of foods high in solid fats, added sugars, or salt.  Get regular exercise. This is one of the most important things you can do for your health.  Most adults should exercise for at least 150 minutes each week. The exercise should increase your heart rate and make you sweat (moderate-intensity exercise).  Most adults should also do strengthening exercises at least twice a week. This is in addition to the moderate-intensity exercise.  Maintain a healthy weight  Body mass index (BMI) is a measurement that can be used to identify possible weight problems. It estimates body fat based on height and weight. Your health care provider can help determine your BMI and help you achieve or maintain a healthy weight.  For females 20 years of age and older:   A BMI below 18.5 is considered underweight.  A BMI of 18.5 to 24.9 is normal.  A BMI of 25 to 29.9 is considered overweight.  A BMI of 30 and above is considered obese.  Watch levels of cholesterol and blood lipids  You should start having your blood tested for lipids and cholesterol at 42 years of age, then have this test every 5 years.  You may need to have your cholesterol levels checked more often if:  Your lipid  or cholesterol levels are high.  You are older than 42 years of age.  You are at high risk for heart disease.  CANCER SCREENING   Lung Cancer  Lung cancer screening is recommended for adults 55-80 years old who are at high risk for lung cancer because of a history of smoking.  A yearly low-dose CT scan of the lungs is recommended for people who:  Currently smoke.  Have quit within the past 15 years.  Have at least a 30-pack-year history of smoking. A pack year is smoking an average of one pack of cigarettes a day for 1 year.  Yearly screening should continue until it has been 15 years since you quit.  Yearly screening should stop if you develop a health problem that would prevent you from having lung cancer treatment.  Breast Cancer  Practice breast self-awareness. This means understanding how your breasts normally appear and feel.  It also means doing regular breast self-exams. Let your health care provider know about any changes, no matter how small.  If you are in your 20s or 30s, you should have a clinical breast exam (CBE) by a health care provider every 1-3 years as part of a regular health exam.  If you are 40 or older, have a CBE every year. Also consider having a breast X-ray (mammogram) every year.  If you have a family history of breast cancer, talk to your health care provider about genetic screening.  If you   are at high risk for breast cancer, talk to your health care provider about having an MRI and a mammogram every year.  Breast cancer gene (BRCA) assessment is recommended for women who have family members with BRCA-related cancers. BRCA-related cancers include:  Breast.  Ovarian.  Tubal.  Peritoneal cancers.  Results of the assessment will determine the need for genetic counseling and BRCA1 and BRCA2 testing. Cervical Cancer Your health care provider may recommend that you be screened regularly for cancer of the pelvic organs (ovaries, uterus, and  vagina). This screening involves a pelvic examination, including checking for microscopic changes to the surface of your cervix (Pap test). You may be encouraged to have this screening done every 3 years, beginning at age 21.  For women ages 30-65, health care providers may recommend pelvic exams and Pap testing every 3 years, or they may recommend the Pap and pelvic exam, combined with testing for human papilloma virus (HPV), every 5 years. Some types of HPV increase your risk of cervical cancer. Testing for HPV may also be done on women of any age with unclear Pap test results.  Other health care providers may not recommend any screening for nonpregnant women who are considered low risk for pelvic cancer and who do not have symptoms. Ask your health care provider if a screening pelvic exam is right for you.  If you have had past treatment for cervical cancer or a condition that could lead to cancer, you need Pap tests and screening for cancer for at least 20 years after your treatment. If Pap tests have been discontinued, your risk factors (such as having a new sexual partner) need to be reassessed to determine if screening should resume. Some women have medical problems that increase the chance of getting cervical cancer. In these cases, your health care provider may recommend more frequent screening and Pap tests. Colorectal Cancer  This type of cancer can be detected and often prevented.  Routine colorectal cancer screening usually begins at 42 years of age and continues through 42 years of age.  Your health care provider may recommend screening at an earlier age if you have risk factors for colon cancer.  Your health care provider may also recommend using home test kits to check for hidden blood in the stool.  A small camera at the end of a tube can be used to examine your colon directly (sigmoidoscopy or colonoscopy). This is done to check for the earliest forms of colorectal  cancer.  Routine screening usually begins at age 50.  Direct examination of the colon should be repeated every 5-10 years through 42 years of age. However, you may need to be screened more often if early forms of precancerous polyps or small growths are found. Skin Cancer  Check your skin from head to toe regularly.  Tell your health care provider about any new moles or changes in moles, especially if there is a change in a mole's shape or color.  Also tell your health care provider if you have a mole that is larger than the size of a pencil eraser.  Always use sunscreen. Apply sunscreen liberally and repeatedly throughout the day.  Protect yourself by wearing long sleeves, pants, a wide-brimmed hat, and sunglasses whenever you are outside. HEART DISEASE, DIABETES, AND HIGH BLOOD PRESSURE   High blood pressure causes heart disease and increases the risk of stroke. High blood pressure is more likely to develop in:  People who have blood pressure in the high end   of the normal range (130-139/85-89 mm Hg).  People who are overweight or obese.  People who are African American.  If you are 38-23 years of age, have your blood pressure checked every 3-5 years. If you are 61 years of age or older, have your blood pressure checked every year. You should have your blood pressure measured twice--once when you are at a hospital or clinic, and once when you are not at a hospital or clinic. Record the average of the two measurements. To check your blood pressure when you are not at a hospital or clinic, you can use:  An automated blood pressure machine at a pharmacy.  A home blood pressure monitor.  If you are between 45 years and 39 years old, ask your health care provider if you should take aspirin to prevent strokes.  Have regular diabetes screenings. This involves taking a blood sample to check your fasting blood sugar level.  If you are at a normal weight and have a low risk for diabetes,  have this test once every three years after 42 years of age.  If you are overweight and have a high risk for diabetes, consider being tested at a younger age or more often. PREVENTING INFECTION  Hepatitis B  If you have a higher risk for hepatitis B, you should be screened for this virus. You are considered at high risk for hepatitis B if:  You were born in a country where hepatitis B is common. Ask your health care provider which countries are considered high risk.  Your parents were born in a high-risk country, and you have not been immunized against hepatitis B (hepatitis B vaccine).  You have HIV or AIDS.  You use needles to inject street drugs.  You live with someone who has hepatitis B.  You have had sex with someone who has hepatitis B.  You get hemodialysis treatment.  You take certain medicines for conditions, including cancer, organ transplantation, and autoimmune conditions. Hepatitis C  Blood testing is recommended for:  Everyone born from [birth year] through 1963.  Anyone with known risk factors for hepatitis C. Sexually transmitted infections (STIs)  You should be screened for sexually transmitted infections (STIs) including gonorrhea and chlamydia if:  You are sexually active and are younger than 42 years of age.  You are older than 42 years of age and your health care provider tells you that you are at risk for this type of infection.  Your sexual activity has changed since you were last screened and you are at an increased risk for chlamydia or gonorrhea. Ask your health care provider if you are at risk.  If you do not have HIV, but are at risk, it may be recommended that you take a prescription medicine daily to prevent HIV infection. This is called pre-exposure prophylaxis (PrEP). You are considered at risk if:  You are sexually active and do not regularly use condoms or know the HIV status of your partner(s).  You take drugs by injection.  You are sexually  active with a partner who has HIV. Talk with your health care provider about whether you are at high risk of being infected with HIV. If you choose to begin PrEP, you should first be tested for HIV. You should then be tested every 3 months for as long as you are taking PrEP.  PREGNANCY   If you are premenopausal and you may become pregnant, ask your health care provider about preconception counseling.  If you may  become pregnant, take 400 to 800 micrograms (mcg) of folic acid every day.  If you want to prevent pregnancy, talk to your health care provider about birth control (contraception). OSTEOPOROSIS AND MENOPAUSE   Osteoporosis is a disease in which the bones lose minerals and strength with aging. This can result in serious bone fractures. Your risk for osteoporosis can be identified using a bone density scan.  If you are 61 years of age or older, or if you are at risk for osteoporosis and fractures, ask your health care provider if you should be screened.  Ask your health care provider whether you should take a calcium or vitamin D supplement to lower your risk for osteoporosis.  Menopause may have certain physical symptoms and risks.  Hormone replacement therapy may reduce some of these symptoms and risks. Talk to your health care provider about whether hormone replacement therapy is right for you.  HOME CARE INSTRUCTIONS   Schedule regular health, dental, and eye exams.  Stay current with your immunizations.   Do not use any tobacco products including cigarettes, chewing tobacco, or electronic cigarettes.  If you are pregnant, do not drink alcohol.  If you are breastfeeding, limit how much and how often you drink alcohol.  Limit alcohol intake to no more than 1 drink per day for nonpregnant women. One drink equals 12 ounces of beer, 5 ounces of wine, or 1 ounces of hard liquor.  Do not use street drugs.  Do not share needles.  Ask your health care provider for help if  you need support or information about quitting drugs.  Tell your health care provider if you often feel depressed.  Tell your health care provider if you have ever been abused or do not feel safe at home.   This information is not intended to replace advice given to you by your health care provider. Make sure you discuss any questions you have with your health care provider.   Document Released: 06/17/2011 Document Revised: 12/23/2014 Document Reviewed: 11/03/2013 Elsevier Interactive Patient Education [employer].

## 2015-12-27 ENCOUNTER — Encounter: Payer: Self-pay | Admitting: Family Medicine

## 2015-12-27 ENCOUNTER — Ambulatory Visit (INDEPENDENT_AMBULATORY_CARE_PROVIDER_SITE_OTHER): Payer: Self-pay | Admitting: Family Medicine

## 2015-12-27 VITALS — BP 131/73 | HR 78 | Temp 97.9°F | Wt 206.0 lb

## 2015-12-27 DIAGNOSIS — Z9109 Other allergy status, other than to drugs and biological substances: Secondary | ICD-10-CM

## 2015-12-27 DIAGNOSIS — F411 Generalized anxiety disorder: Secondary | ICD-10-CM

## 2015-12-27 DIAGNOSIS — Z91048 Other nonmedicinal substance allergy status: Secondary | ICD-10-CM

## 2015-12-27 DIAGNOSIS — I10 Essential (primary) hypertension: Secondary | ICD-10-CM

## 2015-12-27 DIAGNOSIS — M79645 Pain in left finger(s): Secondary | ICD-10-CM | POA: Insufficient documentation

## 2015-12-27 DIAGNOSIS — R21 Rash and other nonspecific skin eruption: Secondary | ICD-10-CM

## 2015-12-27 MED ORDER — FLUTICASONE PROPIONATE 50 MCG/ACT NA SUSP: 2.0000 | Freq: Every day | NASAL | Status: DC

## 2015-12-27 MED ORDER — CLONAZEPAM 0.5 MG PO TABS: 0.5000 mg | ORAL_TABLET | Freq: Two times a day (BID) | ORAL | Status: DC | PRN

## 2015-12-27 MED ORDER — TRIAMTERENE-HCTZ 37.5-25 MG PO TABS: 1.0000 | ORAL_TABLET | Freq: Every day | ORAL | Status: DC

## 2015-12-27 MED ORDER — HYDROCORTISONE 1 % EX OINT: 1.0000 "application " | TOPICAL_OINTMENT | Freq: Two times a day (BID) | CUTANEOUS | Status: DC

## 2015-12-27 NOTE — Patient Instructions (Signed)
Generalized Anxiety Disorder Generalized anxiety disorder (GAD) is a mental disorder. It interferes with life functions, including relationships, work, and school. GAD is different from normal anxiety, which everyone experiences at some point in their lives in response to specific life events and activities. Normal anxiety actually helps us prepare for and get through these life events and activities. Normal anxiety goes away after the event or activity is over.  GAD causes anxiety that is not necessarily related to specific events or activities. It also causes excess anxiety in proportion to specific events or activities. The anxiety associated with GAD is also difficult to control. GAD can vary from mild to severe. People with severe GAD can have intense waves of anxiety with physical symptoms (panic attacks).  SYMPTOMS The anxiety and worry associated with GAD are difficult to control. This anxiety and worry are related to many life events and activities and also occur more days than not for 6 months or longer. People with GAD also have three or more of the following symptoms (one or more in children):  Restlessness.   Fatigue.  Difficulty concentrating.   Irritability.  Muscle tension.  Difficulty sleeping or unsatisfying sleep. DIAGNOSIS GAD is diagnosed through an assessment by your health care provider. Your health care provider will ask you questions aboutyour mood,physical symptoms, and events in your life. Your health care provider may ask you about your medical history and use of alcohol or drugs, including prescription medicines. Your health care provider may also do a physical exam and blood tests. Certain medical conditions and the use of certain substances can cause symptoms similar to those associated with GAD. Your health care provider may refer you to a mental health specialist for further evaluation. TREATMENT The following therapies are usually used to treat GAD:    Medication. Antidepressant medication usually is prescribed for long-term daily control. Antianxiety medicines may be added in severe cases, especially when panic attacks occur.   Talk therapy (psychotherapy). Certain types of talk therapy can be helpful in treating GAD by providing support, education, and guidance. A form of talk therapy called cognitive behavioral therapy can teach you healthy ways to think about and react to daily life events and activities.  Stress managementtechniques. These include yoga, meditation, and exercise and can be very helpful when they are practiced regularly. A mental health specialist can help determine which treatment is best for you. Some people see improvement with one therapy. However, other people require a combination of therapies.   This information is not intended to replace advice given to you by your health care provider. Make sure you discuss any questions you have with your health care provider.   Document Released: 03/29/2013 Document Revised: 12/23/2014 Document Reviewed: 03/29/2013 Elsevier Interactive Patient Education 2016 [employer]  

## 2015-12-29 NOTE — Progress Notes (Signed)
Subjective:   Mariah Rice is a 45 y.o. female with a history of depression and anxiety here for f/u anxiety and several other acute complaints.  Reports she is still having significant anxiety, mostly at work. Klonopin helps but not enough and she thinks she needs a higher dose. She has not been feeling depressed so has only been taking celexa and abilify a few times a week, trying to taper off.   She has a red spot on her chin that has been there for several weeks and was much worse when she was throwing up. It is not painful but itches and is unsightly, she usually covers it with makeup. No rashes elsewhere on her body.  Her left middle finger has been sore for 1-2 weeks. She doesn't remember hurting it. She wears a large ring on that finger most days and the pain is under the ring  Review of Systems:  Per HPI. All other systems reviewed and are negative.   PMH, PSH, Medications, Allergies, and FmHx reviewed and updated in EMR.  Social History: current smoker  Objective:  BP 131/73 mmHg  Pulse 78  Temp(Src) 97.9 F (36.6 C) (Oral)  Wt 206 lb (93.441 kg)  LMP  (LMP Unknown)  Gen:  45 y.o. female in NAD HEENT: NCAT, MMM, EOMI, PERRL, anicteric sclerae CV: RRR, no MRG, no JVD Resp: Non-labored, CTAB, no wheezes noted Abd: Soft, NTND, BS present, no guarding or organomegaly Ext: WWP, no edema MSK: normal ROm left finger, tender to palpation over proximal 3rd phalanx Neuro: Alert and oriented, speech normal Skin: 2x5cm erythematous patch under chin      Chemistry      Component Value Date/Time   NA 138 06/29/2015 1025   K 3.6 06/29/2015 1025   CL 106 06/29/2015 1025   CO2 26 06/29/2015 1025   BUN 10 06/29/2015 1025   CREATININE 1.01* 06/29/2015 1025   CREATININE 0.76 08/16/2014 1613      Component Value Date/Time   CALCIUM 9.1 06/29/2015 1025   ALKPHOS 59 08/16/2014 1613   AST 14 08/16/2014 1613   ALT 14 08/16/2014 1613   BILITOT 0.4 08/16/2014 1613      Lab  Results  Component Value Date   WBC 13.4* 07/26/2015   HGB 12.9 07/26/2015   HCT 36.8 07/26/2015   MCV 92.5 07/26/2015   PLT 585* 07/26/2015   Lab Results  Component Value Date   TSH 2.520 03/15/2015   Lab Results  Component Value Date   HGBA1C 5.6 03/15/2015   Assessment & Plan:     Mariah Rice is a 45 y.o. female here for anxiety  GAD (generalized anxiety disorder) Anxiety still quite severe at work, not taking celexa regularly because she hasn't felt depressed, wants to increase klonopin - counseled that regular celexa use will control anxiety better than klonopin, not just for depression, patient voiced understanding and will resume using this regularly, ok to stop abilify if mood remains stable - continue klonopin at current dose, will taper at next visit when celexa use stabilized  Rash and nonspecific skin eruption Erythema on chin for several weeks, itchy, no pain, worse when throwing up, no rash elsewhere - likely contact dermatitis or possibly eczema - hydrocortisone  - call if not improving  Pain in finger of left hand Sore and tender L 3rd finger near MCP joint, ~1 week, no trauma, normal ROM and exam besides tenderness - likely bruise, rec not wearing ring on that finger,  ice, nsaids prn - call if not improving in 1-2 weeks    Beverlyn Roux, MD, MPH [facility] PGY-3 12/29/2015 10:16 AM

## 2015-12-29 NOTE — Assessment & Plan Note (Signed)
Sore and tender L 3rd finger near MCP joint, ~1 week, no trauma, normal ROM and exam besides tenderness - likely bruise, rec not wearing ring on that finger, ice, nsaids prn - call if not improving in 1-2 weeks

## 2015-12-29 NOTE — Assessment & Plan Note (Signed)
Anxiety still quite severe at work, not taking celexa regularly because she hasn't felt depressed, wants to increase klonopin - counseled that regular celexa use will control anxiety better than klonopin, not just for depression, patient voiced understanding and will resume using this regularly, ok to stop abilify if mood remains stable - continue klonopin at current dose, will taper at next visit when celexa use stabilized

## 2015-12-29 NOTE — Assessment & Plan Note (Signed)
Erythema on chin for several weeks, itchy, no pain, worse when throwing up, no rash elsewhere - likely contact dermatitis or possibly eczema - hydrocortisone  - call if not improving

## 2016-01-02 ENCOUNTER — Emergency Department (HOSPITAL_COMMUNITY): Payer: Self-pay

## 2016-01-02 ENCOUNTER — Encounter (HOSPITAL_COMMUNITY): Payer: Self-pay | Admitting: Emergency Medicine

## 2016-01-02 ENCOUNTER — Emergency Department (HOSPITAL_COMMUNITY)
Admission: EM | Admit: 2016-01-02 | Discharge: 2016-01-02 | Disposition: A | Discharge status: Home / Self Care | Payer: Self-pay | Attending: Physician Assistant | Admitting: Physician Assistant

## 2016-01-02 DIAGNOSIS — F419 Anxiety disorder, unspecified: Secondary | ICD-10-CM | POA: Insufficient documentation

## 2016-01-02 DIAGNOSIS — Z7951 Long term (current) use of inhaled steroids: Secondary | ICD-10-CM | POA: Insufficient documentation

## 2016-01-02 DIAGNOSIS — F329 Major depressive disorder, single episode, unspecified: Secondary | ICD-10-CM | POA: Insufficient documentation

## 2016-01-02 DIAGNOSIS — Z79899 Other long term (current) drug therapy: Secondary | ICD-10-CM | POA: Insufficient documentation

## 2016-01-02 DIAGNOSIS — F17210 Nicotine dependence, cigarettes, uncomplicated: Secondary | ICD-10-CM | POA: Insufficient documentation

## 2016-01-02 DIAGNOSIS — K219 Gastro-esophageal reflux disease without esophagitis: Secondary | ICD-10-CM | POA: Insufficient documentation

## 2016-01-02 DIAGNOSIS — J111 Influenza due to unidentified influenza virus with other respiratory manifestations: Secondary | ICD-10-CM | POA: Insufficient documentation

## 2016-01-02 DIAGNOSIS — I10 Essential (primary) hypertension: Secondary | ICD-10-CM | POA: Insufficient documentation

## 2016-01-02 DIAGNOSIS — Z8639 Personal history of other endocrine, nutritional and metabolic disease: Secondary | ICD-10-CM | POA: Insufficient documentation

## 2016-01-02 DIAGNOSIS — Z880 Allergy status to penicillin: Secondary | ICD-10-CM | POA: Insufficient documentation

## 2016-01-02 DIAGNOSIS — Z79818 Long term (current) use of other agents affecting estrogen receptors and estrogen levels: Secondary | ICD-10-CM | POA: Insufficient documentation

## 2016-01-02 LAB — RAPID STREP SCREEN (MED CTR MEBANE ONLY): Streptococcus, Group A Screen (Direct): NEGATIVE

## 2016-01-02 MED ORDER — GUAIFENESIN 100 MG/5ML PO LIQD: 100.0000 mg | ORAL | Status: DC | PRN

## 2016-01-02 MED ORDER — GUAIFENESIN 100 MG/5ML PO SOLN
5.0000 mL | Freq: Once | ORAL | Status: AC
  Administered 2016-01-02: 100 mg via ORAL
  Filled 2016-01-02: qty 5

## 2016-01-02 MED ORDER — IBUPROFEN 800 MG PO TABS: 800.0000 mg | ORAL_TABLET | Freq: Four times a day (QID) | ORAL | Status: DC | PRN

## 2016-01-02 MED ORDER — BENZONATATE 100 MG PO CAPS: 100.0000 mg | ORAL_CAPSULE | Freq: Three times a day (TID) | ORAL | Status: DC | PRN

## 2016-01-02 MED ORDER — IBUPROFEN 800 MG PO TABS
800.0000 mg | ORAL_TABLET | Freq: Once | ORAL | Status: AC
  Administered 2016-01-02: 800 mg via ORAL
  Filled 2016-01-02: qty 1

## 2016-01-02 NOTE — Discharge Instructions (Signed)

## 2016-01-02 NOTE — ED Provider Notes (Signed)
CSN: [id number]     Arrival date & time 01/02/16  [id number] History   First MD Initiated Contact with Patient 01/02/16 [phone]     Chief Complaint  Patient presents with  . Cough  . Sore Throat     (Consider location/radiation/quality/duration/timing/severity/associated sxs/prior Treatment) HPI   She is a 45 year old female with history of depression, anxiety, GERD presenting today with several days of sore throat, upper respiratory symptoms, myalgias. Patient says she's tried over-the-counter remedies that have not helped. Patient reports cough is nonproductive. She reports she aches all over. She is unsure whether she had a fever or not.  Patient's been eating and drinking normally. No nausea vomiting diarrhea. Patient reports multiple sick contacts.  Past Medical History  Diagnosis Date  . Depression   . Anxiety   . Dental abscess   . Hypertension   . Shortness of breath dyspnea     with anxiety  . Hyperthyroidism 1990s  . GERD (gastroesophageal reflux disease)   . Headache   . PONV (postoperative nausea and vomiting)    Past Surgical History  Procedure Laterality Date  . Tubal ligation  2000  . Incise and drain abcess  2005  . Laparoscopic assisted vaginal hysterectomy N/A 07/11/2015    Procedure: LAPAROSCOPIC ASSISTED VAGINAL HYSTERECTOMY;  Surgeon: Osborne Oman, MD;  Location: [facility];  Service: Gynecology;  Laterality: N/A;  . Laparoscopic bilateral salpingectomy Bilateral 07/11/2015    Procedure: LAPAROSCOPIC BILATERAL SALPINGO OOPHORECTOMY ;  Surgeon: Osborne Oman, MD;  Location: [facility];  Service: Gynecology;  Laterality: Bilateral;   Family History  Problem Relation Age of Onset  . Cancer Mother     cervical cancer  . Heart disease Father   . Hypertension Father   . COPD Father   . Hypertension Paternal Aunt   . Thyroid disease Maternal Grandmother   . Hypertension Paternal Grandmother   . Heart disease Paternal Grandmother    Social History  Substance Use  Topics  . Smoking status: Current Every Day Smoker -- 0.50 packs/day    Types: Cigarettes  . Smokeless tobacco: None  . Alcohol Use: 0.0 oz/week    0 Standard drinks or equivalent per week     Comment: occasional   OB History    Gravida Para Term Preterm AB TAB SAB Ectopic Multiple Living   4 2 2  2  2   2      Review of Systems  Constitutional: Positive for chills and fatigue. Negative for activity change.  HENT: Positive for congestion, ear pain, rhinorrhea and sore throat. Negative for facial swelling.   Eyes: Negative for discharge.  Respiratory: Positive for cough. Negative for chest tightness.   Cardiovascular: Negative for chest pain.  Gastrointestinal: Negative for abdominal distention.  Genitourinary: Negative for dysuria.  Skin: Negative for rash.  Allergic/Immunologic: Negative for immunocompromised state.      Allergies  Penicillins and Sulfa antibiotics  Home Medications   Prior to Admission medications   Medication Sig Start Date End Date Taking? Authorizing Provider  ARIPiprazole (ABILIFY) 10 MG tablet Take 1 tablet (10 mg total) by mouth daily. 10/19/15  Yes [name], MD  citalopram (CELEXA) 40 MG tablet Take 1 tablet (40 mg total) by mouth daily. 10/19/15  Yes [name], MD  clonazePAM (KLONOPIN) 0.5 MG tablet Take 1 tablet (0.5 mg total) by mouth 2 (two) times daily as needed for anxiety. 12/27/15  Yes [name], MD  estradiol (ESTRACE) 2 MG tablet Take 1  tablet (2 mg total) by mouth daily. 10/19/15  Yes [name], MD  fluticasone (FLONASE) 50 MCG/ACT nasal spray Place 2 sprays into both nostrils daily. 12/27/15  Yes [name], MD  triamterene-hydrochlorothiazide (MAXZIDE-25) 37.5-25 MG tablet Take 1 tablet by mouth daily. 12/27/15  Yes [name], MD  hydrocortisone 1 % ointment Apply 1 application topically 2 (two) times daily. Patient not taking: Reported on 01/02/2016 12/27/15   [name], MD  ibuprofen (ADVIL,MOTRIN) 600 MG tablet  Take 1 tablet (600 mg total) by mouth every 6 (six) hours as needed (mild pain). Patient not taking: Reported on 01/02/2016 10/19/15   [name], MD  omeprazole (PRILOSEC) 40 MG capsule Take 1 capsule (40 mg total) by mouth daily. Patient not taking: Reported on 01/02/2016 10/19/15   [name], MD   BP 125/90 mmHg  Pulse 82  Temp(Src) 98.2 F (36.8 C) (Oral)  Resp 16  SpO2 98%  LMP  (LMP Unknown) Physical Exam  Constitutional: She is oriented to person, place, and time. She appears well-developed and well-nourished.  HENT:  Head: Normocephalic and atraumatic.  Right Ear: External ear normal.  Left Ear: External ear normal.  Bilateral TMs no infection. No lymphadenopathy. Very mild erythematous posterior pharynx  Eyes: Conjunctivae are normal. Right eye exhibits no discharge.  Neck: Neck supple.  Cardiovascular: Normal rate, regular rhythm and normal heart sounds.   No murmur heard. Pulmonary/Chest: Effort normal and breath sounds normal. She has no wheezes. She has no rales.  Musculoskeletal: Normal range of motion. She exhibits no edema.  Neurological: She is oriented to person, place, and time. No cranial nerve deficit.  Skin: Skin is warm and dry. No rash noted. She is not diaphoretic.  Psychiatric: She has a normal mood and affect. Her behavior is normal.  Nursing note and vitals reviewed.   ED Course  Procedures (including critical care time) Labs Review Labs Reviewed  RAPID STREP SCREEN (NOT AT [facility])  CULTURE, GROUP A STREP [facility])    Imaging Review Dg Chest 2 View  01/02/2016  CLINICAL DATA:  History of depression, chest pain EXAM: CHEST  2 VIEW COMPARISON:  CT chest 09/22/2013 FINDINGS: The heart size and mediastinal contours are within normal limits. Both lungs are clear. The visualized skeletal structures are unremarkable. IMPRESSION: No active cardiopulmonary disease. Electronically Signed   By: Kathreen Devoid   On: 01/02/2016 10:18   I have personally reviewed  and evaluated these images and lab results as part of my medical decision-making.   EKG Interpretation None      MDM   Final diagnoses:  None   patient is a 45 year old female presenting with several days of upper respiratory symptoms myalgias and fatigue. Patient's had multiple sick contacts. Patient has no focal infection on exam. Chest is clear. TMs clear posterior pharynx only minimal erythema. We'll x-ray to make sure the patient does not have pneumonia and wait for rapid strep. If both negative plan to discharge with symptomatic care.  The patient appears reasonably screen and/or stabilized for discharge and I doubt any other medical condition that requires further screening, evaluation, or treatment in the ED at this time prior to discharge.     Jazzlin Clements Julio Alm, MD 01/02/16 1039

## 2016-01-02 NOTE — ED Notes (Signed)
Pt reports productive cough since Thursday. Cough preceded by sore throat. Pt also reports generalized body aches

## 2016-01-04 LAB — CULTURE, GROUP A STREP (THRC)

## 2016-02-23 ENCOUNTER — Telehealth: Payer: Self-pay | Admitting: Family Medicine

## 2016-02-23 NOTE — Telephone Encounter (Signed)
She needs evaluation. It is not appropriate to call in this med without seeing her. Thanks!

## 2016-02-23 NOTE — Telephone Encounter (Signed)
Pt is requesting a prescription of Tamiflu called in for herself. She feels like she has the flu. jw

## 2016-02-27 ENCOUNTER — Encounter: Payer: Self-pay | Admitting: Family Medicine

## 2016-02-27 ENCOUNTER — Ambulatory Visit (INDEPENDENT_AMBULATORY_CARE_PROVIDER_SITE_OTHER): Payer: BLUE CROSS/BLUE SHIELD | Admitting: Family Medicine

## 2016-02-27 VITALS — BP 132/87 | HR 68 | Temp 98.7°F | Wt 196.4 lb

## 2016-02-27 DIAGNOSIS — J111 Influenza due to unidentified influenza virus with other respiratory manifestations: Secondary | ICD-10-CM | POA: Diagnosis not present

## 2016-02-27 DIAGNOSIS — R69 Illness, unspecified: Principal | ICD-10-CM

## 2016-02-27 NOTE — Progress Notes (Signed)
Subjective: Mariah Rice is a 45 y.o. female presenting for flu-like symptoms.  Woke up 3 days ago with malaise, fatigue, body aches associated with runny nose, congestion, sore throat and nonproductive cough. No wheezing. Usually smokes 1/2 ppd, but has been smoking much less. Tylenol, ibuprofen tried with little improvement. DayQuil and NyQuil unhelpful. Staying hydrated. No N/V/D, chest pain, dyspnea, palpitations, leg swelling. Hasn't check temperature but has felt very warm and had chills. 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 Got flu shot and PNA shot.   - ROS: As above - 1/2 ppd smoker; precontemplative  Objective: BP 132/87 mmHg  Pulse 68  Temp(Src) 98.7 F (37.1 C) (Oral)  Wt 196 lb 6.4 oz (89.086 kg)  LMP  (LMP Unknown) Gen: Tired-appearing 45 y.o. female in no distress HEENT: Normocephalic, sclerae/conjunctivae normal, TMs normal, mild eczema in right canal, pupils equal and reactive, nares normal, moist mucous membranes, posterior oropharynx clear, fair dentition Pulm: Non-labored breathing ambient air; CTAB, no wheezes or crackles CV: Regular rate, no murmur; radial, DP and PT pulses 2+ bilaterally; no LE edema, no JVD, cap refill < 2 sec.  Assessment/Plan: Mariah Rice is a 45 y.o. female here for influenza-like illness. - No red flags indicating need for CBC, CXR. Not at high risk of complications. Outside tamiflu window of benefit. - Reviewed supportive measures, expected duration, limited benefit of medications, and return precautions. - To return every Fall for influenza vaccination, and as needed to discuss tobacco cessation. - Stay out of work for 48 hrs

## 2016-02-27 NOTE — Patient Instructions (Signed)
The flu is caused by a virus.  The good news is it goes away on its own and the bad news is we don't have much of a treatment to make it go away faster. Antibiotics are useless against viruses.  What to do:  - Drink adequate fluids.  - Use nasal saline spray as needed to help clear runny nose. - Take warm showers and drink hot tea. The humid air will help with runny nose and congestion. - Take 1 tbsp of honey every 2 hours to help with cough and sore throat. - You can use tylenol alternating with motrin every 3 hours for fever with pain.  - All members in the household should wash their hands frequently. Cold-causing viruses can stay on hands for 2 days or more.  - Minimize your exposure to smoke from cigarettes, etc.  Timeline:  - Usually persists for 3 to 7 days in the most people but it can take 2-3 weeks for cough to completely go away.  - If you start getting worse after initial improvement or if this is not going away in the next 10 days you should make another appointment. If you have difficulty breathing you should present to the emergency room right away.

## 2016-07-23 DIAGNOSIS — H9201 Otalgia, right ear: Secondary | ICD-10-CM | POA: Insufficient documentation

## 2016-08-19 ENCOUNTER — Ambulatory Visit (HOSPITAL_COMMUNITY)
Admission: EM | Admit: 2016-08-19 | Discharge: 2016-08-19 | Disposition: A | Discharge status: Home / Self Care | Payer: Self-pay | Attending: Radiology | Admitting: Radiology

## 2016-08-19 ENCOUNTER — Encounter (HOSPITAL_COMMUNITY): Payer: Self-pay | Admitting: Emergency Medicine

## 2016-08-19 DIAGNOSIS — K1379 Other lesions of oral mucosa: Secondary | ICD-10-CM

## 2016-08-19 MED ORDER — MAGIC MOUTHWASH W/LIDOCAINE: 5.0000 mL | Freq: Three times a day (TID) | ORAL | 0 refills | Status: DC

## 2016-08-19 NOTE — ED Triage Notes (Signed)
The patient presented to the [facility] with a complaint of a burning sensation in her mouth for 4 days.

## 2016-08-19 NOTE — ED Provider Notes (Signed)
CSN: [id number]     Arrival date & time 08/19/16  1416 History   First MD Initiated Contact with Patient 08/19/16 1529     Chief Complaint  Patient presents with  . Dental Pain   (Consider location/radiation/quality/duration/timing/severity/associated sxs/prior Treatment) 45 y.o. rfemale presents with a burning sensation that is persistent in nature to her tongue and lower gumline x 3 days. Condition is acute in nature. Condition is made better by nothing. Condition is made worse by eating and drinking. Patient denies any relief from Anbesol  taken prior to there arrival at this facility. Patient denies and SHOB, ulcerations, allergic reactions or truama to her mouth.       Past Medical History:  Diagnosis Date  . Anxiety   . Dental abscess   . Depression   . GERD (gastroesophageal reflux disease)   . Headache   . Hypertension   . Hyperthyroidism 1990s  . PONV (postoperative nausea and vomiting)   . Shortness of breath dyspnea    with anxiety   Past Surgical History:  Procedure Laterality Date  . INCISE AND DRAIN ABCESS  2005  . LAPAROSCOPIC ASSISTED VAGINAL HYSTERECTOMY N/A 07/11/2015   Procedure: LAPAROSCOPIC ASSISTED VAGINAL HYSTERECTOMY;  Surgeon: Osborne Oman, MD;  Location: [facility];  Service: Gynecology;  Laterality: N/A;  . LAPAROSCOPIC BILATERAL SALPINGECTOMY Bilateral 07/11/2015   Procedure: LAPAROSCOPIC BILATERAL SALPINGO OOPHORECTOMY ;  Surgeon: Osborne Oman, MD;  Location: [facility];  Service: Gynecology;  Laterality: Bilateral;  . tubal ligation  2000   Family History  Problem Relation Age of Onset  . Cancer Mother     cervical cancer  . Heart disease Father   . Hypertension Father   . COPD Father   . Hypertension Paternal Aunt   . Thyroid disease Maternal Grandmother   . Hypertension Paternal Grandmother   . Heart disease Paternal Grandmother    Social History  Substance Use Topics  . Smoking status: Current Every Day Smoker    Packs/day: 0.50   Types: Cigarettes  . Smokeless tobacco: Not on file  . Alcohol use 0.0 oz/week     Comment: occasional   OB History    Gravida Para Term Preterm AB Living   4 2 2   2 2    SAB TAB Ectopic Multiple Live Births   2             Review of Systems  Constitutional: Negative.   HENT:       Burning sensation to tongue and lower gumline  Respiratory: Negative.   Cardiovascular: Negative.     Allergies  Penicillins and Sulfa antibiotics  Home Medications   Prior to Admission medications   Medication Sig Start Date End Date Taking? Authorizing Provider  ARIPiprazole (ABILIFY) 10 MG tablet Take 1 tablet (10 mg total) by mouth daily. 10/19/15   [name], MD  benzonatate (TESSALON PERLES) 100 MG capsule Take 1 capsule (100 mg total) by mouth 3 (three) times daily as needed for cough. 01/02/16   Courteney Lyn Mackuen, MD  citalopram (CELEXA) 40 MG tablet Take 1 tablet (40 mg total) by mouth daily. 10/19/15   [name], MD  clonazePAM (KLONOPIN) 0.5 MG tablet Take 1 tablet (0.5 mg total) by mouth 2 (two) times daily as needed for anxiety. 12/27/15   [name], MD  estradiol (ESTRACE) 2 MG tablet Take 1 tablet (2 mg total) by mouth daily. 10/19/15   [name], MD  fluticasone (FLONASE) 50 MCG/ACT  nasal spray Place 2 sprays into both nostrils daily. 12/27/15   [name], MD  guaiFENesin (ROBITUSSIN) 100 MG/5ML liquid Take 5-10 mLs (100-200 mg total) by mouth every 4 (four) hours as needed for cough. 01/02/16   Courteney Lyn Mackuen, MD  hydrocortisone 1 % ointment Apply 1 application topically 2 (two) times daily. Patient not taking: Reported on 01/02/2016 12/27/15   [name], MD  ibuprofen (ADVIL,MOTRIN) 600 MG tablet Take 1 tablet (600 mg total) by mouth every 6 (six) hours as needed (mild pain). Patient not taking: Reported on 01/02/2016 10/19/15   [name], MD  ibuprofen (ADVIL,MOTRIN) 800 MG tablet Take 1 tablet (800 mg total) by mouth every 6 (six) hours as needed.  01/02/16   Quimby, MD  magic mouthwash w/lidocaine SOLN Take 5 mLs by mouth 3 (three) times daily. 08/19/16   Jacqualine Mau, NP  omeprazole (PRILOSEC) 40 MG capsule Take 1 capsule (40 mg total) by mouth daily. Patient not taking: Reported on 01/02/2016 10/19/15   [name], MD  triamterene-hydrochlorothiazide (MAXZIDE-25) 37.5-25 MG tablet Take 1 tablet by mouth daily. 12/27/15   [name], MD   Meds Ordered and Administered this Visit  Medications - No data to display  BP 141/83 (BP Location: Left Arm)   Pulse 81   Temp 98.2 F (36.8 C) (Oral)   Resp 14   LMP  (LMP Unknown) Comment: on Megace  SpO2 99%  No data found.   Physical Exam  Constitutional: She appears well-nourished.  HENT:  Head: Atraumatic.  Eyes: Conjunctivae are normal.  Neck: Normal range of motion. Neck supple.  Cardiovascular: Normal rate and regular rhythm.   Pulmonary/Chest: Effort normal and breath sounds normal.    Urgent Care Course   Clinical Course    Procedures (including critical care time)  Labs Review Labs Reviewed - No data to display  Imaging Review No results found.   Visual Acuity Review  Right Eye Distance:   Left Eye Distance:   Bilateral Distance:    Right Eye Near:   Left Eye Near:    Bilateral Near:         MDM   1. Mouth pain       Jacqualine Mau, NP 08/19/16 1600

## 2016-08-20 ENCOUNTER — Ambulatory Visit (INDEPENDENT_AMBULATORY_CARE_PROVIDER_SITE_OTHER): Payer: Self-pay | Admitting: Family Medicine

## 2016-08-20 ENCOUNTER — Encounter: Payer: Self-pay | Admitting: Family Medicine

## 2016-08-20 VITALS — BP 140/84 | HR 84 | Temp 98.6°F | Ht 64.0 in | Wt 204.8 lb

## 2016-08-20 DIAGNOSIS — R209 Unspecified disturbances of skin sensation: Secondary | ICD-10-CM

## 2016-08-20 DIAGNOSIS — L309 Dermatitis, unspecified: Secondary | ICD-10-CM

## 2016-08-20 DIAGNOSIS — IMO0001 Reserved for inherently not codable concepts without codable children: Secondary | ICD-10-CM

## 2016-08-20 LAB — POCT GLYCOSYLATED HEMOGLOBIN (HGB A1C): HEMOGLOBIN A1C: 5.5

## 2016-08-20 MED ORDER — HYDROCORTISONE 2.5 % EX CREA: TOPICAL_CREAM | Freq: Two times a day (BID) | CUTANEOUS | 0 refills | Status: DC

## 2016-08-20 NOTE — Progress Notes (Signed)
   HPI  CC: Lips and tongue burning  CC, SH/smoking status, and VS noted  Symptoms have occurred since Friday. Went to urgent care yesterday, was given a "magic mouth wash" (has lidocaine) and told to take benadryll, but nothing is working. Persistent, nothing makes it better. Sodas make it worse, eating makes it worse. No issues with water. Patient is a smoker, 1/2 ppd. No new lip balms/makeup.   Rash on cheek present since June, itches, burns, sometimes scabs. Not getting better, not spreading. Ear itches on occasion, no pain. Never had before. Nothing makes it better, tried alcohol, calamine lotion, no improvement.  Objective: BP 140/84   Pulse 84   Temp 98.6 F (37 C) (Oral)   Ht '5\' 4"'$  (1.626 m)   Wt 204 lb 12.8 oz (92.9 kg)   LMP  (LMP Unknown) Comment: on Megace  BMI 35.15 kg/m  Gen: NAD, alert, cooperative, and pleasant. HEENT: NCAT, EOMI, PERRL CV: RRR, no murmur Resp: CTAB, no wheezes, non-labored Abd: SNTND, BS present, no guarding or organomegaly Ext: No edema, warm Neuro: Alert and oriented, Speech clear, No gross deficits Skin: eczematous rash on right cheek, from pre-auricular, spreading to jawline, about        Assessment and plan:  1. Paresthesias/numbness - Unknown etiology - Highly recommended to stop smoking - Benadryl as needed, Ok to continue oral lidocaine swish and spit if helping - B12 - Vitamin D 1,25 dihydroxy - HgB A1c - TSH - Sed Rate (ESR)  2. Eczematous dermatitis - Topical steroid cream - Eczematous vs urticarial, present since June (>3 months)   Zenda Alpers, [facility] 08/20/2016 2:47 PM

## 2016-08-20 NOTE — Patient Instructions (Signed)
Paresthesia Paresthesia is a burning or prickling feeling. This feeling can happen in any part of the body. It often happens in the hands, arms, legs, or feet. Usually, it is not painful. In most cases, the feeling goes away in a short time and is not a sign of a serious problem. HOME CARE  Avoid drinking alcohol.  Try massage or needle therapy (acupuncture) to help with your problems.  Keep all follow-up visits as told by your doctor. This is important. GET HELP IF:  You keep on having episodes of paresthesia.  Your burning or prickling feeling gets worse when you walk.  You have pain or cramps.  You feel dizzy.  You have a rash. GET HELP RIGHT AWAY IF:  You feel weak.  You have trouble walking or moving.  You have problems speaking, understanding, or seeing.  You feel confused.  You cannot control when you pee (urinate) or poop (bowel movement).  You lose feeling (numbness) after an injury.  You pass out (faint).   This information is not intended to replace advice given to you by your health care provider. Make sure you discuss any questions you have with your health care provider.   Document Released: 11/14/2008 Document Revised: 04/18/2015 Document Reviewed: 11/28/2014 Elsevier Interactive Patient Education 2016 [place] is an infection that causes a painful skin rash and fluid-filled blisters. Shingles is caused by the same virus that causes chickenpox. Shingles only develops in people who:  Have had chickenpox.  Have gotten the chickenpox vaccine. (This is rare.) The first symptoms of shingles may be itching, tingling, or pain in an area on your skin. A rash will follow in a few days or weeks. The rash is usually on one side of the body in a bandlike or beltlike pattern. Over time, the rash turns into fluid-filled blisters that break open, scab over, and dry up. Medicines may:  Help you manage pain.  Help you recover more  quickly.  Help to prevent long-term problems. HOME CARE Medicines  Take medicines only as told by your doctor.  Apply an anti-itch or numbing cream to the affected area as told by your doctor. Blister and Rash Care  Take a cool bath or put cool compresses on the area of the rash or blisters as told by your doctor. This may help with pain and itching.  Keep your rash covered with a loose bandage (dressing). Wear loose-fitting clothing.  Keep your rash and blisters clean with mild soap and cool water or as told by your doctor.  Check your rash every day for signs of infection. These include redness, swelling, and pain that lasts or gets worse.  Do not pick your blisters.  Do not scratch your rash. General Instructions  Rest as told by your doctor.  Keep all follow-up visits as told by your doctor. This is important.  Until your blisters scab over, your infection can cause chickenpox in people who have never had it or been vaccinated against it. To prevent this from happening, avoid touching other people or being around other people, especially:  Babies.  Pregnant women.  Children who have eczema.  Elderly people who have transplants.  People who have chronic illnesses, such as leukemia or AIDS. GET HELP IF:  Your pain does not get better with medicine.  Your pain does not get better after the rash heals.  Your rash looks infected. Signs of infection include:  Redness.  Swelling.  Pain that lasts  or gets worse. GET HELP RIGHT AWAY IF:  The rash is on your face or nose.  You have pain in your face, pain around your eye area, or loss of feeling on one side of your face.  You have ear pain or you have ringing in your ear.  You have loss of taste.  Your condition gets worse.   This information is not intended to replace advice given to you by your health care provider. Make sure you discuss any questions you have with your health care provider.   Document  Released: 05/20/2008 Document Revised: 12/23/2014 Document Reviewed: 09/13/2014 Elsevier Interactive Patient Education 2016 [place].   Anaphylactic Reaction An anaphylactic reaction is a sudden, severe allergic reaction. It affects the whole body. It can be life threatening. You may need to stay in the hospital.  [place] a medical bracelet or necklace that lists your allergy.  Carry your allergy kit or medicine shot to treat severe allergic reactions with you. These can save your life.  Do not drive until medicine from your shot has worn off, unless your doctor says it is okay.  If you have hives or a rash:  Take medicine as told by your doctor.  You may take over-the-counter antihistamine medicine.  Place cold cloths on your skin. Take baths in cool water. Avoid hot baths and hot showers. GET HELP RIGHT AWAY IF:   Your mouth is puffy (swollen), or you have trouble breathing.  You start making whistling sounds when you breathe (wheezing).  You have a tight feeling in your chest or throat.  You have a rash, hives, puffiness, or itching on your body.  You throw up (vomit) or have watery poop (diarrhea).  You feel dizzy or pass out (faint).  You think you are having an allergic reaction.  You have new symptoms. This is an emergency. Use your medicine shot or allergy kit as told. Call your local emergency services (911 in [place] Even if you feel better after the shot, you need to go to the hospital emergency department. MAKE SURE YOU:   Understand these instructions.  Will watch your condition.  Will get help right away if you are not doing well or get worse.   This information is not intended to replace advice given to you by your health care provider. Make sure you discuss any questions you have with your health care provider.   Document Released: 05/20/2008 Document Revised: 06/02/2012 Document Reviewed: 06/14/2015 Elsevier Interactive Patient Education  [employer].

## 2016-08-21 ENCOUNTER — Encounter: Payer: Self-pay | Admitting: Family Medicine

## 2016-08-21 LAB — VITAMIN B12: Vitamin B-12: 233 pg/mL (ref 200–1100)

## 2016-08-21 LAB — SEDIMENTATION RATE: Sed Rate: 4 mm/h (ref 0–20)

## 2016-08-21 LAB — TSH: TSH: 4.4 mIU/L

## 2016-08-22 LAB — VITAMIN D 1,25 DIHYDROXY
VITAMIN D 1, 25 (OH) TOTAL: 63 pg/mL (ref 18–72)
Vitamin D2 1, 25 (OH)2: <8 pg/mL
Vitamin D3 1, 25 (OH)2: 63 pg/mL

## 2016-08-27 ENCOUNTER — Encounter: Payer: Self-pay | Admitting: Internal Medicine

## 2016-08-27 ENCOUNTER — Ambulatory Visit (INDEPENDENT_AMBULATORY_CARE_PROVIDER_SITE_OTHER): Payer: Self-pay | Admitting: Internal Medicine

## 2016-08-27 VITALS — BP 131/85 | HR 73 | Temp 98.6°F | Ht 64.0 in | Wt 209.0 lb

## 2016-08-27 DIAGNOSIS — M791 Myalgia, unspecified site: Secondary | ICD-10-CM

## 2016-08-27 DIAGNOSIS — R202 Paresthesia of skin: Secondary | ICD-10-CM

## 2016-08-27 DIAGNOSIS — R21 Rash and other nonspecific skin eruption: Secondary | ICD-10-CM

## 2016-08-27 DIAGNOSIS — F411 Generalized anxiety disorder: Secondary | ICD-10-CM

## 2016-08-27 MED ORDER — PREDNISONE 20 MG PO TABS: ORAL_TABLET | ORAL | 0 refills | Status: DC

## 2016-08-27 MED ORDER — IBUPROFEN 800 MG PO TABS: 800.0000 mg | ORAL_TABLET | Freq: Four times a day (QID) | ORAL | 0 refills | Status: DC | PRN

## 2016-08-27 MED ORDER — CLONAZEPAM 0.5 MG PO TABS: 0.5000 mg | ORAL_TABLET | Freq: Two times a day (BID) | ORAL | 1 refills | Status: DC | PRN

## 2016-08-27 NOTE — Patient Instructions (Addendum)
Ms. Mario, Yohn to meet you.  Please take prednisone once daily for the next 5 days to help with itching. Please take this with meals.  Please make an appointment in about 1 month to continue to discuss anxiety.  I recommend follow-up with our dermatology clinic. You can ask for an appointment up front. You can also get a flu shot then or with a nursing visit.  Best, Dr. Ola Spurr

## 2016-08-27 NOTE — Progress Notes (Signed)
[name] Progress Note  Subjective:  Mariah Rice is a 44-y/o female with history of HTN, depression and anxiety, and tobacco abuse who presents for follow-up of mouth tingling.  Mouth Tingling: - Was assessed for this in urgent care then at [facility] about 2 weeks ago - Sensation has become much less intense and can finally brush her teeth, which was too irritating previously  - Did not improve much with benadryl or magic mouthwash - Still smoking 1/2 ppd and does not think she can quit because boyfriend and his mom are very active smokers, which is a constant trigger - Now eating normally - Sees a dentist regularly - Had B12, vit D, Hgb A1c, TSH, and ESR checked at last office visit -- all were WNL - Sees a dentist regularly ROS: No nausea or vomiting, no sore throat  R Cheek Rash: - Present since March, per patient. 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 Says she does not have time for counseling.    Allergies  Allergen Reactions  . Penicillins Nausea Only and Rash    Has patient had a PCN reaction causing immediate rash, facial/tongue/throat swelling, SOB or lightheadedness with hypotension: no Has patient had a PCN reaction causing severe rash involving mucus membranes or skin necrosis:unknown Has patient had a PCN reaction that required hospitalization : yes Has patient had a PCN reaction  occurring within the last 10 years: no If all of the above answers are "NO", then may proceed with Cephalosporin use.   . Sulfa Antibiotics Nausea Only and Rash    Objective: Blood pressure 131/85, pulse 73, temperature 98.6 F (37 C), temperature source Oral, height _0  (1.626 m), weight 209 lb (94.8 kg). Constitutional: Obese female, in NAD HENT: Normocephalic, no lesions of oropharynx, multiple fillings, tongue without coating Lymph: No cervical or submandibular lymphadenopathy Cardiovascular: RRR, S1, S2, no m/r/g.  Pulmonary/Chest: Effort normal and breath sounds normal. No respiratory distress.  Neurological: AOx3, no focal deficits. Skin: Raised maculopapular rash with slight erythema of R cheek around jawline (see last note for picture) Psychiatric: Normal mood and anxious affect. Became tearful when talking about father who passed away 2 years ago.  Vitals reviewed  GAD 7 : Generalized Anxiety Score 08/27/2016  Nervous, Anxious, on Edge 3  Control/stop worrying 3  Worry too much - different things 3  Trouble relaxing 2  Restless 3  Easily annoyed or irritable 3  Afraid - awful might happen 1  Total GAD 7 Score 18  Anxiety Difficulty Somewhat difficult   Assessment/Plan: Tingling sensation - Improving. Unclear etiology. - Recommended smoking cessation but patient not interested in quitting at this time  Rash and nonspecific skin eruption - Suspect contact dermatitis but extended duration and long smoking history concerning for cancer - Recommended 5 day course of prednisone. Prescribed on 20 mg daily, as could worsen patient's anxiety - Recommended biopsy at [facility]  derm clinic  GAD (generalized anxiety disorder) - GAD 7 score indicates severe anxiety, though patient reports symptoms make life only somewhat difficult - Refilled klonopin. Did not increase current dose. - Continue celexa. Already on max dose. - Suggested seeing how anxiety is affected by more regular  job hours (just started a day ago) and re-assessing in about 1 month - Could consider adding wellbutrin if patient still has severe-range scores at follow-up. Adding psychotherapy would be ideal but pt not currently interested.  Follow-up in 1 month for anxiety.  Olene Floss, MD [place], PGY-2

## 2016-08-28 DIAGNOSIS — R202 Paresthesia of skin: Secondary | ICD-10-CM | POA: Insufficient documentation

## 2016-08-28 NOTE — Assessment & Plan Note (Signed)
-   GAD 7 score indicates severe anxiety, though patient reports symptoms make life only somewhat difficult - Refilled klonopin. Did not increase current dose. - Continue celexa. Already on max dose. - Suggested seeing how anxiety is affected by more regular job hours (just started a day ago) and re-assessing in about 1 month - Could consider adding wellbutrin if patient still has severe-range scores at follow-up. Adding psychotherapy would be ideal but pt not currently interested.

## 2016-08-28 NOTE — Assessment & Plan Note (Signed)
-   Improving. Unclear etiology. - Recommended smoking cessation but patient not interested in quitting at this time

## 2016-08-28 NOTE — Assessment & Plan Note (Addendum)
-   Suspect contact dermatitis but extended duration and long smoking history concerning for cancer - Recommended 5 day course of prednisone. Prescribed on 20 mg daily, as could worsen patient's anxiety - Recommended biopsy at [facility] derm clinic

## 2016-09-20 ENCOUNTER — Ambulatory Visit: Payer: BLUE CROSS/BLUE SHIELD | Admitting: Internal Medicine

## 2016-09-24 ENCOUNTER — Other Ambulatory Visit: Payer: Self-pay | Admitting: Internal Medicine

## 2016-09-24 DIAGNOSIS — M791 Myalgia, unspecified site: Secondary | ICD-10-CM

## 2016-09-30 ENCOUNTER — Ambulatory Visit (INDEPENDENT_AMBULATORY_CARE_PROVIDER_SITE_OTHER): Payer: 59 | Admitting: Internal Medicine

## 2016-09-30 ENCOUNTER — Encounter: Payer: Self-pay | Admitting: Internal Medicine

## 2016-09-30 VITALS — BP 153/79 | HR 62 | Temp 98.0°F | Ht 64.0 in | Wt 211.2 lb

## 2016-09-30 DIAGNOSIS — F411 Generalized anxiety disorder: Secondary | ICD-10-CM | POA: Diagnosis not present

## 2016-09-30 DIAGNOSIS — M25511 Pain in right shoulder: Secondary | ICD-10-CM

## 2016-09-30 DIAGNOSIS — F39 Unspecified mood [affective] disorder: Secondary | ICD-10-CM

## 2016-09-30 DIAGNOSIS — M791 Myalgia, unspecified site: Secondary | ICD-10-CM

## 2016-09-30 DIAGNOSIS — I10 Essential (primary) hypertension: Secondary | ICD-10-CM

## 2016-09-30 MED ORDER — BACLOFEN 10 MG PO TABS: 10.0000 mg | ORAL_TABLET | Freq: Three times a day (TID) | ORAL | 0 refills | Status: DC | PRN

## 2016-09-30 MED ORDER — IBUPROFEN 800 MG PO TABS: 800.0000 mg | ORAL_TABLET | Freq: Four times a day (QID) | ORAL | 0 refills | Status: DC | PRN

## 2016-09-30 NOTE — Patient Instructions (Signed)
Ms. Mariah Rice,  Thank you for coming in.  Please take baclofen as needed for muscle spasm and ibuprofen. Do not take baclofen with klonopin or with alcohol, as this increases sedation.  If pain continues for another 2 weeks, let me know, and xray could be considered.  Best, Dr. Ola Spurr   Muscle Strain A muscle strain is an injury that occurs when a muscle is stretched beyond its normal length. Usually a small number of muscle fibers are torn when this happens. Muscle strain is rated in degrees. First-degree strains have the least amount of muscle fiber tearing and pain. Second-degree and third-degree strains have increasingly more tearing and pain.  Usually, recovery from muscle strain takes 1-2 weeks. Complete healing takes 5-6 weeks.  CAUSES  Muscle strain happens when a sudden, violent force placed on a muscle stretches it too far. This may occur with lifting, sports, or a fall.  RISK FACTORS Muscle strain is especially common in athletes.  SIGNS AND SYMPTOMS At the site of the muscle strain, there may be:  Pain.  Bruising.  Swelling.  Difficulty using the muscle due to pain or lack of normal function. DIAGNOSIS  Your health care provider will perform a physical exam and ask about your medical history. TREATMENT  Often, the best treatment for a muscle strain is resting, icing, and applying cold compresses to the injured area.  HOME CARE INSTRUCTIONS   Use the PRICE method of treatment to promote muscle healing during the first 2-3 days after your injury. The PRICE method involves:  Protecting the muscle from being injured again.  Restricting your activity and resting the injured body part.  Icing your injury. To do this, put ice in a plastic bag. Place a towel between your skin and the bag. Then, apply the ice and leave it on from 15-20 minutes each hour. After the third day, switch to moist heat packs.  Apply compression to the injured area with a splint or elastic  bandage. Be careful not to wrap it too tightly. This may interfere with blood circulation or increase swelling.  Elevate the injured body part above the level of your heart as often as you can.  Only take over-the-counter or prescription medicines for pain, discomfort, or fever as directed by your health care provider.  Warming up prior to exercise helps to prevent future muscle strains. SEEK MEDICAL CARE IF:   You have increasing pain or swelling in the injured area.  You have numbness, tingling, or a significant loss of strength in the injured area. MAKE SURE YOU:   Understand these instructions.  Will watch your condition.  Will get help right away if you are not doing well or get worse.   This information is not intended to replace advice given to you by your health care provider. Make sure you discuss any questions you have with your health care provider.   Document Released: 12/02/2005 Document Revised: 09/22/2013 Document Reviewed: 07/01/2013 Elsevier Interactive Patient Education [employer].
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
## 2016-10-06 DIAGNOSIS — M25519 Pain in unspecified shoulder: Secondary | ICD-10-CM | POA: Insufficient documentation

## 2016-10-06 DIAGNOSIS — F39 Unspecified mood [affective] disorder: Secondary | ICD-10-CM | POA: Insufficient documentation

## 2016-10-06 DIAGNOSIS — M25511 Pain in right shoulder: Secondary | ICD-10-CM | POA: Insufficient documentation

## 2016-10-06 NOTE — Assessment & Plan Note (Signed)
-   Suspect muscle strain after injury. Recommended ibuprofen, heat and doing light arm exercises to keep shoulder from getting stiff (walking hand up wall) - Prescribed baclofen, as patient said flexeril did not help. Advised to use sparingly and not to take with klonopin or alcohol

## 2016-10-06 NOTE — Assessment & Plan Note (Signed)
-   Patient not interested or counseling or medication changes at this time. Thinks situational job loss is contributing.  - Asked to call if she changes her mind about making medication changes

## 2016-10-06 NOTE — Assessment & Plan Note (Signed)
-   Not at goal of <140/90 in setting of pain. BP has been at goal at last 2 visits. - Recheck BMP to monitor K and SCr at next visit.

## 2016-10-09 ENCOUNTER — Ambulatory Visit (INDEPENDENT_AMBULATORY_CARE_PROVIDER_SITE_OTHER): Payer: 59 | Admitting: Family Medicine

## 2016-10-09 DIAGNOSIS — R21 Rash and other nonspecific skin eruption: Secondary | ICD-10-CM | POA: Diagnosis not present

## 2016-10-09 NOTE — Assessment & Plan Note (Signed)
Maculopapular rash of bilateral face. Failed treatment with multiple steroids. Suspect possible fungal rash. -punch biopsy obtained to determine etiology of rash

## 2016-10-09 NOTE — Progress Notes (Signed)
   Subjective:    Patient ID: Mariah Rice, female    DOB: 1971-09-20, 45 y.o.   MRN: [id number]  HPI 45 y/o female presents to [facility]atology Clinic for evaluation of bilateral face rash.  Present since March 2016. Occurred after sun exposure. Erythematous at times. Pruritis present. Failed treatment with topical steroids, Calamine lotion, oral prednisone.    Review of Systems     Objective:   Physical Exam BP (!) 148/72   Pulse 69   Temp 98.6 F (37 C) (Oral)   Ht 5\' 4"  (1.626 m)   Wt 213 lb (96.6 kg)   LMP  (LMP Unknown) Comment: on Megace  BMI 36.56 kg/m   Skin: slightly erythematous maculopapular rash of bilateral cheeks (scattered hypopigmentation), no telangiectasia present   Left Face    Right Face (Biopsy performed on this side)  Procedure Note: Punch Biopsy (Performed by Dr. Yisroel Ramming, Dr. Ree Kida Present) Written and Verbal Consent obtained. Risks and benefits of procedure discussed with patient. Area prepped in sterile fashion. Using a 5 cc syringe and 25 gauge 1.0 inch needle approximately 1 cc of 1% Lidocaine with Epinephrine was instilled into the lesion. A 3 mm punch was performed. Using pickups and iris scissors the biopsy specimen was removed. Hemostasis was achieved. No complications. Band-aid applied. Specimen sent for pathology.       Assessment & Plan:  Rash and nonspecific skin eruption Maculopapular rash of bilateral face. Failed treatment with multiple steroids. Suspect possible fungal rash. -punch biopsy obtained to determine etiology of rash

## 2016-10-09 NOTE — Patient Instructions (Signed)
It was nice to meet you today. Dr. Ree Kida will call you with the biopsy results.   Please keep the area clean. You may apply Neosporin to the biopsy area 1-2 times per day. Keep covered with a bandaid for 1-2 days. Please call the office if you develop redness around the area of have fevers.

## 2016-10-10 ENCOUNTER — Encounter: Payer: Self-pay | Admitting: Internal Medicine

## 2016-10-14 ENCOUNTER — Telehealth: Payer: Self-pay | Admitting: Family Medicine

## 2016-10-14 DIAGNOSIS — L719 Rosacea, unspecified: Secondary | ICD-10-CM | POA: Insufficient documentation

## 2016-10-14 MED ORDER — AZELAIC ACID 15 % EX GEL: CUTANEOUS | 1 refills | Status: DC

## 2016-10-14 NOTE — Telephone Encounter (Signed)
Discussed biopsy results that showed rosacea. Will start Azeleic acid.

## 2016-10-14 NOTE — Telephone Encounter (Signed)
Attempted to contact patient with biopsy results. Left message that I would call again in near future.

## 2016-10-14 NOTE — Assessment & Plan Note (Signed)
Biopsy showed rosacea. Start azeleic acid.

## 2016-10-16 ENCOUNTER — Telehealth: Payer: Self-pay | Admitting: Family Medicine

## 2016-10-16 DIAGNOSIS — L719 Rosacea, unspecified: Secondary | ICD-10-CM

## 2016-10-16 MED ORDER — AZELAIC ACID 15 % EX GEL: CUTANEOUS | 2 refills | Status: DC

## 2016-10-16 NOTE — Telephone Encounter (Signed)
New prescription sent in earlier today.

## 2016-10-16 NOTE — Telephone Encounter (Signed)
Received fax from pharmacy stating that Azelaic acid only comes in a gel not a cream.  A new script for this will need to be sent into the pharmacy. Jazmin Hartsell,CMA

## 2016-10-18 ENCOUNTER — Telehealth: Payer: Self-pay | Admitting: *Deleted

## 2016-10-18 MED ORDER — AZELAIC ACID 15 % EX GEL: CUTANEOUS | 1 refills | Status: DC

## 2016-10-18 NOTE — Telephone Encounter (Signed)
Rx can only be for a Foam or Gel, not a cream.  Please advise. Kimberlyann Hollar, Salome Spotted, CMA

## 2016-10-18 NOTE — Telephone Encounter (Signed)
Pharmacy calling stating they do no carry azelaic acid in the cream. Only comes in gel or foam. Please re-send rx. [facility] [address]

## 2016-10-18 NOTE — Telephone Encounter (Signed)
Prescription for gel sent to pharmacy.

## 2016-10-28 ENCOUNTER — Encounter: Payer: Self-pay | Admitting: Internal Medicine

## 2016-10-28 ENCOUNTER — Encounter: Payer: Self-pay | Admitting: Family Medicine

## 2016-10-29 ENCOUNTER — Other Ambulatory Visit: Payer: Self-pay | Admitting: *Deleted

## 2016-10-29 DIAGNOSIS — Z90722 Acquired absence of ovaries, bilateral: Secondary | ICD-10-CM

## 2016-10-30 ENCOUNTER — Other Ambulatory Visit: Payer: Self-pay | Admitting: Internal Medicine

## 2016-10-30 DIAGNOSIS — L719 Rosacea, unspecified: Secondary | ICD-10-CM

## 2016-10-30 MED ORDER — AZELAIC ACID 15 % EX FOAM: 1.0000 | Freq: Two times a day (BID) | CUTANEOUS | 1 refills | Status: DC

## 2016-10-30 MED ORDER — ESTRADIOL 2 MG PO TABS: 2.0000 mg | ORAL_TABLET | Freq: Every day | ORAL | 4 refills | Status: DC

## 2016-10-30 NOTE — Telephone Encounter (Signed)
2nd request. Mykah Shin,CMA  

## 2016-10-30 NOTE — Telephone Encounter (Signed)
Called to let patient know estrace was refilled and rosacea medication was changed to a gel.

## 2016-11-09 ENCOUNTER — Other Ambulatory Visit: Payer: Self-pay | Admitting: Internal Medicine

## 2016-11-09 DIAGNOSIS — M791 Myalgia, unspecified site: Secondary | ICD-10-CM

## 2016-11-12 NOTE — Telephone Encounter (Signed)
LMOVM for pt to call us back. Rebekkah Powless, CMA  

## 2016-11-19 ENCOUNTER — Other Ambulatory Visit: Payer: Self-pay | Admitting: *Deleted

## 2016-11-19 DIAGNOSIS — M791 Myalgia, unspecified site: Secondary | ICD-10-CM

## 2016-11-22 ENCOUNTER — Other Ambulatory Visit: Payer: Self-pay | Admitting: Internal Medicine

## 2016-11-22 DIAGNOSIS — M791 Myalgia, unspecified site: Secondary | ICD-10-CM

## 2016-11-29 ENCOUNTER — Ambulatory Visit (INDEPENDENT_AMBULATORY_CARE_PROVIDER_SITE_OTHER): Payer: Self-pay | Admitting: Student

## 2016-11-29 ENCOUNTER — Encounter: Payer: Self-pay | Admitting: Student

## 2016-11-29 VITALS — BP 140/84 | HR 71 | Temp 98.3°F | Wt 212.0 lb

## 2016-11-29 DIAGNOSIS — I10 Essential (primary) hypertension: Secondary | ICD-10-CM

## 2016-11-29 DIAGNOSIS — G43109 Migraine with aura, not intractable, without status migrainosus: Secondary | ICD-10-CM

## 2016-11-29 LAB — BASIC METABOLIC PANEL WITH GFR
BUN: 13 mg/dL (ref 7–25)
CALCIUM: 9.2 mg/dL (ref 8.6–10.2)
CHLORIDE: 102 mmol/L (ref 98–110)
CO2: 27 mmol/L (ref 20–31)
CREATININE: 0.86 mg/dL (ref 0.50–1.10)
GFR, Est African American: >89 mL/min (ref >=60)
GFR, Est Non African American: 83 mL/min (ref >=60)
Glucose, Bld: 91 mg/dL (ref 65–99)
Potassium: 3.5 mmol/L (ref 3.5–5.3)
SODIUM: 138 mmol/L (ref 135–146)

## 2016-11-29 MED ORDER — BUTALBITAL-APAP-CAFFEINE 50-325-40 MG PO TABS: 1.0000 | ORAL_TABLET | Freq: Four times a day (QID) | ORAL | 0 refills | Status: DC | PRN

## 2016-11-29 MED ORDER — KETOROLAC TROMETHAMINE 30 MG/ML IJ SOLN
15.0000 mg | Freq: Once | INTRAMUSCULAR | Status: AC
  Administered 2016-11-29: 15 mg via INTRAMUSCULAR

## 2016-11-29 NOTE — Progress Notes (Signed)
   Subjective:    Patient ID: Mariah Rice, female    DOB: Feb 21, 1971, 45 y.o.   MRN: [id number]   CC: [place]  HPI: 45 y/o F presenting for headache  Headache - Started to have worsening daily headaches for the last week - she started watering down her coffee one week ago because it was too hot - she denies weakness, changes in vision, dizziness - she denies recent illness, fever, trauma  Smoking status reviewed - current smoker  Review of Systems  Per HPI, else denies chest pain, shortness of breath, N/V/D,    Objective:  BP 140/84   Pulse 71   Temp 98.3 F (36.8 C) (Oral)   Wt 212 lb (96.2 kg)   LMP  (LMP Unknown) Comment: on Megace  BMI 36.39 kg/m  Vitals and nursing note reviewed  General: NAD HEENT: Head atraumatic, EOMI, PERRL, no nystagmus noted Cardiac: RRR Respiratory: CTAB, normal effort Extremities: no edema or cyanosis.  Skin: warm and dry, no rashes noted Neuro: alert and oriented   Assessment & Plan:    Hypertension BP above goal 140/84 - BMP today - will f/u with PCP to start antihypertensive regimen - will not start today given chief complaint of headache  Headache No red flag symptoms, No fever or trauma makes menigeal irritation less likley. History makes headache more consistent with caffeine withdrawal headache - toradol 15 IM now - fioricet - discussed cause of headache and need to gradually reduce affine intake if dsired    Woodson Macha A. Lincoln Brigham MD, [facility] Resident PGY-3 Pager [phone]

## 2016-11-29 NOTE — Patient Instructions (Signed)
Follow up with PCP  Take Fioricet as needed for headache Try Excedrin Migraine for headache If you have questions or concerns, call the office at [phone]

## 2016-11-29 NOTE — Assessment & Plan Note (Signed)
BP above goal 140/84 - BMP today - will f/u with PCP to start antihypertensive regimen - will not start today given chief complaint of headache

## 2016-11-29 NOTE — Assessment & Plan Note (Addendum)
No red flag symptoms, No fever or trauma makes menigeal irritation less likley. History makes headache more consistent with caffeine withdrawal headache - toradol 15 IM now - fioricet - discussed cause of headache and need to gradually reduce affine intake if dsired

## 2016-12-07 ENCOUNTER — Other Ambulatory Visit: Payer: Self-pay | Admitting: Internal Medicine

## 2016-12-07 DIAGNOSIS — M791 Myalgia, unspecified site: Secondary | ICD-10-CM

## 2016-12-10 ENCOUNTER — Encounter: Payer: Self-pay | Admitting: Internal Medicine

## 2016-12-11 ENCOUNTER — Other Ambulatory Visit: Payer: Self-pay | Admitting: Internal Medicine

## 2016-12-11 DIAGNOSIS — Z9109 Other allergy status, other than to drugs and biological substances: Secondary | ICD-10-CM

## 2016-12-11 DIAGNOSIS — F32A Depression, unspecified: Secondary | ICD-10-CM

## 2016-12-11 DIAGNOSIS — F411 Generalized anxiety disorder: Secondary | ICD-10-CM

## 2016-12-11 DIAGNOSIS — L719 Rosacea, unspecified: Secondary | ICD-10-CM

## 2016-12-11 DIAGNOSIS — R51 Headache: Principal | ICD-10-CM

## 2016-12-11 DIAGNOSIS — M791 Myalgia, unspecified site: Secondary | ICD-10-CM

## 2016-12-11 DIAGNOSIS — R519 Headache, unspecified: Secondary | ICD-10-CM

## 2016-12-11 DIAGNOSIS — F329 Major depressive disorder, single episode, unspecified: Secondary | ICD-10-CM

## 2016-12-11 MED ORDER — FLUTICASONE PROPIONATE 50 MCG/ACT NA SUSP: 2.0000 | Freq: Every day | NASAL | 11 refills | Status: DC

## 2016-12-11 MED ORDER — AZELAIC ACID 15 % EX FOAM: 1.0000 | Freq: Two times a day (BID) | CUTANEOUS | 1 refills | Status: DC

## 2016-12-11 MED ORDER — BUTALBITAL-APAP-CAFFEINE 50-325-40 MG PO TABS: 1.0000 | ORAL_TABLET | Freq: Four times a day (QID) | ORAL | 0 refills | Status: DC | PRN

## 2016-12-11 MED ORDER — IBUPROFEN 800 MG PO TABS: 800.0000 mg | ORAL_TABLET | Freq: Four times a day (QID) | ORAL | 0 refills | Status: DC | PRN

## 2016-12-11 MED ORDER — CITALOPRAM HYDROBROMIDE 40 MG PO TABS: 40.0000 mg | ORAL_TABLET | Freq: Every day | ORAL | 3 refills | Status: DC

## 2016-12-11 NOTE — Telephone Encounter (Signed)
Placed orders for refills as requested by patient. Not due for klonopin refill yet.

## 2016-12-20 ENCOUNTER — Other Ambulatory Visit: Payer: Self-pay | Admitting: *Deleted

## 2016-12-20 DIAGNOSIS — I10 Essential (primary) hypertension: Secondary | ICD-10-CM

## 2016-12-20 MED ORDER — TRIAMTERENE-HCTZ 37.5-25 MG PO TABS: 1.0000 | ORAL_TABLET | Freq: Every day | ORAL | 3 refills | Status: DC

## 2017-01-21 ENCOUNTER — Other Ambulatory Visit: Payer: Self-pay | Admitting: Internal Medicine

## 2017-01-21 DIAGNOSIS — M791 Myalgia, unspecified site: Secondary | ICD-10-CM

## 2017-01-29 ENCOUNTER — Ambulatory Visit (INDEPENDENT_AMBULATORY_CARE_PROVIDER_SITE_OTHER): Payer: Self-pay | Admitting: Internal Medicine

## 2017-01-29 DIAGNOSIS — R200 Anesthesia of skin: Secondary | ICD-10-CM | POA: Insufficient documentation

## 2017-01-29 DIAGNOSIS — R202 Paresthesia of skin: Secondary | ICD-10-CM

## 2017-01-29 MED ORDER — AMITRIPTYLINE HCL 75 MG PO TABS: 75.0000 mg | ORAL_TABLET | Freq: Every day | ORAL | 0 refills | Status: DC

## 2017-01-29 MED ORDER — AMITRIPTYLINE HCL 150 MG PO TABS: 150.0000 mg | ORAL_TABLET | Freq: Every day | ORAL | 0 refills | Status: DC

## 2017-01-29 MED ORDER — GABAPENTIN 100 MG PO CAPS: 100.0000 mg | ORAL_CAPSULE | Freq: Three times a day (TID) | ORAL | 3 refills | Status: DC

## 2017-01-29 MED ORDER — AMITRIPTYLINE HCL 150 MG PO TABS: 75.0000 mg | ORAL_TABLET | Freq: Every day | ORAL | 0 refills | Status: DC

## 2017-01-29 NOTE — Patient Instructions (Addendum)
I want you try Amitriptyline.You can take it 3 times a day. Please follow up 1 week. Please work on getting the orange care.

## 2017-01-29 NOTE — Progress Notes (Signed)
   [name] Kerrin Mo, MD Phone: [phone]  Reason For Visit: Aching/burning sensation in bilateral hands   # Patient started burning and aching in bilateral hands. 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 Patient denies every having this before. Patient states that finger tips are going numb. Patient took 800 mg of ibuprofen, however does the feel like it helped. No new medications on board. No other associated symptoms. No fever or chills. States that the pain radiates up arms of both sides intermittently. No chest  Pain. No  SOB. No recent anxiety attacks. Patient has been smoking for about 30 years.    Past Medical History Reviewed problem list.  Medications- reviewed and updated No additions to family history Social history- patient is a smoker  Objective: LMP  (LMP Unknown) Comment: on Megace Gen: NAD, alert, cooperative with exam Cardio: regular rate and rhythm, S1S2 heard, no murmurs appreciated Pulm: clear to auscultation bilaterally, no wheezes, rhonchi or rales Musc: No tenderness along cervical spine, negative Spurling, slight changes in sensation bilateral in finger tips, not following a specific dermatomal pattern, normal strength in bilateral upper extremities, normal bilateral biceps reflexes  Skin: dry, intact, no rashes or lesions  Assessment/Plan: See problem based a/p  Numbness and tingling in both hands Sudden onset of numbness and tingling, about 4 hours ago. Concern for possible cervical herniation, possibly midline causing symptoms. Recent blood work done without any abnormalities noted in September including thryoid, A1C, normal kidney function, therefore less concern for endocrine vs electrolyte  - Xray cervical  - Start Amtripyline 75 mg as patient has no insurance - If no improvement in 1 week, could consider checking electrolytes  - Patient to able for orange card - May need an MRI in the  future  - Discussed with Dr. Nori Riis

## 2017-02-03 NOTE — Assessment & Plan Note (Addendum)
Sudden onset of numbness and tingling, about 4 hours ago. Concern for possible cervical herniation, possibly midline causing symptoms. Recent blood work done without any abnormalities noted in September including thryoid, A1C, normal kidney function, therefore less concern for endocrine vs electrolyte  - Xray cervical  - Start Amtripyline 75 mg as patient has no insurance - If no improvement in 1 week, could consider checking electrolytes  - Patient to able for orange card - May need an MRI in the future  - Discussed with Dr. Nori Riis

## 2017-02-04 ENCOUNTER — Ambulatory Visit (INDEPENDENT_AMBULATORY_CARE_PROVIDER_SITE_OTHER): Payer: Self-pay | Admitting: Internal Medicine

## 2017-02-04 VITALS — BP 132/88 | HR 78 | Temp 97.9°F | Ht 64.0 in | Wt 209.0 lb

## 2017-02-04 DIAGNOSIS — R200 Anesthesia of skin: Secondary | ICD-10-CM

## 2017-02-04 DIAGNOSIS — R202 Paresthesia of skin: Secondary | ICD-10-CM

## 2017-02-04 DIAGNOSIS — M79641 Pain in right hand: Secondary | ICD-10-CM

## 2017-02-04 DIAGNOSIS — M792 Neuralgia and neuritis, unspecified: Secondary | ICD-10-CM

## 2017-02-04 DIAGNOSIS — M79642 Pain in left hand: Secondary | ICD-10-CM

## 2017-02-04 MED ORDER — AMITRIPTYLINE HCL 75 MG PO TABS: 75.0000 mg | ORAL_TABLET | Freq: Every day | ORAL | 2 refills | Status: DC

## 2017-02-04 NOTE — Patient Instructions (Addendum)
Mariah Rice,  Continue amitriptyline for nerve pain. Take ibuprofen as needed. Back exercises can help if there is an element of muscle strain. Strengthening your back muscles can also help provide more support when you have an injured area. I have ordered a back xray of upper spine. As soon as you get the [facility], I recommend having that performed. I will call you with results.  Best, Dr. Ola Spurr   Back Exercises Introduction If you have pain in your back, do these exercises 2-3 times each day or as told by your doctor. When the pain goes away, do the exercises once each day, but repeat the steps more times for each exercise (do more repetitions). If you do not have pain in your back, do these exercises once each day or as told by your doctor. Exercises Single Knee to Chest  Do these steps 3-5 times in a row for each leg: 1. Lie on your back on a firm bed or the floor with your legs stretched out. 2. Bring one knee to your chest. 3. Hold your knee to your chest by grabbing your knee or thigh. 4. Pull on your knee until you feel a gentle stretch in your lower back. 5. Keep doing the stretch for 10-30 seconds. 6. Slowly let go of your leg and straighten it. Pelvic Tilt  Do these steps 5-10 times in a row: 1. Lie on your back on a firm bed or the floor with your legs stretched out. 2. Bend your knees so they point up to the ceiling. Your feet should be flat on the floor. 3. Tighten your lower belly (abdomen) muscles to press your lower back against the floor. This will make your tailbone point up to the ceiling instead of pointing down to your feet or the floor. 4. Stay in this position for 5-10 seconds while you gently tighten your muscles and breathe evenly. Cat-Cow  Do these steps until your lower back bends more easily: 1. Get on your hands and knees on a firm surface. Keep your hands under your shoulders, and keep your knees under your hips. You may put padding under your  knees. 2. Let your head hang down, and make your tailbone point down to the floor so your lower back is round like the back of a cat. 3. Stay in this position for 5 seconds. 4. Slowly lift your head and make your tailbone point up to the ceiling so your back hangs low (sags) like the back of a cow. 5. Stay in this position for 5 seconds. Press-Ups  Do these steps 5-10 times in a row: 1. Lie on your belly (face-down) on the floor. 2. Place your hands near your head, about shoulder-width apart. 3. While you keep your back relaxed and keep your hips on the floor, slowly straighten your arms to raise the top half of your body and lift your shoulders. Do not use your back muscles. To make yourself more comfortable, you may change where you place your hands. 4. Stay in this position for 5 seconds. 5. Slowly return to lying flat on the floor. Bridges  Do these steps 10 times in a row: 1. Lie on your back on a firm surface. 2. Bend your knees so they point up to the ceiling. Your feet should be flat on the floor. 3. Tighten your butt muscles and lift your butt off of the floor until your waist is almost as high as your knees. If you do not  feel the muscles working in your butt and the back of your thighs, slide your feet 1-2 inches farther away from your butt. 4. Stay in this position for 3-5 seconds. 5. Slowly lower your butt to the floor, and let your butt muscles relax. If this exercise is too easy, try doing it with your arms crossed over your chest. Belly Crunches  Do these steps 5-10 times in a row: 1. Lie on your back on a firm bed or the floor with your legs stretched out. 2. Bend your knees so they point up to the ceiling. Your feet should be flat on the floor. 3. Cross your arms over your chest. 4. Tip your chin a little bit toward your chest but do not bend your neck. 5. Tighten your belly muscles and slowly raise your chest just enough to lift your shoulder blades a tiny bit off of the  floor. 6. Slowly lower your chest and your head to the floor. Back Lifts  Do these steps 5-10 times in a row: 1. Lie on your belly (face-down) with your arms at your sides, and rest your forehead on the floor. 2. Tighten the muscles in your legs and your butt. 3. Slowly lift your chest off of the floor while you keep your hips on the floor. Keep the back of your head in line with the curve in your back. Look at the floor while you do this. 4. Stay in this position for 3-5 seconds. 5. Slowly lower your chest and your face to the floor. Contact a doctor if:  Your back pain gets a lot worse when you do an exercise.  Your back pain does not lessen 2 hours after you exercise. If you have any of these problems, stop doing the exercises. Do not do them again unless your doctor says it is okay. Get help right away if:  You have sudden, very bad back pain. If this happens, stop doing the exercises. Do not do them again unless your doctor says it is okay. This information is not intended to replace advice given to you by your health care provider. Make sure you discuss any questions you have with your health care provider. Document Released: 01/04/2011 Document Revised: 05/09/2016 Document Reviewed: 01/26/2015  2017 Elsevier
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
## 2017-02-08 NOTE — Assessment & Plan Note (Signed)
-   Slightly improved since starting amitriptyline.  - Refilled amitriptyline and placed order for c-spine xrays for patient to have performed once she is enrolled in [employer].  - Recommended ibuprofen prn and stretching exercises.  - Provided note for work, requesting lifting restrictions of no more than 25 lbs for next 6 weeks

## 2017-02-27 ENCOUNTER — Other Ambulatory Visit: Payer: Self-pay | Admitting: *Deleted

## 2017-02-27 DIAGNOSIS — F411 Generalized anxiety disorder: Secondary | ICD-10-CM

## 2017-02-28 ENCOUNTER — Other Ambulatory Visit: Payer: Self-pay | Admitting: *Deleted

## 2017-02-28 DIAGNOSIS — F411 Generalized anxiety disorder: Secondary | ICD-10-CM

## 2017-03-02 ENCOUNTER — Telehealth: Payer: Self-pay | Admitting: Internal Medicine

## 2017-03-02 NOTE — Telephone Encounter (Signed)
Late entry from 02/28/17: Received pharmacy refill request for klonopin. Attempted to reach patient to see if she was entirely out of this medication, as otherwise would prefer to discuss refills in person at office visit, as plan has been to wean off. Phone number provided did not have option for leaving voicemail. Placed prescription by phone to [facility] on [address] for #[facility] pills with instructions that appointment needed for further refills.  Olene Floss, MD [place], PGY-2

## 2017-03-06 ENCOUNTER — Other Ambulatory Visit: Payer: Self-pay | Admitting: Internal Medicine

## 2017-03-06 DIAGNOSIS — M791 Myalgia, unspecified site: Secondary | ICD-10-CM

## 2017-03-10 ENCOUNTER — Ambulatory Visit (INDEPENDENT_AMBULATORY_CARE_PROVIDER_SITE_OTHER): Payer: Self-pay | Admitting: Family Medicine

## 2017-03-10 VITALS — BP 122/78 | HR 84 | Temp 98.9°F | Wt 209.0 lb

## 2017-03-10 DIAGNOSIS — M792 Neuralgia and neuritis, unspecified: Secondary | ICD-10-CM

## 2017-03-10 DIAGNOSIS — F32A Depression, unspecified: Secondary | ICD-10-CM

## 2017-03-10 DIAGNOSIS — R202 Paresthesia of skin: Secondary | ICD-10-CM

## 2017-03-10 DIAGNOSIS — R200 Anesthesia of skin: Secondary | ICD-10-CM

## 2017-03-10 DIAGNOSIS — F329 Major depressive disorder, single episode, unspecified: Secondary | ICD-10-CM

## 2017-03-10 MED ORDER — CITALOPRAM HYDROBROMIDE 40 MG PO TABS: 40.0000 mg | ORAL_TABLET | Freq: Every day | ORAL | 3 refills | Status: DC

## 2017-03-10 MED ORDER — AMITRIPTYLINE HCL 100 MG PO TABS: 100.0000 mg | ORAL_TABLET | Freq: Every day | ORAL | 0 refills | Status: DC

## 2017-03-10 NOTE — Patient Instructions (Addendum)
I have increased the amitripyline. Continue to take Ibuprofen as needed. If your pain worsens or your symptoms change, please contact our office. Please follow up with your PCP in 2 weeks.

## 2017-03-10 NOTE — Progress Notes (Signed)
Subjective: GX:QJJH pain HPI: Patient is a 46 y.o. female presenting to clinic today for a SDA for neck pain.  Pain starts in the middle/thoracic region and goes into neck. She has weakness in her arms. Sometimes she has numbness as well. No shooting pains down her arms.   She's been seen twice in clinic for aching and burning in hands bilaterally with concerns for possible cervical herniation. Imaging was ordered , however pt unable to obtain this due to lack of health coverage.  Today she is complaining more about her back pain than the numbness; currently denies numbness.  She feels amitripyline was helping but isn't any more. Ibuprofen has never helped.    She denies headache, fevers, chills, unilateral weakness or numbness, change in vision, urinary incontinence, bowel incontinence, or saddle paresthesias.   Social History: current smoker  ROS: All other systems reviewed and are negative.  Past Medical History Patient Active Problem List   Diagnosis Date Noted  . Numbness and tingling in both hands 01/29/2017  . Rosacea 10/14/2016  . Pain in joint, shoulder region 10/06/2016  . Mood disorder ([place]) 10/06/2016  . Tingling sensation 08/28/2016  . Rash and nonspecific skin eruption 12/27/2015  . Pain in finger of left hand 12/27/2015  . GERD (gastroesophageal reflux disease) 10/19/2015  . H/O bilateral oophorectomy 08/13/2015  . S/P laparoscopic assisted vaginal hysterectomy (LAVH) 07/11/2015  . FH: ovarian cancer in first degree relative 05/04/2015  . Back pain 03/15/2015  . Dermoid cyst of arm 12/19/2014  . Environmental allergies 09/30/2013  . Depression 01/06/2013  . GAD (generalized anxiety disorder) 01/06/2013  . Headache 11/24/2012  . Tobacco abuse 11/10/2012  . Overweight 11/10/2012  . Hypertension 11/10/2012    Medications- reviewed and updated  Objective: Office vital signs reviewed. BP 122/78   Pulse 84   Temp 98.9 F (37.2 C) (Oral)   Wt 209 lb  (94.8 kg)   LMP  (LMP Unknown) Comment: on [place]  BMI 35.87 kg/m    Physical Examination:  General: Awake, alert, well- nourished, NAD Back: normal to inspection, notes tenderness in the thoracic and cervical spine in the midline and paraspinal muscles. Negative Spurling. Sensation intact in the UE/hands bilaterally. Unable to abduct and raise arms above shoulder level. 5/5 strength in movements she's able to perform. Good/symmetric grip strength.  Brisk capillary refill.   Assessment/Plan: Numbness and tingling in both hands None currently but pt notes it is worsened since last visit. Neck pain seems stable on my exam based on previous notes. Spurling negative. No red flags on exam or history.  Previous DDx bilateral carpal tunnel (unusual presentation). 2. Central HNP. 3. MS. 4. Psychological overlay. 5. Metabolic (low vitamin B 12 can cause focal neuropathy but again very unusual distribution; rare but heavy metals poisoning? Potentially.  Will increase amitriptyline to 100mg .  Discussed importance of getting coverage ASAP so we can get imaging and possibly labs/possibly NCS.   No orders of the defined types were placed in this encounter.   Meds ordered this encounter  Medications  . amitriptyline (ELAVIL) 100 MG tablet    Sig: Take 1 tablet (100 mg total) by mouth at bedtime.    Dispense:  30 tablet    Refill:  0  . citalopram (CELEXA) 40 MG tablet    Sig: Take 1 tablet (40 mg total) by mouth daily.    Dispense:  90 tablet    Refill:  [place] PGY-3, [facility]

## 2017-03-11 NOTE — Assessment & Plan Note (Addendum)
None currently but pt notes it is worsened since last visit. Neck pain seems stable on my exam based on previous notes. Spurling negative. No red flags on exam or history.  Previous DDx bilateral carpal tunnel (unusual presentation). 2. Central HNP. 3. MS. 4. Psychological overlay. 5. Metabolic (low vitamin B 12 can cause focal neuropathy but again very unusual distribution; rare but heavy metals poisoning? Potentially.  Will increase amitriptyline to 100mg .  Discussed importance of getting coverage ASAP so we can get imaging and possibly labs/possibly NCS.

## 2017-03-25 ENCOUNTER — Encounter: Payer: Self-pay | Admitting: Internal Medicine

## 2017-03-25 ENCOUNTER — Ambulatory Visit (INDEPENDENT_AMBULATORY_CARE_PROVIDER_SITE_OTHER): Payer: Self-pay | Admitting: Internal Medicine

## 2017-03-25 VITALS — BP 138/92 | HR 94 | Temp 98.1°F | Ht 64.0 in | Wt 207.8 lb

## 2017-03-25 DIAGNOSIS — F39 Unspecified mood [affective] disorder: Secondary | ICD-10-CM

## 2017-03-25 DIAGNOSIS — M792 Neuralgia and neuritis, unspecified: Secondary | ICD-10-CM

## 2017-03-25 DIAGNOSIS — F411 Generalized anxiety disorder: Secondary | ICD-10-CM

## 2017-03-25 DIAGNOSIS — M542 Cervicalgia: Secondary | ICD-10-CM

## 2017-03-25 MED ORDER — CLONAZEPAM 0.5 MG PO TABS: 0.5000 mg | ORAL_TABLET | Freq: Two times a day (BID) | ORAL | 1 refills | Status: DC | PRN

## 2017-03-25 MED ORDER — AMITRIPTYLINE HCL 150 MG PO TABS: 150.0000 mg | ORAL_TABLET | Freq: Every day | ORAL | 1 refills | Status: DC

## 2017-03-25 NOTE — Patient Instructions (Signed)
Mariah Rice,  I have refilled your amitriptyline as 150 mg nightly.   Continue to try to take the klonopin only as needed.  I will refer you to psychiatry for second opinion on how to safely adjust your medications to improve mood and sleep.  Please return in 1-2 months to see how things are going. If you get [facility] Card access, please get the cervical spine imaging, and I will review the x-ray with you.  Best, Dr. Ola Spurr

## 2017-03-25 NOTE — Progress Notes (Signed)
[name] Progress Note  Subjective:  Mariah Rice is a 46 y.o. female with history of hyperthyroidism, anxiety, HTN, and neck pain who presents for follow-up neck pain and worsening anxiety.  #Neck pain: - Improved on increased amitriptyline dose. However, she has been taking two 75 mg tablets instead of the 100 mg prescribed at last visit. - Works at [employer] and has been accommodated with light duty, mostly folding cardboard boxes - Associated hand pain improved by wearing wrist braces at work and at night - Pain now limited to neck and upper back - Unable to get c-spine xrays due to lack of insurance and has not finalized [employer] coverage -- says she will call today ROS: No bowel or bladder incontinence, no sciatica   #Anxiety: - Worsening since grandmother, who is like a mother to patient, has been undergoing chemotherapy - Patient has increased stress balancing work and helping with doctor's appointments, though her job allows her to adjust her schedule to accompany her grandmother to chemotherapy sessions - 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 Has to wake up at 4:30 in the morning.  - Only drinks coffee in the morning. - Has 0.5 mg klonopin written for BID prn but has been taking TID whenever she feels overwhelmed or irritable but does not think it is doing very much - Also taking over the counter sleep medication without improvement - Has been on celexa 40 mg for years ROS: No SI/HI  Social: Current smoker; not ready to quit  Objective: Blood pressure (!) 138/92, pulse 94, temperature 98.1 F (36.7 C), temperature source Oral, height 5\' 4"  (1.626 m), weight 207 lb 12.8 oz (94.3 kg), SpO2 98 %. Body mass index is 35.67 kg/m. Constitutional: Obese female, pleasant, in NAD Cardiovascular: RRR,  S1, S2, no m/r/g.  Pulmonary/Chest: Effort normal and breath sounds normal. No respiratory distress.  Musculoskeletal: Midline TTP over upper c-spine with some increased muscle tension over shoulders. FROM of UEs.  Neurological: No sensory deficits of UEs at present.  Skin: Skin is warm and dry. No rash noted.  Psychiatric: Very talkative, somewhat anxious affect Vitals reviewed  Last TSH: 4.40 on 08/20/16  GAD 7 : Generalized Anxiety Score 03/25/2017 09/30/2016 08/27/2016  Nervous, Anxious, on Edge 3 3 3   Control/stop worrying 0 2 3  Worry too much - different things 3 2 3   Trouble relaxing 3 3 2   Restless 3 2 3   Easily annoyed or irritable 3 3 3   Afraid - awful might happen 0 3 1  Total GAD 7 Score 15 18 18   Anxiety Difficulty Very difficult Somewhat difficult Somewhat difficult    Depression screen [facility] 2/9 03/25/2017 03/25/2017 03/10/2017  Decreased Interest 0 0 0  Down, Depressed, Hopeless 0 0 0  PHQ - 2 Score 0 0 0  Altered sleeping 3 - -  Tired, decreased energy 1 - -  Change in appetite 0 - -  Feeling bad or failure about yourself  0 - -  Trouble concentrating 1 - -  Moving slowly or fidgety/restless 0 - -  Suicidal thoughts 0 - -  PHQ-9 Score 5 - -     Assessment/Plan: Neck pain - Improved on increased dose of amitriptyline. Less radiation into hands with wrist splints, so may have more than one process  at play -- muscle strain and or cervical DDD and carpal tunnel syndrome - Prescribed 150 mg tablets amitriptyline for next refill. - Recommended continued light duty at work for next 2 months, as anticipate continued delay in ability to obtain spine x-ray - Asked patient to have c-spine xray performed as soon as she gets [employer], given continued midline spinal tenderness over upper neck  Mood disorder ([place]) - GAD7 score consistent with severe anxiety, though suspect large situational component with grandmother's illness - Patient not interested in  counseling at this time; says she does not have time to attend sessions; did not have time to see [facility] today; this raises concerns as counseling would likely be helpful as patient continues to manage grief of losing her father 2 years ago and coping with grandmother's illness - Recommended trying to decrease how often she uses klonopin, taking only as needed. She requests xanax but told her she is already on a number of psychiatric medications that could interfere with each other; instead recommend tapering down on klonopin and seeing Psychiatry for help with medication management - Provided 2 months refills of klonopin -- should last patient through mid-June  Follow-up on neck pain once x-ray performed.  Olene Floss, MD [place], PGY-2

## 2017-03-26 ENCOUNTER — Encounter: Payer: Self-pay | Admitting: Internal Medicine

## 2017-03-26 ENCOUNTER — Telehealth: Payer: Self-pay | Admitting: Internal Medicine

## 2017-03-26 DIAGNOSIS — M542 Cervicalgia: Secondary | ICD-10-CM | POA: Insufficient documentation

## 2017-03-26 NOTE — Assessment & Plan Note (Addendum)
-   GAD7 score consistent with severe anxiety, though suspect large situational component with grandmother's illness - Patient not interested in counseling at this time; says she does not have time to attend sessions; did not have time to see [facility] today; this raises concerns as counseling would likely be helpful as patient continues to manage grief of losing her father 2 years ago and coping with grandmother's illness - Recommended trying to decrease how often she uses klonopin, taking only as needed. She requests xanax but told her she is already on a number of psychiatric medications that could interfere with each other; instead recommend tapering down on klonopin and seeing Psychiatry for help with medication management - Provided 2 months refills of klonopin -- should last patient through mid-June - Discussed progressive relaxation exercises to try for sleep difficulties

## 2017-03-26 NOTE — Assessment & Plan Note (Signed)
-   Improved on increased dose of amitriptyline. Less radiation into hands with wrist splints, so may have more than one process at play -- muscle strain and or cervical DDD and carpal tunnel syndrome - Prescribed 150 mg tablets amitriptyline for next refill. - Recommended continued light duty at work for next 2 months, as anticipate continued delay in ability to obtain spine x-ray - Asked patient to have c-spine xray performed as soon as she gets [employer], given continued midline spinal tenderness over upper neck

## 2017-03-26 NOTE — Telephone Encounter (Signed)
Called patient to let her know due to her lack of insurance she would need to see Psychiatry through [facility] and could not get referral through our office. Provided [facility]'s number, address, and hours of operation.   Please send patient a handout on Psychiatry/Counseling resources, as I did not give this to her at yesterday's appointment.  Thank you.   Olene Floss, MD [place], PGY-2

## 2017-03-28 NOTE — Telephone Encounter (Signed)
Psychiatry and counseling resources mail to patient address.

## 2017-04-17 ENCOUNTER — Other Ambulatory Visit: Payer: Self-pay | Admitting: Internal Medicine

## 2017-04-17 DIAGNOSIS — M791 Myalgia, unspecified site: Secondary | ICD-10-CM

## 2017-04-18 ENCOUNTER — Other Ambulatory Visit: Payer: Self-pay | Admitting: Internal Medicine

## 2017-04-18 DIAGNOSIS — M792 Neuralgia and neuritis, unspecified: Secondary | ICD-10-CM

## 2017-04-22 ENCOUNTER — Encounter: Payer: Self-pay | Admitting: Internal Medicine

## 2017-04-22 ENCOUNTER — Telehealth: Payer: Self-pay | Admitting: Internal Medicine

## 2017-04-22 ENCOUNTER — Ambulatory Visit (INDEPENDENT_AMBULATORY_CARE_PROVIDER_SITE_OTHER): Payer: Self-pay | Admitting: Internal Medicine

## 2017-04-22 VITALS — BP 160/100 | HR 83 | Temp 98.7°F | Ht 64.0 in | Wt 209.2 lb

## 2017-04-22 DIAGNOSIS — Z720 Tobacco use: Secondary | ICD-10-CM

## 2017-04-22 DIAGNOSIS — I10 Essential (primary) hypertension: Secondary | ICD-10-CM

## 2017-04-22 DIAGNOSIS — F39 Unspecified mood [affective] disorder: Secondary | ICD-10-CM

## 2017-04-22 MED ORDER — ALPRAZOLAM 0.5 MG PO TABS: 0.5000 mg | ORAL_TABLET | Freq: Every evening | ORAL | 0 refills | Status: DC | PRN

## 2017-04-22 NOTE — Telephone Encounter (Signed)
2nd request. ep °

## 2017-04-22 NOTE — Progress Notes (Signed)
Dr. Ola Spurr requested a [place].   Presenting Issue:  Stress, mood, poor sleep  Report of symptoms:  Patient states that she has had long history of mental health difficulties. Patient reports that she was diagnosed with bipolar disorder "almost 25 years ago" at [facility] (patient thinks it may have been called the [facility] at that time). Lately she has felt stressed due to grandmother (who is like mother to her) undergoing chemotherapy and stress/conflict with her female partner, who she is thinking of leaving. She has not been sleeping well and has had to start working third shift. Patient states she might sleep for 3-4 hours and feels exhausted all the time. She feels that for the most part she can "fight her demons" but that the stress has been overwhelming lately and she has been feeling irritable. Patient denied past or present SI. States she has a best friend who is a lot like her and who she talks to often.   Psychiatric History - Diagnoses: per patient, Bipolar disorder, depression, anxiety - Hospitalizations: patient states she was hospitalized once. States she went to [facility] in [place] and told them she was an alcoholic and needed to be admitted (though patient states she was not, but was depressed and lied about being an alcoholic because she did not want to be in the mental health wing) - Pharmacotherapy: Patient states she has been tried on many medications (she named Prozac, Zoloft, Latuda), but does not feel any have been effective - Outpatient therapy: has been to [facility] but I did not clarify what services she received there. Patient is not amenable to returning to [facility] and does not feel she received good care there  Family history of psychiatric issues:  Not assessed  Current and history of substance use:  Not assessed  Assessment / Plan / Recommendations: Patient reports feeling frustrated that her medications haven't been helping her  depression and anxiety. She is open to therapy and medication management though states she will not go to [place]. May be appropriate mood clinic referral, and is also open to meeting with a [facility], however she states that she is very busy with taking her grandmother to appointments as well as helping her around the house and working her job. Today she could not stay too long because she needed to pick her grandmother up from an appointment. I let patient know that I will discuss with Dr. Gwenlyn Saran and see if there are mood clinic openings and if this may be a good fit. Told patient that somebody (either me or Dr. Gwenlyn Saran, likely) would be in touch with her about a plan moving forward. Patient was agreeable to this.   Warmhandoff:    Warm Hand Off Completed.

## 2017-04-22 NOTE — Telephone Encounter (Signed)
Gave patient note for light lifting so should be okay. Please follow-up in about 2 weeks for BP recheck.

## 2017-04-22 NOTE — Progress Notes (Signed)
[name] Progress Note  Subjective:  Mariah Rice is a 46 y.o. female with history of depression and anxiety (possible bipolar disorder), HTN, and hypothyroidism who presents for mood complaints.   #Mood problems: - Stressors of grandmother undergoing chemotherapy, recent breakup with long-term female partner, not enjoying her job and having continued hand pain are building up and bothering patient more and more. - She reports having a shorter fuse and being more irritable as her biggest complaint.  - Has trouble sleeping due to anxiety most nights. Klonopin does not help. Has weaned down to several times a week instead of TID use, as discussed at last visit. - Was suspended from work last week for wearing a lanyard and got angry about this but says there was no altercation, just an Public relations account executive - Would like to switch off celexa 40 mg, which she has been on for years, as no longer thinks is helping. Has seen ads for rexulti and trintellix and is wondering if those might work better for her. - Would like to restart xanax because it helped her in the past when she felt like she was going to have a panic attack  - Has tried prozac, paxil, lexapro, lamictal, and latuda in the past - On amitriptyline since 01/29/17 for hand pain with tingling and numbness (has not yet had xray of back due to insurance issues)  ROS: No SI/HI, no chest pain  #HTN: - Takes triamterene-hctz 37.5-25 mg daily  Social: Current smoker but not interested in quitting at this time.   Objective: Blood pressure (!) 160/100, pulse 83, temperature 98.7 F (37.1 C), temperature source Oral, weight 209 lb 3.2 oz (94.9 kg), SpO2 97 %. Repeat blood pressure 155/100. Did take maxide today around 4 am. Body mass index is 35.91 kg/m. Constitutional: Obese female, tearful Cardiovascular: RRR, S1, S2, no m/r/g.  Pulmonary/Chest: Effort normal and breath sounds normal. No respiratory distress.  Neurological:  AOx3, no focal deficits.  Psychiatric: Anxious affect and intermittently crying Vitals reviewed  Depression screen [facility] 2/9 04/22/2017 03/25/2017 03/25/2017  Decreased Interest 0 0 0  Down, Depressed, Hopeless 1 0 0  PHQ - 2 Score 1 0 0  Altered sleeping 3 3 -  Tired, decreased energy 3 1 -  Change in appetite 1 0 -  Feeling bad or failure about yourself  3 0 -  Trouble concentrating 3 1 -  Moving slowly or fidgety/restless 1 0 -  Suicidal thoughts 1 0 -  PHQ-9 Score 16 5 -  Some recent data might be hidden   GAD 7 : Generalized Anxiety Score 04/22/2017 03/25/2017 09/30/2016 08/27/2016  Nervous, Anxious, on Edge 3 3 3 3   Control/stop worrying 3 0 2 3  Worry too much - different things 3 3 2 3   Trouble relaxing 3 3 3 2   Restless 3 3 2 3   Easily annoyed or irritable 3 3 3 3   Afraid - awful might happen 3 0 3 1  Total GAD 7 Score 21 15 18 18   Anxiety Difficulty Extremely difficult Very difficult Somewhat difficult Somewhat difficult    Assessment/Plan: Mood disorder (HCC) - Scores of PHQ-9 and GAD-7 both in severe range.  - Agree with medication change from celexa but recommend patient be seen by [facility] with Dr. Gwenlyn Saran and Dr. Tammi Klippel, as do not think other SSRIs patient suggests will add much to her regimen. Had suggested she go to [facility] after last visit, but patient has seen them before and  does not want to go back.  - Asked Sonia Baller with [facility] to introduce services available at [facility] to patient prior to her leaving - Think patient would benefit from counseling, as a lot of her mood swings seem related to life stressors - Provided xanax 0.5 mg, #5 tablets for panic attack but counseled patient this is not a long-term solution  Hypertension - Not at goal today. 5 out of 13 BP readings have been high over the past year. Unclear how much stress is impacting her values. - Asked patient to try to take some measurements outside the office at her local pharmacy. Let her know goal is < 140/90. If  continues to be elevated, likely would double her maxide. - Could consider ambulatory BP monitoring  Tobacco abuse - Not interested in quitting at this time.   Follow-up with [facility]. Sonia Baller to leave message with Dr. Gwenlyn Saran.  Left message for Kennyth Lose to help patient get appointment for orange card.   Olene Floss, MD [place], PGY-2

## 2017-04-22 NOTE — Patient Instructions (Signed)
Mariah Rice,  I'm sorry you have so much stress, and I am hopeful that meeting with our behavioral health specialist and psychologist will help you find the next best management plan, including trying a new antidepressant.  I have prescribed 5 tablets of xanax, though I do not think this will be as helpful as making long-term medication change and trying counseling.  Please take your blood pressure over the next month. If your readings remain about 140/90, call our office, and I can change your medication.  It would be good to follow-up in about 2-4 weeks to see how your blood pressure is doing.  Best, Dr. Ola Spurr

## 2017-04-22 NOTE — Telephone Encounter (Signed)
Pt took her blood pressure at [facility] as instructed by dr fitzgerald.  It was 175/105.  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 She goes to work at 21 today.  If dr wants to take her out today,. She will need a letter.  Please advise

## 2017-04-23 ENCOUNTER — Encounter: Payer: Self-pay | Admitting: Internal Medicine

## 2017-04-23 NOTE — Telephone Encounter (Signed)
See mychart message from patient.

## 2017-04-23 NOTE — Telephone Encounter (Signed)
Replied to patient in [place] and also left her a phone message to double her maxide dose. Provided work note through [employer] and also left paper copy up front. Recommended she make an appointment for BP recheck--can be nurse visit or doctor's visit.  Olene Floss, MD [place], PGY-2

## 2017-04-24 ENCOUNTER — Encounter: Payer: Self-pay | Admitting: Internal Medicine

## 2017-04-24 NOTE — Assessment & Plan Note (Addendum)
-   Scores of PHQ-9 and GAD-7 both in severe range.  - Agree with medication change from celexa but recommend patient be seen by [facility] with Dr. Gwenlyn Saran and Dr. Tammi Klippel, as do not think other SSRIs patient suggests will add much to her regimen. Had suggested she go to [facility] after last visit, but patient has seen them before and does not want to go back.  - Asked Sonia Baller with [facility] to introduce services available at [facility] to patient prior to her leaving - Think patient would benefit from counseling, as a lot of her mood swings seem related to life stressors - Provided xanax 0.5 mg, #5 tablets for panic attack but counseled patient this is not a long-term solution

## 2017-04-24 NOTE — Assessment & Plan Note (Addendum)
-   Not at goal today. 5 out of 13 BP readings have been high over the past year. Unclear how much stress is impacting her values. - Asked patient to try to take some measurements outside the office at her local pharmacy. Let her know goal is < 140/90. If continues to be elevated, likely would double her maxide. - Could consider ambulatory BP monitoring

## 2017-04-24 NOTE — Assessment & Plan Note (Signed)
Not interested in quitting at this time

## 2017-04-25 ENCOUNTER — Encounter: Payer: Self-pay | Admitting: Internal Medicine

## 2017-04-26 ENCOUNTER — Other Ambulatory Visit: Payer: Self-pay | Admitting: Internal Medicine

## 2017-04-28 ENCOUNTER — Other Ambulatory Visit: Payer: Self-pay | Admitting: Internal Medicine

## 2017-04-28 DIAGNOSIS — M792 Neuralgia and neuritis, unspecified: Secondary | ICD-10-CM

## 2017-05-01 ENCOUNTER — Ambulatory Visit: Payer: Self-pay

## 2017-05-08 ENCOUNTER — Ambulatory Visit: Payer: Self-pay

## 2017-05-13 ENCOUNTER — Encounter: Payer: Self-pay | Admitting: Family Medicine

## 2017-05-13 ENCOUNTER — Ambulatory Visit (INDEPENDENT_AMBULATORY_CARE_PROVIDER_SITE_OTHER): Payer: Self-pay | Admitting: Family Medicine

## 2017-05-13 ENCOUNTER — Ambulatory Visit: Payer: Self-pay

## 2017-05-13 VITALS — BP 146/80 | HR 78 | Temp 98.6°F | Ht 64.0 in | Wt 212.6 lb

## 2017-05-13 DIAGNOSIS — W57XXXA Bitten or stung by nonvenomous insect and other nonvenomous arthropods, initial encounter: Secondary | ICD-10-CM

## 2017-05-13 DIAGNOSIS — S30861A Insect bite (nonvenomous) of abdominal wall, initial encounter: Secondary | ICD-10-CM

## 2017-05-13 DIAGNOSIS — L299 Pruritus, unspecified: Secondary | ICD-10-CM | POA: Insufficient documentation

## 2017-05-13 HISTORY — DX: Bitten or stung by nonvenomous insect and other nonvenomous arthropods, initial encounter: W57.XXXA

## 2017-05-13 HISTORY — DX: Insect bite (nonvenomous) of abdominal wall, initial encounter: S30.861A

## 2017-05-13 NOTE — Progress Notes (Signed)
   Subjective:   Mariah Rice is a 46 y.o. female with a history of HTN, GERD, anxiety, depression, tobacco abuse here for same day appt for  Chief Complaint  Patient presents with  . Insect Bite    Patient reports 2 tick bites about 3 weeks ago to L lower leg Had a bruise, no rash at site of bite Was able to remove ticks when noticed Has been feeling malaise and fatigue and BP elevated after bites No fevers  Review of Systems:  Per HPI.   Social History: Current smoker  Objective:  BP (!) 146/80   Pulse 78   Temp 98.6 F (37 C) (Oral)   Ht 5\' 4"  (1.626 m)   Wt 212 lb 9.6 oz (96.4 kg)   LMP  (LMP Unknown) Comment: on Megace  SpO2 96%   BMI 36.49 kg/m   Gen:  46 y.o. female in NAD HEENT: NCAT, MMM, anicteric sclerae CV: Reg rate Resp: Non-labored Ext: WWP, no edema MSK: 2 scabbed areas without surrounding erythema on medial L lower leg just distal to knee Neuro: Alert and oriented, speech normal     Assessment & Plan:     Mariah Rice is a 46 y.o. female here for   Tick bite No surrounding erythema, no rashes, no fevers Discussed that without symptoms of infection, there is no need to treat after a tick bite Patient is also 3 weeks out from her tick bite and showing no signs or symptoms of infection Discussed return precautions   Virginia Crews, MD MPH PGY-3,  [place] [facility] 05/13/2017  10:21 AM  ;

## 2017-05-13 NOTE — Assessment & Plan Note (Signed)
No surrounding erythema, no rashes, no fevers Discussed that without symptoms of infection, there is no need to treat after a tick bite Patient is also 3 weeks out from her tick bite and showing no signs or symptoms of infection Discussed return precautions

## 2017-05-13 NOTE — Patient Instructions (Signed)
Tick Bite Information Introduction Ticks are insects that attach themselves to the skin. There are many types of ticks. Common types include wood ticks and deer ticks. Sometimes, ticks carry diseases that can make a person very ill. The most common places for ticks to attach themselves are the scalp, neck, armpits, waist, and groin. HOW CAN YOU PREVENT TICK BITES? Take these steps to help prevent tick bites when you are outdoors:  Wear long sleeves and long pants.  Wear white clothes so you can see ticks more easily.  Tuck your pant legs into your socks.  If walking on a trail, stay in the middle of the trail to avoid brushing against bushes.  Avoid walking through areas with long grass.  Put bug spray on all skin that is showing and along boot tops, pant legs, and sleeve cuffs.  Check clothes, hair, and skin often and before going inside.  Brush off any ticks that are not attached.  Take a shower or bath as soon as possible after being outdoors. HOW SHOULD YOU REMOVE A TICK? Ticks should be removed as soon as possible to help prevent diseases. 1. If latex gloves are available, put them on before trying to remove a tick. 2. Use tweezers to grasp the tick as close to the skin as possible. You may also use curved forceps or a tick removal tool. Grasp the tick as close to its head as possible. Avoid grasping the tick on its body. 3. Pull gently upward until the tick lets go. Do not twist the tick or jerk it suddenly. This may break off the tick's head or mouth parts. 4. Do not squeeze or crush the tick's body. This could force disease-carrying fluids from the tick into your body. 5. After the tick is removed, wash the bite area and your hands with soap and water or alcohol. 6. Apply a small amount of antiseptic cream or ointment to the bite site. 7. Wash any tools that were used. Do not try to remove a tick by applying a hot match, petroleum jelly, or fingernail polish to the tick. These  methods do not work. They may also increase the chances of disease being spread from the tick bite. WHEN SHOULD YOU SEEK HELP? Contact your health care provider if you are unable to remove a tick or if a part of the tick breaks off in the skin. After a tick bite, you need to watch for signs and symptoms of diseases that can be spread by ticks. Contact your health care provider if you develop any of the following:  Fever.  Rash.  Redness and puffiness (swelling) in the area of the tick bite.  Tender, puffy lymph glands.  Watery poop (diarrhea).  Weight loss.  Cough.  Feeling more tired than normal (fatigue).  Muscle, joint, or bone pain.  Belly (abdominal) pain.  Headache.  Change in your level of consciousness.  Trouble walking or moving your legs.  Loss of feeling (numbness) in the legs.  Loss of movement (paralysis).  Shortness of breath.  Confusion.  Throwing up (vomiting) many times. This information is not intended to replace advice given to you by your health care provider. Make sure you discuss any questions you have with your health care provider. Document Released: 02/26/2010 Document Revised: 05/09/2016 Document Reviewed: 05/12/2013 Elsevier Interactive Patient Education  2017 Elsevier Inc.  

## 2017-05-14 ENCOUNTER — Encounter: Payer: Self-pay | Admitting: Internal Medicine

## 2017-05-14 ENCOUNTER — Ambulatory Visit (INDEPENDENT_AMBULATORY_CARE_PROVIDER_SITE_OTHER): Payer: Self-pay | Admitting: Internal Medicine

## 2017-05-14 DIAGNOSIS — F39 Unspecified mood [affective] disorder: Secondary | ICD-10-CM

## 2017-05-14 NOTE — Assessment & Plan Note (Signed)
Currently not on medication. Discussed with patient that as she has already tried and reportedly failed so many medications, referring her to a specialist is the best care for her at this time. D[facility]ed [facility] with Dr. Gwenlyn Saran and Dr. Tammi Klippel for medication control. Also discussed meeting with Sonia Baller to develop coping mechanisms for her current life stressors. Patient said she would consider scheduling appointments. I provided her with Dr. Tod Persia number and discussed the case with Dr. Gwenlyn Saran personally. Also provided patient with number for [facility] in case she would prefer to receive her care there, although she says she has been seen there before and would prefer not to return.  - Patient to schedule appt with Dr. Gwenlyn Saran and with Sonia Baller

## 2017-05-14 NOTE — Patient Instructions (Addendum)
It was nice meeting you today Ms. Pincus!  At this point, since you have already been tried on so many medications that are not helping you, I think specialized services will provide you with the best care. To help you come up with methods to cope with the stressors in your life, please schedule an appointment with Sonia Baller, our behavioral health counselor. To help with beginning a new medication, please schedule an appointment at our [facility] with Dr. Gwenlyn Saran (our psychologist), and Dr. Tammi Klippel (our psychiatrist). You can call Dr. Gwenlyn Saran at [phone], and she can schedule an appointment both with Sonia Baller and at the [facility] for you. You can also go to [facility] if you would prefer. Their number is [phone].   If you have any questions or concerns, please feel free to call the clinic.   Be well,  Dr. Avon Gully

## 2017-05-14 NOTE — Progress Notes (Signed)
   Subjective:   Patient: Mariah Rice       Birthdate: [birth year]       MRN: [id number]      HPI  Mariah Rice is a 46 y.o. female presenting for depression and anxiety f/u.   Depression/anxiety Patient recently seen by Dr. Ola Spurr at [facility] on 05/08 for this problem. At that time, she reported that she has been on multiple medications in the past (Prozac, Paxil, Lexapro, lamictal, Latuda, and Celexa) with no improvement in symptoms. She asked to be restarted on Xanax at that appointment, because that was all she felt helped her. Also showed interested in [place] and Trintellix after seeing ads on TV. Patient was introduced to [place] with [facility] and encouraged to schedule an appt at [facility] with Dr. Gwenlyn Saran, however did not follow through with either of these things. She was also encouraged to seek care at [facility] but declined. Patient was given five Xanax tablets and declined to be started on any other medication.  She returns today reporting no improvement in symptoms. Is currently only taking Xanax, though has run out of pills as she was only prescribed five tablets at last visit. She says that she is still very irritable and has "no filter," meaning that she says exactly what she is feeling. She says she has not always been this way. Also notes that she is crying more than usual. Denies HI/SI. She reports her mother's cancer and her current unemployment as the two major stressors in her life.  She feels that she needs medication and is frustrated that she was not prescribed medication at her last visit.   Smoking status reviewed. Patient is current every day smoker.   Review of Systems See HPI.     Objective:  Physical Exam  Constitutional: She is oriented to person, place, and time and well-developed, well-nourished, and in no distress.  HENT:  Head: Normocephalic and atraumatic.  Pulmonary/Chest: Effort normal. No respiratory distress.  Neurological: She is alert and oriented  to person, place, and time.  Psychiatric: Judgment normal.  Tearful when discussing her mother's cancer.       Assessment & Plan:  Mood disorder ([place]) Currently not on medication. Discussed with patient that as she has already tried and reportedly failed so many medications, referring her to a specialist is the best care for her at this time. Discussed [facility] with Dr. Gwenlyn Saran and Dr. Tammi Klippel for medication control. Also discussed meeting with Sonia Baller to develop coping mechanisms for her current life stressors. Patient said she would consider scheduling appointments. I provided her with Dr. Tod Persia number and discussed the case with Dr. Gwenlyn Saran personally. Also provided patient with number for [facility] in case she would prefer to receive her care there, although she says she has been seen there before and would prefer not to return.  - Patient to schedule appt with Dr. Gwenlyn Saran and with Sonia Baller - Precepted with Dr. Gwenlyn Saran.   Adin Hector, MD, MPH PGY-2 [place] [facility] Pager [phone]

## 2017-05-18 ENCOUNTER — Encounter: Payer: Self-pay | Admitting: Internal Medicine

## 2017-05-19 ENCOUNTER — Telehealth: Payer: Self-pay | Admitting: Internal Medicine

## 2017-05-19 DIAGNOSIS — F329 Major depressive disorder, single episode, unspecified: Secondary | ICD-10-CM

## 2017-05-19 DIAGNOSIS — Z9109 Other allergy status, other than to drugs and biological substances: Secondary | ICD-10-CM

## 2017-05-19 DIAGNOSIS — L719 Rosacea, unspecified: Secondary | ICD-10-CM

## 2017-05-19 DIAGNOSIS — M791 Myalgia, unspecified site: Secondary | ICD-10-CM

## 2017-05-19 DIAGNOSIS — M792 Neuralgia and neuritis, unspecified: Secondary | ICD-10-CM

## 2017-05-19 DIAGNOSIS — F32A Depression, unspecified: Secondary | ICD-10-CM

## 2017-05-19 DIAGNOSIS — I10 Essential (primary) hypertension: Secondary | ICD-10-CM

## 2017-05-19 DIAGNOSIS — Z90722 Acquired absence of ovaries, bilateral: Secondary | ICD-10-CM

## 2017-05-19 MED ORDER — ESTRADIOL 2 MG PO TABS: 2.0000 mg | ORAL_TABLET | Freq: Every day | ORAL | 3 refills | Status: DC

## 2017-05-19 MED ORDER — IBUPROFEN 800 MG PO TABS: 800.0000 mg | ORAL_TABLET | Freq: Two times a day (BID) | ORAL | 6 refills | Status: DC | PRN

## 2017-05-19 MED ORDER — FLUTICASONE PROPIONATE 50 MCG/ACT NA SUSP: 2.0000 | Freq: Every day | NASAL | 11 refills | Status: DC

## 2017-05-19 MED ORDER — AMITRIPTYLINE HCL 150 MG PO TABS: 150.0000 mg | ORAL_TABLET | Freq: Every day | ORAL | 3 refills | Status: DC

## 2017-05-19 MED ORDER — CITALOPRAM HYDROBROMIDE 40 MG PO TABS: 40.0000 mg | ORAL_TABLET | Freq: Every day | ORAL | 3 refills | Status: DC

## 2017-05-19 MED ORDER — TRIAMTERENE-HCTZ 37.5-25 MG PO TABS: 1.0000 | ORAL_TABLET | Freq: Every day | ORAL | 3 refills | Status: DC

## 2017-05-19 MED ORDER — AZELAIC ACID 15 % EX FOAM: 1.0000 | Freq: Two times a day (BID) | CUTANEOUS | 1 refills | Status: DC

## 2017-05-19 NOTE — Telephone Encounter (Signed)
Pt called because she now has the orange card and needs all new prescription faxed to [facility] so that she can get her medications. They need to be faxed since they do not take transfers from other pharmacies. jw

## 2017-05-19 NOTE — Telephone Encounter (Signed)
Placed prescriptions for amitriptyline, celexa, estradiol, flonase, ibuprofen, maxide and azelaic acid in fax pile to go to [place]. Xanax was not a long-term prescription and not appropriate to refill.

## 2017-05-23 ENCOUNTER — Encounter: Payer: Self-pay | Admitting: Internal Medicine

## 2017-05-23 ENCOUNTER — Ambulatory Visit (INDEPENDENT_AMBULATORY_CARE_PROVIDER_SITE_OTHER): Payer: Self-pay | Admitting: Internal Medicine

## 2017-05-23 VITALS — BP 140/92 | HR 107 | Temp 98.1°F | Wt 213.0 lb

## 2017-05-23 DIAGNOSIS — M542 Cervicalgia: Secondary | ICD-10-CM

## 2017-05-23 MED ORDER — TIZANIDINE HCL 4 MG PO CAPS: 4.0000 mg | ORAL_CAPSULE | Freq: Every day | ORAL | 1 refills | Status: DC

## 2017-05-23 NOTE — Patient Instructions (Signed)
Please try swimming as discussed. Follow up as needed in about two weeks if no improvement. Please try the Zanaflex at night for neck pain

## 2017-05-23 NOTE — Assessment & Plan Note (Signed)
Possibly due to muscular skeletal issues with neck and shoulders, less likely cervical radiculopathy though patient does indicate having tingling at times in her fingers. - DG Cervical Spine 2 or 3 views; Future - tiZANidine (ZANAFLEX) 4 MG capsule; Take 1 capsule (4 mg total) by mouth at bedtime.  Dispense: 30 capsule; Refill: 1 to help with sleep at night  - Neck exercises prescribed, swimming to loosen muscles some  - Follow up in two weeks
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
## 2017-05-26 ENCOUNTER — Telehealth: Payer: Self-pay | Admitting: *Deleted

## 2017-05-26 DIAGNOSIS — M545 Low back pain, unspecified: Secondary | ICD-10-CM

## 2017-05-26 MED ORDER — TIZANIDINE HCL 4 MG PO TABS: 4.0000 mg | ORAL_TABLET | Freq: Four times a day (QID) | ORAL | 0 refills | Status: AC | PRN

## 2017-05-26 NOTE — Telephone Encounter (Signed)
Pharmacy requesting rx for tizanidine be changed to tablets since patient has no insurance and tablets are cheaper.

## 2017-05-26 NOTE — Telephone Encounter (Signed)
I have sent in prescription with tablets. Please let patient know

## 2017-05-29 ENCOUNTER — Telehealth: Payer: Self-pay | Admitting: Psychology

## 2017-05-29 NOTE — Telephone Encounter (Signed)
Thank you, Dr. Gwenlyn Saran. She has refills through September of celexa. She does not think this is helping any more, but I had not suggested she come off of it yet, as we had not chosen an alternative to start.

## 2017-05-29 NOTE — Telephone Encounter (Signed)
Patient called to request an appointment.  She says she is seen at [facility] and is not pleased with their care.  Reports diagnoses include bipolar disorder, anxiety, depression, and ptsd.  I described the clinic and our approach.  First available is not until August 15th.  She scheduled for 11:00.  She says she will run out of medicine.  I encouraged her to contact the person that prescribed the medicine that she is currently taking to see if she can refills until her appointment.  I told her the following: -  If she is unable to make the appointment she needs to call me. -  If she misses the appointment without a phone call, I won't be able to schedule her back in my clinic. She voiced an understanding and was able to repeat the appointment date and time back to me.

## 2017-06-09 ENCOUNTER — Ambulatory Visit (INDEPENDENT_AMBULATORY_CARE_PROVIDER_SITE_OTHER): Payer: Self-pay | Admitting: Student

## 2017-06-09 ENCOUNTER — Encounter: Payer: Self-pay | Admitting: Student

## 2017-06-09 VITALS — BP 128/80 | HR 80 | Temp 97.6°F | Ht 64.0 in | Wt 217.6 lb

## 2017-06-09 DIAGNOSIS — M7989 Other specified soft tissue disorders: Secondary | ICD-10-CM | POA: Insufficient documentation

## 2017-06-09 NOTE — Progress Notes (Signed)
   Subjective:    Patient ID: Mariah Rice, female    DOB: 02-12-1971, 46 y.o.   MRN: [id number]   CC: hand and foot swelling  HPI: 46 y/o F presents for hand and foot swelling  Hand and foot swelling - noted this AM upon waking - she has had swelling like this in past in her feet which resolved with foot elevation - no SOB or chest pain - she was at the beach yesterday and the day before and exposed to a lot of sun   Smoking status reviewed Daily smoker  Review of Systems  Per HPI, else denies recent illness, fever,   Objective:  BP 128/80   Pulse 80   Temp 97.6 F (36.4 C) (Oral)   Ht 5\' 4"  (1.626 m)   Wt 217 lb 9.6 oz (98.7 kg)   LMP  (LMP Unknown) Comment: on Megace  SpO2 98%   BMI 37.35 kg/m  Vitals and nursing note reviewed  General: NAD Cardiac: RRR Respiratory: CTAB, normal effort Extremities: 0 to trace pedal edema, no hand swelling or tenderness, no calf tenderness or erythema,  WWP. Skin: warm and dry, no rashes noted, very tanned skin througout Neuro: alert and oriented, no focal deficits   Assessment & Plan:    Hand and foot swelling - No hand or foot swelling on exam - intermittent foot swelling otherwise appears to be chronic due to dependent edema - will try compression stocking and foot elevation - follow as needed  Alyssa A. Lincoln Brigham MD, [facility] Resident PGY-3 Pager [phone]

## 2017-06-09 NOTE — Assessment & Plan Note (Signed)
No hand or foot swelling on exam - intermittent foot swelling otherwise appears to be chronic due to dependent edema - will try compression stocking and foot elevation - follow as needed

## 2017-06-09 NOTE — Patient Instructions (Signed)
Follow up with PCP as needed Try compression stockings for leg swelling and elevation Call the office with questions or concerns

## 2017-07-28 NOTE — Telephone Encounter (Signed)
Patient called front office to cancel her [facility] Appointment for this Wednesday.  She told front office staff that she was getting care at [facility].  Will let her PCP know.

## 2017-07-30 ENCOUNTER — Ambulatory Visit: Payer: Self-pay | Admitting: Psychology

## 2017-07-31 ENCOUNTER — Ambulatory Visit: Payer: Self-pay | Admitting: Internal Medicine

## 2017-08-04 ENCOUNTER — Ambulatory Visit (HOSPITAL_COMMUNITY)
Admission: EM | Admit: 2017-08-04 | Discharge: 2017-08-04 | Disposition: A | Discharge status: Home / Self Care | Payer: Self-pay | Attending: Family Medicine | Admitting: Family Medicine

## 2017-08-04 ENCOUNTER — Encounter (HOSPITAL_COMMUNITY): Payer: Self-pay | Admitting: Emergency Medicine

## 2017-08-04 DIAGNOSIS — M5410 Radiculopathy, site unspecified: Secondary | ICD-10-CM

## 2017-08-04 DIAGNOSIS — M7581 Other shoulder lesions, right shoulder: Secondary | ICD-10-CM

## 2017-08-04 MED ORDER — PREDNISONE 10 MG (21) PO TBPK: ORAL_TABLET | Freq: Every day | ORAL | 0 refills | Status: DC

## 2017-08-04 MED ORDER — GABAPENTIN 300 MG PO CAPS: 300.0000 mg | ORAL_CAPSULE | Freq: Three times a day (TID) | ORAL | 0 refills | Status: DC

## 2017-08-04 NOTE — ED Provider Notes (Signed)
[place]   [id number] 08/04/17 Arrival Time: 4008   SUBJECTIVE:  Mariah Rice is a 46 y.o. female who presents to the urgent care  with complaint of right shoulder pain, tingling extending down her right arm. Denies any loss of sensation. No recent injury. States she woke up with this pain 3 days ago. She works in [employer], denies repetitive motion. No fever or chills, no unexpected weight loss, or other symptoms. She is left-handed.  ROS: As per HPI, remainder of ROS negative.   OBJECTIVE:  Vitals:   08/04/17 1525  BP: (!) 143/84  Pulse: 88  Temp: 97.9 F (36.6 C)  TempSrc: Oral  SpO2: 99%     General appearance: alert; no distress HEENT: normocephalic; atraumatic; conjunctivae normal;  Neck: Trachea midline no JVD Lungs: clear to auscultation bilaterally Heart: regular rate and rhythm Abdomen: soft, non-tender; bowel sounds normal; no masses or organomegaly; no guarding or rebound tenderness Musculoskeletal: Tenderness with palpation of the insertion of biceps tendon, tenderness along the infraspinatus, flexion of the shoulder limited to the 10 and extension limited to 30, unable to abduct due to pain and has pain with internal rotation. Sensory function remains intact distally, capillary refill less than 2 weeks Skin: warm and dry Neurologic: Grossly normal Psychological:  alert and cooperative; normal mood and affect     ASSESSMENT & PLAN:  1. Tendinitis of right rotator cuff   2. Radiculopathy of arm     Meds ordered this encounter  Medications  . DULoxetine (CYMBALTA) 20 MG capsule    Sig: Take 20 mg by mouth daily.  . LamoTRIgine (LAMICTAL PO)    Sig: Take 10 mg by mouth 2 (two) times daily.  . ARIPiprazole (ABILIFY) 30 MG tablet    Sig: Take 30 mg by mouth daily.  . hydrOXYzine (ATARAX/VISTARIL) 25 MG tablet    Sig: Take 25 mg by mouth 3 (three) times daily.  Marland Kitchen gabapentin (NEURONTIN) 300 MG capsule      Sig: Take 1 capsule (300 mg total) by mouth 3 (three) times daily.    Dispense:  90 capsule    Refill:  0    Order Specific Question:   Supervising Provider    Answer:   Robyn Haber [[id number]]  . predniSONE (STERAPRED UNI-PAK 21 TAB) 10 MG (21) TBPK tablet    Sig: Take by mouth daily. Take 6 tabs by mouth daily  for 2 days, then 5 tabs for 2 days, then 4 tabs for 2 days, then 3 tabs for 2 days, 2 tabs for 2 days, then 1 tab by mouth daily for 2 days    Dispense:  42 tablet    Refill:  0    Order Specific Question:   Supervising Provider    Answer:   Robyn Haber [[id number]]    Reviewed expectations re: course of current medical issues. Questions answered. Outlined signs and symptoms indicating need for more acute intervention. Patient verbalized understanding. After Visit Summary given.    Procedures:    Labs Reviewed - No data to display  No results found.  Allergies  Allergen Reactions  . Penicillins Nausea Only and Rash    Has patient had a PCN reaction causing immediate rash, facial/tongue/throat swelling, SOB or lightheadedness with hypotension: no Has patient had a PCN reaction causing severe rash involving mucus membranes or skin necrosis:unknown Has patient had a PCN reaction that required hospitalization : yes Has patient had a PCN reaction occurring within  the last 10 years: no If all of the above answers are "NO", then may proceed with Cephalosporin use.   . Sulfa Antibiotics Nausea Only and Rash    PMHx, SurgHx, SocialHx, Medications, and Allergies were reviewed in the Visit Navigator and updated as appropriate.       Barnet Glasgow, NP 08/04/17 [phone]

## 2017-08-04 NOTE — Discharge Instructions (Signed)
For your pain, started on gabapentin, take one tablet tonight at bedtime, one tablet twice a day tomorrow, then 1 tablet 3 times a day thereafter. This treats neuropathic pain. For your rotator cuff injury, prescribed a steroid, take as directed. If pain persists past 2-3 weeks, follow up with an orthopedist

## 2017-08-04 NOTE — ED Triage Notes (Signed)
Pt reports right shoulder pain that began Friday night.  She denies any injury to the shoulder.

## 2017-08-05 ENCOUNTER — Encounter: Payer: Self-pay | Admitting: Internal Medicine

## 2017-08-05 ENCOUNTER — Ambulatory Visit (INDEPENDENT_AMBULATORY_CARE_PROVIDER_SITE_OTHER): Payer: Self-pay | Admitting: Internal Medicine

## 2017-08-05 VITALS — BP 112/68 | HR 83 | Temp 98.7°F | Ht 64.0 in | Wt 215.4 lb

## 2017-08-05 DIAGNOSIS — L918 Other hypertrophic disorders of the skin: Secondary | ICD-10-CM | POA: Insufficient documentation

## 2017-08-05 MED ORDER — PRAZOSIN HCL 1 MG PO CAPS: 1.0000 mg | ORAL_CAPSULE | Freq: Every day | ORAL | Status: DC

## 2017-08-05 MED ORDER — LAMOTRIGINE 25 MG PO TABS: 25.0000 mg | ORAL_TABLET | Freq: Two times a day (BID) | ORAL | Status: DC

## 2017-08-05 MED ORDER — ARIPIPRAZOLE 10 MG PO TABS: 10.0000 mg | ORAL_TABLET | Freq: Every day | ORAL | Status: DC

## 2017-08-05 NOTE — Progress Notes (Signed)
[name] Progress Note  Subjective:  Mariah Rice is a 46 y.o. female who presents for skin tag removal. Patient has 4 skin tags that have been present for years but are painful when her necklaces rubs against them. She would like these removed. She denies much growth of the areas. Has not noted changes in color or bleeding.  ROS: No fevers, no rashes  In addition, patient reports improvement in mood now that she is seeing [facility]. She is now on abilify 10 mg, minipress 1 mg, lamictal 25 mg BID and is to follow-up with them this Thursday.   Patient seen by Urgent Care yesterday and started on steroid taper for R shoulder tendinitis. Patient asked about getting a steroid shot today but advised to complete steroid taper.   Allergies  Allergen Reactions  . Penicillins Nausea Only and Rash    Has patient had a PCN reaction causing immediate rash, facial/tongue/throat swelling, SOB or lightheadedness with hypotension: no Has patient had a PCN reaction causing severe rash involving mucus membranes or skin necrosis:unknown Has patient had a PCN reaction that required hospitalization : yes Has patient had a PCN reaction occurring within the last 10 years: no If all of the above answers are "NO", then may proceed with Cephalosporin use.   . Sulfa Antibiotics Nausea Only and Rash   Social: Current smoker, not yet ready to quit  Objective: Blood pressure 112/68, pulse 83, temperature 98.7 F (37.1 C), temperature source Oral, height 5\' 4"  (1.626 m), weight 215 lb 6.4 oz (97.7 kg), SpO2 97 %. Constitutional: Obese female, in NAD Pulmonary/Chest: Effort normal. Abdominal: Soft. +BS, no TTP, no hernia  Skin: Skin is warm and dry. 5 small, light brown skin tags 1-2 mm in size across patient's upper chest/neck.   Psychiatric: Normal mood and affect.  Vitals reviewed  Assessment/Plan: Procedure note: skin biopsy Written informed consent received. Area was cleaned with  Betadine and alcohol swabs, then injected with a total of 1.5 cm 2% lidocaine with epinephrine.  Good anesthesia achieved.  The 4 largest skin tags were removed by scalpel. Largest from L lateral neck was sent for biopsy. Hemostasis achieved promptly with pressure. Area covered with antibiotic ointment and then bandaid. Patient was advised to keep area clean and dry and change dressing daily for the next few days to prevent infection.   Skin tag - Removed skin tags without complication as noted above. - Will follow-up pathology results.   Follow-up prn.  Olene Floss, MD [place], PGY-3

## 2017-08-05 NOTE — Assessment & Plan Note (Signed)
-   Removed skin tags without complication as noted above. - Will follow-up pathology results.

## 2017-08-05 NOTE — Patient Instructions (Signed)
Mariah Rice,  Please return if any increased redness or fevers.  Keep covered for next couple of days.  Best, Dr. Ola Spurr

## 2017-08-06 ENCOUNTER — Ambulatory Visit: Payer: Self-pay | Admitting: Internal Medicine

## 2017-09-05 ENCOUNTER — Ambulatory Visit (INDEPENDENT_AMBULATORY_CARE_PROVIDER_SITE_OTHER): Payer: Self-pay | Admitting: Internal Medicine

## 2017-09-05 ENCOUNTER — Encounter: Payer: Self-pay | Admitting: Internal Medicine

## 2017-09-05 VITALS — BP 138/88 | HR 76 | Temp 98.1°F | Ht 64.0 in | Wt 211.0 lb

## 2017-09-05 DIAGNOSIS — R197 Diarrhea, unspecified: Secondary | ICD-10-CM

## 2017-09-05 DIAGNOSIS — R1110 Vomiting, unspecified: Secondary | ICD-10-CM

## 2017-09-05 MED ORDER — ONDANSETRON 4 MG PO TBDP: 4.0000 mg | ORAL_TABLET | Freq: Three times a day (TID) | ORAL | 0 refills | Status: DC | PRN

## 2017-09-05 NOTE — Patient Instructions (Signed)
Please return if you are still having vomiting and diarrhea after 7 days. Please return if you stop peeing or can't keep any fluids down with the Zofran. Please return if you vomit or poop blood.   Viral Gastroenteritis, Adult Viral gastroenteritis is also known as the stomach flu. This condition is caused by certain germs (viruses). These germs can be passed from person to person very easily (are very contagious). This condition can cause sudden watery poop (diarrhea), fever, and throwing up (vomiting). Having watery poop and throwing up can make you feel weak and cause you to get dehydrated. Dehydration can make you tired and thirsty, make you have a dry mouth, and make it so you pee (urinate) less often. Older adults and people with other diseases or a weak defense system (immune system) are at higher risk for dehydration. It is important to replace the fluids that you lose from having watery poop and throwing up. Follow these instructions at home: Follow instructions from your doctor about how to care for yourself at home. Eating and drinking  Follow these instructions as told by your doctor:  Take an oral rehydration solution (ORS). This is a drink that is sold at pharmacies and stores.  Drink clear fluids in small amounts as you are able, such as: ? Water. ? Ice chips. ? Diluted fruit juice. ? Low-calorie sports drinks.  Eat bland, easy-to-digest foods in small amounts as you are able, such as: ? Bananas. ? Applesauce. ? Rice. ? Low-fat (lean) meats. ? Toast. ? Crackers.  Avoid fluids that have a lot of sugar or caffeine in them.  Avoid alcohol.  Avoid spicy or fatty foods.  General instructions  Drink enough fluid to keep your pee (urine) clear or pale yellow.  Wash your hands often. If you cannot use soap and water, use hand sanitizer.  Make sure that all people in your home wash their hands well and often.  Rest at home while you get better.  Take over-the-counter  and prescription medicines only as told by your doctor.  Watch your condition for any changes.  Take a warm bath to help with any burning or pain from having watery poop.  Keep all follow-up visits as told by your doctor. This is important. Contact a doctor if:  You cannot keep fluids down.  Your symptoms get worse.  You have new symptoms.  You feel light-headed or dizzy.  You have muscle cramps. Get help right away if:  You have chest pain.  You feel very weak or you pass out (faint).  You see blood in your throw-up.  Your throw-up looks like coffee grounds.  You have bloody or black poop (stools) or poop that look like tar.  You have a very bad headache, a stiff neck, or both.  You have a rash.  You have very bad pain, cramping, or bloating in your belly (abdomen).  You have trouble breathing.  You are breathing very quickly.  Your heart is beating very quickly.  Your skin feels cold and clammy.  You feel confused.  You have pain when you pee.  You have signs of dehydration, such as: ? Dark pee, hardly any pee, or no pee. ? Cracked lips. ? Dry mouth. ? Sunken eyes. ? Sleepiness. ? Weakness. This information is not intended to replace advice given to you by your health care provider. Make sure you discuss any questions you have with your health care provider. Document Released: 05/20/2008 Document Revised: 06/21/2016 Document Reviewed:  08/08/2015 Elsevier Interactive Patient Education  2017 [employer].

## 2017-09-05 NOTE — Progress Notes (Signed)
   Subjective:    Mariah Rice - 45 y.o. female MRN [id number]  Date of birth: [birth year]  HPI  Mariah Rice is here for vomiting and diarrhea. Has been occurring for three days. Vomiting with almost all attempts to eat solid food. Still drinking liquids although decreased intake from baseline. Having diarrhea 3-4 times per day. Has associated stomach cramping with bowel movements. Otherwise, no abdominal pain. No blood in vomit or stools. Urinating normally. Afebrile at home but reports chills. No sick contacts. No recent antibiotics. Drinking Gatorade. Has tried Imodium and Pepcid without significant relief.    -  reports that she has been smoking Cigarettes.  She has been smoking about 0.50 packs per day. She has never used smokeless tobacco. - Review of Systems: Per HPI. - Past Medical History: Patient Active Problem List   Diagnosis Date Noted  . Skin tag 08/05/2017  . Foot swelling 06/09/2017  . Tick bite 05/13/2017  . Neck pain 03/26/2017  . Numbness and tingling in both hands 01/29/2017  . Rosacea 10/14/2016  . Pain in joint, shoulder region 10/06/2016  . Mood disorder ([place]) 10/06/2016  . Tingling sensation 08/28/2016  . Rash and nonspecific skin eruption 12/27/2015  . Pain in finger of left hand 12/27/2015  . GERD (gastroesophageal reflux disease) 10/19/2015  . H/O bilateral oophorectomy 08/13/2015  . S/P laparoscopic assisted vaginal hysterectomy (LAVH) 07/11/2015  . FH: ovarian cancer in first degree relative 05/04/2015  . Back pain 03/15/2015  . Dermoid cyst of arm 12/19/2014  . Environmental allergies 09/30/2013  . Depression 01/06/2013  . GAD (generalized anxiety disorder) 01/06/2013  . Headache 11/24/2012  . Tobacco abuse 11/10/2012  . Overweight 11/10/2012  . Hypertension 11/10/2012   - Medications: reviewed and updated   Objective:   Physical Exam BP 138/88   Pulse 76   Temp 98.1 F (36.7 C) (Oral)   Ht 5\' 4"  (1.626 m)   Wt 211 lb (95.7 kg)   LMP   (LMP Unknown) Comment: on Megace  SpO2 98%   BMI 36.22 kg/m  Gen: NAD, alert, cooperative with exam, well-appearing HEENT: NCAT, PERRL, clear conjunctiva, oropharynx clear, supple neck, MMM  CV: RRR, good S1/S2, no murmur, no edema, capillary refill brisk  Resp: CTABL, no wheezes, non-labored Abd: SNTND, BS present, no guarding or organomegaly    Assessment & Plan:  1. Vomiting and diarrhea Suspect related to viral gastroenteritis. Abdominal exam is benign. Concern for dehydration given report of frequent vomiting and diarrhea. However cap refill appropriate, no tachycardia, blood pressure normal, and MMM. Will treat symptoms with Zofran. Discussed that is usually self limited and resolves. If symptoms persist for >7 days, return for further work up. At that time would consider blood work and stool cultures. Abdominal exam is benign at present so doubt something like appendicitis requiring urgent management. Return for significant abdominal pain not associated with vomiting or diarrhea, fevers, blood in stools/vomit.  - ondansetron (ZOFRAN ODT) 4 MG disintegrating tablet; Take 1 tablet (4 mg total) by mouth every 8 (eight) hours as needed for nausea or vomiting.  Dispense: 20 tablet; Refill: 0   Phill Myron, D.O. 09/05/2017, 2:21 PM PGY-3, [place]

## 2017-09-17 ENCOUNTER — Encounter: Payer: Self-pay | Admitting: Internal Medicine

## 2017-09-17 ENCOUNTER — Ambulatory Visit (INDEPENDENT_AMBULATORY_CARE_PROVIDER_SITE_OTHER): Payer: Self-pay | Admitting: Internal Medicine

## 2017-09-17 VITALS — BP 130/78 | HR 86 | Temp 98.1°F | Wt 208.0 lb

## 2017-09-17 DIAGNOSIS — E663 Overweight: Secondary | ICD-10-CM

## 2017-09-17 DIAGNOSIS — Z23 Encounter for immunization: Secondary | ICD-10-CM

## 2017-09-17 DIAGNOSIS — R631 Polydipsia: Secondary | ICD-10-CM

## 2017-09-17 DIAGNOSIS — R197 Diarrhea, unspecified: Secondary | ICD-10-CM

## 2017-09-17 LAB — POCT GLYCOSYLATED HEMOGLOBIN (HGB A1C): Hemoglobin A1C: 5.7

## 2017-09-17 NOTE — Progress Notes (Signed)
[name] Progress Note  Subjective:  Mariah Rice is a 46 y.o. female with history of HTN, anxiety and depression, hyperthyroidism, and tobacco abuse who presents for continued diarrhea. Patient was seen for vomiting and diarrhea 09/05/17. She believes she may have consumed contaminated water from [place] when staying in [place], [place]. Patient thinks she was screened for hepatitis 2 years ago and was negative. Believes she had pancreatitis when she was younger. Occasionally drinks; maybe 1 wine cooler a weekend. She is still taking zofran for nausea but with this has not been vomiting and able to eat regular meals. She is having loose bowel movements 6-7 times a day. She has tried imodium without relief. No recent antibiotic use. No sick contacts. ROS: No current fever (though says she was febrile with onset of symptoms), no blood in stool  In addition, patient report increased thirst and requests A1c due to family history of diabetes.   Social: Current smoker, not ready to quit  Allergies  Allergen Reactions  . Penicillins Nausea Only and Rash    Has patient had a PCN reaction causing immediate rash, facial/tongue/throat swelling, SOB or lightheadedness with hypotension: no Has patient had a PCN reaction causing severe rash involving mucus membranes or skin necrosis:unknown Has patient had a PCN reaction that required hospitalization : yes Has patient had a PCN reaction occurring within the last 10 years: no If all of the above answers are "NO", then may proceed with Cephalosporin use.   . Sulfa Antibiotics Nausea Only and Rash    Objective: Blood pressure 130/78, pulse 86, temperature 98.1 F (36.7 C), temperature source Oral, weight 208 lb (94.3 kg), SpO2 96 %. Body mass index is 35.7 kg/m. Constitutional: Well appearing female in NAD HENT: MMM  Cardiovascular: RRR, S1, S2, no m/r/g.  Pulmonary/Chest: Effort normal and breath sounds normal.   Abdominal: Soft. +BS, NT, ND Skin: Skin is warm and dry. No rash noted.  Psychiatric: Normal mood and affect.  Vitals reviewed  Assessment/Plan: Diarrhea of presumed infectious origin - Given persistence of symptoms for over 2 weeks, will order stool PCR (includes C. diff) and Ova and parasite testing. Low suspicion for pancreatitis given benign abdominal exam. - Will obtain lipase, CBC, and CMP - Encouraged patient to stay well hydrated - Suggested taking probiotics vs eating yogurt with lactobacillus - May need GI referral if all labs result as normal  Health maintenance: Will obtain lipid panel given history of HTN, smoking, and obesity.  Follow-up prn.  Olene Floss, MD [place], PGY-3

## 2017-09-17 NOTE — Patient Instructions (Signed)
Mariah Rice,  I will call you with lab results as they are available. I suspect your diarrhea at least started as an infection. Continue zofran as needed. If you need refills, call me. Yogurt with lactobacillus could help reset the balance of the good bacteria in your colon if you could tolerate it.  Please bring your stool samples to [place] at your earliest convenience.  Best, Dr. Ola Spurr

## 2017-09-18 LAB — COMPREHENSIVE METABOLIC PANEL
A/G RATIO: 1.6 (ref 1.2–2.2)
ALBUMIN: 4.1 g/dL (ref 3.5–5.5)
ALK PHOS: 97 IU/L (ref 39–117)
ALT: 26 IU/L (ref 0–32)
AST: 22 IU/L (ref 0–40)
BILIRUBIN TOTAL: 0.4 mg/dL (ref 0.0–1.2)
BUN / CREAT RATIO: 18 (ref 9–23)
BUN: 13 mg/dL (ref 6–24)
CO2: 23 mmol/L (ref 20–29)
Calcium: 9.4 mg/dL (ref 8.7–10.2)
Chloride: 100 mmol/L (ref 96–106)
Creatinine, Ser: 0.72 mg/dL (ref 0.57–1.00)
GFR calc Af Amer: 118 mL/min/{1.73_m2} (ref >59)
GFR calc non Af Amer: 102 mL/min/{1.73_m2} (ref >59)
GLOBULIN, TOTAL: 2.6 g/dL (ref 1.5–4.5)
Glucose: 93 mg/dL (ref 65–99)
POTASSIUM: 3.7 mmol/L (ref 3.5–5.2)
SODIUM: 140 mmol/L (ref 134–144)
Total Protein: 6.7 g/dL (ref 6.0–8.5)

## 2017-09-18 LAB — CBC
HEMATOCRIT: 42 % (ref 34.0–46.6)
HEMOGLOBIN: 14.2 g/dL (ref 11.1–15.9)
MCH: 30.5 pg (ref 26.6–33.0)
MCHC: 33.8 g/dL (ref 31.5–35.7)
MCV: 90 fL (ref 79–97)
Platelets: 325 10*3/uL (ref 150–379)
RBC: 4.66 x10E6/uL (ref 3.77–5.28)
RDW: 14.2 % (ref 12.3–15.4)
WBC: 6.2 10*3/uL (ref 3.4–10.8)

## 2017-09-18 LAB — LIPID PANEL
CHOL/HDL RATIO: 5.5 ratio — AB (ref 0.0–4.4)
CHOLESTEROL TOTAL: 230 mg/dL — AB (ref 100–199)
HDL: 42 mg/dL (ref >39)
LDL Calculated: 142 mg/dL — ABNORMAL HIGH (ref 0–99)
TRIGLYCERIDES: 230 mg/dL — AB (ref 0–149)
VLDL Cholesterol Cal: 46 mg/dL — ABNORMAL HIGH (ref 5–40)

## 2017-09-18 LAB — LIPASE: LIPASE: 47 U/L (ref 14–72)

## 2017-09-19 ENCOUNTER — Telehealth: Payer: Self-pay | Admitting: Internal Medicine

## 2017-09-19 DIAGNOSIS — R197 Diarrhea, unspecified: Secondary | ICD-10-CM | POA: Insufficient documentation

## 2017-09-19 DIAGNOSIS — R631 Polydipsia: Secondary | ICD-10-CM | POA: Insufficient documentation

## 2017-09-19 MED ORDER — ATORVASTATIN CALCIUM 10 MG PO TABS: 10.0000 mg | ORAL_TABLET | Freq: Every day | ORAL | 0 refills | Status: DC

## 2017-09-19 NOTE — Assessment & Plan Note (Addendum)
-   Given persistence of symptoms for over 2 weeks, will order stool PCR (includes C. diff) and Ova and parasite testing. Low suspicion for pancreatitis given benign abdominal exam. - Will obtain lipase, CBC, and CMP - Encouraged patient to stay well hydrated - Suggested taking probiotics vs eating yogurt with lactobacillus - May need GI referral if all labs result as normal

## 2017-09-19 NOTE — Telephone Encounter (Signed)
She would like cholesterol meds called in.  Cailie Bosshart, Salome Spotted, CMA

## 2017-09-19 NOTE — Telephone Encounter (Signed)
Pt returned drs call. She is at work and doesn't get off until 7.

## 2017-09-19 NOTE — Telephone Encounter (Signed)
Placed order for lipitor 10 mg daily.

## 2017-09-19 NOTE — Telephone Encounter (Signed)
Had left her a message with lab results. Gave her option of me sending in a cholesterol medication or discussing further in clinic. Will send letter with results.

## 2017-09-22 ENCOUNTER — Telehealth: Payer: Self-pay | Admitting: Internal Medicine

## 2017-09-22 NOTE — Telephone Encounter (Signed)
Pt informed. Deseree Blount, CMA  

## 2017-09-22 NOTE — Telephone Encounter (Signed)
Pt is returning dr fitzgeralds call.  Pt is off today and can be reached at any time

## 2017-09-22 NOTE — Telephone Encounter (Signed)
I called in lipitor for her to start to lower cholesterol. Please let her know. Thank you.

## 2017-10-15 ENCOUNTER — Telehealth: Payer: Self-pay

## 2017-10-15 NOTE — Telephone Encounter (Signed)
Thank you. She can stop the medication and an alternative statin (e.g., simvastatin, pravastatin) may be suggested at upcoming follow-up.

## 2017-10-15 NOTE — Telephone Encounter (Signed)
FYI Patient is having a reaction to Lipitor of painful bilateral legs. She wanted you to know. She says that has to be the issue because she has not done anything different. Has an appointment on 10/17/2017.Mariah Rice

## 2017-10-17 ENCOUNTER — Encounter: Payer: Self-pay | Admitting: Internal Medicine

## 2017-10-17 ENCOUNTER — Ambulatory Visit (INDEPENDENT_AMBULATORY_CARE_PROVIDER_SITE_OTHER): Payer: BLUE CROSS/BLUE SHIELD | Admitting: Internal Medicine

## 2017-10-17 VITALS — BP 154/92 | HR 75 | Temp 98.0°F | Ht 64.0 in | Wt 209.4 lb

## 2017-10-17 DIAGNOSIS — M791 Myalgia, unspecified site: Secondary | ICD-10-CM | POA: Diagnosis not present

## 2017-10-17 NOTE — Patient Instructions (Addendum)
We will check some labs today for your muscle aches. Do not re-start your statin medication. Follow up with Dr. Ola Spurr in the next couple of weeks to discuss further management. Make sure you drink plenty of fluids.

## 2017-10-17 NOTE — Progress Notes (Signed)
   Subjective:    [name] - 46 y.o. female MRN [id number]  Date of birth: 1971-11-30  HPI  Mariah Rice is here for bilateral leg cramping. This has been occurring for about the past 2-3 weeks. No lower extremity edema or erythema. Cramping present bilaterally. Patient attributes this to the initiation of Lipitor right before symptoms started. Afebrile. No chest pain or SOB. No cough or hemoptysis. She discontinued her Lipitor about 2 days ago but is still having the myalgias.   -  reports that she has been smoking Cigarettes.  She has been smoking about 0.50 packs per day. She has never used smokeless tobacco. - Review of Systems: Per HPI. - Past Medical History: Patient Active Problem List   Diagnosis Date Noted  . Diarrhea of presumed infectious origin 09/19/2017  . Increased thirst 09/19/2017  . Skin tag 08/05/2017  . Foot swelling 06/09/2017  . Tick bite 05/13/2017  . Neck pain 03/26/2017  . Numbness and tingling in both hands 01/29/2017  . Rosacea 10/14/2016  . Pain in joint, shoulder region 10/06/2016  . Mood disorder ([place]) 10/06/2016  . Tingling sensation 08/28/2016  . Rash and nonspecific skin eruption 12/27/2015  . Pain in finger of left hand 12/27/2015  . GERD (gastroesophageal reflux disease) 10/19/2015  . H/O bilateral oophorectomy 08/13/2015  . S/P laparoscopic assisted vaginal hysterectomy (LAVH) 07/11/2015  . FH: ovarian cancer in first degree relative 05/04/2015  . Back pain 03/15/2015  . Dermoid cyst of arm 12/19/2014  . Environmental allergies 09/30/2013  . Depression 01/06/2013  . GAD (generalized anxiety disorder) 01/06/2013  . Headache 11/24/2012  . Tobacco abuse 11/10/2012  . Overweight 11/10/2012  . Hypertension 11/10/2012   - Medications: reviewed and updated   Objective:   Physical Exam BP (!) 154/92   Pulse 75   Temp 98 F (36.7 C) (Oral)   Ht 5\' 4"  (1.626 m)   Wt 209 lb 6.4 oz (95 kg)   LMP  (LMP Unknown) Comment: on Megace  SpO2  98%   BMI 35.94 kg/m  Gen: NAD, alert, cooperative with exam, well-appearing CV: RRR, good S1/S2, no murmur, no edema, capillary refill brisk  Resp: CTABL, no wheezes, non-labored Ext: No edema of LE present. No TTP of calves. Negative Homan's sign bilaterally. 2+ pedal pulses.  Assessment & Plan:   1. Myalgia Suspect statin induced myalgia given time course. Since patient is still experiencing symptoms despite discontinuation of medication, elected not to start another statin therapy at present. Will check CK. Check CMET to ensure electrolyte derangements not contributing. Advised to continue holding Lipitor. Discussed adequate hydration. No signs or symptoms of DVT on exam. Return to see PCP to discuss further management. Could try alternate statin at follow up if symptoms resolved.  - CK - Comprehensive metabolic panel   Phill Myron, D.O. 10/18/2017, 8:36 PM PGY-3, [place]

## 2017-10-18 LAB — COMPREHENSIVE METABOLIC PANEL
A/G RATIO: 1.4 (ref 1.2–2.2)
ALBUMIN: 4.2 g/dL (ref 3.5–5.5)
ALT: 15 IU/L (ref 0–32)
AST: 15 IU/L (ref 0–40)
Alkaline Phosphatase: 85 IU/L (ref 39–117)
BUN / CREAT RATIO: 11 (ref 9–23)
BUN: 11 mg/dL (ref 6–24)
Bilirubin Total: <0.2 mg/dL (ref 0.0–1.2)
CALCIUM: 9.4 mg/dL (ref 8.7–10.2)
CO2: 24 mmol/L (ref 20–29)
CREATININE: 0.99 mg/dL (ref 0.57–1.00)
Chloride: 101 mmol/L (ref 96–106)
GFR calc Af Amer: 80 mL/min/{1.73_m2} (ref >59)
GFR, EST NON AFRICAN AMERICAN: 70 mL/min/{1.73_m2} (ref >59)
GLOBULIN, TOTAL: 2.9 g/dL (ref 1.5–4.5)
Glucose: 87 mg/dL (ref 65–99)
POTASSIUM: 3.7 mmol/L (ref 3.5–5.2)
SODIUM: 140 mmol/L (ref 134–144)
TOTAL PROTEIN: 7.1 g/dL (ref 6.0–8.5)

## 2017-10-18 LAB — CK: Total CK: 107 U/L (ref 24–173)

## 2017-10-20 ENCOUNTER — Telehealth: Payer: Self-pay | Admitting: *Deleted

## 2017-10-20 NOTE — Telephone Encounter (Signed)
-----   Message from Nicolette Bang, DO sent at 10/18/2017  8:59 PM EDT ----- Please call patient to let her know that labs were all normal. She should follow up with Dr. Ola Spurr.   Phill Myron, D.O. 10/18/2017, 8:59 PM PGY-3, [place]

## 2017-10-20 NOTE — Telephone Encounter (Signed)
Contacted pt and gave her the below information and she would like for her PCP to call back she has some questions about her medication. Routing to PCP. Katharina Caper, [place] D, [place]

## 2017-10-21 MED ORDER — PRAVASTATIN SODIUM 40 MG PO TABS: 40.0000 mg | ORAL_TABLET | Freq: Every day | ORAL | 1 refills | Status: DC

## 2017-10-21 NOTE — Telephone Encounter (Signed)
Patient says her leg pain has resolved, and she would like to try different statin. Prescribed pravastatin 40 mg.   Olene Floss, MD [place], PGY-3

## 2017-10-23 ENCOUNTER — Other Ambulatory Visit: Payer: Self-pay | Admitting: Internal Medicine

## 2017-10-23 DIAGNOSIS — M791 Myalgia, unspecified site: Secondary | ICD-10-CM

## 2017-10-23 MED ORDER — IBUPROFEN 800 MG PO TABS: 800.0000 mg | ORAL_TABLET | Freq: Two times a day (BID) | ORAL | 6 refills | Status: DC | PRN

## 2017-11-12 ENCOUNTER — Other Ambulatory Visit: Payer: Self-pay | Admitting: Internal Medicine

## 2017-11-12 DIAGNOSIS — Z90722 Acquired absence of ovaries, bilateral: Secondary | ICD-10-CM

## 2017-11-26 ENCOUNTER — Ambulatory Visit (INDEPENDENT_AMBULATORY_CARE_PROVIDER_SITE_OTHER): Payer: BLUE CROSS/BLUE SHIELD | Admitting: Internal Medicine

## 2017-11-26 ENCOUNTER — Encounter: Payer: Self-pay | Admitting: Internal Medicine

## 2017-11-26 VITALS — BP 138/80 | HR 83 | Temp 98.1°F | Ht 64.0 in | Wt 211.0 lb

## 2017-11-26 DIAGNOSIS — G43001 Migraine without aura, not intractable, with status migrainosus: Secondary | ICD-10-CM | POA: Diagnosis not present

## 2017-11-26 MED ORDER — KETOROLAC TROMETHAMINE 30 MG/ML IJ SOLN
30.0000 mg | Freq: Once | INTRAMUSCULAR | Status: AC
  Administered 2017-11-26: 30 mg via INTRAMUSCULAR

## 2017-11-26 MED ORDER — SUMATRIPTAN SUCCINATE 100 MG PO TABS: 100.0000 mg | ORAL_TABLET | ORAL | 0 refills | Status: DC | PRN

## 2017-11-26 NOTE — Patient Instructions (Signed)
Ms. Pennock,  Thank you for coming in.  Please try imitrex 100 mg when you are able to fill the prescription. Do not take more than 2 tablets in 24 hours.  In addition, avoid taking ibuprofen more than half the days of the month, as this can cause rebound headaches.  If you have another episode, try to keep a log of what you have eaten or what was happening when symptoms started.  Best, Dr. Ola Spurr

## 2017-11-30 ENCOUNTER — Encounter: Payer: Self-pay | Admitting: Internal Medicine

## 2017-11-30 DIAGNOSIS — G43001 Migraine without aura, not intractable, with status migrainosus: Secondary | ICD-10-CM | POA: Insufficient documentation

## 2017-11-30 NOTE — Progress Notes (Signed)
[name] Progress Note  Subjective:  Mariah Rice is a 46 y.o. female with history of HTN, tobacco abuse, and GERD who presents for migraines. Patient reports on and off headaches since Saturday that feel like a "beating in her head." She has tried over the counter medications, which have not helped. Headaches can occur at any time of the day but have been severe enough she could not go to work Monday or Tuesday because of loud work environment. Light and sound make symptoms worse and has had some nausea, as well. She used to drink a lot of soda but now has only 1 or 2 a day, usually decaffeinated, and reports drinking a lot of water. She denies taking NSAIDs more than half the days out of a month. No change in vision. No trouble with gait. She denies missing doses of lamictal, abilify, or cymbalta. She is on estradiol for hormone replacement after hysterectomy with oophorectomy.   Allergies  Allergen Reactions  . Penicillins Nausea Only and Rash    Has patient had a PCN reaction causing immediate rash, facial/tongue/throat swelling, SOB or lightheadedness with hypotension: no Has patient had a PCN reaction causing severe rash involving mucus membranes or skin necrosis:unknown Has patient had a PCN reaction that required hospitalization : yes Has patient had a PCN reaction occurring within the last 10 years: no If all of the above answers are "NO", then may proceed with Cephalosporin use.   . Sulfa Antibiotics Nausea Only and Rash    Social History   Tobacco Use  . Smoking status: Current Every Day Smoker    Packs/day: 0.50    Types: Cigarettes  . Smokeless tobacco: Never Used  Substance Use Topics  . Alcohol use: Yes    Alcohol/week: 0.0 oz    Comment: occasional    Objective: Blood pressure 138/80, pulse 83, temperature 98.1 F (36.7 C), temperature source Oral, height 5\' 4"  (1.626 m), weight 211 lb (95.7 kg), SpO2 97 %. Body mass index is 36.22  kg/m. Constitutional: Obese female in mild distress HENT: NCAT, MMM. Cardiovascular: RRR, S1, S2, no m/r/g.  Pulmonary/Chest: Effort normal and breath sounds normal.  Neurological: AOx3, no focal deficits. CNII-XII intact.  MSK: Neck supple.  Skin: Skin is warm and dry. No rash noted.  Psychiatric: Normal mood and affect.  Vitals reviewed  Assessment/Plan: Migraine without aura and with status migrainosus, not intractable - Patient having migraine symptoms with aura. Symptoms are at times severe but not intractable and improve with rest. No meningeal signs and afebrile. BP controlled.  - Will give toradol shot in office. - Prescribed imitrex to break current episode (discussed possibility of giving shot with Dr. Valentina Lucks but given patient's history of anxiety and possible side effect of chest tightness felt oral was the safer option. Also discussed safety of occasional use with SSRI).  - Consider checking estradiol level in future (would plan to continue hormone therapy until age of typical menopause, around 25).  - Asked patient to keep headache journal if has future episodes.   Follow-up if symptoms not relieved or are becoming more frequent.  Olene Floss, MD [place], PGY-3

## 2017-11-30 NOTE — Assessment & Plan Note (Addendum)
-   Patient having migraine symptoms with aura. Symptoms are at times severe but not intractable and improve with rest. No meningeal signs and afebrile. BP controlled.  - Will give toradol shot in office. - Prescribed imitrex to break current episode (discussed possibility of giving shot with Dr. Valentina Lucks but given patient's history of anxiety and possible side effect of chest tightness felt oral was the safer option. Also discussed safety of occasional use with SSRI).  - Consider checking estradiol level in future (would plan to continue hormone therapy until age of typical menopause, around 33).  - Asked patient to keep headache journal if has future episodes.

## 2017-12-03 ENCOUNTER — Other Ambulatory Visit: Payer: Self-pay | Admitting: Internal Medicine

## 2017-12-11 ENCOUNTER — Telehealth: Payer: Self-pay | Admitting: Internal Medicine

## 2017-12-11 NOTE — Telephone Encounter (Signed)
Pt said the Rx she was given for her migraines a couple of weeks ago made her have an allergic reaction. She said she took one pill and immediately started throwing up and it felt like her throat was swelling up. She said she also started getting a little bit of a rash. Pt did not take anymore than that one pill. Pt said she is allergic to penicillins and sulfa. She would like a different Rx for her migraines. Please advise

## 2017-12-12 ENCOUNTER — Other Ambulatory Visit: Payer: Self-pay | Admitting: Internal Medicine

## 2017-12-12 MED ORDER — PROPRANOLOL HCL 20 MG PO TABS: 20.0000 mg | ORAL_TABLET | Freq: Three times a day (TID) | ORAL | 0 refills | Status: DC

## 2017-12-12 NOTE — Telephone Encounter (Signed)
Called patient to see how often she is having symptoms. Reached voicemail. Recommended trying propranolol 20 mg TID. If this helps, would switch to long-acting (inderal). Asked her to make an appointment to follow-up in the next few weeks.

## 2018-01-02 ENCOUNTER — Other Ambulatory Visit: Payer: Self-pay | Admitting: Internal Medicine

## 2018-01-02 DIAGNOSIS — I10 Essential (primary) hypertension: Secondary | ICD-10-CM

## 2018-01-08 ENCOUNTER — Other Ambulatory Visit: Payer: Self-pay | Admitting: Internal Medicine

## 2018-01-08 DIAGNOSIS — H524 Presbyopia: Secondary | ICD-10-CM | POA: Diagnosis not present

## 2018-01-15 DIAGNOSIS — F17210 Nicotine dependence, cigarettes, uncomplicated: Secondary | ICD-10-CM | POA: Diagnosis not present

## 2018-01-15 DIAGNOSIS — Z716 Tobacco abuse counseling: Secondary | ICD-10-CM | POA: Diagnosis not present

## 2018-02-08 ENCOUNTER — Other Ambulatory Visit: Payer: Self-pay | Admitting: Family Medicine

## 2018-02-10 ENCOUNTER — Ambulatory Visit (INDEPENDENT_AMBULATORY_CARE_PROVIDER_SITE_OTHER): Payer: BLUE CROSS/BLUE SHIELD | Admitting: Internal Medicine

## 2018-02-10 ENCOUNTER — Other Ambulatory Visit: Payer: Self-pay

## 2018-02-10 ENCOUNTER — Encounter: Payer: Self-pay | Admitting: Internal Medicine

## 2018-02-10 VITALS — BP 146/80 | HR 73 | Temp 98.2°F | Wt 215.0 lb

## 2018-02-10 DIAGNOSIS — M25512 Pain in left shoulder: Secondary | ICD-10-CM | POA: Diagnosis not present

## 2018-02-10 DIAGNOSIS — M25552 Pain in left hip: Secondary | ICD-10-CM | POA: Diagnosis not present

## 2018-02-10 MED ORDER — METHOCARBAMOL 500 MG PO TABS: 500.0000 mg | ORAL_TABLET | Freq: Two times a day (BID) | ORAL | 0 refills | Status: DC | PRN

## 2018-02-10 MED ORDER — PREDNISONE 20 MG PO TABS: ORAL_TABLET | ORAL | 0 refills | Status: DC

## 2018-02-10 NOTE — Patient Instructions (Signed)
Ms. Budney,  I think you have muscle strain and possibly inflammation in the hip.  Take prednisone for the next 5 days. Take robaxin as needed for muscle spasm. Take ibuprofen twice daily for the next week.  Please see me back if no improvement.  Best, Dr. Ola Spurr

## 2018-02-11 ENCOUNTER — Encounter: Payer: Self-pay | Admitting: Internal Medicine

## 2018-02-11 DIAGNOSIS — M25552 Pain in left hip: Secondary | ICD-10-CM | POA: Insufficient documentation

## 2018-02-11 NOTE — Assessment & Plan Note (Addendum)
-   Suspect rotator cuff tendonitis with contribution of muscle spasm of upper back. - Recommended continuing ibuprofen BID for the next week. Could consider shoulder steroid injection in a few weeks if no improvement.  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  - Can try robaxin prn for sensation of muscle tightness.
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
## 2018-02-11 NOTE — Assessment & Plan Note (Signed)
-   May be increased inflammation in setting of arthritis vs mild trochanteric bursitis given increased discomfort with internal rotation at hip. Will try short course of PO prednisone (5-6 days) to help with pain.

## 2018-02-25 ENCOUNTER — Other Ambulatory Visit: Payer: Self-pay | Admitting: Internal Medicine

## 2018-02-26 ENCOUNTER — Ambulatory Visit: Payer: BLUE CROSS/BLUE SHIELD | Admitting: Internal Medicine

## 2018-02-26 DIAGNOSIS — Z716 Tobacco abuse counseling: Secondary | ICD-10-CM | POA: Diagnosis not present

## 2018-02-26 DIAGNOSIS — F17210 Nicotine dependence, cigarettes, uncomplicated: Secondary | ICD-10-CM | POA: Diagnosis not present

## 2018-03-12 DIAGNOSIS — Z719 Counseling, unspecified: Secondary | ICD-10-CM | POA: Diagnosis not present

## 2018-03-12 DIAGNOSIS — Z008 Encounter for other general examination: Secondary | ICD-10-CM | POA: Diagnosis not present

## 2018-03-12 DIAGNOSIS — Z716 Tobacco abuse counseling: Secondary | ICD-10-CM | POA: Diagnosis not present

## 2018-03-12 DIAGNOSIS — Z1331 Encounter for screening for depression: Secondary | ICD-10-CM | POA: Diagnosis not present

## 2018-03-12 DIAGNOSIS — F17210 Nicotine dependence, cigarettes, uncomplicated: Secondary | ICD-10-CM | POA: Diagnosis not present

## 2018-03-19 ENCOUNTER — Ambulatory Visit (INDEPENDENT_AMBULATORY_CARE_PROVIDER_SITE_OTHER): Payer: BLUE CROSS/BLUE SHIELD | Admitting: Internal Medicine

## 2018-03-19 ENCOUNTER — Encounter: Payer: Self-pay | Admitting: Internal Medicine

## 2018-03-19 VITALS — BP 122/60 | HR 66 | Temp 98.3°F | Ht 64.0 in | Wt 221.2 lb

## 2018-03-19 DIAGNOSIS — G43801 Other migraine, not intractable, with status migrainosus: Secondary | ICD-10-CM | POA: Diagnosis not present

## 2018-03-19 DIAGNOSIS — S76012A Strain of muscle, fascia and tendon of left hip, initial encounter: Secondary | ICD-10-CM | POA: Diagnosis not present

## 2018-03-19 DIAGNOSIS — G43001 Migraine without aura, not intractable, with status migrainosus: Secondary | ICD-10-CM | POA: Diagnosis not present

## 2018-03-19 MED ORDER — PROPRANOLOL HCL ER 80 MG PO CP24: 80.0000 mg | ORAL_CAPSULE | Freq: Every day | ORAL | 2 refills | Status: DC

## 2018-03-19 NOTE — Progress Notes (Signed)
[name] Progress Note  Subjective:  Mariah Rice is a 47 y.o. female with history of migraines, HTN, bipolar disorder and tobacco abuse who presents for back pain. She was last seen 2/26 for left hip and shoulder pain not brought on by any known injury and was thought to have rotator cuff tendinopathy and possible left greater trochanteric bursitis. Shoulder pain improved with course of PO prednisone, but hip pain continues. She says now her left lower back is bothering her. She points to her buttocks. She describes pain as an ache. Standing makes pain worse. Sometimes pain wakes her up from sleep. Ibuprofen doesn't help much. Says pain does not radiate down her legs. ROS: No falls, no rashes   Allergies  Allergen Reactions  . Sumatriptan     Reported nausea, rash, throat felt like it was closing up  . Penicillins Nausea Only and Rash    Has patient had a PCN reaction causing immediate rash, facial/tongue/throat swelling, SOB or lightheadedness with hypotension: no Has patient had a PCN reaction causing severe rash involving mucus membranes or skin necrosis:unknown Has patient had a PCN reaction that required hospitalization : yes Has patient had a PCN reaction occurring within the last 10 years: no If all of the above answers are "NO", then may proceed with Cephalosporin use.   . Sulfa Antibiotics Nausea Only and Rash    Social History   Tobacco Use  . Smoking status: Current Every Day Smoker    Packs/day: 0.50    Types: Cigarettes  . Smokeless tobacco: Never Used  Substance Use Topics  . Alcohol use: Yes    Alcohol/week: 0.0 oz    Comment: occasional    Objective: Blood pressure 122/60, pulse 66, temperature 98.3 F (36.8 C), temperature source Oral, height 5\' 4"  (1.626 m), weight 221 lb 3.2 oz (100.3 kg), SpO2 96 %. Body mass index is 37.97 kg/m. Constitutional: Well-appearing female in NAD Musculoskeletal: TTP over left buttocks. No midline spinal TTP.  Reports mild TTP over left greater trochanter. Hip abduction 4+/5 on left compared to 5/5 on right.  Neurological: AOx3, no focal deficits. Negative straight leg raise bilaterally.  Skin: Skin is warm and dry. No rash noted.  Psychiatric: Normal mood and affect.  Vitals reviewed  Assessment/Plan: Muscle strain of left gluteal region - Provided options of referral to sports medicine, as may have greater trochanteric bursitis that could benefit from hip injection, versus conservative measures including PT. Patient prefers to do home exercises. - Provided handout on stretches. Recommended icing.  - Asked patient to call if would like Sports Med referral.   Migraine without aura and with status migrainosus, not intractable - Switched propranolol to long-acting 80 mg daily for ease of regimen. Asked patient to call if having any dizziness/lightheadedness.   Follow-up if no improvement.  Olene Floss, MD [place], PGY-3

## 2018-03-19 NOTE — Patient Instructions (Signed)
Ms. Zellars,  You have a strain injury of your gluteal muscles. Stretching rehab can help this. This injury is often associated with inflammation of the hip bursa, as well.   I would recommend strengthening exercises and icing. Continuing anti-inflammatories as needed.  Please let me know if you would like a sports medicine referral for possible injection of hip bursa.  I have switched your short-acting propranolol to long-acting. The dose is 80 mg. If you feel weak or dizzy, please let me know and I will adjust the dose.  Best, Dr. Ola Spurr

## 2018-03-20 DIAGNOSIS — G43909 Migraine, unspecified, not intractable, without status migrainosus: Secondary | ICD-10-CM | POA: Insufficient documentation

## 2018-03-20 DIAGNOSIS — S76012A Strain of muscle, fascia and tendon of left hip, initial encounter: Secondary | ICD-10-CM | POA: Insufficient documentation

## 2018-03-22 ENCOUNTER — Encounter: Payer: Self-pay | Admitting: Internal Medicine

## 2018-03-22 NOTE — Assessment & Plan Note (Signed)
-   Switched propranolol to long-acting 80 mg daily for ease of regimen. Asked patient to call if having any dizziness/lightheadedness.

## 2018-03-22 NOTE — Assessment & Plan Note (Signed)
-   Provided options of referral to sports medicine, as may have greater trochanteric bursitis that could benefit from hip injection, versus conservative measures including PT. Patient prefers to do home exercises. - Provided handout on stretches. Recommended icing.  - Asked patient to call if would like Sports Med referral.

## 2018-04-05 ENCOUNTER — Other Ambulatory Visit: Payer: Self-pay | Admitting: Internal Medicine

## 2018-04-05 DIAGNOSIS — I10 Essential (primary) hypertension: Secondary | ICD-10-CM

## 2018-04-20 ENCOUNTER — Other Ambulatory Visit: Payer: Self-pay | Admitting: Internal Medicine

## 2018-04-30 DIAGNOSIS — Z1231 Encounter for screening mammogram for malignant neoplasm of breast: Secondary | ICD-10-CM | POA: Diagnosis not present

## 2018-05-05 DIAGNOSIS — R599 Enlarged lymph nodes, unspecified: Secondary | ICD-10-CM | POA: Diagnosis not present

## 2018-05-05 DIAGNOSIS — R922 Inconclusive mammogram: Secondary | ICD-10-CM | POA: Diagnosis not present

## 2018-05-07 ENCOUNTER — Encounter: Payer: Self-pay | Admitting: Internal Medicine

## 2018-05-28 ENCOUNTER — Encounter: Payer: Self-pay | Admitting: Internal Medicine

## 2018-05-28 ENCOUNTER — Ambulatory Visit (INDEPENDENT_AMBULATORY_CARE_PROVIDER_SITE_OTHER): Payer: BLUE CROSS/BLUE SHIELD | Admitting: Internal Medicine

## 2018-05-28 VITALS — BP 124/60 | HR 76 | Temp 98.5°F | Ht 64.0 in | Wt 215.8 lb

## 2018-05-28 DIAGNOSIS — B353 Tinea pedis: Secondary | ICD-10-CM | POA: Diagnosis not present

## 2018-05-28 DIAGNOSIS — R7303 Prediabetes: Secondary | ICD-10-CM

## 2018-05-28 DIAGNOSIS — M791 Myalgia, unspecified site: Secondary | ICD-10-CM

## 2018-05-28 DIAGNOSIS — L84 Corns and callosities: Secondary | ICD-10-CM

## 2018-05-28 DIAGNOSIS — G43801 Other migraine, not intractable, with status migrainosus: Secondary | ICD-10-CM

## 2018-05-28 DIAGNOSIS — M25552 Pain in left hip: Secondary | ICD-10-CM

## 2018-05-28 LAB — POCT GLYCOSYLATED HEMOGLOBIN (HGB A1C): HbA1c, POC (controlled diabetic range): 5.6 % (ref 0.0–7.0)

## 2018-05-28 MED ORDER — PROPRANOLOL HCL ER 80 MG PO CP24: 80.0000 mg | ORAL_CAPSULE | Freq: Every day | ORAL | 2 refills | Status: DC

## 2018-05-28 MED ORDER — TERBINAFINE HCL 1 % EX CREA: 1.0000 "application " | TOPICAL_CREAM | Freq: Two times a day (BID) | CUTANEOUS | 0 refills | Status: DC

## 2018-05-28 MED ORDER — PRAZOSIN HCL 1 MG PO CAPS: 2.0000 mg | ORAL_CAPSULE | Freq: Every day | ORAL | Status: DC

## 2018-05-28 MED ORDER — METHYLPREDNISOLONE ACETATE 40 MG/ML IJ SUSP
40.0000 mg | Freq: Once | INTRAMUSCULAR | Status: AC
  Administered 2018-05-28: 40 mg via INTRAMUSCULAR

## 2018-05-28 MED ORDER — IBUPROFEN 800 MG PO TABS: 800.0000 mg | ORAL_TABLET | Freq: Two times a day (BID) | ORAL | 6 refills | Status: DC | PRN

## 2018-05-28 NOTE — Progress Notes (Signed)
[name] Progress Note  Subjective:  Mariah Rice is a 47 y.o. female with history of migraines, HTN, bipolar disorder and tobacco abuse who presents for ongoing left hip pain. She first began to have pain over her left hip in February. She tried a PO steroid taper with just a little relief at that point. The pain is now more constant and affects her with both sitting or standing. She is interested in a bursa injection at this point. She takes ibuprofen without much relief. Pain is limited to hip area without radiating symptoms down her leg. She stands and walks a lot for her job. ROS: No fever, no rash, no bowel or bladder incontinence  She also has had a lot of peeling of the soles of her feet and painful calluses of her heels. She has tried clotrimazole cream with result of more peeling. She requested checking for diabetes because she was worried this could be equivalent to a foot wound and her father has diabetes. She has been cutting back on soda significantly.   She says she is taking buspar and latuda now and has had increase in her minipress to 2 mg.   Allergies  Allergen Reactions  . Sumatriptan     Reported nausea, rash, throat felt like it was closing up  . Penicillins Nausea Only and Rash    Has patient had a PCN reaction causing immediate rash, facial/tongue/throat swelling, SOB or lightheadedness with hypotension: no Has patient had a PCN reaction causing severe rash involving mucus membranes or skin necrosis:unknown Has patient had a PCN reaction that required hospitalization : yes Has patient had a PCN reaction occurring within the last 10 years: no If all of the above answers are "NO", then may proceed with Cephalosporin use.   . Sulfa Antibiotics Nausea Only and Rash    Social History   Tobacco Use  . Smoking status: Current Every Day Smoker    Packs/day: 0.50    Types: Cigarettes  . Smokeless tobacco: Never Used  Substance Use Topics  . Alcohol  use: Yes    Alcohol/week: 0.0 oz    Comment: occasional    Objective: Blood pressure 124/60, pulse 76, temperature 98.5 F (36.9 C), temperature source Oral, height 5\' 4"  (1.626 m), weight 215 lb 12.8 oz (97.9 kg), SpO2 98 %. Body mass index is 37.04 kg/m. Constitutional: Well-appearing female in NAD Abdominal: Soft. +BS, NT Musculoskeletal: Point tenderness over left greater trochanter.  Neurological: No weakness with hip abduction and adduction bilaterally.  Skin: Skin is warm and dry. Peeling skin of dorsal surfaces of feet with hard, painful calluses of heels.  Psychiatric: Normal mood and affect.  Vitals reviewed  Assessment/Plan: INJECTION: Left Hip Greater Trochanteric Bursa Patient was given informed consent, signed copy in the chart. Appropriate time out was taken. Area prepped and draped in usual sterile fashion. 1 cc of methylprednisolone 40 mg/ml plus  3 cc of 1% lidocaine without epinephrine was injected into the left greater trochanteric bursa with patient laying on her side. The patient tolerated the procedure well. There were no complications. Relief felt with numbing medicine. Post procedure instructions were given.  Left hip pain - Exam consistent with greater tronchanteric bursitis. No weakness at hips to suggest piriformis syndrome. Performed steroid injection as above.   Prediabetes - A1c 5.6, compared to 5.7 last fall. Will continue to monitor and recommend weight loss.   Tinea pedis of both feet - Prescribed terbinafine and referred to Podiatry given  painful calluses.   Follow-up prn.  Olene Floss, MD [place], PGY-3

## 2018-05-31 ENCOUNTER — Encounter: Payer: Self-pay | Admitting: Internal Medicine

## 2018-05-31 DIAGNOSIS — B353 Tinea pedis: Secondary | ICD-10-CM | POA: Insufficient documentation

## 2018-05-31 DIAGNOSIS — R7303 Prediabetes: Secondary | ICD-10-CM | POA: Insufficient documentation

## 2018-05-31 NOTE — Assessment & Plan Note (Signed)
-   Prescribed terbinafine and referred to Podiatry given painful calluses.

## 2018-05-31 NOTE — Assessment & Plan Note (Signed)
-   A1c 5.6, compared to 5.7 last fall. Will continue to monitor and recommend weight loss.

## 2018-05-31 NOTE — Assessment & Plan Note (Signed)
-   Exam consistent with greater tronchanteric bursitis. No weakness at hips to suggest piriformis syndrome. Performed steroid injection as above.

## 2018-06-02 ENCOUNTER — Encounter: Payer: Self-pay | Admitting: Internal Medicine

## 2018-06-02 ENCOUNTER — Ambulatory Visit (INDEPENDENT_AMBULATORY_CARE_PROVIDER_SITE_OTHER): Payer: BLUE CROSS/BLUE SHIELD | Admitting: Internal Medicine

## 2018-06-02 ENCOUNTER — Other Ambulatory Visit: Payer: Self-pay

## 2018-06-02 VITALS — BP 120/70 | HR 71 | Temp 98.4°F | Wt 211.0 lb

## 2018-06-02 DIAGNOSIS — M545 Low back pain, unspecified: Secondary | ICD-10-CM

## 2018-06-02 DIAGNOSIS — G8929 Other chronic pain: Secondary | ICD-10-CM | POA: Diagnosis not present

## 2018-06-02 DIAGNOSIS — W57XXXA Bitten or stung by nonvenomous insect and other nonvenomous arthropods, initial encounter: Secondary | ICD-10-CM | POA: Diagnosis not present

## 2018-06-02 MED ORDER — CYCLOBENZAPRINE HCL 10 MG PO TABS: 10.0000 mg | ORAL_TABLET | Freq: Every day | ORAL | 0 refills | Status: DC

## 2018-06-02 MED ORDER — DOXYCYCLINE HYCLATE 100 MG PO TABS: 200.0000 mg | ORAL_TABLET | Freq: Once | ORAL | Status: DC

## 2018-06-02 MED ORDER — CYCLOBENZAPRINE HCL 10 MG PO TABS: 10.0000 mg | ORAL_TABLET | Freq: Every day | ORAL | 1 refills | Status: DC

## 2018-06-02 MED ORDER — DOXYCYCLINE HYCLATE 100 MG PO TABS: ORAL_TABLET | ORAL | 0 refills | Status: DC

## 2018-06-02 MED ORDER — PRAZOSIN HCL 2 MG PO CAPS: 2.0000 mg | ORAL_CAPSULE | Freq: Every day | ORAL | Status: DC

## 2018-06-02 NOTE — Patient Instructions (Signed)
Mariah Rice,  Doxycycline will cover for tick-borne illness.  Continue ibuprofen as needed for back pain. Sleeping on a regular bed and wearing supportive shoes may help. Try flexeril 10 mg before bed. You can take this up to three times a day. Please call us if no improvement in about 1 month, as physical therapy may be appropriate at that time. Continued weight loss will also help back pain.  Best, Dr. Ola Spurr

## 2018-06-04 DIAGNOSIS — W57XXXA Bitten or stung by nonvenomous insect and other nonvenomous arthropods, initial encounter: Secondary | ICD-10-CM | POA: Diagnosis not present

## 2018-06-04 DIAGNOSIS — F17210 Nicotine dependence, cigarettes, uncomplicated: Secondary | ICD-10-CM | POA: Diagnosis not present

## 2018-06-04 DIAGNOSIS — Z719 Counseling, unspecified: Secondary | ICD-10-CM | POA: Diagnosis not present

## 2018-06-04 DIAGNOSIS — Z008 Encounter for other general examination: Secondary | ICD-10-CM | POA: Diagnosis not present

## 2018-06-04 DIAGNOSIS — R609 Edema, unspecified: Secondary | ICD-10-CM | POA: Diagnosis not present

## 2018-06-04 DIAGNOSIS — Z716 Tobacco abuse counseling: Secondary | ICD-10-CM | POA: Diagnosis not present

## 2018-06-05 ENCOUNTER — Telehealth: Payer: Self-pay | Admitting: Internal Medicine

## 2018-06-05 NOTE — Telephone Encounter (Signed)
Pt called and said that she is still having issues with the spot where she found a tick. She said Dr Ola Spurr gave her an antibiotic for one day. Pt said she saw a nurse at her work her wrote her a 21 day antibiotic. The spot still has a knot and she would like to have a blood test for lyme disease and [facility] fever. I made her an appointment for 06/08/18. I didn't know if orders needed to be placed or not for the blood work.

## 2018-06-05 NOTE — Telephone Encounter (Signed)
Discussed with patient that she is at incredibly low risk for blood borne tick infection given short duration of tick being on her body, but she does not swelling into her neck so agree with follow-up next week to make sure that has improved.

## 2018-06-06 ENCOUNTER — Encounter: Payer: Self-pay | Admitting: Internal Medicine

## 2018-06-06 DIAGNOSIS — M545 Low back pain, unspecified: Secondary | ICD-10-CM | POA: Insufficient documentation

## 2018-06-06 DIAGNOSIS — G8929 Other chronic pain: Secondary | ICD-10-CM | POA: Insufficient documentation

## 2018-06-06 NOTE — Assessment & Plan Note (Signed)
-   Low risk for tick blood-borne diseases as likely attached < 24 hours. Given patient's concern, prescribed 200 mg doxycycline as prophylaxis.

## 2018-06-06 NOTE — Progress Notes (Signed)
[name] Progress Note  Subjective:  Mariah Rice is a 47 y.o. female with history of migraines, HTN, bipolar disorder and tobacco abuse who presents for lower back pain and tick concern.  #Back pain: - Ongoing but worse last week or so; no known injury - Has been sleeping on floor because boyfriend only has a twin bed - Worse when standing for long periods of time - No radiation of pain down legs; left hip pain resolved after greater trochanter injection last week - Has tried biofreeze, icy hot and takes regular ibuprofen without improvement ROS: No bladder/bowel incontinence, no falls  #Tick bite: - Noticed Sunday around 5 pm. It was small and attached but easily removed with fingers and not engorged. Thinks it could not have been present long since she had looked in the mirror prior to going to work that day.  - She is concerned she could have an infection because area where tick was attached is firm ROS: No fever  Allergies  Allergen Reactions  . Sumatriptan     Reported nausea, rash, throat felt like it was closing up  . Penicillins Nausea Only and Rash    Has patient had a PCN reaction causing immediate rash, facial/tongue/throat swelling, SOB or lightheadedness with hypotension: no Has patient had a PCN reaction causing severe rash involving mucus membranes or skin necrosis:unknown Has patient had a PCN reaction that required hospitalization : yes Has patient had a PCN reaction occurring within the last 10 years: no If all of the above answers are "NO", then may proceed with Cephalosporin use.   . Sulfa Antibiotics Nausea Only and Rash    Social History   Tobacco Use  . Smoking status: Current Every Day Smoker    Packs/day: 0.50    Types: Cigarettes  . Smokeless tobacco: Never Used  Substance Use Topics  . Alcohol use: Yes    Alcohol/week: 0.0 oz    Comment: occasional    Objective: Blood pressure 120/70, pulse 71, temperature 98.4 F (36.9  C), temperature source Oral, weight 211 lb (95.7 kg), SpO2 93 %. Body mass index is 36.22 kg/m. Constitutional: Well-appearing, pleasant Musculoskeletal: No midline spinal TTP. Moderate TTP over lumbar paraspinal muscles.  Neurological: Negative straight leg raise bilaterally.  Skin: Skin is warm and dry. Tanned skin. Small, flesh-colored papule anterior to right ear.  Psychiatric: Normal mood and affect.  Vitals reviewed  Assessment/Plan: Chronic bilateral low back pain without sciatica - Musculoskeletal in nature. May have been worsened by compensation for left hip pain. Recommended sleeping in a bed. To try muscle relaxant before going to sleep. Also counseled that weight loss can help. Could consider PT if no improvement in about a month.   Tick bite - Low risk for tick blood-borne diseases as likely attached < 24 hours. Given patient's concern, prescribed 200 mg doxycycline as prophylaxis.  Follow-up prn.  Olene Floss, MD [place], PGY-3

## 2018-06-06 NOTE — Assessment & Plan Note (Signed)
-   Musculoskeletal in nature. May have been worsened by compensation for left hip pain. Recommended sleeping in a bed. To try muscle relaxant before going to sleep. Also counseled that weight loss can help. Could consider PT if no improvement in about a month.

## 2018-06-08 ENCOUNTER — Encounter: Payer: Self-pay | Admitting: Internal Medicine

## 2018-06-08 ENCOUNTER — Ambulatory Visit (INDEPENDENT_AMBULATORY_CARE_PROVIDER_SITE_OTHER): Payer: BLUE CROSS/BLUE SHIELD | Admitting: Internal Medicine

## 2018-06-08 ENCOUNTER — Other Ambulatory Visit: Payer: Self-pay

## 2018-06-08 VITALS — BP 138/88 | HR 60 | Temp 98.6°F | Ht 64.0 in | Wt 213.0 lb

## 2018-06-08 DIAGNOSIS — W57XXXD Bitten or stung by nonvenomous insect and other nonvenomous arthropods, subsequent encounter: Secondary | ICD-10-CM

## 2018-06-08 DIAGNOSIS — I888 Other nonspecific lymphadenitis: Secondary | ICD-10-CM

## 2018-06-08 MED ORDER — LURASIDONE HCL 40 MG PO TABS: 40.0000 mg | ORAL_TABLET | Freq: Every day | ORAL | Status: DC

## 2018-06-08 MED ORDER — BUSPIRONE HCL 7.5 MG PO TABS: 7.5000 mg | ORAL_TABLET | Freq: Three times a day (TID) | ORAL | Status: DC

## 2018-06-08 MED ORDER — PRAVASTATIN SODIUM 40 MG PO TABS: 40.0000 mg | ORAL_TABLET | Freq: Every day | ORAL | 3 refills | Status: DC

## 2018-06-08 NOTE — Patient Instructions (Signed)
Mariah Rice,  Please continue doxycycline twice daily for 10 days. If you have worsening swelling, please return, as we would need to change your antibiotic.  Best, Dr. Ola Spurr   Lymphangitis, Adult Lymphangitis is inflammation of one or more lymph vessels. This condition is usually caused by a bacterial infection. The lymphatic system is part of the body's defense system (immune system). It is a network of vessels, glands, and organs that carry fluid (lymph) and other substances around the body. Lymph vessels drain into glands called lymph nodes. These nodes filter bacteria and waste products from lymph. Lymphangitis usually develops when bacteria spreads to the lymphatic system from an infected wound or scrape on the skin of the arms or legs. It can also result from a skin infection (cellulitis) that spreads to the lymph nodes. Lymphangitis can spread quickly through your lymph system and into your blood (bacteremia). What are the causes? This condition is usually caused by bacteria that get into the lymph vessels. Most often, this is streptococcus bacteria. In some cases, staphylococcus bacteria may be the cause. Many other types of bacteria can also cause lymphangitis, but that is rare. What increases the risk? The following factors may make you more likely to develop this condition:  Being female. Men are more likely to get lymphangitis caused by cellulitis.  Having a decreased ability to fight infection or a weakened immune system.  Having diabetes.  Taking drugs that suppress the immune system.  Having chickenpox.  Being weak from another illness.  What are the signs or symptoms? The most common symptom of lymphangitis is a wound or skin infection that develops red streaks in the skin. These are the infected lymph vessels. The red streaks will extend toward the lymph nodes that drain the vessels. Other symptoms may include:  Warmth and tenderness over the streaks.  Throbbing  pain.  Swollen and tender lymph nodes. ? For arm infections, these will be under the arm. ? For leg infections, these will be in the groin area.  Fever.  Chills.  Headache.  Appetite loss.  Muscle aches.  Fast pulse.  How is this diagnosed? This condition may be diagnosed based on your symptoms and a physical exam. You may also have tests, such as:  Blood tests to check for an increase in white blood cells.  Blood cultures to look for bacteremia.  Culture and sensitivity testing. This is a test to find out what type of bacteria will grow from a sample of pus swabbed from the wound or skin infection. The results help determine which antibiotic medicines will kill the bacteria.  How is this treated? Treatment for this condition may include:  Antibiotics. ? You may be started on an antibiotic that is known to kill both streptococcus and staphylococcus bacteria. ? Your antibiotics may need to be switched if tests show that your condition is caused by another type of bacteria. ? If your infection is very bad or has spread to another area of your body, you may need to get antibiotics through an IV at the hospital.  Pain medicine.  Incision and drainage. This is a procedure that may be done at the hospital if pus needs to be drained from your wound.  Follow these instructions at home:  Take over-the-counter and prescription medicines only as told by your health care provider.  Take your antibiotic medicine as told by your health care provider. Do not stop taking the antibiotic even if you start to feel better.  Rest  at home until your health care provider says that you can return to your normal activities.  Follow instructions from your health care provider about how to take care of your wound.  Raise (elevate) the affected area above the level of your heart while you are sitting or lying down.  Keep all follow-up visits as told by your health care provider. This is  important. Contact a health care provider if:  You have chills or a fever.  Your symptoms do not go away with treatment.  Your symptoms come back after treatment. This information is not intended to replace advice given to you by your health care provider. Make sure you discuss any questions you have with your health care provider. Document Released: 12/29/2015 Document Revised: 05/09/2016 Document Reviewed: 12/21/2014 Elsevier Interactive Patient Education  [employer].

## 2018-06-08 NOTE — Progress Notes (Signed)
[name] Progress Note  Subjective:  Mariah Rice is a 47 y.o. female with history of migraines, tobacco abuse, HTN who presents for follow-up tick bite. She was seen in part for this last Tuesday after noticing tick Sunday. She took doxycycline 200 mg that day as a precaution (tick likely attached < 24 hours) and then had swelling over site of bite extending to her neck the next day. She was seen by a nurse at work for her smoking who prescribed her 21 days of doxycycline 100 mg BID. Patient notes decreased swelling and about 50% improvement from initial swelling but wanted to be seen for continued swelling of lymph node. She denies trouble breathing or swallowing. No fevers (notes temperature of 99 F when saw nurse last week). There is some itching over site of bite and she has applied hydrocortisone with some improvement. No cat scratches or bites. ROS: No diarrhea, no new joint pains   Allergies  Allergen Reactions  . Sumatriptan     Reported nausea, rash, throat felt like it was closing up  . Penicillins Nausea Only and Rash    Has patient had a PCN reaction causing immediate rash, facial/tongue/throat swelling, SOB or lightheadedness with hypotension: no Has patient had a PCN reaction causing severe rash involving mucus membranes or skin necrosis:unknown Has patient had a PCN reaction that required hospitalization : yes Has patient had a PCN reaction occurring within the last 10 years: no If all of the above answers are "NO", then may proceed with Cephalosporin use.   . Sulfa Antibiotics Nausea Only and Rash    Social History   Tobacco Use  . Smoking status: Current Every Day Smoker    Packs/day: 0.50    Types: Cigarettes  . Smokeless tobacco: Never Used  Substance Use Topics  . Alcohol use: Yes    Alcohol/week: 0.0 oz    Comment: occasional    Objective: Blood pressure 138/88, pulse 60, temperature 98.6 F (37 C), temperature source Oral, height 5\' 4"   (1.626 m), weight 213 lb (96.6 kg), SpO2 95 %. Body mass index is 36.56 kg/m. Constitutional: Pleasant, well-appearing female in NAD HENT: MMM Pulmonary/Chest: Effort normal and breath sounds normal.  Neurological: AOx3, no focal deficits. Skin: Small scab anterior to right ear without surrounding erythema. Slight swelling at jawline and enlarged cervical lymph node on right that is mildly tender.  Psychiatric: Normal mood and affect.  Vitals reviewed  Assessment/Plan: Tick bite - Associated lymphadenitis, improving. Recommended continuing doxycycline for total of 10 days, which would cover for Lyme and RMSF. Counseled patient to return if swelling persists or were to worsen, as would switch to bactrim for lymphadenitis. No weight loss and began just after tick bite so do not suspect malignant process (history of tobacco abuse).  Follow-up prn.  Olene Floss, MD [place], PGY-3

## 2018-06-09 ENCOUNTER — Ambulatory Visit: Payer: BLUE CROSS/BLUE SHIELD | Admitting: Internal Medicine

## 2018-06-09 ENCOUNTER — Encounter: Payer: Self-pay | Admitting: Internal Medicine

## 2018-06-09 DIAGNOSIS — F17210 Nicotine dependence, cigarettes, uncomplicated: Secondary | ICD-10-CM | POA: Diagnosis not present

## 2018-06-09 DIAGNOSIS — Z716 Tobacco abuse counseling: Secondary | ICD-10-CM | POA: Diagnosis not present

## 2018-06-09 NOTE — Assessment & Plan Note (Addendum)
-   Associated lymphadenitis, improving. Recommended continuing doxycycline for total of 10 days, which would cover for Lyme and RMSF. Counseled patient to return if swelling persists or were to worsen, as would switch to bactrim for lymphadenitis. No weight loss and began just after tick bite so do not suspect malignant process (history of tobacco abuse).

## 2018-06-12 ENCOUNTER — Ambulatory Visit: Payer: BLUE CROSS/BLUE SHIELD | Admitting: Podiatry

## 2018-06-15 ENCOUNTER — Ambulatory Visit (INDEPENDENT_AMBULATORY_CARE_PROVIDER_SITE_OTHER): Payer: BLUE CROSS/BLUE SHIELD | Admitting: Podiatry

## 2018-06-15 ENCOUNTER — Ambulatory Visit (INDEPENDENT_AMBULATORY_CARE_PROVIDER_SITE_OTHER): Payer: BLUE CROSS/BLUE SHIELD

## 2018-06-15 ENCOUNTER — Other Ambulatory Visit: Payer: Self-pay | Admitting: Podiatry

## 2018-06-15 ENCOUNTER — Encounter: Payer: Self-pay | Admitting: Podiatry

## 2018-06-15 VITALS — BP 121/82 | HR 68

## 2018-06-15 DIAGNOSIS — M79672 Pain in left foot: Principal | ICD-10-CM

## 2018-06-15 DIAGNOSIS — M722 Plantar fascial fibromatosis: Secondary | ICD-10-CM

## 2018-06-15 DIAGNOSIS — M79671 Pain in right foot: Secondary | ICD-10-CM

## 2018-06-15 MED ORDER — DICLOFENAC SODIUM 75 MG PO TBEC: 75.0000 mg | DELAYED_RELEASE_TABLET | Freq: Two times a day (BID) | ORAL | 2 refills | Status: DC

## 2018-06-15 MED ORDER — TRIAMCINOLONE ACETONIDE 10 MG/ML IJ SUSP
10.0000 mg | Freq: Once | INTRAMUSCULAR | Status: AC
  Administered 2018-06-15: 10 mg

## 2018-06-15 NOTE — Patient Instructions (Signed)

## 2018-06-16 NOTE — Progress Notes (Signed)
Subjective:   Patient ID: Mariah Rice, female   DOB: 47 y.o.   MRN: [id number]   HPI Patient states she developed a lot of pain in the bottom of both heels and also developed some burning pain she just started taking gabapentin 100 mg.  States that the pain is worse when she first steps down on it and it seems like it is also bothering her during the night.  Patient does not smoke likes to be active   Review of Systems  All other systems reviewed and are negative.       Objective:  Physical Exam  Constitutional: She appears well-developed and well-nourished.  Cardiovascular: Intact distal pulses.  Pulmonary/Chest: Effort normal.  Musculoskeletal: Normal range of motion.  Neurological: She is alert.  Skin: Skin is warm.  Nursing note and vitals reviewed.   Neurovascular status intact muscle strength adequate range of motion within normal limits with patient noted to have discomfort plantar aspect heels but most the pain seems to be more at night or after periods of sitting.  Patient has moderate depression of the arch bilateral and I did not note significant neurological deficit     Assessment:  Fasciitis-like symptomatology with inflammation difficult to decide where it is exactly coming from but may be inflammatory or may be neurological     Plan:  H&P x-rays reviewed and today I did inject the plantar fascial bilateral 3 mg Kenalog 5 mg Xylocaine dispensed fascial brace is bilateral.  I then discussed very cushion type shoes not going barefoot and heel elevation.  Increase gabapentin dosage by 100 mg at night and we will see him back again in 3 weeks  X-ray indicates that there is small spurs but no indications of stress fracture arthritis

## 2018-06-24 ENCOUNTER — Ambulatory Visit (INDEPENDENT_AMBULATORY_CARE_PROVIDER_SITE_OTHER): Payer: BLUE CROSS/BLUE SHIELD | Admitting: Podiatry

## 2018-06-24 ENCOUNTER — Encounter: Payer: Self-pay | Admitting: Podiatry

## 2018-06-24 DIAGNOSIS — M722 Plantar fascial fibromatosis: Secondary | ICD-10-CM

## 2018-06-24 DIAGNOSIS — G629 Polyneuropathy, unspecified: Secondary | ICD-10-CM

## 2018-06-24 DIAGNOSIS — M79671 Pain in right foot: Secondary | ICD-10-CM

## 2018-06-24 DIAGNOSIS — M79672 Pain in left foot: Secondary | ICD-10-CM

## 2018-06-24 MED ORDER — TRIAMCINOLONE ACETONIDE 10 MG/ML IJ SUSP
10.0000 mg | Freq: Once | INTRAMUSCULAR | Status: AC
  Administered 2018-06-24: 10 mg

## 2018-06-24 MED ORDER — GABAPENTIN 300 MG PO CAPS: 300.0000 mg | ORAL_CAPSULE | Freq: Three times a day (TID) | ORAL | 3 refills | Status: DC

## 2018-06-25 NOTE — Progress Notes (Signed)
Subjective:   Patient ID: Mariah Rice, female   DOB: 47 y.o.   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  States that the injections helped her temporarily but she does not feel like she has enough cushion in her feet and her feet are very sore when she gets up in the morning she feels like they are not stretch properly despite her doing exercises at home   ROS      Objective:  Physical Exam  Neurovascular status intact with patient noted to have continued discomfort in the mid and central band of the plantar fascial bilateral with fluid buildup and upon questioning possibility for nerve involvement with patient having history of back problems     Assessment:  Appears to continue to be an acute fasciitis condition with moderate obesity also complicating factor and the fact that 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  At this point I did go ahead for the acute inflammation I reinjected the plantar fascial bilateral 3 mg Kenalog 5 mg Xylocaine.  I then dispensed night splints for usage at home and I gave instructions on ice therapy and I want her to incorporate with this and I went ahead and I scanned her for a custom orthotic device to try to reduce the plantar pressures on the heel and take stress off the plantar fascia in the arch itself.  I then placed her on gabapentin 300 mg 3 times daily as she seems to be tolerating the 100 mg well and we will see whether this could help the neuropathic component of her condition.  We may be able to help her more trying to keep her at work but at this point I do not know the resolution of the symptoms

## 2018-06-27 ENCOUNTER — Other Ambulatory Visit: Payer: Self-pay | Admitting: Internal Medicine

## 2018-06-27 DIAGNOSIS — I10 Essential (primary) hypertension: Secondary | ICD-10-CM

## 2018-06-29 ENCOUNTER — Ambulatory Visit: Payer: BLUE CROSS/BLUE SHIELD | Admitting: Podiatry

## 2018-06-29 ENCOUNTER — Telehealth: Payer: Self-pay | Admitting: Podiatry

## 2018-06-29 NOTE — Telephone Encounter (Signed)
I'm a pt of Dr. Mellody Drown and he put me on gabapentin 2-3 times daily. I wanted to know if that can cause my legs to hurt? It feels like I have a charlie horse in both of my legs. If you would please call me back at [phone]. Thank you so much. Bye bye.

## 2018-06-30 NOTE — Telephone Encounter (Signed)
Unable to leave a message the mailbox is full and not accepting messages.

## 2018-07-14 ENCOUNTER — Other Ambulatory Visit: Payer: BLUE CROSS/BLUE SHIELD | Admitting: Orthotics

## 2018-07-15 ENCOUNTER — Other Ambulatory Visit: Payer: BLUE CROSS/BLUE SHIELD | Admitting: Orthotics

## 2018-07-16 DIAGNOSIS — Z716 Tobacco abuse counseling: Secondary | ICD-10-CM | POA: Diagnosis not present

## 2018-07-16 DIAGNOSIS — F17210 Nicotine dependence, cigarettes, uncomplicated: Secondary | ICD-10-CM | POA: Diagnosis not present

## 2018-07-16 DIAGNOSIS — Z7189 Other specified counseling: Secondary | ICD-10-CM | POA: Diagnosis not present

## 2018-07-16 DIAGNOSIS — E669 Obesity, unspecified: Secondary | ICD-10-CM | POA: Diagnosis not present

## 2018-07-16 DIAGNOSIS — Z719 Counseling, unspecified: Secondary | ICD-10-CM | POA: Diagnosis not present

## 2018-07-16 DIAGNOSIS — Z008 Encounter for other general examination: Secondary | ICD-10-CM | POA: Diagnosis not present

## 2018-08-14 ENCOUNTER — Other Ambulatory Visit: Payer: Self-pay | Admitting: Internal Medicine

## 2018-08-14 DIAGNOSIS — Z90722 Acquired absence of ovaries, bilateral: Secondary | ICD-10-CM

## 2018-08-19 ENCOUNTER — Ambulatory Visit (INDEPENDENT_AMBULATORY_CARE_PROVIDER_SITE_OTHER): Payer: BLUE CROSS/BLUE SHIELD | Admitting: Family Medicine

## 2018-08-19 ENCOUNTER — Encounter: Payer: Self-pay | Admitting: Family Medicine

## 2018-08-19 VITALS — BP 130/80 | HR 68 | Temp 98.1°F | Ht 64.0 in | Wt 220.8 lb

## 2018-08-19 DIAGNOSIS — Z23 Encounter for immunization: Secondary | ICD-10-CM

## 2018-08-19 DIAGNOSIS — R609 Edema, unspecified: Secondary | ICD-10-CM | POA: Insufficient documentation

## 2018-08-19 DIAGNOSIS — R7303 Prediabetes: Secondary | ICD-10-CM | POA: Diagnosis not present

## 2018-08-19 DIAGNOSIS — R601 Generalized edema: Secondary | ICD-10-CM

## 2018-08-19 DIAGNOSIS — M545 Low back pain, unspecified: Secondary | ICD-10-CM

## 2018-08-19 LAB — POCT GLYCOSYLATED HEMOGLOBIN (HGB A1C): Hemoglobin A1C: 5.5 % (ref 4.0–5.6)

## 2018-08-19 NOTE — Assessment & Plan Note (Signed)
Possibly due to ibuprofen and new psychiatric medications.  Her weight seems to fluctuate over the years.   Will check labs to rule out systemic causes.  Cut back on ibuprofen and salt and monitor.

## 2018-08-19 NOTE — Progress Notes (Signed)
Subjective  Mariah Rice is a 47 y.o. female is presenting with the following  BACK PAIN  Back pain began months ago. Pain is described as achy across lower back down her legs to mid thigh.   Patient has tried ibuprofen. Pain radiates as above but not down leg . History of trauma or injury: no Patient believes might be causing their pain: works on concrete floors  Prior history of similar pain: yes History of cancer: no Weak immune system:  no History of IV drug use: no History of steroid use: nio  Symptoms Incontinence of bowel or bladder:  no Numbness of leg: no Fever: no Rest or Night pain: no Weight Loss:  no Rash: no   EDEMA For years on and off.  Worse recently in hands and feet.  Her weight fluctuates every few months.  No chest pain or shortness of breath.  No bleeding.  Using ibuprofen 800 mg for back pain.  Also recently started Latuda and unknown mood stabilizer by her psychiatrist Dr Ky Barban.  No difference between left and right sides.  Feet swelling is better in am and worse in pm.    Chief Complaint noted Review of Symptoms - see HPI PMH - Smoking status noted.    Objective Vital Signs reviewed BP 130/80   Pulse 68   Temp 98.1 F (36.7 C)   Ht 5\' 4"  (1.626 m)   Wt 220 lb 12.8 oz (100.2 kg)   LMP  (LMP Unknown) Comment: on Megace  SpO2 97%   BMI 37.90 kg/m  Alert nad Has mildly decreased range of motion with forward flexiion of back   Back - Normal skin, Spine with normal alignment and no deformity.  No tenderness to vertebral process palpation.  Paraspinous muscles are mildly tender and without spasm.   Neurologic exam : Strength equal & normal in upper & lower extremities Able to walk on heels and toes.   Balance normal  able to get up and down from exam table without assistance or evident pain Abdomen: obese soft and non-tender without masses, organomegaly or hernias noted.  No guarding or rebound Extrem - 1 + pitting edema at ankles with  lipidema throughout.  No discernable hand edema   Assessments/Plans  See after visit summary for details of patient instuctions  Back pain Recurrent worsened.  Seems consistent with musculoskeletal pain without evidence of focal bony lesions or nerve compression.  See after visit summary for plan.  If persists or worsens may need xray.  Edema Possibly due to ibuprofen and new psychiatric medications.  Her weight seems to fluctuate over the years.   Will check labs to rule out systemic causes.  Cut back on ibuprofen and salt and monitor.

## 2018-08-19 NOTE — Assessment & Plan Note (Addendum)
Recurrent worsened.  Seems consistent with musculoskeletal pain without evidence of focal bony lesions or nerve compression.  See after visit summary for plan.  If persists or worsens may need xray.

## 2018-08-19 NOTE — Patient Instructions (Addendum)
Good to see you today!  Thanks for coming in.  For the Swelling - will check blood tests - I will call you if your tests are not good.  Otherwise I will send you a letter.  If you do not hear from me with in 2 weeks please call our office.     - Cut back on ibuprofen 1/2 tab twice a day at the most. dont take diclofenac  - take tylenol 2 tabs three times a day as needed for pain   For Back Pain - use the tylenol for pain - back exercises and walking or swimming  - if not better in a month or if worsening or any weakness or trouble controlling bowels or bladder see [place]   Make an appointment to see Dr Shan Levans - to follow up with the pre-diabetes - in one month.

## 2018-08-20 ENCOUNTER — Encounter: Payer: Self-pay | Admitting: Family Medicine

## 2018-08-20 LAB — CMP14+EGFR
ALBUMIN: 4.1 g/dL (ref 3.5–5.5)
ALK PHOS: 98 IU/L (ref 39–117)
ALT: 34 IU/L — ABNORMAL HIGH (ref 0–32)
AST: 27 IU/L (ref 0–40)
Albumin/Globulin Ratio: 1.6 (ref 1.2–2.2)
BUN/Creatinine Ratio: 10 (ref 9–23)
BUN: 8 mg/dL (ref 6–24)
Bilirubin Total: 0.3 mg/dL (ref 0.0–1.2)
CO2: 23 mmol/L (ref 20–29)
CREATININE: 0.78 mg/dL (ref 0.57–1.00)
Calcium: 8.9 mg/dL (ref 8.7–10.2)
Chloride: 92 mmol/L — ABNORMAL LOW (ref 96–106)
GFR, EST AFRICAN AMERICAN: 107 mL/min/{1.73_m2} (ref >59)
GFR, EST NON AFRICAN AMERICAN: 93 mL/min/{1.73_m2} (ref >59)
GLOBULIN, TOTAL: 2.6 g/dL (ref 1.5–4.5)
Glucose: 89 mg/dL (ref 65–99)
POTASSIUM: 3.9 mmol/L (ref 3.5–5.2)
Sodium: 131 mmol/L — ABNORMAL LOW (ref 134–144)
Total Protein: 6.7 g/dL (ref 6.0–8.5)

## 2018-08-20 LAB — CBC
HEMOGLOBIN: 13.3 g/dL (ref 11.1–15.9)
Hematocrit: 38.1 % (ref 34.0–46.6)
MCH: 31 pg (ref 26.6–33.0)
MCHC: 34.9 g/dL (ref 31.5–35.7)
MCV: 89 fL (ref 79–97)
Platelets: 317 10*3/uL (ref 150–450)
RBC: 4.29 x10E6/uL (ref 3.77–5.28)
RDW: 13.7 % (ref 12.3–15.4)
WBC: 7.4 10*3/uL (ref 3.4–10.8)

## 2018-08-20 LAB — TSH: TSH: 3.01 u[IU]/mL (ref 0.450–4.500)

## 2018-08-27 DIAGNOSIS — F17210 Nicotine dependence, cigarettes, uncomplicated: Secondary | ICD-10-CM | POA: Diagnosis not present

## 2018-08-27 DIAGNOSIS — Z716 Tobacco abuse counseling: Secondary | ICD-10-CM | POA: Diagnosis not present

## 2018-08-30 ENCOUNTER — Other Ambulatory Visit: Payer: Self-pay | Admitting: Internal Medicine

## 2018-08-30 DIAGNOSIS — G43801 Other migraine, not intractable, with status migrainosus: Secondary | ICD-10-CM

## 2018-09-03 ENCOUNTER — Other Ambulatory Visit: Payer: Self-pay

## 2018-09-03 ENCOUNTER — Encounter: Payer: Self-pay | Admitting: Family Medicine

## 2018-09-03 ENCOUNTER — Ambulatory Visit (INDEPENDENT_AMBULATORY_CARE_PROVIDER_SITE_OTHER): Payer: BLUE CROSS/BLUE SHIELD | Admitting: Family Medicine

## 2018-09-03 DIAGNOSIS — M545 Low back pain, unspecified: Secondary | ICD-10-CM

## 2018-09-03 MED ORDER — CYCLOBENZAPRINE HCL 10 MG PO TABS: 10.0000 mg | ORAL_TABLET | Freq: Every day | ORAL | 3 refills | Status: DC

## 2018-09-03 MED ORDER — MELOXICAM 15 MG PO TABS: 15.0000 mg | ORAL_TABLET | Freq: Every day | ORAL | 3 refills | Status: DC

## 2018-09-03 NOTE — Assessment & Plan Note (Signed)
Patient complaining of stabbing left lumbar back pain with radiation into her left leg > right for three weeks now. Patient denies improvement with

## 2018-09-03 NOTE — Patient Instructions (Signed)
I sent a new prescription for flexeril and you can take three times per day. Also meloxicam which is a great once-a-day ibuprofen like drug You can also keep taking the tylenol. Remember those gentle stretching exercises that we discussed. I will call with the x ray results. Good luck.  There are no good answers for back pain.

## 2018-09-03 NOTE — Progress Notes (Signed)
Subjective:   Patient ID: Mariah Rice    DOB: 12/30/1970, 48 y.o. female   MRN: [id number]  Chief Complaint: back pain and legs   History of Present Illness: Mariah Rice is a 47 y.o. female who presents to clinic today complaining of worsening back pain for 3 weeks now.  Patient describes 8/10 stabbing pain primarily at the left lower back that radiates into her left leg > right down to the ankle. Pain exacerbated with movement, especially bending. Patient stands at work for [employer]; she has been alternating putting her feet up on a bucket as she stands to offload her back. Patient reports treatment at home with Tylenol, heating pads and gabapentin, but denies any relief. Patient states that she has not been able to exercise of stretch due to pain. Patient denies any trauma or other inciting event. Patient reports an intermittent history of back pain, but has never had pain this severe.   Patient was seen in the office 08/19/2018, about two weeks ago for her back. Patient was encouraged to walk and swim and use tylenol instead of ibuprofen, due to adverse effects. Plans to image if patient did not improve over the next few weeks.   Patient denies red flag symptoms: no fever, unexplained weight loss, urinary incontinence or retention, bowel incontinence, saddle paresthesia, weakness or other neurological deficits, personal history of cancer, immunocompromising diseases    Review of Systems  Constitutional: Negative for fever.  HENT: Negative for sore throat.   Respiratory: Negative for cough.   Gastrointestinal: Negative for blood in stool, constipation, diarrhea, melena, nausea and vomiting.  Genitourinary: Negative for dysuria, frequency, hematuria and urgency.  Musculoskeletal: Positive for back pain. Negative for joint pain and myalgias.  Skin: Negative for rash.  Neurological: Positive for tingling. Negative for weakness.    Past Medical History:  Diagnosis Date  . Anxiety   .  Dental abscess   . Depression   . GERD (gastroesophageal reflux disease)   . Headache   . Hypertension   . Hyperthyroidism 1990s  . PONV (postoperative nausea and vomiting)   . Shortness of breath dyspnea    with anxiety     Current Outpatient Medications  Medication Sig Dispense Refill  . busPIRone (BUSPAR) 7.5 MG tablet Take 1 tablet (7.5 mg total) by mouth 3 (three) times daily.    . [employer] NICOTINE POLACRILEX 4 MG gum 1 PIECE AS NEEDED PER PACKAGE DIRECTIONS 24 TIME(S) A DAY MOUTH/THROAT  1  . cyclobenzaprine (FLEXERIL) 10 MG tablet Take 1 tablet (10 mg total) by mouth at bedtime. 30 tablet 1  . estradiol (ESTRACE) 2 MG tablet Take 1 tablet (2 mg total) by mouth daily. 90 tablet 3  . estradiol (ESTRACE) 2 MG tablet TAKE 1 TABLET BY MOUTH DAILY 30 tablet 8  . fluticasone (FLONASE) 50 MCG/ACT nasal spray Place 2 sprays into both nostrils daily. 16 g 11  . gabapentin (NEURONTIN) 300 MG capsule Take 1 capsule (300 mg total) by mouth 3 (three) times daily. 90 capsule 0  . gabapentin (NEURONTIN) 300 MG capsule Take 1 capsule (300 mg total) by mouth 3 (three) times daily. 90 capsule 3  . hydrOXYzine (ATARAX/VISTARIL) 25 MG tablet Take 25 mg by mouth 3 (three) times daily.    Marland Kitchen ibuprofen (ADVIL,MOTRIN) 800 MG tablet Take 1 tablet (800 mg total) by mouth 2 (two) times daily as needed. 60 tablet 6  . lurasidone (LATUDA) 40 MG TABS tablet Take 1 tablet (40 mg total)  by mouth daily with breakfast. 30 tablet   . omeprazole (PRILOSEC) 40 MG capsule TAKE ONE CAPSULE BY MOUTH EVERY DAY    . ondansetron (ZOFRAN ODT) 4 MG disintegrating tablet Take 1 tablet (4 mg total) by mouth every 8 (eight) hours as needed for nausea or vomiting. 20 tablet 0  . pravastatin (PRAVACHOL) 40 MG tablet Take 1 tablet (40 mg total) by mouth daily. 90 tablet 3  . prazosin (MINIPRESS) 2 MG capsule Take 1 capsule (2 mg total) by mouth at bedtime.    . propranolol ER (INDERAL LA) 80 MG 24 hr capsule TAKE 1 CAPSULE BY MOUTH  EVERY DAY 30 capsule 2  . terbinafine (LAMISIL) 1 % cream Apply 1 application topically 2 (two) times daily. 30 g 0  . triamterene-hydrochlorothiazide (MAXZIDE-25) 37.5-25 MG tablet Take 1 tablet by mouth daily. 90 tablet 3  . triamterene-hydrochlorothiazide (MAXZIDE-25) 37.5-25 MG tablet TAKE 1 TABLET BY MOUTH EVERY DAY 30 tablet 2   No current facility-administered medications for this visit.     Objective:  BP 130/88   Pulse 65   Temp 98.2 F (36.8 C) (Oral)   Ht 5\' 4"  (1.626 m)   Wt 223 lb 3.2 oz (101.2 kg)   LMP  (LMP Unknown) Comment: on Megace  SpO2 97%   BMI 38.31 kg/m   Physical Exam  Constitutional: She appears well-developed and well-nourished.  Cardiovascular: Normal rate and regular rhythm.  Pulmonary/Chest: Effort normal and breath sounds normal.  Musculoskeletal:  Back:  No bony tenderness of the spine. No overlying skin changes, lesions, rashes, erythema or edema. Range of motion at hips limited secondary to pain Antalgic gait, weight bearing to the right, unable to access heel or toe walking due to pain Normal patellar and achilles reflexes   Normal strength and sensation in the lower extremities Unable to perform FABER test due to pain and body habitus Spasm of paraspinal muscles   Nursing note and vitals reviewed.   Assessment & Plan:  Mariah Rice is a 47 y.o. woman who presents to clinic today complaining of back pain for three weeks now.  No problem-specific Assessment & Plan notes found for this encounter.  Orders Placed This Encounter  Procedures  . DG Lumbar Spine Complete    Standing Status:   Future    Standing Expiration Date:   11/04/2019    Order Specific Question:   Reason for Exam (SYMPTOM  OR DIAGNOSIS REQUIRED)    Answer:   low back pain    Order Specific Question:   Is patient pregnant?    Answer:   No    Order Specific Question:   Preferred imaging location?    Answer:   [facility]    Order Specific Question:    Radiology Contrast Protocol - do NOT remove file path    Answer:   [email or web address]   Meds ordered this encounter  Medications  . cyclobenzaprine (FLEXERIL) 10 MG tablet    Sig: Take 1 tablet (10 mg total) by mouth at bedtime.    Dispense:  90 tablet    Refill:  3  . meloxicam (MOBIC) 15 MG tablet    Sig: Take 1 tablet (15 mg total) by mouth daily.    Dispense:  30 tablet    Refill:  [facility] 09/03/2018 11:38 AM   I have reviewed and agree.

## 2018-09-04 ENCOUNTER — Encounter: Payer: Self-pay | Admitting: Family Medicine

## 2018-09-04 NOTE — Progress Notes (Signed)
Patient ID: Mariah Rice, female   DOB: July 25, 1971, 47 y.o.   MRN: [id number] I was physically present and/or repeated all elements of the H&PE by MS 3 [name].  I agree with her documentation and management.  [name] has an acute flair of her chronic intermitant low back pain.  Never imaged.  We will get LS spine films to confirm my clinical suspicion of DDD and facet arthopathy.  Will Rx conservatively with NSAID, muscle relaxer and gentle stretching exercises.

## 2018-09-21 ENCOUNTER — Other Ambulatory Visit: Payer: Self-pay | Admitting: Family Medicine

## 2018-09-21 DIAGNOSIS — I10 Essential (primary) hypertension: Secondary | ICD-10-CM

## 2018-09-25 ENCOUNTER — Other Ambulatory Visit: Payer: Self-pay

## 2018-09-25 DIAGNOSIS — G43801 Other migraine, not intractable, with status migrainosus: Secondary | ICD-10-CM

## 2018-09-25 MED ORDER — PROPRANOLOL HCL ER 80 MG PO CP24: ORAL_CAPSULE | ORAL | 3 refills | Status: DC

## 2018-09-29 ENCOUNTER — Other Ambulatory Visit: Payer: Self-pay | Admitting: Family Medicine

## 2018-09-29 DIAGNOSIS — Z90722 Acquired absence of ovaries, bilateral: Secondary | ICD-10-CM

## 2018-10-05 ENCOUNTER — Other Ambulatory Visit: Payer: Self-pay | Admitting: Family Medicine

## 2018-10-05 DIAGNOSIS — I10 Essential (primary) hypertension: Secondary | ICD-10-CM

## 2018-10-07 ENCOUNTER — Ambulatory Visit: Payer: BLUE CROSS/BLUE SHIELD | Admitting: Family Medicine

## 2018-10-14 ENCOUNTER — Other Ambulatory Visit: Payer: Self-pay

## 2018-10-14 ENCOUNTER — Encounter: Payer: Self-pay | Admitting: Family Medicine

## 2018-10-14 ENCOUNTER — Ambulatory Visit (INDEPENDENT_AMBULATORY_CARE_PROVIDER_SITE_OTHER): Payer: BLUE CROSS/BLUE SHIELD | Admitting: Family Medicine

## 2018-10-14 DIAGNOSIS — M545 Low back pain, unspecified: Secondary | ICD-10-CM

## 2018-10-14 NOTE — Patient Instructions (Addendum)
It was nice meeting you today Mariah Rice!  For your back pain, try increasing her dose of gabapentin.  I would increase it slowly, with the maximum per day being 4 pills 3 times a day.  Also look on YouTube for yoga for back pain and follow these videos to help improve your range of motion and relieve some of your pain.  Whenever you get your x-ray, I will let you know what the results are once they send it to me.  If you have any questions or concerns, please feel free to call the clinic.   Be well,  Dr. Shan Levans

## 2018-10-14 NOTE — Progress Notes (Signed)
Subjective:    Mariah Rice - 47 y.o. female MRN [id number]  Date of birth: May 02, 1971  CC: back pain  HPI  Mariah Rice is here for all up of her back pain.  This is the third visit recently that she has had for this complaint.  She says that she continues to have this pain, and the meloxicam made her so sick to her stomach with nausea and vomiting that she had to leave work.  She has not been able to do the stretching exercises because these are too painful.  She says that walking makes the pain worse, as well as standing for prolonged periods at work, which she is required to do.  She says the floor at her work is concrete.  Her pain radiates down her posterior left thigh.  The pain in her posterior thigh is the same intensity as the pain in the middle of her lumbar spine.  She did not get that x-ray because she thought that they were supposed to call her for this.     Health Maintenance:  There are no preventive care reminders to display for this patient.  -  reports that she has been smoking cigarettes. She has been smoking about 0.50 packs per day. She has never used smokeless tobacco. - Review of Systems: Per HPI. - Past Medical History: Patient Active Problem List   Diagnosis Date Noted  . Edema 08/19/2018  . Low back pain potentially associated with radiculopathy 06/06/2018  . Prediabetes 05/31/2018  . Tinea pedis of both feet 05/31/2018  . Muscle strain of left gluteal region 03/20/2018  . Migraine 03/20/2018  . Left hip pain 02/11/2018  . Migraine without aura and with status migrainosus, not intractable 11/30/2017  . Diarrhea of presumed infectious origin 09/19/2017  . Skin tag 08/05/2017  . Tick bite 05/13/2017  . Numbness and tingling in both hands 01/29/2017  . Rosacea 10/14/2016  . Mood disorder ([place]) 10/06/2016  . Tingling sensation 08/28/2016  . Rash and nonspecific skin eruption 12/27/2015  . Pain in finger of left hand 12/27/2015  . GERD  (gastroesophageal reflux disease) 10/19/2015  . H/O bilateral oophorectomy 08/13/2015  . S/P laparoscopic assisted vaginal hysterectomy (LAVH) 07/11/2015  . FH: ovarian cancer in first degree relative 05/04/2015  . Back pain 03/15/2015  . Dermoid cyst of arm 12/19/2014  . Environmental allergies 09/30/2013  . Depression 01/06/2013  . GAD (generalized anxiety disorder) 01/06/2013  . Headache 11/24/2012  . Tobacco abuse 11/10/2012  . Overweight 11/10/2012  . Hypertension 11/10/2012   - Medications: reviewed and updated   Objective:   Physical Exam BP (!) 144/86   Pulse 71   Temp 98.1 F (36.7 C) (Oral)   Ht 5\' 4"  (1.626 m)   Wt 221 lb 9.6 oz (100.5 kg)   LMP  (LMP Unknown) Comment: on Megace  SpO2 97%   BMI 38.04 kg/m  Gen: NAD, alert, cooperative with exam, well-appearing CV: RRR, good S1/S2, no murmur  Resp: CTABL, no wheezes, non-labored Musculoskeletal: Severely limited range of motion with lumbar flexion, extension, lateral rotation bilaterally, and lateral extension bilaterally.  Tender to palpation in the middle of her lumbar region without much paraspinal muscle tenderness. Neuro: no gross deficits.  Psych: good insight, alert and oriented        Assessment & Plan:   Low back pain potentially associated with radiculopathy Discussed with patient that they are often very limited interventions that we can do for back pain unfortunately.  Encouraged her to get the lumbar x-rays, and she plans to get them tomorrow.  We will also increase her gabapentin dose for her radiculopathy, with patient planning to try 2 pills 3 times daily with increase up to 4 pills 3 times daily if needed.  Patient says that she works too much to be able to go to PT currently.  Recommended that patient use YouTube to find yoga for back pain, and patient seemed interested in this idea.    Maia Breslow, M.D. 10/14/2018, 12:00 PM PGY-2, [place]

## 2018-10-14 NOTE — Assessment & Plan Note (Signed)
Discussed with patient that they are often very limited interventions that we can do for back pain unfortunately.  Encouraged her to get the lumbar x-rays, and she plans to get them tomorrow.  We will also increase her gabapentin dose for her radiculopathy, with patient planning to try 2 pills 3 times daily with increase up to 4 pills 3 times daily if needed.  Patient says that she works too much to be able to go to PT currently.  Recommended that patient use YouTube to find yoga for back pain, and patient seemed interested in this idea.

## 2018-10-19 ENCOUNTER — Ambulatory Visit
Admission: RE | Admit: 2018-10-19 | Discharge: 2018-10-19 | Disposition: A | Discharge status: Home / Self Care | Payer: BLUE CROSS/BLUE SHIELD | Source: Ambulatory Visit | Attending: Family Medicine | Admitting: Family Medicine

## 2018-10-19 DIAGNOSIS — M545 Low back pain, unspecified: Secondary | ICD-10-CM

## 2018-10-19 DIAGNOSIS — M48061 Spinal stenosis, lumbar region without neurogenic claudication: Secondary | ICD-10-CM | POA: Diagnosis not present

## 2018-10-20 ENCOUNTER — Telehealth: Payer: Self-pay | Admitting: Family Medicine

## 2018-10-20 NOTE — Telephone Encounter (Signed)
Called Mariah Rice to discuss her lumbar spine x-ray results.  I informed her that she has several areas that show arthritic changes and reiterated that we do not have any great medicines for arthritis, but doing back strengthening and back stretching will be helpful in the long run.  Once her work schedule becomes more manageable, we will consider starting physical therapy if she is amenable.

## 2018-12-20 ENCOUNTER — Other Ambulatory Visit: Payer: Self-pay | Admitting: Family Medicine

## 2018-12-20 DIAGNOSIS — I10 Essential (primary) hypertension: Secondary | ICD-10-CM

## 2018-12-22 ENCOUNTER — Encounter: Payer: Self-pay | Admitting: Family Medicine

## 2018-12-22 ENCOUNTER — Ambulatory Visit (INDEPENDENT_AMBULATORY_CARE_PROVIDER_SITE_OTHER): Payer: BLUE CROSS/BLUE SHIELD | Admitting: Family Medicine

## 2018-12-22 VITALS — BP 132/78 | HR 80 | Temp 98.0°F | Ht 64.0 in | Wt 202.2 lb

## 2018-12-22 DIAGNOSIS — R634 Abnormal weight loss: Secondary | ICD-10-CM | POA: Diagnosis not present

## 2018-12-22 DIAGNOSIS — R1110 Vomiting, unspecified: Secondary | ICD-10-CM | POA: Diagnosis not present

## 2018-12-22 MED ORDER — ONDANSETRON HCL 4 MG PO TABS: 4.0000 mg | ORAL_TABLET | Freq: Three times a day (TID) | ORAL | 2 refills | Status: DC | PRN

## 2018-12-22 NOTE — Progress Notes (Signed)
Subjective:    Patient ID: Mariah Rice, female    DOB: [birth year], 48 y.o.   MRN: [id number]   CC: Nausea/vomiting x2 months with weight loss  HPI: Mariah Rice is a pleasant 48 year old female presenting today for 72-month history of nausea and vomiting, as well as 20 pound weight loss since October 2019.  She states she in general "does not feel well" and that foods do not taste right.  She is nauseous and vomits about 4 times each week.  She denies any patterns with the nausea or vomiting, which can occur anytime of day with or without food in her stomach.  There is no specific etiology the patient can think of that causes her symptoms.  She denies gassiness, abdominal cramping, difficulties urinating, difficulties swallowing, melena, blood in stool, stool changes, dizziness, night sweats, bleeding, shortness of breath, and chest pain.  She states that greasy foods may be cause minimal abdominal discomfort.  She reports that her hair is brittle and falls out, but this is an old problem.  She denies any skin changes or known food intolerances.  She denies sick contacts or recent travel.  Smoking status reviewed: Patient is current 10 cigarette/day smoker, down from 1 pack/day  Review of Systems: See HPI   Objective:  BP 132/78   Pulse 80   Temp 98 F (36.7 C) (Oral)   Ht 5\' 4"  (1.626 m)   Wt 91.7 kg   LMP  (LMP Unknown) Comment: on Megace  SpO2 98%   BMI 34.71 kg/m   Physical Exam Cardiovascular:     Rate and Rhythm: Normal rate and regular rhythm.     Pulses: Normal pulses.     Heart sounds: Normal heart sounds.  Pulmonary:     Effort: Pulmonary effort is normal.     Breath sounds: Normal breath sounds.  Abdominal:     General: Bowel sounds are normal. There is no distension.     Palpations: There is no mass.     Tenderness: There is no abdominal tenderness. There is no right CVA tenderness, left CVA tenderness, guarding or rebound.  Musculoskeletal:        General: No  swelling, deformity or signs of injury.  Skin:    General: Skin is warm and dry.  Neurological:     General: No focal deficit present.  Psychiatric:        Mood and Affect: Mood normal.        Behavior: Behavior normal.        Thought Content: Thought content normal.        Judgment: Judgment normal.    Assessment & Plan:   Intermittent vomiting Patient reports constant nausea with 4 episodes of emesis weekly since October 2019.  As there is no pattern with the nausea/vomiting, exact etiology is currently unknown. -Patient was encouraged to eat foods that do not upset her stomach -Patient instructed to keep a food diary of what she eats during the day and when she has the nausea/vomiting. -Patient referred to GI for further work-up and possible scopes. -Patient prescribed Zofran 4 mg as needed for nausea/vomiting  Unintentional weight loss The patient reports about 20 pound weight loss since October 2019.  Based on the patient's vague symptoms and past medical history, as well as smoking history, the differential remains broad and should include cancer (lung, colon), hyperthyroidism, depression/anxiety, food intolerance, inflammatory bowel disease, and irritable bowel syndrome.  -Referral to GI for weight loss and scopes -Labs  were drawn today testing TSH, HIV, hepatitis C, CMP, and CBC.   Return if symptoms worsen or fail to improve.   Dr. Milus Banister [facility], PGY-1

## 2018-12-22 NOTE — Patient Instructions (Signed)
Thank you for coming in to see [place] today! Please see below to review our plan for today's visit:  1.  I will contact you with the results of your labs. 2.  Take Zofran 4 mg every 8 hours as needed for nausea/vomiting. 3.  Follow-up with gastroenterology outpatient for scopes and further management of nausea/vomiting.  Please call the clinic at [phone] if your symptoms worsen or you have any concerns. It was our pleasure to serve you!     Dr. Milus Banister [facility]   Nausea, Adult Nausea is feeling sick to your stomach or feeling that you are about to throw up (vomit). Feeling sick to your stomach is usually not serious, but it may be an early sign of a more serious medical problem. As you feel sicker to your stomach, you may throw up. If you throw up, or if you are not able to drink enough fluids, there is a risk that you may lose too much water in your body (get dehydrated). If you lose too much water in your body, you may:  Feel tired.  Feel thirsty.  Have a dry mouth.  Have cracked lips.  Go pee (urinate) less often. Older adults and people who have other diseases or a weak body defense system (immune system) have a higher risk of losing too much water in the body. The main goals of treating this condition are:  To relieve your nausea.  To ensure your nausea occurs less often.  To prevent throwing up and losing too much fluid. Follow these instructions at home: Watch your symptoms for any changes. Tell your doctor about them. Follow these instructions as told by your doctor. Eating and drinking      Take an ORS (oral rehydration solution). This is a drink that is sold at pharmacies and stores.  Drink clear fluids in small amounts as you are able. These include: ? Water. ? Ice chips. ? Fruit juice that has water added (diluted fruit juice). ? Low-calorie sports drinks.  Eat bland, easy-to-digest foods in small amounts as you are able,  such as: ? Bananas. ? Applesauce. ? Rice. ? Low-fat (lean) meats. ? Toast. ? Crackers.  Avoid drinking fluids that have a lot of sugar or caffeine in them. This includes energy drinks, sports drinks, and soda.  Avoid alcohol.  Avoid spicy or fatty foods. General instructions  Take over-the-counter and prescription medicines only as told by your doctor.  Rest at home while you get better.  Drink enough fluid to keep your pee (urine) pale yellow.  Take slow and deep breaths when you feel sick to your stomach.  Avoid food or things that have strong smells.  Wash your hands often with soap and water. If you cannot use soap and water, use hand sanitizer.  Make sure that all people in your home wash their hands well and often.  Keep all follow-up visits as told by your doctor. This is important. Contact a doctor if:  You feel sicker to your stomach.  You feel sick to your stomach for more than 2 days.  You throw up.  You are not able to drink fluids without throwing up.  You have new symptoms.  You have a fever.  You have a headache.  You have muscle cramps.  You have a rash.  You have pain while peeing.  You feel light-headed or dizzy. Get help right away if:  You have pain in your chest, neck, arm, or  jaw.  You feel very weak or you pass out (faint).  You have throw up that is bright red or looks like coffee grounds.  You have bloody or black poop (stools) or poop that looks like tar.  You have a very bad headache, a stiff neck, or both.  You have very bad pain, cramping, or bloating in your belly (abdomen).  You have trouble breathing or you are breathing very quickly.  Your heart is beating very quickly.  Your skin feels cold and clammy.  You feel confused.  You have signs of losing too much water in your body, such as: ? Dark pee, very little pee, or no pee. ? Cracked lips. ? Dry mouth. ? Sunken eyes. ? Sleepiness. ? Weakness. These  symptoms may be an emergency. Do not wait to see if the symptoms will go away. Get medical help right away. Call your local emergency services (911 in the [place] Do not drive yourself to the hospital. Summary  Nausea is feeling sick to your stomach or feeling that you are about to throw up (vomit).  If you throw up, or if you are not able to drink enough fluids, there is a risk that you may lose too much water in your body (get dehydrated).  Eat and drink what your doctor tells you. Take over-the-counter and prescription medicines only as told by your doctor.  Contact a doctor right away if your symptoms get worse or you have new symptoms.  Keep all follow-up visits as told by your doctor. This is important.

## 2018-12-22 NOTE — Assessment & Plan Note (Signed)
The patient reports about 20 pound weight loss since October 2019.  Based on the patient's vague symptoms and past medical history, as well as smoking history, the differential remains broad and should include cancer (lung, colon), hyperthyroidism, depression/anxiety, food intolerance, inflammatory bowel disease, and irritable bowel syndrome.  -Referral to GI for weight loss and scopes -Labs were drawn today testing TSH, HIV, hepatitis C, CMP, and CBC.

## 2018-12-22 NOTE — Assessment & Plan Note (Addendum)
Patient reports constant nausea with 4 episodes of emesis weekly since October 2019.  As there is no pattern with the nausea/vomiting, exact etiology is currently unknown. -Patient was encouraged to eat foods that do not upset her stomach -Patient instructed to keep a food diary of what she eats during the day and when she has the nausea/vomiting. -Patient referred to GI for further work-up and possible scopes. -Patient prescribed Zofran 4 mg as needed for nausea/vomiting

## 2018-12-23 LAB — CBC WITH DIFFERENTIAL/PLATELET
BASOS ABS: 0 10*3/uL (ref 0.0–0.2)
Basos: 0 %
EOS (ABSOLUTE): 0.1 10*3/uL (ref 0.0–0.4)
Eos: 1 %
Hematocrit: 43.4 % (ref 34.0–46.6)
Hemoglobin: 14.9 g/dL (ref 11.1–15.9)
IMMATURE GRANS (ABS): 0 10*3/uL (ref 0.0–0.1)
IMMATURE GRANULOCYTES: 0 %
LYMPHS: 39 %
Lymphocytes Absolute: 2.7 10*3/uL (ref 0.7–3.1)
MCH: 30.2 pg (ref 26.6–33.0)
MCHC: 34.3 g/dL (ref 31.5–35.7)
MCV: 88 fL (ref 79–97)
Monocytes Absolute: 0.5 10*3/uL (ref 0.1–0.9)
Monocytes: 7 %
NEUTROS PCT: 53 %
Neutrophils Absolute: 3.7 10*3/uL (ref 1.4–7.0)
PLATELETS: 353 10*3/uL (ref 150–450)
RBC: 4.93 x10E6/uL (ref 3.77–5.28)
RDW: 13.7 % (ref 11.7–15.4)
WBC: 7 10*3/uL (ref 3.4–10.8)

## 2018-12-23 LAB — COMPREHENSIVE METABOLIC PANEL
A/G RATIO: 1.7 (ref 1.2–2.2)
ALT: 29 IU/L (ref 0–32)
AST: 17 IU/L (ref 0–40)
Albumin: 4.5 g/dL (ref 3.5–5.5)
Alkaline Phosphatase: 99 IU/L (ref 39–117)
BUN/Creatinine Ratio: 12 (ref 9–23)
BUN: 12 mg/dL (ref 6–24)
Bilirubin Total: 0.3 mg/dL (ref 0.0–1.2)
CHLORIDE: 96 mmol/L (ref 96–106)
CO2: 24 mmol/L (ref 20–29)
Calcium: 9.7 mg/dL (ref 8.7–10.2)
Creatinine, Ser: 1 mg/dL (ref 0.57–1.00)
GFR calc Af Amer: 79 mL/min/{1.73_m2} (ref >59)
GFR calc non Af Amer: 68 mL/min/{1.73_m2} (ref >59)
Globulin, Total: 2.6 g/dL (ref 1.5–4.5)
Glucose: 79 mg/dL (ref 65–99)
POTASSIUM: 4 mmol/L (ref 3.5–5.2)
Sodium: 135 mmol/L (ref 134–144)
Total Protein: 7.1 g/dL (ref 6.0–8.5)

## 2018-12-23 LAB — HCV COMMENT:

## 2018-12-23 LAB — HIV ANTIBODY (ROUTINE TESTING W REFLEX): HIV SCREEN 4TH GENERATION: NONREACTIVE

## 2018-12-23 LAB — TSH: TSH: 4.62 u[IU]/mL — AB (ref 0.450–4.500)

## 2018-12-23 LAB — HEPATITIS C ANTIBODY (REFLEX)

## 2018-12-25 ENCOUNTER — Encounter: Payer: Self-pay | Admitting: Gastroenterology

## 2019-01-14 ENCOUNTER — Other Ambulatory Visit: Payer: Self-pay | Admitting: Family Medicine

## 2019-01-14 DIAGNOSIS — I10 Essential (primary) hypertension: Secondary | ICD-10-CM

## 2019-01-14 NOTE — Progress Notes (Signed)
[facility] Consult Note:  History: Mariah Rice 01/15/2019  Referring physician: Kathrene Alu, MD  Reason for consult/chief complaint: Weight Loss (for 3 months. ); Nausea (Zofran once daily. Not too helpful. Avoids taking because causes drowsiness. Works 3rd shift.); Emesis; Gastroesophageal Reflux (manages well with Omep 40 qd); and Constipation (started about one month ago. Denies bleeding)   Subjective  HPI:  This is a very pleasant 48 year old woman referred by primary care clinic for persistent nausea vomiting and weight loss.  About 3 months ago she developed frequent nausea and vomiting that occurs daily.  She will feel nauseated multiple times during the day and typically vomit once a day on average.  Her appetite is decreased and she is lost at least 20 pounds the last few months.  Dniya denies dysphagia or odynophagia.  She has heartburn that is generally well controlled on omeprazole 40 mg daily.  She does not drink or use drugs, and she smokes daily. She has lately had some constipation that she has attributed to decreased food intake.  No rectal bleeding. He also reports that none of her medicines are new around the time of symptom onset. ROS:  Review of Systems  Constitutional: Negative for appetite change and unexpected weight change.  HENT: Negative for mouth sores and voice change.   Eyes: Negative for pain and redness.  Respiratory: Positive for cough. Negative for shortness of breath.   Cardiovascular: Negative for chest pain and palpitations.  Genitourinary: Negative for dysuria and hematuria.  Musculoskeletal: Positive for back pain. Negative for arthralgias and myalgias.  Skin: Negative for pallor and rash.  Allergic/Immunologic: Positive for environmental allergies.  Neurological: Negative for weakness and headaches.  Hematological: Negative for adenopathy.  Psychiatric/Behavioral:       Anxiety and depression.    Difficulty  with sleep habits, works third shift  Past Medical History: Past Medical History:  Diagnosis Date  . Anxiety   . Dental abscess   . Depression   . GERD (gastroesophageal reflux disease)   . Headache   . Hypertension   . Hyperthyroidism 1990s  . PONV (postoperative nausea and vomiting)   . Shortness of breath dyspnea    with anxiety     Past Surgical History: Past Surgical History:  Procedure Laterality Date  . INCISE AND DRAIN ABCESS  2005  . LAPAROSCOPIC ASSISTED VAGINAL HYSTERECTOMY N/A 07/11/2015   Procedure: LAPAROSCOPIC ASSISTED VAGINAL HYSTERECTOMY;  Surgeon: Osborne Oman, MD;  Location: [facility];  Service: Gynecology;  Laterality: N/A;  . LAPAROSCOPIC BILATERAL SALPINGECTOMY Bilateral 07/11/2015   Procedure: LAPAROSCOPIC BILATERAL SALPINGO OOPHORECTOMY ;  Surgeon: Osborne Oman, MD;  Location: [facility];  Service: Gynecology;  Laterality: Bilateral;  . tubal ligation  2000     Family History: Family History  Problem Relation Age of Onset  . Cancer Mother        cervical cancer  . Heart disease Father   . Hypertension Father   . COPD Father   . Hypertension Paternal Aunt   . Thyroid disease Maternal Grandmother   . Hypertension Paternal Grandmother   . Heart disease Paternal Grandmother     Social History: Social History   Socioeconomic History  . Marital status: Single    Spouse name: Not on file  . Number of children: Not on file  . Years of education: Not on file  . Highest education level: Not on file  Occupational History  . Not on file  Social Needs  .  Financial resource strain: Not on file  . Food insecurity:    Worry: Not on file    Inability: Not on file  . Transportation needs:    Medical: Not on file    Non-medical: Not on file  Tobacco Use  . Smoking status: Current Every Day Smoker    Packs/day: 0.50    Types: Cigarettes  . Smokeless tobacco: Never Used  Substance and Sexual Activity  . Alcohol use: Yes    Alcohol/week: 0.0  standard drinks    Comment: occasional  . Drug use: No  . Sexual activity: Not on file  Lifestyle  . Physical activity:    Days per week: Not on file    Minutes per session: Not on file  . Stress: Not on file  Relationships  . Social connections:    Talks on phone: Not on file    Gets together: Not on file    Attends religious service: Not on file    Active member of club or organization: Not on file    Attends meetings of clubs or organizations: Not on file    Relationship status: Not on file  Other Topics Concern  . Not on file  Social History Narrative  . Not on file    Allergies: Allergies  Allergen Reactions  . Meloxicam Nausea And Vomiting  . Sumatriptan     Reported nausea, rash, throat felt like it was closing up  . Penicillins Nausea Only and Rash    Has patient had a PCN reaction causing immediate rash, facial/tongue/throat swelling, SOB or lightheadedness with hypotension: no Has patient had a PCN reaction causing severe rash involving mucus membranes or skin necrosis:unknown Has patient had a PCN reaction that required hospitalization : yes Has patient had a PCN reaction occurring within the last 10 years: no If all of the above answers are "NO", then may proceed with Cephalosporin use.   . Sulfa Antibiotics Nausea Only and Rash    Outpatient Meds: Current Outpatient Medications  Medication Sig Dispense Refill  . busPIRone (BUSPAR) 7.5 MG tablet Take 1 tablet (7.5 mg total) by mouth 3 (three) times daily.    . CVS NICOTINE POLACRILEX 4 MG gum 1 PIECE AS NEEDED PER PACKAGE DIRECTIONS 24 TIME(S) A DAY MOUTH/THROAT  1  . estradiol (ESTRACE) 2 MG tablet Take 1 tablet (2 mg total) by mouth daily. 90 tablet 3  . fluticasone (FLONASE) 50 MCG/ACT nasal spray Place 2 sprays into both nostrils daily. 16 g 11  . lurasidone (LATUDA) 40 MG TABS tablet Take 1 tablet (40 mg total) by mouth daily with breakfast. 30 tablet   . omeprazole (PRILOSEC) 40 MG capsule TAKE ONE  CAPSULE BY MOUTH EVERY DAY    . ondansetron (ZOFRAN ODT) 4 MG disintegrating tablet Take 1 tablet (4 mg total) by mouth every 8 (eight) hours as needed for nausea or vomiting. 20 tablet 0  . oxcarbazepine (TRILEPTAL) 600 MG tablet Take 600 mg by mouth 2 (two) times daily. Takes 1.5 tablets twice daily    . pravastatin (PRAVACHOL) 40 MG tablet Take 1 tablet (40 mg total) by mouth daily. 90 tablet 3  . prazosin (MINIPRESS) 1 MG capsule Take 1 mg by mouth at bedtime.    . propranolol ER (INDERAL LA) 80 MG 24 hr capsule TAKE 1 CAPSULE BY MOUTH EVERY DAY 90 capsule 3  . triamterene-hydrochlorothiazide (MAXZIDE-25) 37.5-25 MG tablet TAKE 1 TABLET BY MOUTH EVERY DAY 30 tablet 1   No current facility-administered medications  for this visit.       ___________________________________________________________________ Objective   Exam:  BP 120/80   Pulse 76   Ht 5\' 4"  (1.626 m)   Wt 201 lb (91.2 kg)   LMP  (LMP Unknown) Comment: on Megace  BMI 34.50 kg/m    General: Fatigued  Eyes: sclera anicteric, no redness  ENT: oral mucosa moist without lesions, no cervical or supraclavicular lymphadenopathy  CV: RRR without murmur, S1/S2, no JVD, no peripheral edema  Resp: clear to auscultation bilaterally, normal RR and effort noted  GI: soft, upper tenderness, with active bowel sounds. No guarding or palpable organomegaly noted.  Skin; warm and dry, no rash or jaundice noted  Neuro: awake, alert and oriented x 3. Normal gross motor function and fluent speech  Labs:  CMP Latest Ref Rng & Units 12/22/2018 08/19/2018 10/17/2017  Glucose 65 - 99 mg/dL 79 89 87  BUN 6 - 24 mg/dL 12 8 11   Creatinine 0.57 - 1.00 mg/dL 1.00 0.78 0.99  Sodium 134 - 144 mmol/L 135 131(L) 140  Potassium 3.5 - 5.2 mmol/L 4.0 3.9 3.7  Chloride 96 - 106 mmol/L 96 92(L) 101  CO2 20 - 29 mmol/L 24 23 24   Calcium 8.7 - 10.2 mg/dL 9.7 8.9 9.4  Total Protein 6.0 - 8.5 g/dL 7.1 6.7 7.1  Total Bilirubin 0.0 - 1.2 mg/dL 0.3  0.3 <0.2  Alkaline Phos 39 - 117 IU/L 99 98 85  AST 0 - 40 IU/L 17 27 15   ALT 0 - 32 IU/L 29 34(H) 15   CBC Latest Ref Rng & Units 12/22/2018 08/19/2018 09/17/2017  WBC 3.4 - 10.8 x10E3/uL 7.0 7.4 6.2  Hemoglobin 11.1 - 15.9 g/dL 14.9 13.3 14.2  Hematocrit 34.0 - 46.6 % 43.4 38.1 42.0  Platelets 150 - 450 x10E3/uL 353 317 325   TSH 4.6 HIV negative  Radiologic Studies: No recent abdominal imaging  Assessment: Encounter Diagnoses  Name Primary?  . Abdominal pain, chronic, epigastric Yes  . Nausea and vomiting in adult   . Abnormal loss of weight   . Heartburn     Nature of symptoms uncertain, but certainly worrisome with persistent vomiting leading to weight loss.  Plan:  Upper endoscopy.  She is agreeable after discussion of procedure and risks.  The benefits and risks of the planned procedure were described in detail with the patient or (when appropriate) their health care proxy.  Risks were outlined as including, but not limited to, bleeding, infection, perforation, adverse medication reaction leading to cardiac or pulmonary decompensation, or pancreatitis (if ERCP).  The limitation of incomplete mucosal visualization was also discussed.  No guarantees or warranties were given.   Thank you for the courtesy of this consult.  Please call me with any questions or concerns.  Nelida Meuse III  CC: Referring provider noted above

## 2019-01-15 ENCOUNTER — Encounter: Payer: Self-pay | Admitting: Gastroenterology

## 2019-01-15 ENCOUNTER — Ambulatory Visit (INDEPENDENT_AMBULATORY_CARE_PROVIDER_SITE_OTHER): Payer: BLUE CROSS/BLUE SHIELD | Admitting: Gastroenterology

## 2019-01-15 VITALS — BP 120/80 | HR 76 | Ht 64.0 in | Wt 201.0 lb

## 2019-01-15 DIAGNOSIS — G8929 Other chronic pain: Secondary | ICD-10-CM

## 2019-01-15 DIAGNOSIS — R634 Abnormal weight loss: Secondary | ICD-10-CM

## 2019-01-15 DIAGNOSIS — R1013 Epigastric pain: Secondary | ICD-10-CM

## 2019-01-15 DIAGNOSIS — R112 Nausea with vomiting, unspecified: Secondary | ICD-10-CM

## 2019-01-15 DIAGNOSIS — R12 Heartburn: Secondary | ICD-10-CM

## 2019-01-15 NOTE — Patient Instructions (Signed)
If you are age 46 or older, your body mass index should be between 23-30. Your Body mass index is 34.5 kg/m. If this is out of the aforementioned range listed, please consider follow up with your Primary Care Provider.  If you are age 10 or younger, your body mass index should be between 19-25. Your Body mass index is 34.5 kg/m. If this is out of the aformentioned range listed, please consider follow up with your Primary Care Provider.   You have been scheduled for an endoscopy. Please follow written instructions given to you at your visit today. If you use inhalers (even only as needed), please bring them with you on the day of your procedure. Your physician has requested that you go to www.startemmi.com and enter the access code given to you at your visit today. This web site gives a general overview about your procedure. However, you should still follow specific instructions given to you by our office regarding your preparation for the procedure.  It was a pleasure to see you today!  Dr. Loletha Carrow

## 2019-01-20 ENCOUNTER — Other Ambulatory Visit: Payer: Self-pay

## 2019-01-20 ENCOUNTER — Encounter: Payer: Self-pay | Admitting: Family Medicine

## 2019-01-20 ENCOUNTER — Ambulatory Visit (INDEPENDENT_AMBULATORY_CARE_PROVIDER_SITE_OTHER): Payer: BLUE CROSS/BLUE SHIELD | Admitting: Family Medicine

## 2019-01-20 VITALS — BP 124/76 | Temp 98.1°F | Ht 64.0 in | Wt 204.8 lb

## 2019-01-20 DIAGNOSIS — K5900 Constipation, unspecified: Secondary | ICD-10-CM | POA: Diagnosis not present

## 2019-01-20 DIAGNOSIS — Z803 Family history of malignant neoplasm of breast: Secondary | ICD-10-CM

## 2019-01-20 DIAGNOSIS — R197 Diarrhea, unspecified: Secondary | ICD-10-CM | POA: Diagnosis not present

## 2019-01-20 DIAGNOSIS — R1110 Vomiting, unspecified: Secondary | ICD-10-CM | POA: Diagnosis not present

## 2019-01-20 DIAGNOSIS — Z8041 Family history of malignant neoplasm of ovary: Secondary | ICD-10-CM | POA: Diagnosis not present

## 2019-01-20 MED ORDER — POLYETHYLENE GLYCOL 3350 17 GM/SCOOP PO POWD: 1.0000 | Freq: Two times a day (BID) | ORAL | 1 refills | Status: DC | PRN

## 2019-01-20 MED ORDER — ONDANSETRON 4 MG PO TBDP: 4.0000 mg | ORAL_TABLET | Freq: Three times a day (TID) | ORAL | 0 refills | Status: DC | PRN

## 2019-01-20 NOTE — Patient Instructions (Signed)
Thank you for coming in to see [place] today! Please see below to review our plan for today's visit:  1. Constipation: I recommend a cap full of Miralax twice daily. Take more or less as needed to have soft stools. If you start to experience diarrhea, stop taking the Miralax daily, and instead take it every other day.  2. Nausea: keep taking Zofran daily as needed for nausea. 3. Follow up with [place] in 4 weeks to review GI reports and thyroid labs and health. 4. Stop taking Estrace/Estradiol as this may be contributing to your nausea. Also, with a strong family history of breast and ovarian cancer it's smart to stop taking it for now. Keep a diary of symptoms you have while off of this medication. 5. I encourage you to stop smoking! If you ever need help for this, please let us know so you don't go through this by yourself! 6. You are being referred to Genetic Oncology for assessment of your family history of breast and ovarian and other cancers.   Please call the clinic at [phone] if your symptoms worsen or you have any concerns. It was our pleasure to serve you!      Dr. Milus Banister [facility]

## 2019-01-20 NOTE — Progress Notes (Addendum)
   Subjective:    Patient ID: Mariah Rice, female    DOB: 12-Sep-1971, 48 y.o.   MRN: [id number]   CC: Follow-up for thyroid check, constipation/decreased appetite/nausea  HPI:   Decreased appetite: The patient states she has been doing better with her appetite since she started taking Zofran.  She is having less nausea but the medications make her sleepy so she can only take them 1 time daily.  She is requesting more Zofran.  She has not vomited since her last visit here with [place] 3 weeks prior.  She reports she has gained 3 to 4 pounds and enjoys eating [place].  Constipation: Patient continues to have unchanged constipation and is only having a BM every 3 to 4 days.  She describes her stools as very hard and painful when she finally poops.  She is drinking coffee to help her have more frequent, looser bowel movements but this is not very helpful.  She has not tried anything that makes her constipation better or worse.  She denies her stools appearing black, tarry, bloody, pencil/ribbon-shaped, and changes to odors or appearance.  She has an upcoming appointment to see GI for an endoscopy.  The patient reports she has gained 3 pounds since her last appointment.  Thyroid check: At the patient's last appointment 3 weeks prior her TSH was noted to be slightly elevated.  The patient has been having nausea, vomiting, and decreased appetite but was having weight loss. She denies having vomited recently but reports continue nausea and decreased appetite, made better by Zofran. She denies feeling tired, cold intolerance, and changes to her hair.  Smoking status reviewed: The patient continues to smoke 1/2 pack cigarettes daily and is currently not interested in quitting at this time.  She has tried chewing gum in the past.  She was informed that there are other methods to help her quit.  Review of Systems  Constitutional: Negative for chills, fever and weight loss.  Respiratory: Negative for shortness  of breath.   Cardiovascular: Negative for chest pain.  Gastrointestinal: Positive for constipation. Negative for abdominal pain, blood in stool, diarrhea, nausea and vomiting.  Genitourinary: Negative for dysuria, flank pain and hematuria.  Neurological: Negative for weakness.   Objective:  BP 124/76   Temp 98.1 F (36.7 C) (Oral)   Ht 5\' 4"  (1.626 m)   Wt 204 lb 12.8 oz (92.9 kg)   LMP  (LMP Unknown) Comment: on Megace  BMI 35.15 kg/m   Physical Exam Assessment & Plan:   Constipation Patient prescribed miralax for constipation, given instructions for titrating meds to have one soft stool daily. Decrease in weight has stopped, patient has gained 3 lbs. Ordered TSH, T3, T4 - T3 and TSH wnl, T4 slightly low. Patient called and notified.  Family history of breast cancer Patient referred to genetic oncology to further investigate family history of breast/ovarian cancer. Patient told to discontinue Estrace temporarily.  Family history of ovarian cancer Patient referred to genetic oncology to further investigate family history of breast/ovarian cancer. Patient told to discontinue Estrace temporarily.   Vomiting and diarrhea Vomiting has ceased with Zofran 4mg  daily. Patient told to d/c Estrace as this may be contributing to her N/V.  Awaiting visit with GI and results of scopes  Return in about 4 weeks (around 02/17/2019) for follow up for Thyroid health and GI reports.  Dr. Milus Banister [facility], PGY-1

## 2019-01-21 LAB — TSH: TSH: 3.39 u[IU]/mL (ref 0.450–4.500)

## 2019-01-21 LAB — T3, FREE: T3, Free: 2.4 pg/mL (ref 2.0–4.4)

## 2019-01-21 LAB — T4, FREE: Free T4: 0.81 ng/dL — ABNORMAL LOW (ref 0.82–1.77)

## 2019-01-26 DIAGNOSIS — K5900 Constipation, unspecified: Secondary | ICD-10-CM | POA: Insufficient documentation

## 2019-01-26 DIAGNOSIS — R1110 Vomiting, unspecified: Secondary | ICD-10-CM | POA: Insufficient documentation

## 2019-01-26 DIAGNOSIS — Z803 Family history of malignant neoplasm of breast: Secondary | ICD-10-CM | POA: Insufficient documentation

## 2019-01-26 DIAGNOSIS — R197 Diarrhea, unspecified: Secondary | ICD-10-CM

## 2019-01-26 HISTORY — DX: Family history of malignant neoplasm of breast: Z80.3

## 2019-01-26 NOTE — Assessment & Plan Note (Addendum)
Patient referred to genetic oncology to further investigate family history of breast/ovarian cancer. Patient told to discontinue Estrace temporarily.

## 2019-01-26 NOTE — Assessment & Plan Note (Addendum)
Patient prescribed miralax for constipation, given instructions for titrating meds to have one soft stool daily. Decrease in weight has stopped, patient has gained 3 lbs. Ordered TSH, T3, T4 - T3 and TSH wnl, T4 slightly low. Patient called and notified.

## 2019-01-26 NOTE — Assessment & Plan Note (Addendum)
Vomiting has ceased with Zofran 4mg  daily. Patient told to d/c Estrace as this may be contributing to her N/V.  Awaiting visit with GI and results of scopes

## 2019-01-28 ENCOUNTER — Encounter: Payer: Self-pay | Admitting: Gastroenterology

## 2019-01-28 ENCOUNTER — Telehealth: Payer: Self-pay | Admitting: Gastroenterology

## 2019-01-28 ENCOUNTER — Ambulatory Visit (AMBULATORY_SURGERY_CENTER): Payer: BLUE CROSS/BLUE SHIELD | Admitting: Gastroenterology

## 2019-01-28 VITALS — BP 139/80 | HR 60 | Temp 98.2°F | Resp 19 | Ht 64.0 in | Wt 201.0 lb

## 2019-01-28 DIAGNOSIS — R112 Nausea with vomiting, unspecified: Secondary | ICD-10-CM

## 2019-01-28 DIAGNOSIS — R634 Abnormal weight loss: Secondary | ICD-10-CM

## 2019-01-28 DIAGNOSIS — G8929 Other chronic pain: Secondary | ICD-10-CM

## 2019-01-28 DIAGNOSIS — K3189 Other diseases of stomach and duodenum: Secondary | ICD-10-CM

## 2019-01-28 DIAGNOSIS — R1013 Epigastric pain: Secondary | ICD-10-CM

## 2019-01-28 DIAGNOSIS — R109 Unspecified abdominal pain: Secondary | ICD-10-CM | POA: Diagnosis not present

## 2019-01-28 MED ORDER — SODIUM CHLORIDE 0.9 % IV SOLN: 500.0000 mL | Freq: Once | INTRAVENOUS | Status: DC

## 2019-01-28 NOTE — Telephone Encounter (Signed)
Please arrange Abdominal/pelvic CT scan with oral and IV contrast.  Dx:  Upper abdominal pain, vomiting, weight loss.   She was sent home from [facility] with oral contrast.

## 2019-01-28 NOTE — Telephone Encounter (Signed)
Pt returning phone call to [facility] regarding CT.

## 2019-01-28 NOTE — Telephone Encounter (Signed)
Left message on machine to call back    You have been scheduled for a CT scan of the abdomen and pelvis at [facility] ([address]---this is in the same building as [facility]).   You are scheduled on 02/15/19 at 1130 am. You should arrive 15 minutes prior to your appointment time for registration. Please follow the written instructions below on the day of your exam:  WARNING: IF YOU ARE ALLERGIC TO IODINE/X-RAY DYE, PLEASE NOTIFY RADIOLOGY IMMEDIATELY AT [phone]! YOU WILL BE GIVEN A 13 HOUR PREMEDICATION PREP.  1) Do not eat or drink anything after 730 am (4 hours prior to your test) 2) You have been given 2 bottles of oral contrast to drink. The solution may taste better if refrigerated, but do NOT add ice or any other liquid to this solution. Shake well before drinking.   Drink 1 bottle of contrast @ 930 am (2 hours prior to your exam) Drink 1 bottle of contrast @ 1030 am (1 hour prior to your exam)  You may take any medications as prescribed with a small amount of water, if necessary. If you take any of the following medications: METFORMIN, GLUCOPHAGE, GLUCOVANCE, AVANDAMET, RIOMET, FORTAMET, [place] MET, JANUMET, GLUMETZA or METAGLIP, you MAY be asked to HOLD this medication 48 hours AFTER the exam.  The purpose of you drinking the oral contrast is to aid in the visualization of your intestinal tract. The contrast solution may cause some diarrhea. Depending on your individual set of symptoms, you may also receive an intravenous injection of x-ray contrast/dye. Plan on being at [facility] for 30 minutes or longer, depending on the type of exam you are having performed.  This test typically takes 30-45 minutes to complete.  If you have any questions regarding your exam or if you need to reschedule, you may call the CT department at [phone] between the hours of 8:00 am and 5:00 pm,  Monday-Friday.  ________________________________________________________________________

## 2019-01-28 NOTE — Op Note (Signed)
[facility] Patient Name: Mariah Rice Procedure Date: 01/28/2019 7:36 AM MRN: [id number] Endoscopist: Mallie Mussel L. Loletha Carrow , MD Age: 48 Referring MD:  Date of Birth: 06/02/1971 Gender: Female Account #: [id number] Procedure:                Upper GI endoscopy Indications:              Epigastric abdominal pain, Nausea with vomiting,                            Weight loss Medicines:                Monitored Anesthesia Care Procedure:                Pre-Anesthesia Assessment:                           - Prior to the procedure, a History and Physical                            was performed, and patient medications and                            allergies were reviewed. The patient's tolerance of                            previous anesthesia was also reviewed. The risks                            and benefits of the procedure and the sedation                            options and risks were discussed with the patient.                            All questions were answered, and informed consent                            was obtained. Prior Anticoagulants: The patient has                            taken no previous anticoagulant or antiplatelet                            agents. ASA Grade Assessment: II - A patient with                            mild systemic disease. After reviewing the risks                            and benefits, the patient was deemed in                            satisfactory condition to undergo the procedure.  After obtaining informed consent, the endoscope was                            passed under direct vision. Throughout the                            procedure, the patient's blood pressure, pulse, and                            oxygen saturations were monitored continuously. The                            Endoscope was introduced through the mouth, and                            advanced to the third part of duodenum. The  upper                            GI endoscopy was accomplished without difficulty.                            The patient tolerated the procedure well. Scope In: Scope Out: Findings:                 The larynx was normal.                           The esophagus was normal.                           Diffuse mildly congested mucosa was found in the                            gastric body. Two biopsies each were obtained in                            the gastric body and in the gastric antrum with                            cold forceps for histology.                           The exam of the stomach was otherwise normal,                            including on retroflexion.                           The examined duodenum was normal. Complications:            No immediate complications. Estimated Blood Loss:     Estimated blood loss was minimal. Impression:               - Normal larynx.                           -  Normal esophagus.                           - Congestive gastropathy.                           - Normal examined duodenum.                           - Two biopsies were obtained in the gastric body                            and in the gastric antrum. Recommendation:           - Patient has a contact number available for                            emergencies. The signs and symptoms of potential                            delayed complications were discussed with the                            patient. Return to normal activities tomorrow.                            Written discharge instructions were provided to the                            patient.                           - Resume previous diet.                           - Continue present medications.                           - Await pathology results.                           - Perform a CT scan (computed tomography) of                            abdomen with contrast and pelvis with contrast at                             appointment to be scheduled. Henry L. Loletha Carrow, MD 01/28/2019 7:52:55 AM This report has been signed electronically.

## 2019-01-28 NOTE — Patient Instructions (Signed)
Contrast provided  YOU HAD AN ENDOSCOPIC PROCEDURE TODAY AT [facility]:   Refer to the procedure report that was given to you for any specific questions about what was found during the examination.  If the procedure report does not answer your questions, please call your gastroenterologist to clarify.  If you requested that your care partner not be given the details of your procedure findings, then the procedure report has been included in a sealed envelope for you to review at your convenience later.  YOU SHOULD EXPECT: Some feelings of bloating in the abdomen. Passage of more gas than usual.  Walking can help get rid of the air that was put into your GI tract during the procedure and reduce the bloating. If you had a lower endoscopy (such as a colonoscopy or flexible sigmoidoscopy) you may notice spotting of blood in your stool or on the toilet paper. If you underwent a bowel prep for your procedure, you may not have a normal bowel movement for a few days.  Please Note:  You might notice some irritation and congestion in your nose or some drainage.  This is from the oxygen used during your procedure.  There is no need for concern and it should clear up in a day or so.  SYMPTOMS TO REPORT IMMEDIATELY:   Following upper endoscopy (EGD)  Vomiting of blood or coffee ground material  New chest pain or pain under the shoulder blades  Painful or persistently difficult swallowing  New shortness of breath  Fever of 100F or higher  Black, tarry-looking stools  For urgent or emergent issues, a gastroenterologist can be reached at any hour by calling [phone].   DIET:  We do recommend a small meal at first, but then you may proceed to your regular diet.  Drink plenty of fluids but you should avoid alcoholic beverages for 24 hours.  ACTIVITY:  You should plan to take it easy for the rest of today and you should NOT DRIVE or use heavy machinery until tomorrow (because of the  sedation medicines used during the test).    FOLLOW UP: Our staff will call the number listed on your records the next business day following your procedure to check on you and address any questions or concerns that you may have regarding the information given to you following your procedure. If we do not reach you, we will leave a message.  However, if you are feeling well and you are not experiencing any problems, there is no need to return our call.  We will assume that you have returned to your regular daily activities without incident.  If any biopsies were taken you will be contacted by phone or by letter within the next 1-3 weeks.  Please call us at [phone] if you have not heard about the biopsies in 3 weeks.    SIGNATURES/CONFIDENTIALITY: You and/or your care partner have signed paperwork which will be entered into your electronic medical record.  These signatures attest to the fact that that the information above on your After Visit Summary has been reviewed and is understood.  Full responsibility of the confidentiality of this discharge information lies with you and/or your care-partner.

## 2019-01-28 NOTE — Progress Notes (Signed)
PT taken to PACU. Monitors in place. VSS. Report given to RN. 

## 2019-01-29 ENCOUNTER — Other Ambulatory Visit: Payer: Self-pay

## 2019-01-29 ENCOUNTER — Telehealth: Payer: Self-pay | Admitting: *Deleted

## 2019-01-29 NOTE — Telephone Encounter (Signed)
Left message on machine to call back  

## 2019-01-29 NOTE — Telephone Encounter (Signed)
No answer, second call.  Left message to call if questions or concerns. 

## 2019-01-29 NOTE — Progress Notes (Signed)
Per upper gi endo procedure report-patient is to be scheduled for a CT abd/pel with contrast-patient has been scheduled on 02/15/2019 11:30 AM at  [facility]; it appears the patient received instructions and contrast prior to discharge from [facility];

## 2019-01-29 NOTE — Telephone Encounter (Signed)
No answer, left message to call if questions or concerns. 

## 2019-02-01 NOTE — Telephone Encounter (Signed)
I have been unable to reach the pt by phone and have sent the instructions via My Chart

## 2019-02-02 DIAGNOSIS — Z008 Encounter for other general examination: Secondary | ICD-10-CM | POA: Diagnosis not present

## 2019-02-02 DIAGNOSIS — F17210 Nicotine dependence, cigarettes, uncomplicated: Secondary | ICD-10-CM | POA: Diagnosis not present

## 2019-02-02 DIAGNOSIS — E669 Obesity, unspecified: Secondary | ICD-10-CM | POA: Diagnosis not present

## 2019-02-02 DIAGNOSIS — Z719 Counseling, unspecified: Secondary | ICD-10-CM | POA: Diagnosis not present

## 2019-02-02 DIAGNOSIS — Z716 Tobacco abuse counseling: Secondary | ICD-10-CM | POA: Diagnosis not present

## 2019-02-04 ENCOUNTER — Encounter: Payer: Self-pay | Admitting: Genetic Counselor

## 2019-02-04 ENCOUNTER — Telehealth: Payer: Self-pay | Admitting: Genetic Counselor

## 2019-02-04 NOTE — Telephone Encounter (Signed)
A genetic counseling appt has been scheduled for the pt to see Roma Kayser on 3/11 at 10am. Letter mailed to the pt.

## 2019-02-09 ENCOUNTER — Encounter: Payer: Self-pay | Admitting: Gastroenterology

## 2019-02-12 ENCOUNTER — Encounter (HOSPITAL_COMMUNITY): Payer: Self-pay

## 2019-02-12 ENCOUNTER — Ambulatory Visit (HOSPITAL_COMMUNITY)
Admission: EM | Admit: 2019-02-12 | Discharge: 2019-02-12 | Disposition: A | Discharge status: Home / Self Care | Payer: BLUE CROSS/BLUE SHIELD | Attending: Family Medicine | Admitting: Family Medicine

## 2019-02-12 ENCOUNTER — Telehealth: Payer: Self-pay | Admitting: Gastroenterology

## 2019-02-12 DIAGNOSIS — R69 Illness, unspecified: Secondary | ICD-10-CM | POA: Diagnosis not present

## 2019-02-12 DIAGNOSIS — J111 Influenza due to unidentified influenza virus with other respiratory manifestations: Secondary | ICD-10-CM

## 2019-02-12 MED ORDER — HYDROCODONE-HOMATROPINE 5-1.5 MG/5ML PO SYRP: 5.0000 mL | ORAL_SOLUTION | Freq: Four times a day (QID) | ORAL | 0 refills | Status: DC | PRN

## 2019-02-12 NOTE — Telephone Encounter (Signed)
Pt given the phone number [phone] to reschedule her CT scan with [place] [facility].

## 2019-02-12 NOTE — ED Triage Notes (Signed)
Pt presents with general body aches, sore throat, headache, and productive cough with minimal clear mucus since yesterday.

## 2019-02-12 NOTE — Discharge Instructions (Addendum)
This is most likely a viral illness Hycodan cough syrup for the cough and to reset  You can take ibuprofen and tylenol for the body aches and fever.  Work note given.

## 2019-02-12 NOTE — Telephone Encounter (Signed)
Pt called in wanting to cancel and reschd the scan on 02/15/2019 she stated that she has the flu.

## 2019-02-12 NOTE — ED Provider Notes (Signed)
[place]    CSN: [id number] Arrival date & time: 02/12/19  0857     History   Chief Complaint Chief Complaint  Patient presents with  . URI    HPI Mariah Rice is a 48 y.o. female.   Pt is a 48 year old female that presents with flu like symptoms to include; fever, chills, myalgias, cough, congestion and rhinorrhea. This has been present and worsened over the past 2 days.  She has not taken anything for her symptoms.Marland Kitchen Positive sick contact with boyfriend having flulike symptoms. No recent traveling. Decrease in appetite but drinking fluids. No N,V,D.   ROS per HPI       Past Medical History:  Diagnosis Date  . Anxiety   . Dental abscess   . Depression   . GERD (gastroesophageal reflux disease)   . Headache   . Hypertension   . Hyperthyroidism 1990s  . PONV (postoperative nausea and vomiting)   . Shortness of breath dyspnea    with anxiety    Patient Active Problem List   Diagnosis Date Noted  . Family history of breast cancer 01/26/2019  . Constipation 01/26/2019  . Vomiting and diarrhea 01/26/2019  . Unintentional weight loss 12/22/2018  . Intermittent vomiting 12/22/2018  . Edema 08/19/2018  . Low back pain potentially associated with radiculopathy 06/06/2018  . Prediabetes 05/31/2018  . Tinea pedis of both feet 05/31/2018  . Muscle strain of left gluteal region 03/20/2018  . Migraine 03/20/2018  . Left hip pain 02/11/2018  . Migraine without aura and with status migrainosus, not intractable 11/30/2017  . Diarrhea of presumed infectious origin 09/19/2017  . Skin tag 08/05/2017  . Tick bite 05/13/2017  . Numbness and tingling in both hands 01/29/2017  . Rosacea 10/14/2016  . Mood disorder ([place]) 10/06/2016  . Tingling sensation 08/28/2016  . Rash and nonspecific skin eruption 12/27/2015  . Pain in finger of left hand 12/27/2015  . GERD (gastroesophageal reflux disease) 10/19/2015  . H/O bilateral oophorectomy 08/13/2015  . S/P  laparoscopic assisted vaginal hysterectomy (LAVH) 07/11/2015  . Family history of ovarian cancer 05/04/2015  . Back pain 03/15/2015  . Dermoid cyst of arm 12/19/2014  . Environmental allergies 09/30/2013  . Depression 01/06/2013  . GAD (generalized anxiety disorder) 01/06/2013  . Headache 11/24/2012  . Tobacco abuse 11/10/2012  . Overweight 11/10/2012  . Hypertension 11/10/2012    Past Surgical History:  Procedure Laterality Date  . INCISE AND DRAIN ABCESS  2005  . LAPAROSCOPIC ASSISTED VAGINAL HYSTERECTOMY N/A 07/11/2015   Procedure: LAPAROSCOPIC ASSISTED VAGINAL HYSTERECTOMY;  Surgeon: Osborne Oman, MD;  Location: [facility];  Service: Gynecology;  Laterality: N/A;  . LAPAROSCOPIC BILATERAL SALPINGECTOMY Bilateral 07/11/2015   Procedure: LAPAROSCOPIC BILATERAL SALPINGO OOPHORECTOMY ;  Surgeon: Osborne Oman, MD;  Location: [facility];  Service: Gynecology;  Laterality: Bilateral;  . tubal ligation  2000  . WISDOM TOOTH EXTRACTION      OB History    Gravida  4   Para  2   Term  2   Preterm      AB  2   Living  2     SAB  2   TAB      Ectopic      Multiple      Live Births               Home Medications    Prior to Admission medications   Medication Sig Start Date End Date Taking? Authorizing Provider  busPIRone (BUSPAR) 7.5 MG tablet Take 1 tablet (7.5 mg total) by mouth 3 (three) times daily. 06/08/18   Rogue Bussing, MD  [facility] NICOTINE POLACRILEX 4 MG gum 1 PIECE AS NEEDED PER PACKAGE DIRECTIONS 24 TIME(S) A DAY MOUTH/THROAT 03/15/18   [provider]  fluticasone (FLONASE) 50 MCG/ACT nasal spray Place 2 sprays into both nostrils daily. 05/19/17   Rogue Bussing, MD  HYDROcodone-homatropine [facility] 5-1.5 MG/5ML syrup Take 5 mLs by mouth every 6 (six) hours as needed for cough. 02/12/19   Loura Halt A, NP  lurasidone (LATUDA) 40 MG TABS tablet Take 1 tablet (40 mg total) by mouth daily with breakfast. 06/08/18   Rogue Bussing, MD  omeprazole (PRILOSEC) 40 MG capsule TAKE ONE CAPSULE BY MOUTH EVERY DAY 07/18/16   [provider]  ondansetron (ZOFRAN ODT) 4 MG disintegrating tablet Take 1 tablet (4 mg total) by mouth every 8 (eight) hours as needed for nausea or vomiting. 01/20/19   Daisy Floro, DO  oxcarbazepine (TRILEPTAL) 600 MG tablet Take 600 mg by mouth 2 (two) times daily. Takes 1.5 tablets twice daily    [provider]  polyethylene glycol powder (GLYCOLAX/MIRALAX) powder Take 255 g by mouth 2 (two) times daily as needed. 01/20/19   Daisy Floro, DO  pravastatin (PRAVACHOL) 40 MG tablet Take 1 tablet (40 mg total) by mouth daily. 06/08/18   Rogue Bussing, MD  prazosin (MINIPRESS) 1 MG capsule Take 1 mg by mouth at bedtime.    [provider]  propranolol ER (INDERAL LA) 80 MG 24 hr capsule TAKE 1 CAPSULE BY MOUTH EVERY DAY 09/25/18   Kathrene Alu, MD  triamterene-hydrochlorothiazide (GNFAOZH-08) 37.5-25 MG tablet TAKE 1 TABLET BY MOUTH EVERY DAY 01/15/19   Kathrene Alu, MD    Family History Family History  Problem Relation Age of Onset  . Cancer Mother        cervical cancer  . Heart disease Father   . Hypertension Father   . COPD Father   . Hypertension Paternal Aunt   . Thyroid disease Maternal Grandmother   . Hypertension Paternal Grandmother   . Heart disease Paternal Grandmother   . Colon cancer Neg Hx   . Esophageal cancer Neg Hx   . Stomach cancer Neg Hx   . Pancreatic cancer Neg Hx     Social History Social History   Tobacco Use  . Smoking status: Current Every Day Smoker    Packs/day: 0.50    Types: Cigarettes  . Smokeless tobacco: Never Used  Substance Use Topics  . Alcohol use: Yes    Alcohol/week: 0.0 standard drinks    Comment: occasional  . Drug use: No     Allergies   Meloxicam; Sumatriptan; Penicillins; and Sulfa antibiotics   Review of Systems Review of Systems   Physical Exam Triage Vital Signs ED  Triage Vitals  Enc Vitals Group     BP 02/12/19 0924 (!) 138/91     Pulse Rate 02/12/19 0924 82     Resp 02/12/19 0924 16     Temp 02/12/19 0924 99.1 F (37.3 C)     Temp Source 02/12/19 0924 Oral     SpO2 02/12/19 0924 97 %     Weight --      Height --      Head Circumference --      Peak Flow --      Pain Score 02/12/19 0953 4  Pain Loc --      Pain Edu? --      Excl. in [place]? --    No data found.  Updated Vital Signs BP (!) 138/91 (BP Location: Left Arm) Comment: Patient has not taken BP medication today  Pulse 82   Temp 99.1 F (37.3 C) (Oral)   Resp 16   LMP  (LMP Unknown) Comment: on Megace  SpO2 97%   Visual Acuity Right Eye Distance:   Left Eye Distance:   Bilateral Distance:    Right Eye Near:   Left Eye Near:    Bilateral Near:     Physical Exam Vitals signs and nursing note reviewed.  Constitutional:      General: She is not in acute distress.    Appearance: Normal appearance. She is normal weight. She is ill-appearing. She is not toxic-appearing or diaphoretic.     Comments: Ill appearing   HENT:     Head: Normocephalic and atraumatic.     Right Ear: Tympanic membrane and ear canal normal.     Left Ear: Tympanic membrane and ear canal normal.     Nose: Congestion and rhinorrhea present.     Mouth/Throat:     Pharynx: Posterior oropharyngeal erythema present.  Eyes:     Conjunctiva/sclera: Conjunctivae normal.  Neck:     Musculoskeletal: Normal range of motion and neck supple.  Cardiovascular:     Rate and Rhythm: Normal rate and regular rhythm.     Pulses: Normal pulses.     Heart sounds: Normal heart sounds.  Pulmonary:     Effort: Pulmonary effort is normal.     Breath sounds: Normal breath sounds.  Musculoskeletal: Normal range of motion.  Skin:    General: Skin is warm and dry.  Neurological:     Mental Status: She is alert.  Psychiatric:        Mood and Affect: Mood normal.      UC Treatments / Results  Labs (all labs  ordered are listed, but only abnormal results are displayed) Labs Reviewed - No data to display  EKG None  Radiology No results found.  Procedures Procedures (including critical care time)  Medications Ordered in UC Medications - No data to display  Initial Impression / Assessment and Plan / UC Course  I have reviewed the triage vital signs and the nursing notes.  Pertinent labs & imaging results that were available during my care of the patient were reviewed by me and considered in my medical decision making (see chart for details).     Symptoms consistent with flu like illness Symptomatic treatment with OTC cough and congestion medication such as mucinex Hycodan cough syrup for worsening cough Tylenol/ibuprofen for the myalgias and fever.  Follow up as needed for continued or worsening symptoms  Final Clinical Impressions(s) / UC Diagnoses   Final diagnoses:  Influenza-like illness     Discharge Instructions     This is most likely a viral illness Hycodan cough syrup for the cough and to reset  You can take ibuprofen and tylenol for the body aches and fever.  Work note given.     ED Prescriptions    Medication Sig Dispense Auth. Provider   HYDROcodone-homatropine (HYCODAN) 5-1.5 MG/5ML syrup Take 5 mLs by mouth every 6 (six) hours as needed for cough. 120 mL Loura Halt A, NP     Controlled Substance Prescriptions [place] Controlled Substance Registry consulted? Not Applicable   Orvan July, NP 02/12/19 1028

## 2019-02-15 ENCOUNTER — Encounter: Payer: Self-pay | Admitting: Family Medicine

## 2019-02-15 ENCOUNTER — Inpatient Hospital Stay: Admission: RE | Admit: 2019-02-15 | Payer: BLUE CROSS/BLUE SHIELD | Source: Ambulatory Visit

## 2019-02-15 ENCOUNTER — Other Ambulatory Visit: Payer: Self-pay

## 2019-02-15 ENCOUNTER — Ambulatory Visit (INDEPENDENT_AMBULATORY_CARE_PROVIDER_SITE_OTHER): Payer: BLUE CROSS/BLUE SHIELD | Admitting: Family Medicine

## 2019-02-15 VITALS — BP 144/78 | HR 63 | Temp 98.8°F | Ht 64.0 in | Wt 204.8 lb

## 2019-02-15 DIAGNOSIS — J449 Chronic obstructive pulmonary disease, unspecified: Secondary | ICD-10-CM | POA: Diagnosis not present

## 2019-02-15 DIAGNOSIS — R6889 Other general symptoms and signs: Secondary | ICD-10-CM | POA: Diagnosis not present

## 2019-02-15 DIAGNOSIS — Z720 Tobacco use: Secondary | ICD-10-CM | POA: Diagnosis not present

## 2019-02-15 DIAGNOSIS — J988 Other specified respiratory disorders: Secondary | ICD-10-CM

## 2019-02-15 DIAGNOSIS — B9789 Other viral agents as the cause of diseases classified elsewhere: Secondary | ICD-10-CM

## 2019-02-15 LAB — POCT INFLUENZA A/B
Influenza A, POC: NEGATIVE
Influenza B, POC: NEGATIVE

## 2019-02-15 MED ORDER — ALBUTEROL SULFATE HFA 108 (90 BASE) MCG/ACT IN AERS: 2.0000 | INHALATION_SPRAY | Freq: Four times a day (QID) | RESPIRATORY_TRACT | 0 refills | Status: DC | PRN

## 2019-02-15 NOTE — Progress Notes (Signed)
Established Patient Office Visit  Subjective:  Patient ID: Mariah Rice, female    DOB: 02-02-1971  Age: 48 y.o. MRN: [id number]  CC:  Chief Complaint  Patient presents with  . Chest cold    HPI Mariah Rice presents for chest cold.  Patient became ill three days ago.  Sx include cough, chest discomfort, body aches and laryngitis.  Initially had fever, now resoloved.  Smoker, but has been unable to smoke last three days due to cough and SOB.  No formal dx of COPD.  States at baseline has DOE.  Has wheezed with resp. Infections previously and sleeps on 4 pillows.  Did get flu shot this year.  Past Medical History:  Diagnosis Date  . Anxiety   . Dental abscess   . Depression   . GERD (gastroesophageal reflux disease)   . Headache   . Hypertension   . Hyperthyroidism 1990s  . PONV (postoperative nausea and vomiting)   . Shortness of breath dyspnea    with anxiety    Past Surgical History:  Procedure Laterality Date  . INCISE AND DRAIN ABCESS  2005  . LAPAROSCOPIC ASSISTED VAGINAL HYSTERECTOMY N/A 07/11/2015   Procedure: LAPAROSCOPIC ASSISTED VAGINAL HYSTERECTOMY;  Surgeon: Osborne Oman, MD;  Location: [facility];  Service: Gynecology;  Laterality: N/A;  . LAPAROSCOPIC BILATERAL SALPINGECTOMY Bilateral 07/11/2015   Procedure: LAPAROSCOPIC BILATERAL SALPINGO OOPHORECTOMY ;  Surgeon: Osborne Oman, MD;  Location: [facility];  Service: Gynecology;  Laterality: Bilateral;  . tubal ligation  2000  . WISDOM TOOTH EXTRACTION      Family History  Problem Relation Age of Onset  . Cancer Mother        cervical cancer  . Heart disease Father   . Hypertension Father   . COPD Father   . Hypertension Paternal Aunt   . Thyroid disease Maternal Grandmother   . Hypertension Paternal Grandmother   . Heart disease Paternal Grandmother   . Colon cancer Neg Hx   . Esophageal cancer Neg Hx   . Stomach cancer Neg Hx   . Pancreatic cancer Neg Hx     Social History   Socioeconomic  History  . Marital status: Single    Spouse name: Not on file  . Number of children: Not on file  . Years of education: Not on file  . Highest education level: Not on file  Occupational History  . Not on file  Social Needs  . Financial resource strain: Not on file  . Food insecurity:    Worry: Not on file    Inability: Not on file  . Transportation needs:    Medical: Not on file    Non-medical: Not on file  Tobacco Use  . Smoking status: Current Every Day Smoker    Packs/day: 0.50    Types: Cigarettes  . Smokeless tobacco: Never Used  Substance and Sexual Activity  . Alcohol use: Yes    Alcohol/week: 0.0 standard drinks    Comment: occasional  . Drug use: No  . Sexual activity: Not on file  Lifestyle  . Physical activity:    Days per week: Not on file    Minutes per session: Not on file  . Stress: Not on file  Relationships  . Social connections:    Talks on phone: Not on file    Gets together: Not on file    Attends religious service: Not on file    Active member of club or organization: Not on  file    Attends meetings of clubs or organizations: Not on file    Relationship status: Not on file  . Intimate partner violence:    Fear of current or ex partner: Not on file    Emotionally abused: Not on file    Physically abused: Not on file    Forced sexual activity: Not on file  Other Topics Concern  . Not on file  Social History Narrative  . Not on file    Outpatient Medications Prior to Visit  Medication Sig Dispense Refill  . busPIRone (BUSPAR) 7.5 MG tablet Take 1 tablet (7.5 mg total) by mouth 3 (three) times daily.    . [facility] NICOTINE POLACRILEX 4 MG gum 1 PIECE AS NEEDED PER PACKAGE DIRECTIONS 24 TIME(S) A DAY MOUTH/THROAT  1  . fluticasone (FLONASE) 50 MCG/ACT nasal spray Place 2 sprays into both nostrils daily. 16 g 11  . HYDROcodone-homatropine (HYCODAN) 5-1.5 MG/5ML syrup Take 5 mLs by mouth every 6 (six) hours as needed for cough. 120 mL 0  . lurasidone  (LATUDA) 40 MG TABS tablet Take 1 tablet (40 mg total) by mouth daily with breakfast. 30 tablet   . omeprazole (PRILOSEC) 40 MG capsule TAKE ONE CAPSULE BY MOUTH EVERY DAY    . ondansetron (ZOFRAN ODT) 4 MG disintegrating tablet Take 1 tablet (4 mg total) by mouth every 8 (eight) hours as needed for nausea or vomiting. 20 tablet 0  . oxcarbazepine (TRILEPTAL) 600 MG tablet Take 600 mg by mouth 2 (two) times daily. Takes 1.5 tablets twice daily    . polyethylene glycol powder (GLYCOLAX/MIRALAX) powder Take 255 g by mouth 2 (two) times daily as needed. 3350 g 1  . pravastatin (PRAVACHOL) 40 MG tablet Take 1 tablet (40 mg total) by mouth daily. 90 tablet 3  . prazosin (MINIPRESS) 1 MG capsule Take 1 mg by mouth at bedtime.    . propranolol ER (INDERAL LA) 80 MG 24 hr capsule TAKE 1 CAPSULE BY MOUTH EVERY DAY 90 capsule 3  . triamterene-hydrochlorothiazide (MAXZIDE-25) 37.5-25 MG tablet TAKE 1 TABLET BY MOUTH EVERY DAY 30 tablet 1   No facility-administered medications prior to visit.     Allergies  Allergen Reactions  . Meloxicam Nausea And Vomiting  . Sumatriptan     Reported nausea, rash, throat felt like it was closing up  . Penicillins Nausea Only and Rash    Has patient had a PCN reaction causing immediate rash, facial/tongue/throat swelling, SOB or lightheadedness with hypotension: no Has patient had a PCN reaction causing severe rash involving mucus membranes or skin necrosis:unknown Has patient had a PCN reaction that required hospitalization : yes Has patient had a PCN reaction occurring within the last 10 years: no If all of the above answers are "NO", then may proceed with Cephalosporin use.   . Sulfa Antibiotics Nausea Only and Rash    ROS Review of Systems    Objective:    Physical Exam  BP (!) 144/78   Pulse 63   Temp 98.8 F (37.1 C) (Oral)   Ht 5\' 4"  (1.626 m)   Wt 204 lb 12.8 oz (92.9 kg)   LMP  (LMP Unknown) Comment: on Megace  SpO2 92%   BMI 35.15 kg/m    Wt Readings from Last 3 Encounters:  02/15/19 204 lb 12.8 oz (92.9 kg)  01/28/19 201 lb (91.2 kg)  01/20/19 204 lb 12.8 oz (92.9 kg)   No increased work of breathing at rest.  Speaks in  full sentences.   Throat, mild injection Neck, no significant nodes. Lungs end exp wheeze bilaterally Cardiac RRR without m or g  Rapid flu test neg  There are no preventive care reminders to display for this patient.  There are no preventive care reminders to display for this patient.  Lab Results  Component Value Date   TSH 3.390 01/20/2019   Lab Results  Component Value Date   WBC 7.0 12/22/2018   HGB 14.9 12/22/2018   HCT 43.4 12/22/2018   MCV 88 12/22/2018   PLT 353 12/22/2018   Lab Results  Component Value Date   NA 135 12/22/2018   K 4.0 12/22/2018   CO2 24 12/22/2018   GLUCOSE 79 12/22/2018   BUN 12 12/22/2018   CREATININE 1.00 12/22/2018   BILITOT 0.3 12/22/2018   ALKPHOS 99 12/22/2018   AST 17 12/22/2018   ALT 29 12/22/2018   PROT 7.1 12/22/2018   ALBUMIN 4.5 12/22/2018   CALCIUM 9.7 12/22/2018   ANIONGAP 6 06/29/2015   Lab Results  Component Value Date   CHOL 230 (H) 09/17/2017   Lab Results  Component Value Date   HDL 42 09/17/2017   Lab Results  Component Value Date   LDLCALC 142 (H) 09/17/2017   Lab Results  Component Value Date   TRIG 230 (H) 09/17/2017   Lab Results  Component Value Date   CHOLHDL 5.5 (H) 09/17/2017   Lab Results  Component Value Date   HGBA1C 5.5 08/19/2018      Assessment & Plan:   Problem List Items Addressed This Visit    Viral respiratory illness   Relevant Medications   albuterol (PROVENTIL HFA;VENTOLIN HFA) 108 (90 Base) MCG/ACT inhaler    Other Visit Diagnoses    Flu-like symptoms    -  Primary   Relevant Orders   Influenza A/B (Completed)      Meds ordered this encounter  Medications  . albuterol (PROVENTIL HFA;VENTOLIN HFA) 108 (90 Base) MCG/ACT inhaler    Sig: Inhale 2 puffs into the lungs every 6  (six) hours as needed for wheezing or shortness of breath.    Dispense:  1 Inhaler    Refill:  0    Follow-up: No follow-ups on file.    Zenia Resides, MD

## 2019-02-15 NOTE — Assessment & Plan Note (Signed)
Highly suspect underlying COPD.  Likely needs PFTs when over current illness.  Likely needs LAMA

## 2019-02-15 NOTE — Assessment & Plan Note (Signed)
Rx with albuterol per patient request despite minimal wheeze.  She has used and responded well in the past.  Expectant observation.

## 2019-02-15 NOTE — Patient Instructions (Signed)
Quit smoking  I sent in an inhaler. I will give you a work note. Next visit with Dr. Shan Levans, ask her to be tested for COPD.

## 2019-02-15 NOTE — Assessment & Plan Note (Signed)
She is 3 days quit smoking.  Strongly encouraged to not restart as she clinically improves.

## 2019-02-23 ENCOUNTER — Other Ambulatory Visit: Payer: Self-pay

## 2019-02-23 ENCOUNTER — Encounter: Payer: Self-pay | Admitting: Family Medicine

## 2019-02-23 ENCOUNTER — Ambulatory Visit (INDEPENDENT_AMBULATORY_CARE_PROVIDER_SITE_OTHER): Payer: BLUE CROSS/BLUE SHIELD | Admitting: Family Medicine

## 2019-02-23 ENCOUNTER — Ambulatory Visit (HOSPITAL_COMMUNITY)
Admission: RE | Admit: 2019-02-23 | Discharge: 2019-02-23 | Disposition: A | Discharge status: Home / Self Care | Payer: BLUE CROSS/BLUE SHIELD | Source: Ambulatory Visit | Attending: Family Medicine | Admitting: Family Medicine

## 2019-02-23 DIAGNOSIS — R0602 Shortness of breath: Secondary | ICD-10-CM | POA: Insufficient documentation

## 2019-02-23 DIAGNOSIS — Z716 Tobacco abuse counseling: Secondary | ICD-10-CM | POA: Diagnosis not present

## 2019-02-23 DIAGNOSIS — F17210 Nicotine dependence, cigarettes, uncomplicated: Secondary | ICD-10-CM | POA: Diagnosis not present

## 2019-02-23 MED ORDER — PREDNISONE 20 MG PO TABS: 20.0000 mg | ORAL_TABLET | Freq: Every day | ORAL | 0 refills | Status: DC

## 2019-02-23 NOTE — Assessment & Plan Note (Addendum)
In setting of presumed COPD.  No hypoxia on exam today.  Will get CXR as part of work up.  Also treat with steroids given persistent symptoms.  Use lower dose given her mental health diagnoses.  No signs of PE or pneumonia or ACS

## 2019-02-23 NOTE — Patient Instructions (Addendum)
Good to see you today!  Thanks for coming in.  I think you have a exacerbation of probable COPD Take the prednisone one tab every morning for 5 days You should slowly be better in the next 1-2 weeks  If not or worsening especially with high fever then call us I will let you know about the cxr  Bring all your medication bottles to see Dr Shan Levans   Keep working on the smoking

## 2019-02-23 NOTE — Progress Notes (Signed)
Subjective  Mariah Rice is a 48 y.o. female is presenting with the following   [place]  Has been sick for more than a week.  Seen on 3/2 Dr Andria Frames strarted in albuterol which has not helped much.  Still with cough and mild shortness of breath when walking. Seen by company nurse today and had a low pulse ox (she is not sure of the readings) and sent for evaluation.  No shortness of breath at rest or fever or leg swelling  Also uses her friends nebulizer which she feels does help  Still smoking about 6 cigs per day   Chief Complaint noted Review of Symptoms - see HPI PMH - Smoking status noted.    Objective Vital Signs reviewed BP 120/74   Pulse 96   Temp 98 F (36.7 C) (Oral)   Ht 5\' 4"  (1.626 m)   Wt 189 lb 12.8 oz (86.1 kg)   LMP  (LMP Unknown) Comment: on Megace  SpO2 91%   BMI 32.58 kg/m  Heart - Regular rate and rhythm.  No murmurs, gallops or rubs.    Lungs:  Normal respiratory effort, chest expands symmetrically. Lungs are clear to auscultation, no crackles has mild wheeze with forced expiration Throat: normal mucosa, no exudate, uvula midline, no redness Neck:  No deformities, thyromegaly, masses, or tenderness noted.   Supple with full range of motion without pain. Skin:  Intact without suspicious lesions or rashes Extremities:  No cyanosis, edema, or deformity noted with good range of motion of all major joints.    Ambulating pulse ox 91-93% without symptoms    Assessments/Plans  See after visit summary for details of patient instuctions  SOB (shortness of breath) In setting of presumed COPD.  No hypoxia on exam today.  Will get CXR as part of work up.  Also treat with steroids given persistent symptoms.  Use lower dose given her mental health diagnoses.  No signs of PE or pneumonia or ACS

## 2019-02-24 ENCOUNTER — Encounter: Payer: Self-pay | Admitting: Family Medicine

## 2019-02-24 ENCOUNTER — Ambulatory Visit: Payer: BLUE CROSS/BLUE SHIELD | Admitting: Family Medicine

## 2019-02-24 ENCOUNTER — Encounter: Payer: Self-pay | Admitting: Genetic Counselor

## 2019-02-24 ENCOUNTER — Inpatient Hospital Stay: Payer: BLUE CROSS/BLUE SHIELD

## 2019-02-24 ENCOUNTER — Other Ambulatory Visit: Payer: Self-pay

## 2019-02-24 ENCOUNTER — Inpatient Hospital Stay: Payer: BLUE CROSS/BLUE SHIELD | Attending: Genetic Counselor | Admitting: Genetic Counselor

## 2019-02-24 DIAGNOSIS — Z803 Family history of malignant neoplasm of breast: Secondary | ICD-10-CM

## 2019-02-24 DIAGNOSIS — Z8041 Family history of malignant neoplasm of ovary: Secondary | ICD-10-CM

## 2019-02-24 NOTE — Progress Notes (Signed)
REFERRING PROVIDER: Kathrene Alu, MD [address], [place] [address]  PRIMARY PROVIDER:  Kathrene Alu, MD  PRIMARY REASON FOR VISIT:  1. Family history of ovarian cancer   2. Family history of breast cancer      HISTORY OF PRESENT ILLNESS:   Mariah Rice, a 48 y.o. female, was seen for a [place] cancer genetics consultation at the request of Dr. Shan Levans due to a family history of breast and ovarian cancer.  Mariah Rice presents to clinic today to discuss the possibility of a hereditary predisposition to cancer, genetic testing, and to further clarify her future cancer risks, as well as potential cancer risks for family members.   Mariah Rice is a 48 y.o. female with no personal history of cancer.  She has never had cancer, but did have a complete hysterectomy at age 76.  She has been on HRT for about 3 years, and is not sure how long she will be on it.  CANCER HISTORY:   No history exists.     RISK FACTORS:  Menarche was at age 48.  First live birth at age 89.  OCP use for approximately 2 years.  Ovaries intact: no.  Hysterectomy: yes.  Menopausal status: postmenopausal.  HRT use: 2 years. Colonoscopy: no; not examined. Mammogram within the last year: yes. Number of breast biopsies: 1. Up to date with pelvic exams: no. Any excessive radiation exposure in the past: no  Past Medical History:  Diagnosis Date   Anxiety    Dental abscess    Depression    Family history of breast cancer    Family history of ovarian cancer    GERD (gastroesophageal reflux disease)    Headache    Hypertension    Hyperthyroidism 1990s   PONV (postoperative nausea and vomiting)    Shortness of breath dyspnea    with anxiety    Past Surgical History:  Procedure Laterality Date   INCISE AND DRAIN ABCESS  2005   LAPAROSCOPIC ASSISTED VAGINAL HYSTERECTOMY N/A 07/11/2015   Procedure: LAPAROSCOPIC ASSISTED VAGINAL HYSTERECTOMY;  Surgeon: Osborne Oman, MD;   Location: [facility];  Service: Gynecology;  Laterality: N/A;   LAPAROSCOPIC BILATERAL SALPINGECTOMY Bilateral 07/11/2015   Procedure: LAPAROSCOPIC BILATERAL SALPINGO OOPHORECTOMY ;  Surgeon: Osborne Oman, MD;  Location: [facility];  Service: Gynecology;  Laterality: Bilateral;   tubal ligation  2000   WISDOM TOOTH EXTRACTION      Social History   Socioeconomic History   Marital status: Single    Spouse name: Not on file   Number of children: Not on file   Years of education: Not on file   Highest education level: Not on file  Occupational History   Not on file  Social Needs   Financial resource strain: Not on file   Food insecurity:    Worry: Not on file    Inability: Not on file   Transportation needs:    Medical: Not on file    Non-medical: Not on file  Tobacco Use   Smoking status: Current Every Day Smoker    Packs/day: 0.50    Types: Cigarettes   Smokeless tobacco: Never Used  Substance and Sexual Activity   Alcohol use: Yes    Alcohol/week: 0.0 standard drinks    Comment: occasional   Drug use: No   Sexual activity: Not on file  Lifestyle   Physical activity:    Days per week: Not on file    Minutes per  session: Not on file   Stress: Not on file  Relationships   Social connections:    Talks on phone: Not on file    Gets together: Not on file    Attends religious service: Not on file    Active member of club or organization: Not on file    Attends meetings of clubs or organizations: Not on file    Relationship status: Not on file  Other Topics Concern   Not on file  Social History Narrative   Not on file     FAMILY HISTORY:  We obtained a detailed, 4-generation family history.  Significant diagnoses are listed below: Family History  Problem Relation Age of Onset   Ovarian cancer Mother 14       d. 67   Heart disease Father    Hypertension Father    COPD Father    Hypertension Paternal Aunt 37   Lung cancer Paternal Aunt 74     Thyroid disease Maternal Grandmother    Lymphoma Maternal Grandmother 22       NHL, recurrance at 37   Lung cancer Maternal Grandmother 99   Hypertension Paternal Grandmother    Heart disease Paternal Grandmother    Heart Problems Paternal Grandmother    Dementia Paternal Grandmother    Colon cancer Neg Hx    Esophageal cancer Neg Hx    Stomach cancer Neg Hx    Pancreatic cancer Neg Hx     The patient has a son and daughter who are cancer free.  She is the only child to her parents and both parents are deceased.  The patient's mother was diagnosed with ovarian cancer at age 64 and died at 40.  She has a brother and sister who are cancer free.  The maternal grandfather died of heart disease and the grandmother is living.  She has a history of non-hodgkin's lymphoma at age 20 and again at 32.  She was also diagnosed with lung cancer at 47.  The patient's father died of heart problems. He had one sister who has breast cancer at 41, and a recent diagnosis at 8.  The paternal grandparents are deceased from heart disease.  Mariah Rice is unaware of previous family history of genetic testing for hereditary cancer risks. Patient's maternal ancestors are of Caucasian descent, and paternal ancestors are of Caucasian descent. There is no reported Ashkenazi Jewish ancestry. There is no known consanguinity.  GENETIC COUNSELING ASSESSMENT: Mariah Rice is a 48 y.o. female with a family history of breast and ovarian cancer which is somewhat suggestive of a hereditary cancer syndrome and predisposition to cancer. We, therefore, discussed and recommended the following at today's visit.   DISCUSSION: We discussed that 15 - 20% of ovarian cancer is hereditary, with most cases associated with BRCA mutations.  There are other genes that can be associated with hereditary ovarian cancer syndromes.  These include BRIP1, Lynch syndrome and RAD51C and RAD51D.      We reviewed the characteristics,  features and inheritance patterns of hereditary cancer syndromes. We also discussed genetic testing, including the appropriate family members to test, the process of testing, insurance coverage and turn-around-time for results. We discussed the implications of a negative, positive and/or variant of uncertain significant result. We recommended Mariah Rice pursue genetic testing for the CancerNext-expanded+RNAinsight gene panel. The CancerNext-Expanded gene panel offered by [facility] and includes sequencing and rearrangement analysis for the following 67 genes: AIP, ALK, APC*, ATM*, BAP1, BARD1, BLM, BMPR1A, BRCA1*, BRCA2*,  BRIP1*, CDH1*, CDK4, CDKN1B, CDKN2A, CHEK2*, DICER1, FANCC, FH, FLCN, GALNT12, HOXB13, MAX, MEN1, MET, MLH1*, MRE11A, MSH2*, MSH6*, MUTYH*, NBN, NF1*, NF2, PALB2*, PHOX2B, PMS2*, POLD1, POLE, POT1, PRKAR1A, PTCH1, PTEN*, RAD50, RAD51C*, RAD51D*, RB1, RET, SDHA, SDHAF2, SDHB, SDHC, SDHD, SMAD4, SMARCA4, SMARCB1, SMARCE1, STK11, SUFU, TMEM127, TP53*, TSC1, TSC2, VHL and XRCC2 (sequencing and deletion/duplication); MITF (sequencing only); EPCAM and GREM1 (deletion/duplication only). DNA and RNA analyses performed for * genes.   Based on Mariah Rice's family history of cancer, she meets medical criteria for genetic testing. Despite that she meets criteria, she may still have an out of pocket cost. We discussed that if her out of pocket cost for testing is over $100, the laboratory will call and confirm whether she wants to proceed with testing.  If the out of pocket cost of testing is less than $100 she will be billed by the genetic testing laboratory.   Based on the patient's family history, a statistical model (Tyrer Cusik) was used to estimate her risk of developing breast cancer. This estimates her lifetime risk of developing breast cancer to be approximately 8%. This estimation does not consider any genetic testing results.  The patient's lifetime breast cancer risk is a preliminary  estimate based on available information using one of several models endorsed by the [place] (ACS). The ACS recommends consideration of breast MRI screening as an adjunct to mammography for patients at high risk (defined as 20% or greater lifetime risk).     PLAN: After considering the risks, benefits, and limitations, Mariah Rice provided informed consent to pursue genetic testing and the blood sample was sent to [facility] for analysis of the CancerNext-expanded-RNAinsight. Results should be available within approximately 2-3 weeks' time, at which point they will be disclosed by telephone to Mariah Rice, as will any additional recommendations warranted by these results. Mariah Rice will receive a summary of her genetic counseling visit and a copy of her results once available. This information will also be available in Epic.   Lastly, we encouraged Mariah Rice to remain in contact with cancer genetics annually so that we can continuously update the family history and inform her of any changes in cancer genetics and testing that may be of benefit for this family.   Mariah Rice questions were answered to her satisfaction today. Our contact information was provided should additional questions or concerns arise. Thank you for the referral and allowing [place] to share in the care of your patient.   Mariah Rice P. Florene Glen, [place], [facility] Certified Genetic Counselor [email or web address] phone: [phone]  The patient was seen for a total of 45 minutes in face-to-face genetic counseling.  This patient was discussed with Drs. Magrinat, Lindi Adie and/or Burr Medico who agrees with the above.    _______________________________________________________________________ For Office Staff:  Number of people involved in session: 2 Was an Intern/ student involved with case: yes: [employer]

## 2019-02-25 ENCOUNTER — Encounter: Payer: Self-pay | Admitting: Family Medicine

## 2019-03-02 ENCOUNTER — Other Ambulatory Visit: Payer: Self-pay | Admitting: Family Medicine

## 2019-03-02 ENCOUNTER — Encounter: Payer: Self-pay | Admitting: Family Medicine

## 2019-03-02 ENCOUNTER — Telehealth: Payer: Self-pay | Admitting: *Deleted

## 2019-03-02 ENCOUNTER — Other Ambulatory Visit: Payer: Self-pay

## 2019-03-02 ENCOUNTER — Ambulatory Visit (INDEPENDENT_AMBULATORY_CARE_PROVIDER_SITE_OTHER): Payer: BLUE CROSS/BLUE SHIELD | Admitting: Family Medicine

## 2019-03-02 VITALS — BP 130/78 | HR 64 | Temp 97.6°F | Wt 195.0 lb

## 2019-03-02 DIAGNOSIS — J449 Chronic obstructive pulmonary disease, unspecified: Secondary | ICD-10-CM | POA: Diagnosis not present

## 2019-03-02 DIAGNOSIS — R1110 Vomiting, unspecified: Secondary | ICD-10-CM | POA: Diagnosis not present

## 2019-03-02 DIAGNOSIS — Z720 Tobacco use: Secondary | ICD-10-CM | POA: Diagnosis not present

## 2019-03-02 DIAGNOSIS — B9789 Other viral agents as the cause of diseases classified elsewhere: Secondary | ICD-10-CM

## 2019-03-02 DIAGNOSIS — J988 Other specified respiratory disorders: Secondary | ICD-10-CM

## 2019-03-02 DIAGNOSIS — R197 Diarrhea, unspecified: Secondary | ICD-10-CM

## 2019-03-02 MED ORDER — FLUTICASONE-SALMETEROL 100-50 MCG/DOSE IN AEPB: 1.0000 | INHALATION_SPRAY | Freq: Two times a day (BID) | RESPIRATORY_TRACT | 3 refills | Status: DC

## 2019-03-02 MED ORDER — ONDANSETRON 4 MG PO TBDP: 4.0000 mg | ORAL_TABLET | Freq: Three times a day (TID) | ORAL | 0 refills | Status: DC | PRN

## 2019-03-02 MED ORDER — CARIPRAZINE HCL 3 MG PO CAPS: 3.0000 mg | ORAL_CAPSULE | Freq: Every day | ORAL | Status: DC

## 2019-03-02 MED ORDER — PANTOPRAZOLE SODIUM 40 MG PO TBEC: 40.0000 mg | DELAYED_RELEASE_TABLET | Freq: Every day | ORAL | 3 refills | Status: DC

## 2019-03-02 MED ORDER — ALBUTEROL SULFATE HFA 108 (90 BASE) MCG/ACT IN AERS: 2.0000 | INHALATION_SPRAY | Freq: Four times a day (QID) | RESPIRATORY_TRACT | 0 refills | Status: DC | PRN

## 2019-03-02 MED ORDER — TIOTROPIUM BROMIDE MONOHYDRATE 18 MCG IN CAPS: 18.0000 ug | ORAL_CAPSULE | Freq: Every day | RESPIRATORY_TRACT | 3 refills | Status: DC

## 2019-03-02 NOTE — Telephone Encounter (Signed)
It appears that Spiriva is tier 3 on her insurance plan.  Will send Rx for Advair since this is tier 2 and may be more affordable for her.  There are no tier 1 medications for COPD on her insurance plan unfortunately.

## 2019-03-02 NOTE — Patient Instructions (Signed)
It was nice seeing you today Ms. Sanks!  Today, I have sent in a new medication called Spiriva, which you will inhale once per day.  Please asked the pharmacist to show you how to use this medication and call us if it is too expensive with your insurance.  Please continue to work on cutting down on your cigarette use, since this is 1 of the most important things to help your breathing.  Please make an appointment with Dr. Valentina Lucks to get pulmonary function testing done at the earliest time that you can.  I have also refilled your Zofran and started pantoprazole, which will hopefully help your acid reflux.  You should stop taking omeprazole.  I have also refilled your albuterol inhaler.  It would be good to see you in the next month or 2 to see how you are breathing is doing.  If you have any questions or concerns, please feel free to call the clinic.   Be well,  Dr. Shan Levans

## 2019-03-02 NOTE — Telephone Encounter (Signed)
Left detailed message on pt phone and informed her of below, instructed her to call back if she has any questions.Chrislynn Zimmerman Rumple, CMA

## 2019-03-02 NOTE — Assessment & Plan Note (Signed)
Filled patient Zofran and started pantoprazole.

## 2019-03-02 NOTE — Progress Notes (Signed)
Subjective:    Mariah Rice - 48 y.o. female MRN [id number]  Date of birth: 06/30/1971  CC:  Bemnet M Googe is here for follow-up of recent viral infection.  She would also like to discuss smoking cessation and needs medication refills.  HPI: Recent viral infection -Says that she improved significantly after receiving prednisone -Was also told that she might have COPD -An x-ray was done, which was reassuring that she did not have pneumonia -Says that she continues to have cough and some shortness of breath, but her symptoms have vastly improved from a week or 2 ago -Does sleep with her head propped up by pillows -Is interested in getting pulmonary function testing to further assess the possibility that she has COPD  Smoking cessation -Says that she smokes about 6 cigarettes/day, which is less than she has smoked in the past -Says that her boyfriend, aunt, and other family members smoke -Says that quitting smoking will be very difficult, but she does have a nurse that she sees 3 times a year that helps give her resources for smoking -Has tried nicotine patches before, but these have not stayed on her skin very well -Has tried nicotine gum, which she thinks is pretty helpful -Has been interested in Chantix, but has been told by her psychiatrist and Dr. Erin Hearing that due to her other medications and her mental illness, this would probably not be a safe choice for her -Understands that her lung disease is connected to her smoking and would improve if she quit  Medication refills -Says that she would like a refill of her Zofran and albuterol -Would also like a medication to help with her acid reflux  Health Maintenance:  There are no preventive care reminders to display for this patient.  -  reports that she has been smoking cigarettes. She has been smoking about 0.50 packs per day. She has never used smokeless tobacco. - Review of Systems: Per HPI. - Past Medical History: Patient  Active Problem List   Diagnosis Date Noted  . SOB (shortness of breath) 02/23/2019  . Viral respiratory illness 02/15/2019  . COPD suggested by initial evaluation ([place]) 02/15/2019  . Family history of breast cancer 01/26/2019  . Constipation 01/26/2019  . Vomiting and diarrhea 01/26/2019  . Unintentional weight loss 12/22/2018  . Intermittent vomiting 12/22/2018  . Edema 08/19/2018  . Low back pain potentially associated with radiculopathy 06/06/2018  . Prediabetes 05/31/2018  . Tinea pedis of both feet 05/31/2018  . Muscle strain of left gluteal region 03/20/2018  . Migraine 03/20/2018  . Left hip pain 02/11/2018  . Migraine without aura and with status migrainosus, not intractable 11/30/2017  . Diarrhea of presumed infectious origin 09/19/2017  . Skin tag 08/05/2017  . Tick bite 05/13/2017  . Numbness and tingling in both hands 01/29/2017  . Rosacea 10/14/2016  . Mood disorder ([place]) 10/06/2016  . Tingling sensation 08/28/2016  . Rash and nonspecific skin eruption 12/27/2015  . Pain in finger of left hand 12/27/2015  . GERD (gastroesophageal reflux disease) 10/19/2015  . H/O bilateral oophorectomy 08/13/2015  . S/P laparoscopic assisted vaginal hysterectomy (LAVH) 07/11/2015  . Family history of ovarian cancer 05/04/2015  . Back pain 03/15/2015  . Dermoid cyst of arm 12/19/2014  . Environmental allergies 09/30/2013  . Depression 01/06/2013  . GAD (generalized anxiety disorder) 01/06/2013  . Headache 11/24/2012  . Tobacco abuse 11/10/2012  . Overweight 11/10/2012  . Hypertension 11/10/2012   - Medications: reviewed and updated  Objective:   Physical Exam BP 130/78   Pulse 64   Temp 97.6 F (36.4 C) (Oral)   Wt 195 lb (88.5 kg)   LMP  (LMP Unknown) Comment: on Megace  SpO2 96%   BMI 33.47 kg/m  Gen: NAD, alert, cooperative with exam, well-appearing CV: RRR, good S1/S2, no murmur, no edema Resp: CTABL, no wheezes, non-labored, prolonged expiration noted Skin:  no rashes, normal turgor  Psych: good insight, alert and oriented     Assessment & Plan:   COPD suggested by initial evaluation [facility]) Patient's chest CT and chest x-ray are indicative of COPD.  This corresponds with her symptoms of prolonged expiration in clinic as well as her significant smoking history.  Her improvement with prednisone and her recent viral illness also supports a diagnosis of COPD.  We will start Spiriva today and schedule her for PFTs.  Patient was counseled to ask the pharmacist to show her how to use Spiriva since this is a new medication for her.  Tobacco abuse There are many barriers for patient to have success with smoking cessation as described in the subjective.  However, patient understands that quitting tobacco will improve her breathing and quality of life.  Encouraged her to call the 1 800 QUIT NOW line to try Nicorette gum since this was been somewhat helpful for her in the past.  I agree that Chantix is not a good therapy for her at this time given her comorbid mental illness and high medication burden.  Intermittent vomiting Filled patient Zofran and started pantoprazole.    Maia Breslow, M.D. 03/02/2019, 1:25 PM PGY-46, [place]

## 2019-03-02 NOTE — Assessment & Plan Note (Signed)
Patient's chest CT and chest x-ray are indicative of COPD.  This corresponds with her symptoms of prolonged expiration in clinic as well as her significant smoking history.  Her improvement with prednisone and her recent viral illness also supports a diagnosis of COPD.  We will start Spiriva today and schedule her for PFTs.  Patient was counseled to ask the pharmacist to show her how to use Spiriva since this is a new medication for her.

## 2019-03-02 NOTE — Assessment & Plan Note (Signed)
There are many barriers for patient to have success with smoking cessation as described in the subjective.  However, patient understands that quitting tobacco will improve her breathing and quality of life.  Encouraged her to call the 1 800 QUIT NOW line to try Nicorette gum since this was been somewhat helpful for her in the past.  I agree that Chantix is not a good therapy for her at this time given her comorbid mental illness and high medication burden.

## 2019-03-02 NOTE — Telephone Encounter (Signed)
Spiriva is going to cost $400.  Pt needs something else called in . Christen Bame, CMA

## 2019-03-03 ENCOUNTER — Telehealth: Payer: Self-pay

## 2019-03-03 NOTE — Telephone Encounter (Signed)
CT is requesting to reschedule the patients upcoming CT on 03/10/19 until after 03/26/19.  Please review for urgency.

## 2019-03-04 ENCOUNTER — Ambulatory Visit: Payer: BLUE CROSS/BLUE SHIELD | Admitting: Pharmacist

## 2019-03-04 ENCOUNTER — Telehealth: Payer: Self-pay | Admitting: Pharmacist

## 2019-03-04 NOTE — Telephone Encounter (Signed)
This patient CT scan can wait until Mariah Rice 12, to be performed soon as possible after that date. She has chronic lung disease, and it would be best for her not to be exposed to any unnecessary infectious risks at present.

## 2019-03-04 NOTE — Telephone Encounter (Signed)
Patient scheduled for PFT today   Contacted patient and determined her lung function has been compromised by recent lung infection and she has NOT yet returned to normal breathing.  She reports improvement in her symptoms over the last week.    We discussed having the lung function tests delayed until she is fully recovered in ~ 4-6 weeks.  She was comfortable with that plan and will call to reschedule.

## 2019-03-08 ENCOUNTER — Telehealth: Payer: Self-pay | Admitting: Genetic Counselor

## 2019-03-08 ENCOUNTER — Ambulatory Visit: Payer: Self-pay | Admitting: Genetic Counselor

## 2019-03-08 DIAGNOSIS — Z1379 Encounter for other screening for genetic and chromosomal anomalies: Secondary | ICD-10-CM

## 2019-03-08 HISTORY — DX: Encounter for other screening for genetic and chromosomal anomalies: Z13.79

## 2019-03-08 NOTE — Telephone Encounter (Signed)
Revealed negative genetic testing.  Discussed that we do not know why there is cancer in the family. It could be due to a different gene that we are not testing, or maybe our current technology may not be able to pick something up.  It will be important for her to keep in contact with genetics to keep up with whether additional testing may be needed.  

## 2019-03-08 NOTE — Progress Notes (Signed)
HPI:  Mariah Rice was previously seen in the [place] clinic due to a family history of cancer and concerns regarding a hereditary predisposition to cancer. Please refer to our prior cancer genetics clinic note for more information regarding our discussion, assessment and recommendations, at the time. Mariah Rice recent genetic test results were disclosed to her, as were recommendations warranted by these results. These results and recommendations are discussed in more detail below.  CANCER HISTORY:   No history exists.    FAMILY HISTORY:  We obtained a detailed, 4-generation family history.  Significant diagnoses are listed below: Family History  Problem Relation Age of Onset  . Ovarian cancer Mother 56       d. 27  . Heart disease Father   . Hypertension Father   . COPD Father   . Hypertension Paternal Aunt 47  . Lung cancer Paternal Aunt 74  . Breast cancer Paternal Aunt 6  . Thyroid disease Maternal Grandmother   . Lymphoma Maternal Grandmother 61       NHL, recurrance at 71  . Lung cancer Maternal Grandmother 28  . Hypertension Paternal Grandmother   . Heart disease Paternal Grandmother   . Heart Problems Paternal Grandmother   . Dementia Paternal Grandmother   . Colon cancer Neg Hx   . Esophageal cancer Neg Hx   . Stomach cancer Neg Hx   . Pancreatic cancer Neg Hx    The patient has a son and daughter who are cancer free.  She is the only child to her parents and both parents are deceased.  The patient's mother was diagnosed with ovarian cancer at age 36 and died at 80.  She has a brother and sister who are cancer free.  The maternal grandfather died of heart disease and the grandmother is living.  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  She was also diagnosed with lung cancer at 87.  The patient's father died of heart problems. He had one sister who has breast cancer at 61, and a recent diagnosis at 29.  The paternal  grandparents are deceased from heart disease.  Mariah Rice is unaware of previous family history of genetic testing for hereditary cancer risks. Patient's maternal ancestors are of Caucasian descent, and paternal ancestors are of Caucasian descent. There is no reported Ashkenazi Jewish ancestry. There is no known consanguinity.    GENETIC TEST RESULTS: Genetic testing reported out on March 05, 2019 through the CancerNext-Expanded+RNAinsight cancer panel found no pathogenic mutations. The CancerNext-Expanded gene panel offered by [facility] and includes sequencing and rearrangement analysis for the following 67 genes: AIP, ALK, APC*, ATM*, BAP1, BARD1, BLM, BMPR1A, BRCA1*, BRCA2*, BRIP1*, CDH1*, CDK4, CDKN1B, CDKN2A, CHEK2*, DICER1, FANCC, FH, FLCN, GALNT12, HOXB13, MAX, MEN1, MET, MLH1*, MRE11A, MSH2*, MSH6*, MUTYH*, NBN, NF1*, NF2, PALB2*, PHOX2B, PMS2*, POLD1, POLE, POT1, PRKAR1A, PTCH1, PTEN*, RAD50, RAD51C*, RAD51D*, RB1, RET, SDHA, SDHAF2, SDHB, SDHC, SDHD, SMAD4, SMARCA4, SMARCB1, SMARCE1, STK11, SUFU, TMEM127, TP53*, TSC1, TSC2, VHL and XRCC2 (sequencing and deletion/duplication); MITF (sequencing only); EPCAM and GREM1 (deletion/duplication only). DNA and RNA analyses performed for * genes.  The test report has been scanned into EPIC and is located under the Molecular Pathology section of the Results Review tab.  A portion of the result report is included below for reference.     We discussed with Mariah Rice that because current genetic testing is not perfect, it is possible there may be a gene mutation  in one of these genes that current testing cannot detect, but that chance is small.  We also discussed, that there could be another gene that has not yet been discovered, or that we have not yet tested, that is responsible for the cancer diagnoses in the family. It is also possible there is a hereditary cause for the cancer in the family that Mariah Rice did not inherit and therefore was not  identified in her testing.  Therefore, it is important to remain in touch with cancer genetics in the future so that we can continue to offer Mariah Rice the most up to date genetic testing.   ADDITIONAL GENETIC TESTING: We discussed with Mariah Rice that her genetic testing was fairly extensive.  If there are genes identified to increase cancer risk that can be analyzed in the future, we would be happy to discuss and coordinate this testing at that time.    CANCER SCREENING RECOMMENDATIONS: Mariah Rice test result is considered negative (normal).  This means that we have not identified a hereditary cause for her family history of cancer at this time. Most cancers happen by chance and this negative test suggests that her cancer may fall into this category.    While reassuring, this does not definitively rule out a hereditary predisposition to cancer. It is still possible that there could be genetic mutations that are undetectable by current technology. There could be genetic mutations in genes that have not been tested or identified to increase cancer risk.  Therefore, it is recommended she continue to follow the cancer management and screening guidelines provided by her primary healthcare provider.   An individual's cancer risk and medical management are not determined by genetic test results alone. Overall cancer risk assessment incorporates additional factors, including personal medical history, family history, and any available genetic information that may result in a personalized plan for cancer prevention and surveillance  RECOMMENDATIONS FOR FAMILY MEMBERS:  Individuals in this family might be at some increased risk of developing cancer, over the general population risk, simply due to the family history of cancer.  We recommended women in this family have a yearly mammogram beginning at age 80, or 75 years younger than the earliest onset of cancer, an annual clinical breast exam, and perform monthly  breast self-exams. Women in this family should also have a gynecological exam as recommended by their primary provider. All family members should have a colonoscopy by age 26.  FOLLOW-UP: Lastly, we discussed with Mariah Rice that cancer genetics is a rapidly advancing field and it is possible that new genetic tests will be appropriate for her and/or her family members in the future. We encouraged her to remain in contact with cancer genetics on an annual basis so we can update her personal and family histories and let her know of advances in cancer genetics that may benefit this family.   Our contact number was provided. Mariah Rice questions were answered to her satisfaction, and she knows she is welcome to call us at anytime with additional questions or concerns.   Roma Kayser, MS, [facility] Certified Genetic Counselor [email or web address]

## 2019-03-09 ENCOUNTER — Telehealth: Payer: Self-pay | Admitting: Family Medicine

## 2019-03-09 ENCOUNTER — Other Ambulatory Visit: Payer: Self-pay | Admitting: Family Medicine

## 2019-03-09 DIAGNOSIS — J988 Other specified respiratory disorders: Principal | ICD-10-CM

## 2019-03-09 DIAGNOSIS — B9789 Other viral agents as the cause of diseases classified elsewhere: Secondary | ICD-10-CM

## 2019-03-09 MED ORDER — ALBUTEROL SULFATE HFA 108 (90 BASE) MCG/ACT IN AERS: 2.0000 | INHALATION_SPRAY | Freq: Four times a day (QID) | RESPIRATORY_TRACT | 3 refills | Status: DC | PRN

## 2019-03-09 NOTE — Telephone Encounter (Signed)
Pt is calling and would like to know what she needs to do to have her FMLA paper work filled out by Dr. Shan Levans concerning her COPD. I explained we are doing mostly office visits over the phone.  Pt would like for Dr. Shan Levans to call her to discuss what needs to be done to have paperwork completed.

## 2019-03-10 ENCOUNTER — Inpatient Hospital Stay: Admission: RE | Admit: 2019-03-10 | Payer: BLUE CROSS/BLUE SHIELD | Source: Ambulatory Visit

## 2019-03-10 NOTE — Telephone Encounter (Signed)
FMLA form dropped off for work at front desk for completion.  Verified that patient section of form has been completed.  Last DOS/WCC with PCP was 03/02/19.  Placed form in team folder to be completed by clinical staff.  Mariah Rice

## 2019-03-10 NOTE — Telephone Encounter (Signed)
Clinical info completed on FMLA form.  Place form in Dr. Winfrey's box for completion.  Twala Zimmerman Rumple, CMA  

## 2019-03-11 NOTE — Telephone Encounter (Signed)
FMLA form completed and placed in RN folder.

## 2019-03-15 ENCOUNTER — Telehealth: Payer: Self-pay | Admitting: Family Medicine

## 2019-03-15 NOTE — Telephone Encounter (Signed)
Pt calling again requesting to speak to [place]. I offered to make her a virtual visit with another doctor for tomorrow but she only wanted to speak to [facility]. Please call pt back to discuss this.

## 2019-03-15 NOTE — Telephone Encounter (Signed)
Patient called stating that she received doctors note for work, and is now having breathing issues and was wondering if she should be seen.

## 2019-03-16 NOTE — Telephone Encounter (Signed)
Spoke with Mariah Rice about her concerns regarding her job.  Patient says that she will need to apply for short-term disability after 7 days of being out of work.  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  She says that she will need a note for me if she stays out of work for 2 weeks.  We discussed that it is probably safest and easiest to discuss this over the phone, and she can drop off any paperwork that I need to fill out at the front desk and I will call her if I have any questions for how to fill this out.  We discussed that I would like for her to be able to work, but I feel that she should not work in an environment that could exacerbate her COPD.  She expressed understanding.

## 2019-03-17 ENCOUNTER — Encounter: Payer: Self-pay | Admitting: Family Medicine

## 2019-03-22 ENCOUNTER — Encounter: Payer: Self-pay | Admitting: Family Medicine

## 2019-03-25 ENCOUNTER — Encounter: Payer: Self-pay | Admitting: Family Medicine

## 2019-03-29 ENCOUNTER — Telehealth: Payer: Self-pay | Admitting: Family Medicine

## 2019-03-29 ENCOUNTER — Other Ambulatory Visit: Payer: Self-pay

## 2019-03-29 DIAGNOSIS — I10 Essential (primary) hypertension: Secondary | ICD-10-CM

## 2019-03-29 MED ORDER — TRIAMTERENE-HCTZ 37.5-25 MG PO TABS: 1.0000 | ORAL_TABLET | Freq: Every day | ORAL | 1 refills | Status: DC

## 2019-03-29 NOTE — Telephone Encounter (Signed)
Spoke with Ms. Principato regarding her diagnosis of COPD and the changes we are making to her work environment.  She says that her employer is looking to find a role for her at work where she will not be surrounded by dust and fumes that could exacerbate her breathing, but she is unsure if they will find a job for her.  Currently, they are closed due to coronavirus.  She asked if her COPD will be a condition she will always have and if she will need to come in for regular treatments for this.  I told her that unfortunately, COPD is something that cannot be cured, but quitting smoking will help keep it from progressing.  We are also using Advair to help prevent exacerbations.  She will not necessarily need to come into the clinic for treatments, but I would like to have her get PFTs done and she will need to be seen at regular intervals to ensure that her COPD is still well controlled.  We decided that she will wait until the [place] pandemic resolves before trying to make an appointment to get her PFTs done or to see me unless she has an acute concern.  She understands that she can always call us if she has any questions or concerns in the meantime.  I have sent in another FMLA form for the patient today as well, and patient is aware of this.

## 2019-04-04 ENCOUNTER — Encounter: Payer: Self-pay | Admitting: Family Medicine

## 2019-04-05 ENCOUNTER — Other Ambulatory Visit: Payer: Self-pay

## 2019-04-05 ENCOUNTER — Other Ambulatory Visit: Payer: Self-pay | Admitting: Family Medicine

## 2019-04-05 ENCOUNTER — Telehealth (INDEPENDENT_AMBULATORY_CARE_PROVIDER_SITE_OTHER): Payer: BLUE CROSS/BLUE SHIELD | Admitting: Family Medicine

## 2019-04-05 DIAGNOSIS — I10 Essential (primary) hypertension: Secondary | ICD-10-CM | POA: Diagnosis not present

## 2019-04-05 MED ORDER — AMLODIPINE BESYLATE 5 MG PO TABS: 5.0000 mg | ORAL_TABLET | Freq: Every day | ORAL | 0 refills | Status: DC

## 2019-04-05 NOTE — Progress Notes (Signed)
[facility] Telemedicine Visit  Attempted contact at [phone] at [phone] 04/05/19. Left mssg I would call back in 30 minutes.    Patient consented to have visit conducted via telephone.  [facility] Telemedicine Visit    Encounter participants: Patient: Mariah Rice  Patient Location: Home Provider: Sherren Mocha [place]  Others (if applicable): none  Chief Complaint: High Blood Pressure measurements  HPI:  CHRONIC HYPERTENSION  Disease Monitoring  "I am having problems with my blood pressure running high and I have been writing it down for the past 4 days and it's been something like 137 over 101 or 134 over 96.. I have a question can we change my blood pressure meds to like lisinipril htz.. The reason I asked is a friend of mine which is a Marine scientist has been telling me how to take care of my bp and said that was a good med to take for it." patient message via [place] on 04/04/19.  Photo copy of 7 home BP measurements sent via MyChart.    Checking Blood pressure at home because started feeling strange: yes, 7 readings over last 3 days with mean 145/96.  No increase alcohol.  No OTC cold remedies, no new medications other recent start of Advair for COPD.  Using albuterol inhaler 2 - 3 times a day.   Chest pain: yes, vague brief feeling in chest that has not recurred   Dyspnea: no   Claudication: no   Medication compliance: yes, Dyazide 37.5/25 daily, Propranolol ER 80 mg daily  Medication Side Effects  Lightheadedness: no   Urinary frequency: no   Edema: no   Palpitations: no   Medications List reviewed and updated.  Review of prior recent 2020 BP measurements in [facility] range 120 - 144/ 74 - 90  ROS:  No fever No cough No new SHOB  Pertinent PMHx:  Essential Hypertension  Exam:  Respiratory: speaking in full sentence, no audible wheeze, voice prosodic   Assessment/Plan:  No problem-specific Assessment & Plan notes found for  this encounter.    Time spent on phone with patient: 11 minutes

## 2019-04-05 NOTE — Assessment & Plan Note (Signed)
Established problem Uncontrolled In stage 1 to 2 hypertension by 2017 ACC/AHA guidelines, goal < 120/80 New home BP measurements consistent with uncontrolled SBP and DBP  Uncertain origin of rise.  Patient with dx of mood disorders, though pt denied being more stressed than usual except for pandemic.  Plan Trial Rx Amlodipine 5 mg daily.  Warned pt about ankle edema. Continue Dyazide 37.5/25 mg daily and migraine prophylactic propranolol ER 80 mg daily. Requested thatin one week,  Mariah Rice send her home BP measurements via MyChart  For [place] assess response of BP to addition of Amlodipine 5 mg daily.  Future options: Increase Amlodipine to 10 mg daily or Increase Propranolol ER to 160 mg daily if heart rate can tolerate

## 2019-04-07 ENCOUNTER — Encounter: Payer: Self-pay | Admitting: Family Medicine

## 2019-04-09 ENCOUNTER — Encounter: Payer: Self-pay | Admitting: Family Medicine

## 2019-04-10 ENCOUNTER — Other Ambulatory Visit: Payer: Self-pay | Admitting: Family Medicine

## 2019-04-10 MED ORDER — UMECLIDINIUM-VILANTEROL 62.5-25 MCG/INH IN AEPB: 1.0000 | INHALATION_SPRAY | Freq: Every day | RESPIRATORY_TRACT | 2 refills | Status: DC

## 2019-04-11 ENCOUNTER — Encounter: Payer: Self-pay | Admitting: Family Medicine

## 2019-04-14 ENCOUNTER — Encounter: Payer: Self-pay | Admitting: Family Medicine

## 2019-04-19 ENCOUNTER — Telehealth (INDEPENDENT_AMBULATORY_CARE_PROVIDER_SITE_OTHER): Payer: BLUE CROSS/BLUE SHIELD | Admitting: Family Medicine

## 2019-04-19 ENCOUNTER — Encounter: Payer: Self-pay | Admitting: Family Medicine

## 2019-04-19 ENCOUNTER — Other Ambulatory Visit: Payer: Self-pay

## 2019-04-19 DIAGNOSIS — J449 Chronic obstructive pulmonary disease, unspecified: Secondary | ICD-10-CM | POA: Diagnosis not present

## 2019-04-19 NOTE — Assessment & Plan Note (Signed)
Patient appears to be on a suitable regimen of Anoro daily with albuterol PRN.  Unfortunately, a position away from dust and fumes at her workplace has not yet opened, but thankfully she will be able to work there once they do find an appropriate position.  We will continue Anoro daily and albuterol as needed and schedule her for pulmonary function testing after she sees me on 5/18.  Although I have filled out 3 forms previously, I will fill out her new disability paperwork explaining the need for workplace restrictions due to her COPD.

## 2019-04-19 NOTE — Progress Notes (Signed)
Spoke to pt. Asked pt rooming questions. All answers are negative. Ottis Stain, CMA

## 2019-04-19 NOTE — Progress Notes (Signed)
I was available preceptor for this visit.

## 2019-04-19 NOTE — Progress Notes (Signed)
[facility] Telemedicine Visit  Patient consented to have virtual visit. Method of visit: Telephone  Encounter participants: Patient: Antonio M Mandelbaum - located at home Provider: Kathrene Alu - located at [facility] Others (if applicable): none  Chief Complaint: discuss disability  HPI:  Ms. Enloe reports that her job told her that there is no position available for her yet that will protect her from fumes and dust, but they are actively looking for 1.  She can go back to work when they find that position.  In the meantime, she has filed for disability, and they need another form filled out by me explaining why she cannot be around dust and fumes.  She says that she is also applied for Medicaid and food stamps.  She does continue to have a cough, but her Anoro inhaler is working well for her and she is tolerating it better than she tolerated Advair.  Her Vraylar was also increased from 3 mg to 4.5 mg she is doing well overall and is looking forward to going back to work whenever a suitable position opens.  ROS: per HPI  Pertinent PMHx: COPD, tobacco use, migraines, depression, anxiety  Exam:  Respiratory: Patient was able to speak in full sentences and did not have audible shortness of breath.  She did not cough during our conversation.  Assessment/Plan:  COPD suggested by initial evaluation [facility] Patient appears to be on a suitable regimen of Anoro daily with albuterol PRN.  Unfortunately, a position away from dust and fumes at her workplace has not yet opened, but thankfully she will be able to work there once they do find an appropriate position.  We will continue Anoro daily and albuterol as needed and schedule her for pulmonary function testing after she sees me on 5/18.  Although I have filled out 3 forms previously, I will fill out her new disability paperwork explaining the need for workplace restrictions due to her COPD.    Time spent during visit with  patient: 9 minutes

## 2019-04-21 ENCOUNTER — Other Ambulatory Visit: Payer: Self-pay | Admitting: *Deleted

## 2019-04-21 ENCOUNTER — Other Ambulatory Visit (INDEPENDENT_AMBULATORY_CARE_PROVIDER_SITE_OTHER): Payer: BLUE CROSS/BLUE SHIELD

## 2019-04-21 DIAGNOSIS — R634 Abnormal weight loss: Secondary | ICD-10-CM

## 2019-04-21 DIAGNOSIS — R112 Nausea with vomiting, unspecified: Secondary | ICD-10-CM

## 2019-04-21 LAB — BASIC METABOLIC PANEL
BUN: 7 mg/dL (ref 6–23)
CO2: 26 mEq/L (ref 19–32)
Calcium: 8.8 mg/dL (ref 8.4–10.5)
Chloride: 91 mEq/L — ABNORMAL LOW (ref 96–112)
Creatinine, Ser: 0.64 mg/dL (ref 0.40–1.20)
GFR: 100.08 mL/min (ref >60.00)
Glucose, Bld: 90 mg/dL (ref 70–99)
Potassium: 3.5 mEq/L (ref 3.5–5.1)
Sodium: 126 mEq/L — ABNORMAL LOW (ref 135–145)

## 2019-04-22 ENCOUNTER — Telehealth: Payer: Self-pay | Admitting: *Deleted

## 2019-04-22 NOTE — Telephone Encounter (Signed)
Covid-19 travel screening questions  Have you traveled in the last 14 days? If yes where? No Do you now or have you had a fever in the last 14 days? No Do you have any respiratory symptoms of shortness of breath or cough now or in the last 14 days? Cough pt has COPD Do you have any family members or close contacts with diagnosed or suspected Covid-19?  No

## 2019-04-23 ENCOUNTER — Encounter: Payer: Self-pay | Admitting: Family Medicine

## 2019-04-23 ENCOUNTER — Other Ambulatory Visit: Payer: Self-pay

## 2019-04-23 ENCOUNTER — Other Ambulatory Visit: Payer: Self-pay | Admitting: Family Medicine

## 2019-04-23 ENCOUNTER — Ambulatory Visit (INDEPENDENT_AMBULATORY_CARE_PROVIDER_SITE_OTHER)
Admission: RE | Admit: 2019-04-23 | Discharge: 2019-04-23 | Disposition: A | Discharge status: Home / Self Care | Payer: BLUE CROSS/BLUE SHIELD | Source: Ambulatory Visit | Attending: Gastroenterology | Admitting: Gastroenterology

## 2019-04-23 DIAGNOSIS — G8929 Other chronic pain: Secondary | ICD-10-CM

## 2019-04-23 DIAGNOSIS — K56609 Unspecified intestinal obstruction, unspecified as to partial versus complete obstruction: Secondary | ICD-10-CM

## 2019-04-23 DIAGNOSIS — R112 Nausea with vomiting, unspecified: Secondary | ICD-10-CM

## 2019-04-23 DIAGNOSIS — I10 Essential (primary) hypertension: Secondary | ICD-10-CM

## 2019-04-23 DIAGNOSIS — R634 Abnormal weight loss: Secondary | ICD-10-CM

## 2019-04-23 DIAGNOSIS — R1013 Epigastric pain: Secondary | ICD-10-CM | POA: Diagnosis not present

## 2019-04-23 MED ORDER — IOHEXOL 300 MG/ML  SOLN
100.0000 mL | Freq: Once | INTRAMUSCULAR | Status: AC | PRN
  Administered 2019-04-23: 100 mL via INTRAVENOUS

## 2019-04-24 ENCOUNTER — Other Ambulatory Visit: Payer: Self-pay | Admitting: Family Medicine

## 2019-04-26 ENCOUNTER — Encounter: Payer: Self-pay | Admitting: Family Medicine

## 2019-04-27 ENCOUNTER — Encounter: Payer: Self-pay | Admitting: Family Medicine

## 2019-04-28 ENCOUNTER — Telehealth: Payer: Self-pay

## 2019-04-28 NOTE — Telephone Encounter (Signed)
Pt has had to cancel the enteroscopy due to being out of town and cannot follow the pre procedure COVID-19 requirements. se Dr Loletha Carrow' recommendation below.   That is very disappointing, since the outpatient hospital procedure time is extraordinarily difficult to get due to COVID restrictions.   Please try for Wed, 5/27 AM when I am doing hospital consult work.  If not available, or patient unable/unwilling to accommodate tat, then it will most likely have to wait until regular endoscopy block time resumes in June.

## 2019-04-30 ENCOUNTER — Other Ambulatory Visit (HOSPITAL_COMMUNITY): Admission: RE | Admit: 2019-04-30 | Payer: BLUE CROSS/BLUE SHIELD | Source: Ambulatory Visit

## 2019-04-30 NOTE — Telephone Encounter (Signed)
Pt would like to schedule enteroscopy.

## 2019-05-03 ENCOUNTER — Encounter: Payer: Self-pay | Admitting: Family Medicine

## 2019-05-03 ENCOUNTER — Other Ambulatory Visit: Payer: Self-pay

## 2019-05-03 ENCOUNTER — Telehealth: Payer: Self-pay | Admitting: Gastroenterology

## 2019-05-03 ENCOUNTER — Ambulatory Visit (INDEPENDENT_AMBULATORY_CARE_PROVIDER_SITE_OTHER): Payer: BLUE CROSS/BLUE SHIELD | Admitting: Family Medicine

## 2019-05-03 VITALS — BP 110/70 | HR 69

## 2019-05-03 DIAGNOSIS — J449 Chronic obstructive pulmonary disease, unspecified: Secondary | ICD-10-CM | POA: Diagnosis not present

## 2019-05-03 DIAGNOSIS — R112 Nausea with vomiting, unspecified: Secondary | ICD-10-CM

## 2019-05-03 DIAGNOSIS — E871 Hypo-osmolality and hyponatremia: Secondary | ICD-10-CM | POA: Insufficient documentation

## 2019-05-03 DIAGNOSIS — G8929 Other chronic pain: Secondary | ICD-10-CM

## 2019-05-03 DIAGNOSIS — K5900 Constipation, unspecified: Secondary | ICD-10-CM

## 2019-05-03 DIAGNOSIS — R1013 Epigastric pain: Secondary | ICD-10-CM

## 2019-05-03 DIAGNOSIS — R634 Abnormal weight loss: Secondary | ICD-10-CM

## 2019-05-03 DIAGNOSIS — K56609 Unspecified intestinal obstruction, unspecified as to partial versus complete obstruction: Secondary | ICD-10-CM

## 2019-05-03 NOTE — Assessment & Plan Note (Addendum)
Some improvement with Anoro Ellipta, although patient still continues to take albuterol more frequently than I would prefer.  She may need to try Trelegy in the future.  Encouraged her to continue to work on smoking cessation and said that we can always try Wellbutrin or Chantix in the future if she is interested.  Told her that this is the most important factor for improving her breathing and stopping the progression of COPD.  Will look into filling out this new paperwork for her disability.  Patient will schedule PFTs after her endoscopy is performed.

## 2019-05-03 NOTE — Telephone Encounter (Signed)
Patient reports that she is haivng per-umbilical pain.  Pain started last week.  It is intermittent, somewhat positional.  Her pain improves " a little" when she changes position.  Pain is at the worst with sitting.  She is tolerating a diet, is having BM daily for the last 3 days.  She is scheduled for an enteroscopy with you next Wednesday.  Please advise

## 2019-05-03 NOTE — Telephone Encounter (Signed)
Pt has been scheduled for 05-12-2019 @ 815 at [facility]. The COVID 19 pre screen been scheduled for 05-07-2019.

## 2019-05-03 NOTE — Telephone Encounter (Signed)
Patient called said that her stomach has been hurting and that it feels like someone is squeezing her

## 2019-05-03 NOTE — Progress Notes (Signed)
Subjective:    Mariah Rice - 48 y.o. female MRN [id number]  Date of birth: [birth year]  CC:  Mariah Rice is here for follow up of her COPD and abdominal pain and constipation  HPI:  COPD Patient uses Anoro Ellipta once daily, which seems to work better than the Advair and does not cause a burning sensation in her mouth.  She still uses her albuterol about 2-3 times every day.  She continues to work on finding a position within her job that we will keep her from inhaling fumes that exacerbate her COPD.  She has more forms that she needs to be filled out for disability although she is not sure exactly what the processes for this.  She continues to smoke about 8 cigarettes/day, which is less than she has been.  She has Nicorette gum that she is using and she also chews regular gum to help curb cravings.  She plans to use the patches as well.  She is interested in trying Wellbutrin in the future if nicotine replacement does not work.  She plans on getting PFTs done with Dr. Everitt Amber after her endoscopy is performed on 5/27.  Abdominal pain and constipation Follows with Dr. Loletha Carrow with [facility].  She has called to inform him of this pain and pressure that she feels in her abdomen, and he says there is nothing to do until she gets the EGD performed later this month.  She plans to have an EGD performed on 5/27 but must be tested for COVID and stay at home for 5 days or until testing returns negative.  She had an EGD performed earlier in May which did not show a definitive cause of her abdominal pain and constipation.  She has stopped her triamterene-HCTZ due to her sodium of 126 earlier this month.  She has 1 hard bowel movement every 3 to 5 days that often involves straining.  Health Maintenance:  There are no preventive care reminders to display for this patient.  -  reports that she has been smoking cigarettes. She has been smoking about 0.50 packs per day. She has never used smokeless tobacco.  - Review of Systems: Per HPI. - Past Medical History: Patient Active Problem List   Diagnosis Date Noted  . Hyponatremia 05/03/2019  . Genetic testing 03/08/2019  . SOB (shortness of breath) 02/23/2019  . Viral respiratory illness 02/15/2019  . COPD suggested by initial evaluation ([place]) 02/15/2019  . Family history of breast cancer 01/26/2019  . Constipation 01/26/2019  . Vomiting and diarrhea 01/26/2019  . Unintentional weight loss 12/22/2018  . Intermittent vomiting 12/22/2018  . Edema 08/19/2018  . Low back pain potentially associated with radiculopathy 06/06/2018  . Prediabetes 05/31/2018  . Tinea pedis of both feet 05/31/2018  . Muscle strain of left gluteal region 03/20/2018  . Migraine 03/20/2018  . Left hip pain 02/11/2018  . Migraine without aura and with status migrainosus, not intractable 11/30/2017  . Diarrhea of presumed infectious origin 09/19/2017  . Skin tag 08/05/2017  . Tick bite 05/13/2017  . Numbness and tingling in both hands 01/29/2017  . Rosacea 10/14/2016  . Mood disorder ([place]) 10/06/2016  . Tingling sensation 08/28/2016  . Rash and nonspecific skin eruption 12/27/2015  . Pain in finger of left hand 12/27/2015  . GERD (gastroesophageal reflux disease) 10/19/2015  . H/O bilateral oophorectomy 08/13/2015  . S/P laparoscopic assisted vaginal hysterectomy (LAVH) 07/11/2015  . Family history of ovarian cancer 05/04/2015  . Back pain  03/15/2015  . Dermoid cyst of arm 12/19/2014  . Environmental allergies 09/30/2013  . Depression 01/06/2013  . GAD (generalized anxiety disorder) 01/06/2013  . Headache 11/24/2012  . Tobacco abuse 11/10/2012  . Overweight 11/10/2012  . Hypertension 11/10/2012   - Medications: reviewed and updated   Objective:   Physical Exam BP 110/70   Pulse 69   LMP  (LMP Unknown) Comment: on [place]  SpO2 98%  Gen: NAD, alert, cooperative with exam, well-appearing CV: RRR, good S1/S2, no murmur, no edema Resp: CTABL, no wheezes,  non-labored, prolonged expiration Skin: no rashes, normal turgor  Neuro: no gross deficits.  Psych: good insight, alert and oriented        Assessment & Plan:   COPD suggested by initial evaluation ([place]) Some improvement with Anoro Ellipta, although patient still continues to take albuterol more frequently than I would prefer.  She may need to try Trelegy in the future.  Encouraged her to continue to work on smoking cessation and said that we can always try Wellbutrin or Chantix in the future if she is interested.  Told her that this is the most important factor for improving her breathing and stopping the progression of COPD.  Will look into filling out this new paperwork for her disability.  Patient will schedule PFTs after her endoscopy is performed.  Hyponatremia Patient was found to be hyponatremic at 126 earlier this month, so her medication triamterene-HCTZ was stopped.  Her blood pressure is good today at 110/70, so there is no need to increase her dose of amlodipine now, but we can always do this in the future if needed.  We will repeat a BMP today.  Recommended that she use MiraLAX as needed for constipation.  She will follow up with [facility] for her GI concerns.  I will write a letter for her over MyChart that we will excuse her from work until her endoscopy is performed.    Maia Breslow, M.D. 05/03/2019, 2:26 PM PGY-2, [place]

## 2019-05-03 NOTE — Telephone Encounter (Signed)
Understood, thanks.  That date will work.

## 2019-05-03 NOTE — Telephone Encounter (Signed)
Yes, this is from the partial small bowel obstruction that she has.  It is the reason I referred her to surgery and also why we have the scope scheduled for next week to see why the bowel looks swollen on the CT scan.    I'm afraid I don't have any medicine that would help for that - we have to finish the testing to help surgeons with their plan.  If she develops intractable vomiting and stops having bowel movements, then that could mean worsening of the obstruction, and she should call back.  Diet should be low fiber and have plenty of liquids.  Lastly, I sent her PCP the results of recent blood work regarding the low sodium level and need to consider changing blood pressure medicine.  If Mariah Rice has not heard from them yet, she should call Dr. Maudie Flakes office today.

## 2019-05-03 NOTE — Telephone Encounter (Signed)
Patient notified of the recommendations She has an office visit today with her PCP

## 2019-05-03 NOTE — Assessment & Plan Note (Addendum)
Patient was found to be hyponatremic at 126 earlier this month, so her medication triamterene-HCTZ was stopped.  Her blood pressure is good today at 110/70, so there is no need to increase her dose of amlodipine now, but we can always do this in the future if needed.  We will repeat a BMP today.  Recommended that she use MiraLAX as needed for constipation.  She will follow up with [facility] for her GI concerns.  I will write a letter for her over MyChart that we will excuse her from work until her endoscopy is performed.

## 2019-05-03 NOTE — Patient Instructions (Addendum)
It was nice seeing you today Ms. Klare!  Continue taking your medications as you have been, and we can always increase your amlodipine dose to 10 mg of your blood pressure is high in the future.  I will call the disability company to figure out what they need from [place].  I will also send you a letter over [place] saying that you need to wait to return to work until after your endoscopy.  We are checking your sodium today since it was low at your GI office.  I will let you know if that result is not normal.  You are doing a great job working on your smoking, keep it up!  We can have other medications we can try if you are interested in the future.  Please call the office to set up a time to get your breathing function tested after your endoscopy.  Try buying some MiraLAX and using it as needed to help with your constipation.  I will see you back in 6 months or sooner if you need.  If you have any questions or concerns, please feel free to call the clinic.   Be well,  Dr. Shan Levans

## 2019-05-04 ENCOUNTER — Ambulatory Visit (HOSPITAL_COMMUNITY)
Admission: RE | Admit: 2019-05-04 | Payer: BLUE CROSS/BLUE SHIELD | Source: Home / Self Care | Admitting: Gastroenterology

## 2019-05-04 ENCOUNTER — Encounter: Payer: Self-pay | Admitting: Family Medicine

## 2019-05-04 ENCOUNTER — Encounter (HOSPITAL_COMMUNITY): Admission: RE | Payer: Self-pay | Source: Home / Self Care

## 2019-05-04 ENCOUNTER — Other Ambulatory Visit: Payer: Self-pay

## 2019-05-04 DIAGNOSIS — K56609 Unspecified intestinal obstruction, unspecified as to partial versus complete obstruction: Secondary | ICD-10-CM

## 2019-05-04 LAB — BASIC METABOLIC PANEL
BUN/Creatinine Ratio: 13 (ref 9–23)
BUN: 10 mg/dL (ref 6–24)
CO2: 21 mmol/L (ref 20–29)
Calcium: 8.8 mg/dL (ref 8.7–10.2)
Chloride: 100 mmol/L (ref 96–106)
Creatinine, Ser: 0.78 mg/dL (ref 0.57–1.00)
GFR calc Af Amer: 106 mL/min/{1.73_m2} (ref >59)
GFR calc non Af Amer: 92 mL/min/{1.73_m2} (ref >59)
Glucose: 105 mg/dL — ABNORMAL HIGH (ref 65–99)
Potassium: 4.4 mmol/L (ref 3.5–5.2)
Sodium: 133 mmol/L — ABNORMAL LOW (ref 134–144)

## 2019-05-04 SURGERY — ENTEROSCOPY
Anesthesia: Monitor Anesthesia Care

## 2019-05-06 ENCOUNTER — Telehealth: Payer: Self-pay | Admitting: Gastroenterology

## 2019-05-06 NOTE — Telephone Encounter (Signed)
Pt aware if she cancels today it is unknown when we can get her rescheduled. She will call back before the end of the day to let us know if she wants to truly cancel.

## 2019-05-06 NOTE — Telephone Encounter (Signed)
I am very sorry to hear about a death in the family.  If next week's procedure were to be canceled, it would very likely be mid to late June before it could get on the restricted endoscopy schedule.  I cannot overstate how would prefer it did not wait that long.

## 2019-05-06 NOTE — Telephone Encounter (Signed)
Pt has decided to not cancel and will have her enteroscopy as scheduled.

## 2019-05-06 NOTE — Telephone Encounter (Signed)
Patient would like to cancel her procedure at [facility] on 5-27 and would like to talk about it with you. Patient said she had a death in the family.

## 2019-05-07 ENCOUNTER — Other Ambulatory Visit (HOSPITAL_COMMUNITY): Payer: BLUE CROSS/BLUE SHIELD

## 2019-05-07 ENCOUNTER — Encounter: Payer: Self-pay | Admitting: Family Medicine

## 2019-05-07 ENCOUNTER — Other Ambulatory Visit (HOSPITAL_COMMUNITY)
Admission: RE | Admit: 2019-05-07 | Discharge: 2019-05-07 | Disposition: A | Discharge status: Home / Self Care | Payer: BLUE CROSS/BLUE SHIELD | Source: Ambulatory Visit | Attending: Gastroenterology | Admitting: Gastroenterology

## 2019-05-07 DIAGNOSIS — Z1159 Encounter for screening for other viral diseases: Secondary | ICD-10-CM | POA: Insufficient documentation

## 2019-05-07 DIAGNOSIS — Z01812 Encounter for preprocedural laboratory examination: Secondary | ICD-10-CM | POA: Diagnosis present

## 2019-05-08 LAB — NOVEL CORONAVIRUS, NAA (HOSP ORDER, SEND-OUT TO REF LAB; TAT 18-24 HRS): SARS-CoV-2, NAA: NOT DETECTED

## 2019-05-11 ENCOUNTER — Encounter (HOSPITAL_COMMUNITY): Payer: Self-pay | Admitting: *Deleted

## 2019-05-11 ENCOUNTER — Other Ambulatory Visit: Payer: Self-pay

## 2019-05-12 ENCOUNTER — Ambulatory Visit (HOSPITAL_COMMUNITY): Payer: BLUE CROSS/BLUE SHIELD | Admitting: Registered Nurse

## 2019-05-12 ENCOUNTER — Encounter (HOSPITAL_COMMUNITY)
Admission: RE | Disposition: A | Discharge status: Home / Self Care | Payer: Self-pay | Source: Home / Self Care | Attending: Gastroenterology

## 2019-05-12 ENCOUNTER — Encounter (HOSPITAL_COMMUNITY): Payer: Self-pay | Admitting: *Deleted

## 2019-05-12 ENCOUNTER — Ambulatory Visit (HOSPITAL_COMMUNITY)
Admission: RE | Admit: 2019-05-12 | Discharge: 2019-05-12 | Disposition: A | Discharge status: Home / Self Care | Payer: BLUE CROSS/BLUE SHIELD | Attending: Gastroenterology | Admitting: Gastroenterology

## 2019-05-12 DIAGNOSIS — Z6832 Body mass index (BMI) 32.0-32.9, adult: Secondary | ICD-10-CM | POA: Diagnosis not present

## 2019-05-12 DIAGNOSIS — E669 Obesity, unspecified: Secondary | ICD-10-CM | POA: Insufficient documentation

## 2019-05-12 DIAGNOSIS — K219 Gastro-esophageal reflux disease without esophagitis: Secondary | ICD-10-CM | POA: Insufficient documentation

## 2019-05-12 DIAGNOSIS — R1033 Periumbilical pain: Secondary | ICD-10-CM | POA: Diagnosis not present

## 2019-05-12 DIAGNOSIS — R933 Abnormal findings on diagnostic imaging of other parts of digestive tract: Secondary | ICD-10-CM | POA: Diagnosis not present

## 2019-05-12 DIAGNOSIS — J449 Chronic obstructive pulmonary disease, unspecified: Secondary | ICD-10-CM | POA: Diagnosis not present

## 2019-05-12 DIAGNOSIS — R1110 Vomiting, unspecified: Secondary | ICD-10-CM | POA: Insufficient documentation

## 2019-05-12 DIAGNOSIS — G8929 Other chronic pain: Secondary | ICD-10-CM

## 2019-05-12 DIAGNOSIS — R634 Abnormal weight loss: Secondary | ICD-10-CM

## 2019-05-12 DIAGNOSIS — G43009 Migraine without aura, not intractable, without status migrainosus: Secondary | ICD-10-CM | POA: Diagnosis not present

## 2019-05-12 DIAGNOSIS — I10 Essential (primary) hypertension: Secondary | ICD-10-CM | POA: Insufficient documentation

## 2019-05-12 DIAGNOSIS — Z7951 Long term (current) use of inhaled steroids: Secondary | ICD-10-CM | POA: Diagnosis not present

## 2019-05-12 DIAGNOSIS — F419 Anxiety disorder, unspecified: Secondary | ICD-10-CM | POA: Insufficient documentation

## 2019-05-12 DIAGNOSIS — K56600 Partial intestinal obstruction, unspecified as to cause: Secondary | ICD-10-CM | POA: Diagnosis not present

## 2019-05-12 DIAGNOSIS — F329 Major depressive disorder, single episode, unspecified: Secondary | ICD-10-CM | POA: Insufficient documentation

## 2019-05-12 DIAGNOSIS — E0590 Thyrotoxicosis, unspecified without thyrotoxic crisis or storm: Secondary | ICD-10-CM | POA: Diagnosis not present

## 2019-05-12 DIAGNOSIS — R112 Nausea with vomiting, unspecified: Secondary | ICD-10-CM

## 2019-05-12 DIAGNOSIS — F1210 Cannabis abuse, uncomplicated: Secondary | ICD-10-CM | POA: Insufficient documentation

## 2019-05-12 DIAGNOSIS — R7303 Prediabetes: Secondary | ICD-10-CM | POA: Diagnosis not present

## 2019-05-12 DIAGNOSIS — R1013 Epigastric pain: Secondary | ICD-10-CM

## 2019-05-12 DIAGNOSIS — K56609 Unspecified intestinal obstruction, unspecified as to partial versus complete obstruction: Secondary | ICD-10-CM

## 2019-05-12 HISTORY — DX: Chronic obstructive pulmonary disease, unspecified: J44.9

## 2019-05-12 HISTORY — PX: ENTEROSCOPY: SHX5533

## 2019-05-12 SURGERY — ENTEROSCOPY
Anesthesia: Monitor Anesthesia Care

## 2019-05-12 MED ORDER — PROPOFOL 10 MG/ML IV BOLUS
INTRAVENOUS | Status: AC
  Filled 2019-05-12: qty 40

## 2019-05-12 MED ORDER — GLYCOPYRROLATE 0.2 MG/ML IJ SOLN
INTRAMUSCULAR | Status: DC | PRN
  Administered 2019-05-12: 0.2 mg via INTRAVENOUS

## 2019-05-12 MED ORDER — LACTATED RINGERS IV SOLN
INTRAVENOUS | Status: DC
  Administered 2019-05-12: 1000 mL via INTRAVENOUS

## 2019-05-12 MED ORDER — SCOPOLAMINE 1 MG/3DAYS TD PT72
MEDICATED_PATCH | TRANSDERMAL | Status: DC | PRN
  Administered 2019-05-12: 1 via TRANSDERMAL

## 2019-05-12 MED ORDER — SODIUM CHLORIDE 0.9 % IV SOLN: INTRAVENOUS | Status: DC

## 2019-05-12 MED ORDER — PROPOFOL 500 MG/50ML IV EMUL
INTRAVENOUS | Status: DC | PRN
  Administered 2019-05-12: 200 ug/kg/min via INTRAVENOUS

## 2019-05-12 MED ORDER — LIDOCAINE HCL (CARDIAC) PF 100 MG/5ML IV SOSY
PREFILLED_SYRINGE | INTRAVENOUS | Status: DC | PRN
  Administered 2019-05-12: 100 mg via INTRAVENOUS

## 2019-05-12 MED ORDER — ONDANSETRON HCL 4 MG/2ML IJ SOLN
INTRAMUSCULAR | Status: DC | PRN
  Administered 2019-05-12: 4 mg via INTRAVENOUS

## 2019-05-12 MED ORDER — SCOPOLAMINE 1 MG/3DAYS TD PT72
MEDICATED_PATCH | TRANSDERMAL | Status: AC
  Filled 2019-05-12: qty 1

## 2019-05-12 MED ORDER — PROPOFOL 10 MG/ML IV BOLUS
INTRAVENOUS | Status: AC
  Filled 2019-05-12: qty 20

## 2019-05-12 MED ORDER — DEXAMETHASONE SODIUM PHOSPHATE 10 MG/ML IJ SOLN
INTRAMUSCULAR | Status: DC | PRN
  Administered 2019-05-12: 10 mg via INTRAVENOUS

## 2019-05-12 SURGICAL SUPPLY — 14 items

## 2019-05-12 NOTE — Discharge Instructions (Signed)
YOU HAD AN ENDOSCOPIC PROCEDURE TODAY: Refer to the procedure report and other information in the discharge instructions given to you for any specific questions about what was found during the examination. If this information does not answer your questions, please call [facility] office at [phone] to clarify.  ° °YOU SHOULD EXPECT: Some feelings of bloating in the abdomen. Passage of more gas than usual. Walking can help get rid of the air that was put into your GI tract during the procedure and reduce the bloating. If you had a lower endoscopy (such as a colonoscopy or flexible sigmoidoscopy) you may notice spotting of blood in your stool or on the toilet paper. Some abdominal soreness may be present for a day or two, also. ° °DIET: Your first meal following the procedure should be a light meal and then it is ok to progress to your normal diet. A half-sandwich or bowl of soup is an example of a good first meal. Heavy or fried foods are harder to digest and may make you feel nauseous or bloated. Drink plenty of fluids but you should avoid alcoholic beverages for 24 hours. If you had a esophageal dilation, please see attached instructions for diet.   ° °ACTIVITY: Your care partner should take you home directly after the procedure. You should plan to take it easy, moving slowly for the rest of the day. You can resume normal activity the day after the procedure however YOU SHOULD NOT DRIVE, use power tools, machinery or perform tasks that involve climbing or major physical exertion for 24 hours (because of the sedation medicines used during the test).  ° °SYMPTOMS TO REPORT IMMEDIATELY: °A gastroenterologist can be reached at any hour. Please call [phone]  for any of the following symptoms:  °Following lower endoscopy (colonoscopy, flexible sigmoidoscopy) °Excessive amounts of blood in the stool  °Significant tenderness, worsening of abdominal pains  °Swelling of the abdomen that is new, acute  °Fever of 100° or  higher  °Following upper endoscopy (EGD, EUS, ERCP, esophageal dilation) °Vomiting of blood or coffee ground material  °New, significant abdominal pain  °New, significant chest pain or pain under the shoulder blades  °Painful or persistently difficult swallowing  °New shortness of breath  °Black, tarry-looking or red, bloody stools ° °FOLLOW UP:  °If any biopsies were taken you will be contacted by phone or by letter within the next 1-3 weeks. Call [phone]  if you have not heard about the biopsies in 3 weeks.  °Please also call with any specific questions about appointments or follow up tests. ° °

## 2019-05-12 NOTE — Anesthesia Procedure Notes (Signed)
Procedure Name: MAC Date/Time: 05/12/2019 8:26 AM Performed by: Lissa Morales, CRNA Pre-anesthesia Checklist: Patient identified, Emergency Drugs available, Suction available, Patient being monitored and Timeout performed Patient Re-evaluated:Patient Re-evaluated prior to induction Oxygen Delivery Method: Nasal cannula Placement Confirmation: positive ETCO2

## 2019-05-12 NOTE — Transfer of Care (Addendum)
Immediate Anesthesia Transfer of Care Note  Patient: Mariah Rice  Procedure(s) Performed: ENTEROSCOPY (N/A )  Patient Location: PACU  Anesthesia Type:Spinal  Level of Consciousness: awake, alert , oriented and patient cooperative  Airway & Oxygen Therapy: Patient Spontanous Breathing and Patient connected to nasal cannula oxygen  Post-op Assessment: Report given to RN, Post -op Vital signs reviewed and stable and Patient moving all extremities X 4  Post vital signs: stable  Last Vitals:  Vitals Value Taken Time  BP    Temp    Pulse 78 05/12/2019  8:55 AM  Resp 20 05/12/2019  8:55 AM  SpO2 100 % 05/12/2019  8:55 AM  Vitals shown include unvalidated device data.  Last Pain:  Vitals:   05/12/19 0717  TempSrc: Oral  PainSc: 0-No pain         Complications: No apparent anesthesia complications

## 2019-05-12 NOTE — H&P (Signed)
History:  This patient presents for endoscopic testing for vomiting.  CT scan revealing PSBO and proximal jejunal thickening.  Mariah Rice Referring physician: Kathrene Alu, MD  Past Medical History: Past Medical History:  Diagnosis Date  . Anxiety   . COPD (chronic obstructive pulmonary disease) ([place])   . Dental abscess   . Depression   . Family history of breast cancer   . Family history of ovarian cancer   . GERD (gastroesophageal reflux disease)   . Headache    migraines  . Hypertension   . Hyperthyroidism 1990s  . PONV (postoperative nausea and vomiting)   . Shortness of breath dyspnea    with anxiety     Past Surgical History: Past Surgical History:  Procedure Laterality Date  . INCISE AND DRAIN ABCESS  2005  . LAPAROSCOPIC ASSISTED VAGINAL HYSTERECTOMY N/A 07/11/2015   Procedure: LAPAROSCOPIC ASSISTED VAGINAL HYSTERECTOMY;  Surgeon: Osborne Oman, MD;  Location: [facility];  Service: Gynecology;  Laterality: N/A;  . LAPAROSCOPIC BILATERAL SALPINGECTOMY Bilateral 07/11/2015   Procedure: LAPAROSCOPIC BILATERAL SALPINGO OOPHORECTOMY ;  Surgeon: Osborne Oman, MD;  Location: [facility];  Service: Gynecology;  Laterality: Bilateral;  . tubal ligation  2000  . WISDOM TOOTH EXTRACTION      Allergies: Allergies  Allergen Reactions  . Meloxicam Nausea And Vomiting  . Sumatriptan     Reported nausea, rash, throat felt like it was closing up  . Penicillins Nausea Only and Rash    Has patient had a PCN reaction causing immediate rash, facial/tongue/throat swelling, SOB or lightheadedness with hypotension: no Has patient had a PCN reaction causing severe rash involving mucus membranes or skin necrosis:unknown Has patient had a PCN reaction that required hospitalization : yes Has patient had a PCN reaction occurring within the last 10 years: no If all of the above answers are "NO", then may proceed with Cephalosporin use.   . Sulfa Antibiotics Nausea Only and Rash     Outpatient Meds: Current Facility-Administered Medications  Medication Dose Route Frequency Provider Last Rate Last Dose  . 0.9 %  sodium chloride infusion   Intravenous Continuous Danis, Estill Cotta III, MD      . lactated ringers infusion   Intravenous Continuous Nelida Meuse III, MD 10 mL/hr at 05/12/19 0721 1,000 mL at 05/12/19 0721      ___________________________________________________________________ Objective   Exam:  BP 126/70   Pulse 64   Temp 97.8 F (36.6 C) (Oral)   Resp 20   Ht 5\' 4"  (1.626 m)   Wt 85.7 kg   LMP  (LMP Unknown) Comment: on Megace  SpO2 98%   BMI 32.44 kg/m    CV: RRR without murmur, S1/S2, no JVD, no peripheral edema  Resp: clear to auscultation bilaterally, normal RR and effort noted  GI: soft, periumbilical tenderness, with active bowel sounds. No guarding or palpable organomegaly noted.  Neuro: awake, alert and oriented x 3. Normal gross motor function and fluent speech   Assessment:  Vomiting, abnormal GI imaging  Plan:  SB enteroscopy   Nelida Meuse III

## 2019-05-12 NOTE — Anesthesia Preprocedure Evaluation (Signed)
Anesthesia Evaluation  Patient identified by MRN, date of birth, ID band Patient awake    Reviewed: Allergy & Precautions, H&P , NPO status , Patient's Chart, lab work & pertinent test results  History of Anesthesia Complications (+) PONV and history of anesthetic complications  Airway Mallampati: II   Neck ROM: full    Dental   Pulmonary shortness of breath, COPD, Current Smoker,    breath sounds clear to auscultation       Cardiovascular hypertension,  Rhythm:regular Rate:Normal     Neuro/Psych  Headaches, PSYCHIATRIC DISORDERS Anxiety Depression  Neuromuscular disease    GI/Hepatic GERD  ,  Endo/Other  Hyperthyroidism obese  Renal/GU      Musculoskeletal   Abdominal   Peds  Hematology   Anesthesia Other Findings   Reproductive/Obstetrics                             Anesthesia Physical Anesthesia Plan  ASA: II  Anesthesia Plan: MAC   Post-op Pain Management:    Induction: Intravenous  PONV Risk Score and Plan: 2 and Propofol infusion and Treatment may vary due to age or medical condition  Airway Management Planned: Nasal Cannula  Additional Equipment:   Intra-op Plan:   Post-operative Plan:   Informed Consent: I have reviewed the patients History and Physical, chart, labs and discussed the procedure including the risks, benefits and alternatives for the proposed anesthesia with the patient or authorized representative who has indicated his/her understanding and acceptance.       Plan Discussed with: CRNA and Anesthesiologist  Anesthesia Plan Comments:         Anesthesia Quick Evaluation

## 2019-05-12 NOTE — Anesthesia Postprocedure Evaluation (Signed)
Anesthesia Post Note  Patient: Mariah Rice  Procedure(s) Performed: ENTEROSCOPY (N/A )     Patient location during evaluation: Endoscopy Anesthesia Type: MAC Level of consciousness: oriented and awake and alert Pain management: pain level controlled Vital Signs Assessment: post-procedure vital signs reviewed and stable Respiratory status: spontaneous breathing, respiratory function stable and patient connected to nasal cannula oxygen Cardiovascular status: blood pressure returned to baseline and stable Postop Assessment: no headache, no backache and no apparent nausea or vomiting Anesthetic complications: no    Last Vitals:  Vitals:   05/12/19 0900 05/12/19 0905  BP: 118/71   Pulse: 66 65  Resp: 20 18  Temp:    SpO2: 99% 98%    Last Pain:  Vitals:   05/12/19 0900  TempSrc:   PainSc: 0-No pain                 [name] S

## 2019-05-12 NOTE — Op Note (Addendum)
[facility] Patient Name: Mariah Rice Procedure Date: 05/12/2019 MRN: [id number] Attending MD: Estill Cotta. Loletha Carrow , MD Date of Birth: June 26, 1971 CSN: [id number] Age: 48 Admit Type: Inpatient Procedure:                Small bowel enteroscopy Indications:              Unexplained periumbilical abdominal distress/pain,                            Abnormal abdominal CT, Nausea with vomiting (PSBO                            distal jejunum/proxmial ileum with marked proxmial                            jejunal wall thickening) Providers:                Estill Cotta. Loletha Carrow, MD, Cleda Daub, RN, Ladona Ridgel, Technician, Enrigue Catena, CRNA Referring MD:              Medicines:                Monitored Anesthesia Care Complications:            No immediate complications. Estimated Blood Loss:     Estimated blood loss: none. Procedure:                Pre-Anesthesia Assessment:                           - Prior to the procedure, a History and Physical                            was performed, and patient medications and                            allergies were reviewed. The patient's tolerance of                            previous anesthesia was also reviewed. The risks                            and benefits of the procedure and the sedation                            options and risks were discussed with the patient.                            All questions were answered, and informed consent                            was obtained. Prior Anticoagulants: The patient has  taken no previous anticoagulant or antiplatelet                            agents. ASA Grade Assessment: II - A patient with                            mild systemic disease. After reviewing the risks                            and benefits, the patient was deemed in                            satisfactory condition to undergo the procedure.      After obtaining informed consent, the endoscope was                            passed under direct vision. Throughout the                            procedure, the patient's blood pressure, pulse, and                            oxygen saturations were monitored continuously. The                            [id number]-Q180 ([id number]) Olympus enteroscope was                            introduced through the mouth and advanced to the                            proximal jejunum. The small bowel enteroscopy was                            accomplished without difficulty. The patient                            tolerated the procedure well. Scope In: Scope Out: Findings:      The esophagus was normal.      The stomach was normal.      The examined duodenum was normal.      There was no evidence of significant pathology in the entire examined       portion of jejunum (110cm of reduced scope was the extent of exam       possible). Impression:               - Normal esophagus.                           - Normal stomach.                           - Normal examined duodenum.                           - The examined portion  of the jejunum was normal.                           - No specimens collected. Moderate Sedation:      Not Applicable - Patient had care per Anesthesia. Recommendation:           - Patient has a contact number available for                            emergencies. The signs and symptoms of potential                            delayed complications were discussed with the                            patient. Return to normal activities tomorrow.                            Written discharge instructions were provided to the                            patient.                           - Low fiber diet.                           - Continue present medications.                           - Outpatient surgical consult will be arranged. Procedure Code(s):        --- Professional ---                            [phone], Small intestinal endoscopy, enteroscopy                            beyond second portion of duodenum, not including                            ileum; diagnostic, including collection of                            specimen(s) by brushing or washing, when performed                            (separate procedure) Diagnosis Code(s):        --- Professional ---                           Y65.03, Periumbilical pain                           R11.2, Nausea with vomiting, unspecified                           R93.3, Abnormal findings on diagnostic  imaging of                            other parts of digestive tract CPT copyright 2019 American Medical Association. All rights reserved. The codes documented in this report are preliminary and upon coder review may  be revised to meet current compliance requirements. Henry L. Loletha Carrow, MD 05/12/2019 8:56:31 AM This report has been signed electronically. Number of Addenda: 0

## 2019-05-12 NOTE — Interval H&P Note (Signed)
History and Physical Interval Note:  05/12/2019 8:02 AM  Mariah Rice  has presented today for surgery, with the diagnosis of partial small bowel bloackage.  The various methods of treatment have been discussed with the patient and family. After consideration of risks, benefits and other options for treatment, the patient has consented to  Procedure(s): ESOPHAGOGASTRODUODENOSCOPY (EGD) WITH PROPOFOL (N/A) ENTEROSCOPY (N/A) as a surgical intervention.  The patient's history has been reviewed, patient examined, no change in status, stable for surgery.  I have reviewed the patient's chart and labs.  Questions were answered to the patient's satisfaction.     Nelida Meuse III

## 2019-05-14 ENCOUNTER — Telehealth: Payer: Self-pay

## 2019-05-14 ENCOUNTER — Other Ambulatory Visit: Payer: Self-pay

## 2019-05-14 ENCOUNTER — Telehealth (INDEPENDENT_AMBULATORY_CARE_PROVIDER_SITE_OTHER): Payer: BLUE CROSS/BLUE SHIELD | Admitting: Family Medicine

## 2019-05-14 ENCOUNTER — Telehealth: Payer: Self-pay | Admitting: Family Medicine

## 2019-05-14 ENCOUNTER — Encounter: Payer: Self-pay | Admitting: Family Medicine

## 2019-05-14 DIAGNOSIS — R1110 Vomiting, unspecified: Secondary | ICD-10-CM

## 2019-05-14 DIAGNOSIS — R197 Diarrhea, unspecified: Secondary | ICD-10-CM

## 2019-05-14 DIAGNOSIS — J449 Chronic obstructive pulmonary disease, unspecified: Secondary | ICD-10-CM | POA: Diagnosis not present

## 2019-05-14 MED ORDER — OXYCODONE HCL 5 MG PO TABS: 5.0000 mg | ORAL_TABLET | Freq: Three times a day (TID) | ORAL | 0 refills | Status: AC | PRN

## 2019-05-14 MED ORDER — ONDANSETRON HCL 4 MG PO TABS: 4.0000 mg | ORAL_TABLET | Freq: Three times a day (TID) | ORAL | 0 refills | Status: DC | PRN

## 2019-05-14 NOTE — Progress Notes (Signed)
[place] Telemedicine Visit  Patient consented to have virtual visit. Method of visit: Video  Encounter participants: Patient: Mariah Rice - located at home Provider: Kathrene Alu - located at [facility] Others (if applicable): none  Chief Complaint: Disability paperwork, nausea, shortness of breath  HPI:  Disability paperwork Patient says that [employer] has not yet received her disability paperwork even though they have been sent in twice from our end.  Nausea Patient says that she recently had an endoscopy where her GI doctor told her that she likely has Crohn's disease in her small intestine and she will need surgery to better characterize this.  He started her on a medicine for Crohn's disease but she is unsure what it is called.  However, according to the enteroscopy report, no significant pathology was noted, and there are no new medications mentioned.  She reports that she had some nausea and vomiting overnight and would like a refill of her Zofran if that is possible.  She says that the ODT tablets actually increase her nausea, so she would like to try the p.o. tablets.  Shortness of breath She has had increased shortness of breath recently and has had to increase her albuterol usage to 2-3 times per day, which is more than her usual once per day.  She denies cough, fever, or wheezing  ROS: per HPI  Pertinent PMHx: COPD, tobacco use, migraine, nausea and vomiting  Exam: General: Patient appears well and in no acute distress Respiratory: Patient able to speak in full sentences without experiencing shortness of breath, no cough noted  Assessment/Plan:  COPD suggested by initial evaluation ([place]) Will refax paperwork again today and call [employer] to make sure they have received it.  I am not sure why they have not received the other two copies.  We will also see patient in office on Tuesday, June 2 to evaluate her for possible COPD exacerbation since she is  needing to use her albuterol more and reports greater shortness of breath.  We have also scheduled her PFTs to be done on Thursday, June 4.  Vomiting and diarrhea Will send in Zofran tablets.  Will follow patient's GI work-up and discuss her endoscopy results with her on Tuesday since it does not appear that patient understands exactly what these are.    Time spent during visit with patient: 12 minutes

## 2019-05-14 NOTE — Telephone Encounter (Signed)
Please let me know which doctor she will see so I can message them about her.  Also, please let Howard know I have sent a prescription to her pharmacy for a small supply of pain medication to take sparingly if she has severe pain, which will get her through until she can see the surgeon.

## 2019-05-14 NOTE — Assessment & Plan Note (Addendum)
Will refax paperwork again today and call [employer] to make sure they have received it.  I am not sure why they have not received the other two copies.  We will also see patient in office on Tuesday, June 2 to evaluate her for possible COPD exacerbation since she is needing to use her albuterol more and reports greater shortness of breath.  We have also scheduled her PFTs to be done on Thursday, June 4.

## 2019-05-14 NOTE — Telephone Encounter (Signed)
Faxed patient's disability form and accompanying office notes for the third time today.  A member of our front office staff will call [employer] to ensure that it was received.

## 2019-05-14 NOTE — Telephone Encounter (Signed)
Pt has been scheduled for 6-18 -2020 @1020am  with  [facility] for PSBO.

## 2019-05-14 NOTE — Assessment & Plan Note (Signed)
Will send in Zofran tablets.  Will follow patient's GI work-up and discuss her endoscopy results with her on Tuesday since it does not appear that patient understands exactly what these are.

## 2019-05-14 NOTE — Telephone Encounter (Signed)
Sorry, its Dr Rosendo Gros on 6-18.

## 2019-05-17 ENCOUNTER — Encounter (HOSPITAL_COMMUNITY): Payer: Self-pay | Admitting: Gastroenterology

## 2019-05-18 ENCOUNTER — Other Ambulatory Visit: Payer: Self-pay

## 2019-05-18 ENCOUNTER — Other Ambulatory Visit: Payer: Self-pay | Admitting: Family Medicine

## 2019-05-18 ENCOUNTER — Encounter: Payer: Self-pay | Admitting: Family Medicine

## 2019-05-18 ENCOUNTER — Ambulatory Visit (INDEPENDENT_AMBULATORY_CARE_PROVIDER_SITE_OTHER): Payer: BC Managed Care – PPO | Admitting: Family Medicine

## 2019-05-18 VITALS — BP 118/62 | HR 72 | Temp 98.5°F

## 2019-05-18 DIAGNOSIS — I10 Essential (primary) hypertension: Secondary | ICD-10-CM

## 2019-05-18 DIAGNOSIS — Z720 Tobacco use: Secondary | ICD-10-CM | POA: Diagnosis not present

## 2019-05-18 DIAGNOSIS — R0602 Shortness of breath: Secondary | ICD-10-CM | POA: Diagnosis not present

## 2019-05-18 MED ORDER — BUPROPION HCL ER (SR) 150 MG PO TB12: 150.0000 mg | ORAL_TABLET | Freq: Two times a day (BID) | ORAL | 2 refills | Status: DC

## 2019-05-18 NOTE — Patient Instructions (Addendum)
It was nice seeing you today Ms. Allebach!  Let us try Wellbutrin for smoking cessation today.  You need to start with 1 pill/day for the first 3 days then go to 2 pills/day, 1 in the morning and 1 in the evening for up to 3 to 4 months.  That your quit date for about 2 weeks after starting the medicine.  If you do not quit by that time, just keep working on cutting down.  I also want to obtain an ultrasound of your heart to make sure that it is not causing your shortness of breath.  Please let me know if your breathing worsens.  I would like for you to see me in 2 weeks to check on how your smoking and your breathing are doing.  If you have any questions or concerns, please feel free to call the clinic.   Be well,  Dr. Shan Levans

## 2019-05-18 NOTE — Progress Notes (Addendum)
Subjective:    Mariah Rice - 47 y.o. female MRN [id number]  Date of birth: Jun 28, 1971  CC:  Mariah Rice is here for shortness of breath.  HPI: -Patient reports that she has been needing her albuterol inhaler 2-3 times per day for shortness of breath -Continues to take Anoro Ellipta inhaler once daily as prescribed -Continues to smoke 10 cigarettes per day but continues to work on quitting -Is open to starting a medication to help her quit -Has to prop herself up on 6-7 pillows at night to able to breathe better and says that she will cough frequently during the night -Cough is productive of clear sputum  Health Maintenance:   There are no preventive care reminders to display for this patient.  -  reports that she has been smoking cigarettes. She has a 15.00 pack-year smoking history. She has never used smokeless tobacco. - Review of Systems: Per HPI. - Past Medical History: Patient Active Problem List   Diagnosis Date Noted  . Nausea and vomiting in adult   . Small bowel obstruction ([place])   . Periumbilical abdominal pain   . Abnormal finding on GI tract imaging   . Hyponatremia 05/03/2019  . Genetic testing 03/08/2019  . Shortness of breath 02/23/2019  . Viral respiratory illness 02/15/2019  . COPD suggested by initial evaluation ([place]) 02/15/2019  . Family history of breast cancer 01/26/2019  . Constipation 01/26/2019  . Vomiting and diarrhea 01/26/2019  . Unintentional weight loss 12/22/2018  . Intermittent vomiting 12/22/2018  . Edema 08/19/2018  . Low back pain potentially associated with radiculopathy 06/06/2018  . Prediabetes 05/31/2018  . Tinea pedis of both feet 05/31/2018  . Muscle strain of left gluteal region 03/20/2018  . Migraine 03/20/2018  . Left hip pain 02/11/2018  . Migraine without aura and with status migrainosus, not intractable 11/30/2017  . Diarrhea of presumed infectious origin 09/19/2017  . Skin tag 08/05/2017  . Tick bite 05/13/2017   . Numbness and tingling in both hands 01/29/2017  . Rosacea 10/14/2016  . Mood disorder ([place]) 10/06/2016  . Tingling sensation 08/28/2016  . Rash and nonspecific skin eruption 12/27/2015  . Pain in finger of left hand 12/27/2015  . GERD (gastroesophageal reflux disease) 10/19/2015  . H/O bilateral oophorectomy 08/13/2015  . S/P laparoscopic assisted vaginal hysterectomy (LAVH) 07/11/2015  . Family history of ovarian cancer 05/04/2015  . Back pain 03/15/2015  . Dermoid cyst of arm 12/19/2014  . Environmental allergies 09/30/2013  . Depression 01/06/2013  . GAD (generalized anxiety disorder) 01/06/2013  . Headache 11/24/2012  . Tobacco abuse 11/10/2012  . Overweight 11/10/2012  . Hypertension 11/10/2012   - Medications: reviewed and updated   Objective:   Physical Exam BP 118/62   Pulse 72   Temp 98.5 F (36.9 C) (Oral)   LMP  (LMP Unknown) Comment: on [facility]  SpO2 96%  Gen: NAD, alert, cooperative with exam, well-appearing CV: RRR, good S1/S2, no murmur, no edema Resp: CTABL, no wheezes, non-labored, no crackles, prolonged exhalation which seems to be her baseline Skin: no rashes, normal turgor  Neuro: no gross deficits.  Psych: good insight, alert and oriented        Assessment & Plan:   Shortness of breath Patient does not appear to have a COPD exacerbation because her pulse ox is normal, she is speaking in full sentences without shortness of breath, and she is not coughing frequently during our visit.  She also does not note a change in  her sputum production or quality of her sputum recently.  It is also possible that her shortness of breath is due to a non-pulmonary issue such as heart failure.  Although this is less likely given her lack of lung crackles or pedal edema, patient is at high risk given her obesity, hypertension, and long-term tobacco use.  Will obtain an echo to rule this out.  She is me in for PFTs on 6/4, which will help guide further management of her  COPD.  Patient was encouraged to call the clinic if her shortness of breath worsens.  I would like to see her back in 2 weeks to monitor her breathing and to change therapy as needed.  Tobacco abuse Understands that her continued tobacco use is contributing largely to her continued shortness of breath.  She is willing to start Wellbutrin today.  We will start Wellbutrin 150 mg once daily for the first 3 days then transition to twice daily dosing.  Patient is to set her quit date for 2 weeks after starting this medication.  We will follow-up about this in 2 weeks.    Maia Breslow, M.D. 05/20/2019, 8:28 AM PGY-2, [place] [facility]

## 2019-05-19 NOTE — Assessment & Plan Note (Addendum)
Patient does not appear to have a COPD exacerbation because her pulse ox is normal, she is speaking in full sentences without shortness of breath, and she is not coughing frequently during our visit.  She also does not note a change in her sputum production or quality of her sputum recently.  It is also possible that her shortness of breath is due to a non-pulmonary issue such as heart failure.  Although this is less likely given her lack of lung crackles or pedal edema, patient is at high risk given her obesity, hypertension, and long-term tobacco use.  Will obtain an echo to rule this out.  She is me in for PFTs on 6/4, which will help guide further management of her COPD.  Patient was encouraged to call the clinic if her shortness of breath worsens.  I would like to see her back in 2 weeks to monitor her breathing and to change therapy as needed.

## 2019-05-20 ENCOUNTER — Telehealth: Payer: Self-pay | Admitting: Gastroenterology

## 2019-05-20 ENCOUNTER — Encounter: Payer: Self-pay | Admitting: Pharmacist

## 2019-05-20 ENCOUNTER — Ambulatory Visit (INDEPENDENT_AMBULATORY_CARE_PROVIDER_SITE_OTHER): Payer: BC Managed Care – PPO | Admitting: Pharmacist

## 2019-05-20 ENCOUNTER — Other Ambulatory Visit: Payer: Self-pay

## 2019-05-20 VITALS — BP 122/68 | HR 57 | Ht 64.0 in | Wt 200.8 lb

## 2019-05-20 DIAGNOSIS — I10 Essential (primary) hypertension: Secondary | ICD-10-CM

## 2019-05-20 DIAGNOSIS — R0602 Shortness of breath: Secondary | ICD-10-CM | POA: Diagnosis not present

## 2019-05-20 DIAGNOSIS — Z720 Tobacco use: Secondary | ICD-10-CM

## 2019-05-20 DIAGNOSIS — J449 Chronic obstructive pulmonary disease, unspecified: Secondary | ICD-10-CM | POA: Diagnosis not present

## 2019-05-20 MED ORDER — PROPRANOLOL HCL ER 60 MG PO CP24: 60.0000 mg | ORAL_CAPSULE | Freq: Every day | ORAL | 2 refills | Status: DC

## 2019-05-20 MED ORDER — BECLOMETHASONE DIPROP HFA 40 MCG/ACT IN AERB: 2.0000 | INHALATION_SPRAY | Freq: Two times a day (BID) | RESPIRATORY_TRACT | 2 refills | Status: DC

## 2019-05-20 MED ORDER — BUPROPION HCL ER (SR) 150 MG PO TB12: 150.0000 mg | ORAL_TABLET | Freq: Every day | ORAL | 2 refills | Status: DC

## 2019-05-20 NOTE — Telephone Encounter (Signed)
[place] controlled substance policy only allows me to write 5 day supply, which I did. They were to be used sparingly, allowing them last until surgical evaluation.  She will be seeing the surgeon soon, whom I also contacted and asked to move up her appointment if possible.

## 2019-05-20 NOTE — Assessment & Plan Note (Signed)
Patient deriving migraine prevention benefit from propranolol, though mildly bradycardic at visit today, which could be contributing to fatigue. Reduced propranolol LA from 80 mg daily to 60 mg daily. Counseled patient to increase back to 80 mg daily if recurrence of migraines.

## 2019-05-20 NOTE — Telephone Encounter (Signed)
Pt. has been notified and aware. Advised her to call [facility] and request sooner appointment as well.

## 2019-05-20 NOTE — Progress Notes (Signed)
S:    Patient arrives in good spirits, ambulating without assistance.   Presents for lung function evaluation.   Patient was referred by PCP  Dr. Shan Levans (referred on 05/18/2019).  Patient reports breathing has been problematic for ~ 8 months.  Her dyspnea has led to her being out of work since March (3 months ago)   Patient reports adherence to medications (Anoro daily AND Albuterol MDI - TID)  Notes that Albuterol "does NOT help much". Previously, was on Advair, but reported developing thrush, so was switched to Anoro. She does note that she rinsed her mouth after use.   Patient reports last dose of COPD medications was this AM when she used Anoro at 5:30 AM.  Denies use of albuterol since yesterday.  Patient exacerbation hx: infrequent.   Denies ER or urgent care visits.  Previous 1 ppd smoker - Age 48 when started. Reports decreasing to about 6-7 cigarettes daily.  Tried quitting with patches and gum on separate occasions. Has been prescribed bupropion, but has not had the money to pick up yet.   Age when started using tobacco on a daily basis: 13. Currently using 6-7 cigarettes daily.  Longest time ever been tobacco free 1 day.  Medications used in past cessation efforts include: NRT (patches and gum, separately). She has 14 mg nicotine patches and 4 mg gum at home.   On a scale of 1-10, how important is it for you to quit smoking?: 10 On a scale of 1-10, how confident are you that you can quit during the month of June?: 7   Motivation to quit: her health, her aunt's health  Upon medication review, patient reports that she has been taking propranolol for migraine prevention ~ 2 years, notes that she feels it has helped reduce migraine frequency.   O: Physical Exam Constitutional:      Appearance: Normal appearance.  Pulmonary:     Effort: Pulmonary effort is normal.  Neurological:     Mental Status: She is alert.    Review of Systems  Respiratory: Positive for cough, sputum  production and shortness of breath.   Gastrointestinal: Positive for abdominal pain.       Requested additional narcotics from GI physician for abdominal pain this AM  All other systems reviewed and are negative.    Vitals:   05/20/19 0854  BP: 122/68  Pulse: (!) 57  SpO2: 97%    mMRC score= >2 CAT score= 28/40 See Documentation Flowsheet - CAT/COPD for complete symptom scoring.  See "scanned report" or Documentation Flowsheet (discrete results - PFTs) for Spirometry results. Patient provided good effort while attempting spirometry.   Lung Age = 24 Albuterol Neb  Lot# [id number]     Exp. 09/2020  A/P: Patient has been experiencing worse shortness of breath, coughing for 8 months and taking Anoro for the past 1.5 months.  Spirometry evaluation reveals restrictive lung disease, post nebulized albuterol tx revealed no reversibility. [facility] based on CAT score and exacerbations in the last year. - Continue Anoro. Due to some restriction, will start ICS. Patient had thrush with fluticasone, so will try alternative ICS. Start Qvar (beclomethasone) 40 mcg, 2 puffs twice daily. Patient counseled to start with 2 puffs once daily to determine tolerability.  - Continue albuterol HFA PRN, though discussed that since patient does not derive significant benefit from medication, to perhaps only use before engaging in physical activity  -Educated patient on purpose, proper use, potential adverse effects including risk  of esophageal candidiasis and need to rinse mouth/brush teeth after use  -Reviewed results of pulmonary function tests.  Pt verbalized understanding of results and education.    Tobacco use disorder with moderate nicotine dependence of 30+ years duration in a patient who is good candidate for success because of motivation to quit.  - Reduced bupropion 150 mg SR to once daily dosing to prevent worsening sleep problems. Encouraged patient to pick this prescription up and start  taking to prepare to quit.  - Encouraged patient to set a quit date this month, potentially 1-2 weeks after starting bupropion. Encouraged to place nicotine 14 mg patches once daily in the morning, but remove at bedtime to prevent worsening of nightmares that patient already reports.  - Encouraged patient to cut 4 mg nicotine gum in half and use for breakthrough cravings. Recommended that she could use at least as many doses of gum as cigarettes she is currently smoking (6-7 per day).  - Offered Quit Line support, but patient noted that her aunt is an excellent support system.   Medication Management - Patient deriving migraine prevention benefit from propranolol, though mildly bradycardic at visit today, which could be contributing to fatigue. Reduced propranolol LA from 80 mg daily to 60 mg daily. Counseled patient to increase back to 80 mg daily if recurrence of migraines.     Written pt instructions provided.  F/U phone call in 2 weeks  Total time in face to face counseling 90  minutes.  Patient seen with Evangeline Gula PharmD Candidate and Catie Darnelle Maffucci, PharmD, PGY2 Pharmacy Resident

## 2019-05-20 NOTE — Assessment & Plan Note (Signed)
Patient has been experiencing worse shortness of breath, coughing for 8 months and taking Anoro for the past 1.5 months.  Spirometry evaluation reveals restrictive lung disease, post nebulized albuterol tx revealed no reversibility. [facility] based on CAT score and exacerbations in the last year. - Continue Anoro. Due to some restriction, will start ICS. Patient had thrush with fluticasone, so will try alternative ICS. Start Qvar (beclomethasone) 40 mcg, 2 puffs twice daily. Patient counseled to start with 2 puffs once daily to determine tolerability.  - Continue albuterol HFA PRN, though discussed that since patient does not derive significant benefit from medication, to perhaps only use before engaging in physical activity  -Educated patient on purpose, proper use, potential adverse effects including risk of esophageal candidiasis and need to rinse mouth/brush teeth after use  -Reviewed results of pulmonary function tests.  Pt verbalized understanding of results and education.

## 2019-05-20 NOTE — Telephone Encounter (Signed)
Please advise on patient refill request for oxycodone.

## 2019-05-20 NOTE — Progress Notes (Signed)
Patient ID: Mariah Rice, female   DOB: [birth year], 48 y.o.   MRN: [id number] Reviewed: I agree with Dr. Graylin Shiver documentation and management.

## 2019-05-20 NOTE — Assessment & Plan Note (Signed)
    Tobacco use disorder with moderate nicotine dependence of 30+ years duration in a patient who is good candidate for success because of motivation to quit.  - Reduced bupropion 150 mg SR to once daily dosing to prevent worsening sleep problems. Encouraged patient to pick this prescription up and start taking to prepare to quit.  - Encouraged patient to set a quit date this month, potentially 1-2 weeks after starting bupropion. Encouraged to place nicotine 14 mg patches once daily in the morning, but remove at bedtime to prevent worsening of nightmares that patient already reports.  - Encouraged patient to cut 4 mg nicotine gum in half and use for breakthrough cravings. Recommended that she could use at least as many doses of gum as cigarettes she is currently smoking (6-7 per day).  - Offered Quit Line support, but patient noted that her aunt is an excellent support system.

## 2019-05-20 NOTE — Assessment & Plan Note (Signed)
Understands that her continued tobacco use is contributing largely to her continued shortness of breath.  She is willing to start Wellbutrin today.  We will start Wellbutrin 150 mg once daily for the first 3 days then transition to twice daily dosing.  Patient is to set her quit date for 2 weeks after starting this medication.  We will follow-up about this in 2 weeks.

## 2019-05-20 NOTE — Patient Instructions (Addendum)
It was great to meet you today!   Your lung function test showed some restrictive disease. That means that you may benefit from an inhaled steroid. We will add a different type of inhaled steroid called Qvar (beclomethasone) 40 mcg. The prescription says 2 puffs twice daily; you can try 1 puff twice daily for about a week first, to see if you have concerns with thrush. Try taking this medication right before brushing your teeth. Call us if you have any problems or concerns. Continue Anoro and continue the albuterol, but probably just use before getting reading to go out, walking, etc.   We are also going to change the bupropion dose to once daily, to make sure it doesn't make your sleep worse. Pick this up in the next week to start taking it. Together, we have set a quit date of 2 weeks from now to start the nicotine patches again and to quit smoking. Place a new patch every morning, and feel free to take it off when you go to bed to prevent it from worsening nightmares. Once you quit, you can use the nicotine gum as well, instead of cigarettes. You can cut your 4 mg pieces of gum in half. You can use as many doses of gum as the number of cigarettes you are replacing.   We are also going to decrease your propranolol to 60 mg daily, since your heart rate was so low.    We are providing you with a coupon for Qvar. Take these to the pharmacy to use in combination with your insurance, in case your copay is not $0.   We'll call you in 2 weeks to see how your breathing is doing and check on your plans to quit smoking. Schedule follow up with Dr. Shan Levans in 1-2 months.

## 2019-05-21 ENCOUNTER — Telehealth: Payer: Self-pay | Admitting: Family Medicine

## 2019-05-21 NOTE — Telephone Encounter (Signed)
Pt would like to have a copy of her most recent office visit and results sent to [employer] [facility]. She is having issues with her short term disability. She does not have the best fax number.

## 2019-05-24 ENCOUNTER — Ambulatory Visit: Payer: Self-pay | Admitting: General Surgery

## 2019-05-24 ENCOUNTER — Ambulatory Visit (HOSPITAL_COMMUNITY)
Admission: RE | Admit: 2019-05-24 | Discharge: 2019-05-24 | Disposition: A | Discharge status: Home / Self Care | Payer: BC Managed Care – PPO | Source: Ambulatory Visit | Attending: Family Medicine | Admitting: Family Medicine

## 2019-05-24 ENCOUNTER — Other Ambulatory Visit: Payer: Self-pay | Admitting: General Surgery

## 2019-05-24 ENCOUNTER — Other Ambulatory Visit (HOSPITAL_COMMUNITY): Payer: Self-pay | Admitting: General Surgery

## 2019-05-24 ENCOUNTER — Other Ambulatory Visit: Payer: Self-pay

## 2019-05-24 DIAGNOSIS — K56600 Partial intestinal obstruction, unspecified as to cause: Secondary | ICD-10-CM

## 2019-05-24 DIAGNOSIS — I10 Essential (primary) hypertension: Secondary | ICD-10-CM | POA: Insufficient documentation

## 2019-05-24 DIAGNOSIS — F17210 Nicotine dependence, cigarettes, uncomplicated: Secondary | ICD-10-CM | POA: Diagnosis not present

## 2019-05-24 DIAGNOSIS — R0600 Dyspnea, unspecified: Secondary | ICD-10-CM | POA: Diagnosis present

## 2019-05-24 DIAGNOSIS — R0602 Shortness of breath: Secondary | ICD-10-CM | POA: Diagnosis not present

## 2019-05-24 NOTE — H&P (Signed)
History of Present Illness Mariah Ok MD; 05/24/2019 3:02 PM) The patient is a 48 year old female who presents with abdominal pain. Patient is a 48 year old female who is referred by Dr. Loletha Carrow Chief complaint abdominal pain  Patient is a 48 year old female with a history of hypertension, obesity, anxiety, GERD, who comes in with a 10 month history of abdominal pain. Patient states that she noticed that she began having umbilical abdominal pain usually after eating fatty meals. Patient has undergone endoscopy as well as small bowel enteroscopy to evaluate her stomach as well as proximal jejunum. Patient had a CT scan which revealed thickened loop of bowel and partial small bowel obstruction. Patient did have contrast on CT scan which did make it to the colon. I did review this personally. Small bowel enteroscopy did not reveal any significant findings.  Patient states that the abdominal pain associated with nausea vomiting. She feels that this is sometimes undigested food. She feels that the pain is constant and stabbing more so after eating and prior to eating.  Patient previously underwent HIDA scan as well as ultrasound which had no findings of gallbladder origin. Patient has had a previous laparoscopic hysterectomy. She's had no previous other abdominal surgeries.     Allergies Alean Rinne, [place]; 05/24/2019 2:44 PM) Sulfamethoxazole *SULFONAMIDES*  Nausea, Rash. Penicillin V *PENICILLINS*  Rash, Nausea. Allergies Reconciled   Medication History Alean Rinne, [place]; 05/24/2019 2:45 PM) Hydrocodone-Acetaminophen (5-325MG  Tablet, Oral) Active. Citalopram Hydrobromide (40MG  Tablet, Oral) Active. Diclofenac Sodium (50MG  Tablet DR, Oral) Active. Ibuprofen (600MG  Tablet, Oral) Active. Hydrochlorothiazide (25MG  Tablet, Oral) Active. Fluticasone Propionate (50MCG/ACT Suspension, Nasal) Active. Peridex (0.12% Solution, Mouth/Throat) Active. buPROPion HCl ER (SR) (150MG   Tablet ER 12HR, Oral) Active. Ondansetron HCl (4MG  Tablet, Oral) Active. Prazosin HCl (2MG  Capsule, Oral) Active. Propranolol HCl ER (60MG  Capsule ER 24HR, Oral) Active. Medications Reconciled  Vitals Alean Rinne RMA; 05/24/2019 2:44 PM) 05/24/2019 2:43 PM Weight: 205.4 lb Height: 64in Body Surface Area: 1.98 m Body Mass Index: 35.26 kg/m  Temp.: 41F  Pulse: 98 (Regular)  BP: 130/72 (Sitting, Left Arm, Standard)       Physical Exam Mariah Ok MD; 05/24/2019 3:02 PM) The physical exam findings are as follows: Note:Constitutional: No acute distress, conversant, appears stated age  Eyes: Anicteric sclerae, moist conjunctiva, no lid lag  Neck: No thyromegaly, trachea midline, no cervical lymphadenopathy  Lungs: Clear to auscultation biilaterally, normal respiratory effot  Cardiovascular: regular rate & rhythm, no murmurs, no peripheal edema, pedal pulses 2+  GI: Soft, no masses or hepatosplenomegaly, non-tender to palpation  MSK: Normal gait, no clubbing cyanosis, edema  Skin: No rashes, palpation reveals normal skin turgor  Psychiatric: Appropriate judgment and insight, oriented to person, place, and time    Assessment & Plan Mariah Ok MD; 05/24/2019 3:04 PM) PARTIAL SMALL BOWEL OBSTRUCTION (K56.600) Impression: Patient is a 48 year old female with a history of hypertension, generalized anxiety disorder, obesity, tobacco use, with possible partial small bowel obstruction secondary to adhesions. Patient also with thickened proximal jejunum.  This on the patient has undergone several radiographic as well as endoscopies without any significant findings. Patient continues with similar symptoms of nausea vomiting, decreased tolerance to by mouth.  At this time we will proceed to the ER time for diagnostic laparoscopy, possible lysis of adhesions, possible small bowel resection based on findings. I discussed with the patient the risks and benefits of  the procedure to include but not limited to: Infection, bleeding, damage to structures, possible need for further surgery. Patient  voiced understanding and wishes to proceed.  We'll patient surgeries been scheduled we will obtain a gastric emptying study to evaluate her gastric emptying.

## 2019-05-24 NOTE — Progress Notes (Signed)
  Echocardiogram 2D Echocardiogram has been performed.  Mariah Rice 05/24/2019, 1:53 PM

## 2019-05-25 NOTE — Telephone Encounter (Signed)
I'm covering for Dr. Shan Levans, unsure what patient needs for disability. It appears the last visit was from Dr. Valentina Lucks with spirometry that is the last visit with results.

## 2019-05-25 NOTE — Telephone Encounter (Addendum)
Contacted pt to clarify what she is needing and she is needing the office visit note and results from her visit with Dr. Valentina Lucks. She would like these sent to [employer] @ [phone]. Routing to Dr. Valentina Lucks to gather this information. Rolanda Zimmerman Rumple, CMA

## 2019-05-25 NOTE — Telephone Encounter (Signed)
LVM for pt to call office back to ask if she was referencing her visit with Dr. Valentina Lucks in the last message.  Also wanted to see if she needs this before her appointment on 6/16 with Dr. Shan Levans.  Paul Zimmerman Rumple, CMA

## 2019-05-26 NOTE — Telephone Encounter (Signed)
Faxed notes and results from test on visit with Dr. Valentina Lucks to the [facility] to the fax number listed below. Kahli Zimmerman Rumple, CMA

## 2019-06-01 ENCOUNTER — Ambulatory Visit (INDEPENDENT_AMBULATORY_CARE_PROVIDER_SITE_OTHER): Payer: Self-pay | Admitting: Family Medicine

## 2019-06-01 ENCOUNTER — Other Ambulatory Visit: Payer: Self-pay

## 2019-06-01 VITALS — BP 110/68 | HR 63

## 2019-06-01 DIAGNOSIS — R0602 Shortness of breath: Secondary | ICD-10-CM

## 2019-06-01 DIAGNOSIS — Z720 Tobacco use: Secondary | ICD-10-CM

## 2019-06-01 NOTE — Patient Instructions (Signed)
It was nice seeing you today Ms. Mathison!  Please continue taking the medications that you have been taking.  Wellbutrin is $15 at [facility] for a 1 month supply insurance, so if you can afford this, please buy this medication so that you can have the best chance that quitting smoking.  This is still 1 of the most important things you can do for your breathing and your health overall.  Please also work on getting more exercise and eating more fruits and vegetables to lower your weight and help with your restrictive lung disease.  I am giving you a work note to have you return to work on 7/13.  Please let me know if you have any difficulties with this.  I hope that you feel better soon and that everything works out with your job.  I would like to see you back in about 2 months or sooner if needed.  If you have any questions or concerns, please feel free to call the clinic.   Be well,  Dr. Shan Levans

## 2019-06-01 NOTE — Progress Notes (Signed)
Subjective:    Mariah Rice - 48 y.o. female MRN [id number]  Date of birth: 1971/06/03  CC:  Gracynn M Gundlach is here for follow-up of her lung disease.  HPI: Ms. Fowers reports that her lung disease is stable, but she is no longer getting any insurance from her job and no has not been able to obtain disability.  She would like to be able to go back to work so that she can afford her bills and her medications.  However, she has further work-up for a possible small intestine obstruction later this month, so she would like to return to work after this is completed.  She recently started smoking a little more, now 8 to 9 cigarettes/day.  She did not take the Wellbutrin due to her concern about cost, and the Qvar prescription did not go through that to the pharmacy and would have been $500 without insurance anyway, so she is not taking this either.  She does continue to take Anoro once daily and albuterol as needed.  She recently got an echo to assess for possible heart disease in the setting of her shortness of breath, and this was normal.  Health Maintenance:   There are no preventive care reminders to display for this patient.  -  reports that she has been smoking cigarettes. She started smoking about 32 years ago. She has a 15.00 pack-year smoking history. She has never used smokeless tobacco. - Review of Systems: Per HPI. - Past Medical History: Patient Active Problem List   Diagnosis Date Noted  . Nausea and vomiting in adult   . Small bowel obstruction ([place])   . Periumbilical abdominal pain   . Abnormal finding on GI tract imaging   . Hyponatremia 05/03/2019  . Genetic testing 03/08/2019  . Shortness of breath 02/23/2019  . Viral respiratory illness 02/15/2019  . COPD suggested by initial evaluation ([place]) 02/15/2019  . Family history of breast cancer 01/26/2019  . Constipation 01/26/2019  . Vomiting and diarrhea 01/26/2019  . Unintentional weight loss 12/22/2018  . Intermittent  vomiting 12/22/2018  . Edema 08/19/2018  . Low back pain potentially associated with radiculopathy 06/06/2018  . Prediabetes 05/31/2018  . Tinea pedis of both feet 05/31/2018  . Muscle strain of left gluteal region 03/20/2018  . Migraine 03/20/2018  . Left hip pain 02/11/2018  . Migraine without aura and with status migrainosus, not intractable 11/30/2017  . Diarrhea of presumed infectious origin 09/19/2017  . Skin tag 08/05/2017  . Tick bite 05/13/2017  . Numbness and tingling in both hands 01/29/2017  . Rosacea 10/14/2016  . Mood disorder ([place]) 10/06/2016  . Tingling sensation 08/28/2016  . Rash and nonspecific skin eruption 12/27/2015  . Pain in finger of left hand 12/27/2015  . GERD (gastroesophageal reflux disease) 10/19/2015  . H/O bilateral oophorectomy 08/13/2015  . S/P laparoscopic assisted vaginal hysterectomy (LAVH) 07/11/2015  . Family history of ovarian cancer 05/04/2015  . Back pain 03/15/2015  . Dermoid cyst of arm 12/19/2014  . Environmental allergies 09/30/2013  . Depression 01/06/2013  . GAD (generalized anxiety disorder) 01/06/2013  . Headache 11/24/2012  . Tobacco abuse 11/10/2012  . Overweight 11/10/2012  . 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  Since patient cannot currently afford Qvar, will continue on Anoro since she already has this medication.  She is also obtained Spiriva from a friend, which she is using as well.  I am amenable to prescribing  this for her when she is able to restart her insurance.  I am writing a work release note for her to return to work in 1 month at her request so that she can be able to afford her bills and medications.  She has some work-up for a possible small bowel obstruction between now and then, so she would like to set her return to work date for 7/13.  Echo results were reviewed with patient, and she was reassured that these were normal and therefore not contributing to her shortness of breath.  Tobacco abuse Patient understands that cessation of tobacco is probably the most important factor in improving her lung function.  She was told that Wellbutrin is actually very affordable even without insurance at $15 for 30-day supply, and I encouraged her to continue taking this medication and continue using nicotine patches and gum to facilitate her attempt to quit.    Maia Breslow, M.D. 06/03/2019, 10:03 AM PGY-2, [facility]

## 2019-06-03 ENCOUNTER — Encounter: Payer: Self-pay | Admitting: Family Medicine

## 2019-06-03 ENCOUNTER — Other Ambulatory Visit: Payer: Self-pay | Admitting: Family Medicine

## 2019-06-03 DIAGNOSIS — I10 Essential (primary) hypertension: Secondary | ICD-10-CM

## 2019-06-03 NOTE — Assessment & Plan Note (Addendum)
Appears to have elements of both obstructive and restrictive disease.  Since patient cannot currently afford Qvar, will continue on Anoro since she already has this medication.  She is also obtained Spiriva from a friend, which she is using as well.  I am amenable to prescribing this for her when she is able to restart her insurance.  I am writing a work release note for her to return to work in 1 month at her request so that she can be able to afford her bills and medications.  She has some work-up for a possible small bowel obstruction between now and then, so she would like to set her return to work date for 7/13.  Echo results were reviewed with patient, and she was reassured that these were normal and therefore not contributing to her shortness of breath.

## 2019-06-03 NOTE — Assessment & Plan Note (Signed)
Patient understands that cessation of tobacco is probably the most important factor in improving her lung function.  She was told that Wellbutrin is actually very affordable even without insurance at $15 for 30-day supply, and I encouraged her to continue taking this medication and continue using nicotine patches and gum to facilitate her attempt to quit.

## 2019-06-04 ENCOUNTER — Encounter (HOSPITAL_COMMUNITY): Payer: Self-pay

## 2019-06-04 ENCOUNTER — Encounter (HOSPITAL_COMMUNITY): Admission: RE | Admit: 2019-06-04 | Payer: BC Managed Care – PPO | Source: Ambulatory Visit

## 2019-06-06 ENCOUNTER — Other Ambulatory Visit: Payer: Self-pay | Admitting: Internal Medicine

## 2019-06-10 ENCOUNTER — Telehealth: Payer: Self-pay

## 2019-06-10 NOTE — Telephone Encounter (Signed)
Cathy Wall from [place] is calling to clarify note received from Dr Shan Levans. Pt was written out of work because of COPD and has to use inhalers and must be able to stay 6 ft away from people. Then a letter was received saying that Lovelee can return to work on July 17th with no restrictions. Tye Maryland is confused and needs to clarify what is going on with the patient. Tye Maryland is asking for Dr. Shan Levans to give her a call @ [phone] or her cell phone [phone]. Ottis Stain, CMA

## 2019-06-11 NOTE — Telephone Encounter (Signed)
Left voicemail on both of Ms. Hackett numbers regarding the work notes for Ms. Mel Almond.  I told her that initially, the goal was to move Ms. Thackston to a better work environment that would keep her away from the fumes and dust that were exacerbating her shortness of breath.  However, it did not seem that this position was available to her, and instead she was not getting paid and could no longer afford her medications.  Due to this, she elected to return back to work, and I gave her that approval so that she could afford her medications and pay her bills.  I asked her to please call our clinic number back if she continues to have questions about this.  Dajana Gehrig C. Shan Levans, MD PGY-2, [facility] 06/11/2019 2:33 PM

## 2019-06-11 NOTE — Telephone Encounter (Signed)
Left voicemail on both of Ms. Mariah Rice numbers regarding the work notes for Ms. Mariah Rice.  I told her that initially, the goal was to move Ms. Mariah Rice to a better work environment that would keep her away from the fumes and dust that were exacerbating her shortness of breath.  However, it did not seem that this position was available to her, and instead she was not getting paid and could no longer afford her medications.  Due to this, she elected to return back to work, and I gave her that approval so that she could afford her medications and pay her bills.  I asked her to please call our clinic number back if she continues to have questions about this.

## 2019-06-15 ENCOUNTER — Telehealth: Payer: Self-pay | Admitting: Family Medicine

## 2019-06-15 NOTE — Telephone Encounter (Signed)
Called [name] to let her know that I got another disability request form from [employer] and I wanted to check with her to see what she would like for me to do with it.  We agreed that, since she has not been getting paid because of being out of her job, she does not want to pursue disability anymore and wants to return to work.  I told her that I had spoken with her company's nurse and told her that, since they have not been able to find a physician for her where she can work in an environment without dust and fumes that exacerbate her COPD, she would rather work than stay home and not be able to afford her bills.  I confirmed with [name] that this is the plan and that she does not want me to fill out this new disability form.  Regino Fournet C. Shan Levans, MD PGY-2, [facility] 06/15/2019 5:23 PM

## 2019-06-22 ENCOUNTER — Encounter: Payer: Self-pay | Admitting: Family Medicine

## 2019-06-28 ENCOUNTER — Other Ambulatory Visit: Payer: Self-pay | Admitting: Family Medicine

## 2019-06-28 ENCOUNTER — Telehealth: Payer: Self-pay | Admitting: Family Medicine

## 2019-06-28 DIAGNOSIS — B9789 Other viral agents as the cause of diseases classified elsewhere: Secondary | ICD-10-CM

## 2019-06-28 DIAGNOSIS — I10 Essential (primary) hypertension: Secondary | ICD-10-CM

## 2019-06-28 DIAGNOSIS — Z720 Tobacco use: Secondary | ICD-10-CM

## 2019-06-28 MED ORDER — BUSPIRONE HCL 15 MG PO TABS: 15.0000 mg | ORAL_TABLET | Freq: Three times a day (TID) | ORAL | 3 refills | Status: DC

## 2019-06-28 MED ORDER — BUPROPION HCL ER (SR) 150 MG PO TB12: 150.0000 mg | ORAL_TABLET | Freq: Every day | ORAL | 2 refills | Status: DC

## 2019-06-28 MED ORDER — ALBUTEROL SULFATE HFA 108 (90 BASE) MCG/ACT IN AERS: 2.0000 | INHALATION_SPRAY | Freq: Four times a day (QID) | RESPIRATORY_TRACT | 3 refills | Status: DC | PRN

## 2019-06-28 MED ORDER — OMEPRAZOLE 20 MG PO CPDR: 20.0000 mg | DELAYED_RELEASE_CAPSULE | Freq: Every day | ORAL | 3 refills | Status: DC

## 2019-06-28 MED ORDER — PROPRANOLOL HCL ER 60 MG PO CP24: 60.0000 mg | ORAL_CAPSULE | Freq: Every day | ORAL | 2 refills | Status: DC

## 2019-06-28 MED ORDER — PRAZOSIN HCL 2 MG PO CAPS: 2.0000 mg | ORAL_CAPSULE | Freq: Every day | ORAL | 3 refills | Status: DC

## 2019-06-28 MED ORDER — PRAVASTATIN SODIUM 40 MG PO TABS: 40.0000 mg | ORAL_TABLET | Freq: Every day | ORAL | 11 refills | Status: DC

## 2019-06-28 MED ORDER — AMLODIPINE BESYLATE 5 MG PO TABS: 5.0000 mg | ORAL_TABLET | Freq: Every day | ORAL | 2 refills | Status: DC

## 2019-06-28 NOTE — Telephone Encounter (Signed)
Spoke to [name] and she said pt should come in a pick up application for orange card and FA.  She said once everything was complete she would send the information off to see if she is eligible.  Pt will be by tomorrow to pick up this information. [name], CMA

## 2019-06-28 NOTE — Telephone Encounter (Signed)
Spoke with patient regarding her recent visit with her company's doctor.  She says that her company's doctor agreed with me and that she should not be working in her current environments that exposes her to fumes and dust that exacerbate her COPD and restrictive lung disease.  However, her job at [employer] provide her with a different position, so she has stopped working for that company.  She is preparing to start her new job at [employer].  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  She needs refills of all of her medications, but she cannot afford her maintenance inhalers anymore.  She is interested in applying for financial assistance and/or the orange card if this is appropriate.  I told her that I will refill her medications and get in touch with our staff to see which assistance programs would be most helpful for her.  Mariah Province C. Shan Levans, MD PGY-3, [facility] 06/28/2019 9:57 AM

## 2019-08-02 ENCOUNTER — Encounter: Payer: Self-pay | Admitting: Family Medicine

## 2019-08-02 ENCOUNTER — Ambulatory Visit (INDEPENDENT_AMBULATORY_CARE_PROVIDER_SITE_OTHER): Payer: Self-pay | Admitting: Family Medicine

## 2019-08-02 ENCOUNTER — Other Ambulatory Visit: Payer: Self-pay

## 2019-08-02 DIAGNOSIS — B9789 Other viral agents as the cause of diseases classified elsewhere: Secondary | ICD-10-CM

## 2019-08-02 DIAGNOSIS — I10 Essential (primary) hypertension: Secondary | ICD-10-CM

## 2019-08-02 DIAGNOSIS — M5442 Lumbago with sciatica, left side: Secondary | ICD-10-CM

## 2019-08-02 DIAGNOSIS — J988 Other specified respiratory disorders: Secondary | ICD-10-CM

## 2019-08-02 DIAGNOSIS — Z720 Tobacco use: Secondary | ICD-10-CM

## 2019-08-02 MED ORDER — PRAVASTATIN SODIUM 40 MG PO TABS: 40.0000 mg | ORAL_TABLET | Freq: Every day | ORAL | 11 refills | Status: DC

## 2019-08-02 MED ORDER — BUPROPION HCL ER (SR) 150 MG PO TB12: 150.0000 mg | ORAL_TABLET | Freq: Every day | ORAL | 2 refills | Status: DC

## 2019-08-02 MED ORDER — IBUPROFEN 800 MG PO TABS: 800.0000 mg | ORAL_TABLET | Freq: Three times a day (TID) | ORAL | 0 refills | Status: DC | PRN

## 2019-08-02 MED ORDER — AMLODIPINE BESYLATE 5 MG PO TABS: 5.0000 mg | ORAL_TABLET | Freq: Every day | ORAL | 2 refills | Status: DC

## 2019-08-02 MED ORDER — ALBUTEROL SULFATE HFA 108 (90 BASE) MCG/ACT IN AERS: 2.0000 | INHALATION_SPRAY | Freq: Four times a day (QID) | RESPIRATORY_TRACT | 3 refills | Status: DC | PRN

## 2019-08-02 MED ORDER — PROPRANOLOL HCL ER 60 MG PO CP24: 60.0000 mg | ORAL_CAPSULE | Freq: Every day | ORAL | 2 refills | Status: DC

## 2019-08-02 MED ORDER — OMEPRAZOLE 20 MG PO CPDR: 20.0000 mg | DELAYED_RELEASE_CAPSULE | Freq: Every day | ORAL | 3 refills | Status: DC

## 2019-08-02 MED ORDER — ESTRADIOL 2 MG PO TABS: 2.0000 mg | ORAL_TABLET | Freq: Every day | ORAL | 11 refills | Status: DC

## 2019-08-02 NOTE — Progress Notes (Signed)
Subjective:    Mariah Rice - 47 y.o. female MRN [id number]  Date of birth: Sep 09, 1971  CC:  Mariah Rice is here for back pain and tobacco cessation.  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  She is tolerating this job well.  Tobacco cessation Continues to smoke about 10 cigarettes/day Has nicotine patches and gum at home, but she says that these are minimally effective for her Although we discussed this at her last visit, she did not realize that Wellbutrin is affordable, so she has not been taking it Continues to have shortness of breath with activity, but this is no worse than her normal level of S OB Uses albuterol intermittently and is using a friend's Spiriva since Spiriva is too expensive for her   Health Maintenance:  Health Maintenance Due  Topic Date Due  . INFLUENZA VACCINE  07/17/2019    -  reports that she has been smoking cigarettes. She started smoking about 32 years ago. She has a 15.00 pack-year smoking history. She has never used smokeless tobacco. - Review of Systems: Per HPI. - Past Medical History: Patient Active Problem List   Diagnosis Date Noted  . Nausea and vomiting in adult   . Small bowel obstruction ([place])   . Periumbilical abdominal pain   . Abnormal finding on GI tract imaging   . Hyponatremia 05/03/2019  . Genetic testing 03/08/2019  . Shortness of breath 02/23/2019  . Viral respiratory illness 02/15/2019  . COPD suggested by initial evaluation ([place]) 02/15/2019  . Family history of breast cancer 01/26/2019  . Constipation 01/26/2019  . Vomiting and diarrhea 01/26/2019  . Unintentional weight loss 12/22/2018  .  Intermittent vomiting 12/22/2018  . Edema 08/19/2018  . Low back pain potentially associated with radiculopathy 06/06/2018  . Prediabetes 05/31/2018  . Tinea pedis of both feet 05/31/2018  . Muscle strain of left gluteal region 03/20/2018  . Migraine 03/20/2018  . Left hip pain 02/11/2018  . Migraine without aura and with status migrainosus, not intractable 11/30/2017  . Diarrhea of presumed infectious origin 09/19/2017  . Skin tag 08/05/2017  . Tick bite 05/13/2017  . Numbness and tingling in both hands 01/29/2017  . Rosacea 10/14/2016  . Mood disorder ([place]) 10/06/2016  . Tingling sensation 08/28/2016  . Rash and nonspecific skin eruption 12/27/2015  . Pain in finger of left hand 12/27/2015  . GERD (gastroesophageal reflux disease) 10/19/2015  . H/O bilateral oophorectomy 08/13/2015  . S/P laparoscopic assisted vaginal hysterectomy (LAVH) 07/11/2015  . Family history of ovarian cancer 05/04/2015  . Back pain 03/15/2015  . Dermoid cyst of arm 12/19/2014  . Environmental allergies 09/30/2013  . Depression 01/06/2013  . GAD (generalized anxiety disorder) 01/06/2013  . Headache 11/24/2012  . Tobacco abuse 11/10/2012  . Overweight 11/10/2012  . Hypertension 11/10/2012   - Medications: reviewed and updated   Objective:   Physical Exam BP 130/74   Pulse 72   Wt 202 lb 6.4 oz (91.8 kg)   LMP  (LMP Unknown) Comment: on Megace  SpO2 96%   BMI 34.74 kg/m  Gen: NAD, alert, cooperative with exam, well-appearing CV: RRR, good S1/S2, no murmur Resp: Mild wheezing in  left upper lobe, otherwise clear to auscultation Musculoskeletal: Back: No visual deformity.  Some tenderness to palpation around the lumbosacral area with tenderness in the lumbosacral paraspinal areas bilaterally.  No point tenderness.  Restricted ROM on lumbar flexion, extension, and rotation bilaterally.  Straight leg test positive on the left side.        Assessment & Plan:   Back pain Acute on chronic.   X-ray from 2019 of the lumbar spine shows changes due to arthropathy including disc space narrowing and anterior slip of L4, which correspond with the areas that cause her pain.  Likely has some element of sciatica as well.  Counseled patient that these are chronic changes, and the best treatment includes physical therapy and Tylenol with NSAIDs for breakthrough pain.  We agreed that she will complete back exercises and stretches at home using a handout I provided as well as videos on YouTube and will use Tylenol for her baseline pain with ibuprofen 800 mg 3 times daily only as needed.  Patient was counseled to not use NSAIDs every day.  Her creatinine level is normal, so she can tolerate NSAIDs for breakthrough pain.  Tobacco abuse Continues to go back and forth between the contemplative and action stages.  Will send Wellbutrin again to her pharmacy and encouraged her to try this.  Also encouraged her to make an appointment with her tobacco cessation clinic to see if they have other recommendations for her, especially since she is limited financially due to her current lack of medical insurance.    Maia Breslow, M.D. 08/03/2019, 8:01 AM PGY-3, [place]

## 2019-08-02 NOTE — Patient Instructions (Addendum)
It was nice seeing you today Mariah Rice!  For your back pain, I am sending you a handout of back exercises and stretches that will help increase your range of motion and relieve your back pain.  You can also look up yoga or stretches for back pain on YouTube and find great videos there.  Please take Tylenol for your baseline pain and add ibuprofen 800 mg up to 3 times per day for breakthrough pain.  Please do not take ibuprofen every day, and please take this with some food on your stomach.  You can take up to 4 g of Tylenol per day.  Please make an appointment with our tobacco cessation clinic to see if they have any other ideas for helping you to quit smoking.  Please continue Spiriva daily and albuterol as needed.  I have sent in your medications to your pharmacy today.    If you have any questions or concerns, please feel free to call the clinic.   Be well,  Dr. Shan Levans  Back Exercises The following exercises strengthen the muscles that help to support the trunk and back. They also help to keep the lower back flexible. Doing these exercises can help to prevent back pain or lessen existing pain.  If you have back pain or discomfort, try doing these exercises 2-3 times each day or as told by your health care provider.  As your pain improves, do them once each day, but increase the number of times that you repeat the steps for each exercise (do more repetitions).  To prevent the recurrence of back pain, continue to do these exercises once each day or as told by your health care provider. Do exercises exactly as told by your health care provider and adjust them as directed. It is normal to feel mild stretching, pulling, tightness, or discomfort as you do these exercises, but you should stop right away if you feel sudden pain or your pain gets worse. Exercises Single knee to chest Repeat these steps 3-5 times for each leg: 1. Lie on your back on a firm bed or the floor with your legs  extended. 2. Bring one knee to your chest. Your other leg should stay extended and in contact with the floor. 3. Hold your knee in place by grabbing your knee or thigh with both hands and hold. 4. Pull on your knee until you feel a gentle stretch in your lower back or buttocks. 5. Hold the stretch for 10-30 seconds. 6. Slowly release and straighten your leg. Pelvic tilt Repeat these steps 5-10 times: 1. Lie on your back on a firm bed or the floor with your legs extended. 2. Bend your knees so they are pointing toward the ceiling and your feet are flat on the floor. 3. Tighten your lower abdominal muscles to press your lower back against the floor. This motion will tilt your pelvis so your tailbone points up toward the ceiling instead of pointing to your feet or the floor. 4. With gentle tension and even breathing, hold this position for 5-10 seconds. Cat-cow Repeat these steps until your lower back becomes more flexible: 1. Get into a hands-and-knees position on a firm surface. Keep your hands under your shoulders, and keep your knees under your hips. You may place padding under your knees for comfort. 2. Let your head hang down toward your chest. Contract your abdominal muscles and point your tailbone toward the floor so your lower back becomes rounded like the back of a  cat. 3. Hold this position for 5 seconds. 4. Slowly lift your head, let your abdominal muscles relax and point your tailbone up toward the ceiling so your back forms a sagging arch like the back of a cow. 5. Hold this position for 5 seconds.  Press-ups Repeat these steps 5-10 times: 1. Lie on your abdomen (face-down) on the floor. 2. Place your palms near your head, about shoulder-width apart. 3. Keeping your back as relaxed as possible and keeping your hips on the floor, slowly straighten your arms to raise the top half of your body and lift your shoulders. Do not use your back muscles to raise your upper torso. You may  adjust the placement of your hands to make yourself more comfortable. 4. Hold this position for 5 seconds while you keep your back relaxed. 5. Slowly return to lying flat on the floor.  Bridges Repeat these steps 10 times: 1. Lie on your back on a firm surface. 2. Bend your knees so they are pointing toward the ceiling and your feet are flat on the floor. Your arms should be flat at your sides, next to your body. 3. Tighten your buttocks muscles and lift your buttocks off the floor until your waist is at almost the same height as your knees. You should feel the muscles working in your buttocks and the back of your thighs. If you do not feel these muscles, slide your feet 1-2 inches farther away from your buttocks. 4. Hold this position for 3-5 seconds. 5. Slowly lower your hips to the starting position, and allow your buttocks muscles to relax completely. If this exercise is too easy, try doing it with your arms crossed over your chest. Abdominal crunches Repeat these steps 5-10 times: 1. Lie on your back on a firm bed or the floor with your legs extended. 2. Bend your knees so they are pointing toward the ceiling and your feet are flat on the floor. 3. Cross your arms over your chest. 4. Tip your chin slightly toward your chest without bending your neck. 5. Tighten your abdominal muscles and slowly raise your trunk (torso) high enough to lift your shoulder blades a tiny bit off the floor. Avoid raising your torso higher than that because it can put too much stress on your low back and does not help to strengthen your abdominal muscles. 6. Slowly return to your starting position. Back lifts Repeat these steps 5-10 times: 1. Lie on your abdomen (face-down) with your arms at your sides, and rest your forehead on the floor. 2. Tighten the muscles in your legs and your buttocks. 3. Slowly lift your chest off the floor while you keep your hips pressed to the floor. Keep the back of your head in  line with the curve in your back. Your eyes should be looking at the floor. 4. Hold this position for 3-5 seconds. 5. Slowly return to your starting position. Contact a health care provider if:  Your back pain or discomfort gets much worse when you do an exercise.  Your worsening back pain or discomfort does not lessen within 2 hours after you exercise. If you have any of these problems, stop doing these exercises right away. Do not do them again unless your health care provider says that you can. Get help right away if:  You develop sudden, severe back pain. If this happens, stop doing the exercises right away. Do not do them again unless your health care provider says that you can.  This information is not intended to replace advice given to you by your health care provider. Make sure you discuss any questions you have with your health care provider. Document Released: 01/09/2005 Document Revised: 04/08/2019 Document Reviewed: 09/03/2018 Elsevier Patient Education  2020 [employer].

## 2019-08-03 NOTE — Assessment & Plan Note (Signed)
Acute on chronic.  X-ray from 2019 of the lumbar spine shows changes due to arthropathy including disc space narrowing and anterior slip of L4, which correspond with the areas that cause her pain.  Likely has some element of sciatica as well.  Counseled patient that these are chronic changes, and the best treatment includes physical therapy and Tylenol with NSAIDs for breakthrough pain.  We agreed that she will complete back exercises and stretches at home using a handout I provided as well as videos on YouTube and will use Tylenol for her baseline pain with ibuprofen 800 mg 3 times daily only as needed.  Patient was counseled to not use NSAIDs every day.  Her creatinine level is normal, so she can tolerate NSAIDs for breakthrough pain.

## 2019-08-03 NOTE — Assessment & Plan Note (Addendum)
Continues to go back and forth between the contemplative and action stages.  Will send Wellbutrin again to her pharmacy and encouraged her to try this.  Also encouraged her to make an appointment with her tobacco cessation clinic to see if they have other recommendations for her, especially since she is limited financially due to her current lack of medical insurance.

## 2019-09-09 ENCOUNTER — Ambulatory Visit (INDEPENDENT_AMBULATORY_CARE_PROVIDER_SITE_OTHER): Payer: Self-pay

## 2019-09-09 ENCOUNTER — Other Ambulatory Visit: Payer: Self-pay

## 2019-09-09 DIAGNOSIS — Z23 Encounter for immunization: Secondary | ICD-10-CM

## 2019-09-24 ENCOUNTER — Ambulatory Visit (INDEPENDENT_AMBULATORY_CARE_PROVIDER_SITE_OTHER): Payer: Self-pay | Admitting: Family Medicine

## 2019-09-24 ENCOUNTER — Encounter: Payer: Self-pay | Admitting: Family Medicine

## 2019-09-24 ENCOUNTER — Other Ambulatory Visit: Payer: Self-pay

## 2019-09-24 DIAGNOSIS — J449 Chronic obstructive pulmonary disease, unspecified: Secondary | ICD-10-CM

## 2019-09-24 DIAGNOSIS — B9789 Other viral agents as the cause of diseases classified elsewhere: Secondary | ICD-10-CM

## 2019-09-24 DIAGNOSIS — J988 Other specified respiratory disorders: Secondary | ICD-10-CM

## 2019-09-24 DIAGNOSIS — I10 Essential (primary) hypertension: Secondary | ICD-10-CM

## 2019-09-24 DIAGNOSIS — Z720 Tobacco use: Secondary | ICD-10-CM

## 2019-09-24 MED ORDER — PROPRANOLOL HCL ER 60 MG PO CP24: 60.0000 mg | ORAL_CAPSULE | Freq: Every day | ORAL | 11 refills | Status: DC

## 2019-09-24 MED ORDER — BUPROPION HCL ER (SR) 150 MG PO TB12: 150.0000 mg | ORAL_TABLET | Freq: Every day | ORAL | 2 refills | Status: DC

## 2019-09-24 MED ORDER — AMLODIPINE BESYLATE 5 MG PO TABS: 5.0000 mg | ORAL_TABLET | Freq: Every day | ORAL | 11 refills | Status: DC

## 2019-09-24 MED ORDER — IBUPROFEN 800 MG PO TABS: 800.0000 mg | ORAL_TABLET | Freq: Three times a day (TID) | ORAL | 0 refills | Status: DC | PRN

## 2019-09-24 MED ORDER — PRAVASTATIN SODIUM 40 MG PO TABS: 40.0000 mg | ORAL_TABLET | Freq: Every day | ORAL | 11 refills | Status: DC

## 2019-09-24 MED ORDER — FLUTICASONE FUROATE-VILANTEROL 200-25 MCG/INH IN AEPB: 1.0000 | INHALATION_SPRAY | Freq: Every day | RESPIRATORY_TRACT | 0 refills | Status: DC

## 2019-09-24 MED ORDER — ALBUTEROL SULFATE HFA 108 (90 BASE) MCG/ACT IN AERS: 2.0000 | INHALATION_SPRAY | Freq: Four times a day (QID) | RESPIRATORY_TRACT | 3 refills | Status: DC | PRN

## 2019-09-24 NOTE — Progress Notes (Signed)
   Subjective:    Mariah Rice - 48 y.o. female MRN [id number]  Date of birth: [birth year]  CC:  Mariah Rice is here for follow up of COPD.  HPI: Notes that she has been feeling good recently and has lost a few pounds due to increased walking at work.  She does say that she has used her albuterol little bit more than normal, about 2-3 times per day recently due to shortness of breath.  However, her shortness of breath is not cause any dysfunction for her.  She is getting close to being out of her borrowed Spiriva.  She says that she quit smoking 4 days ago and is using a vape pen to help her.  She also thinks that the Wellbutrin has been helpful.  She is very proud of her progress with tobacco cessation.  Health Maintenance:  There are no preventive care reminders to display for this patient.  -  reports that she has been smoking cigarettes. She started smoking about 32 years ago. She has a 15.00 pack-year smoking history. She has never used smokeless tobacco. - Review of Systems: Per HPI. - Past Medical History: Patient Active Problem List   Diagnosis Date Noted  . Small bowel obstruction ([place])   . Abnormal finding on GI tract imaging   . Genetic testing 03/08/2019  . COPD suggested by initial evaluation ([place]) 02/15/2019  . Family history of breast cancer 01/26/2019  . Unintentional weight loss 12/22/2018  . Low back pain potentially associated with radiculopathy 06/06/2018  . Prediabetes 05/31/2018  . Migraine without aura and with status migrainosus, not intractable 11/30/2017  . Rosacea 10/14/2016  . Mood disorder ([place]) 10/06/2016  . GERD (gastroesophageal reflux disease) 10/19/2015  . H/O bilateral oophorectomy 08/13/2015  . S/P laparoscopic assisted vaginal hysterectomy (LAVH) 07/11/2015  . GAD (generalized anxiety disorder) 01/06/2013  . Tobacco abuse 11/10/2012  . Overweight 11/10/2012  . Hypertension 11/10/2012   - Medications: reviewed and updated   Objective:   Physical Exam BP 132/68   Pulse 66   Wt 199 lb 9.6 oz (90.5 kg)   LMP  (LMP Unknown) Comment: on Megace  SpO2 98%   BMI 34.26 kg/m  Gen: NAD, alert, cooperative with exam, well-appearing CV: RRR, good S1/S2, no murmur, no edema Resp: CTABL, no wheezes, non-labored Psych: good insight, alert and oriented, upbeat affect    Assessment & Plan:   COPD suggested by initial evaluation [facility]) Exam is reassuring today without any wheezing or shortness of breath on room air.  Since patient is almost out of Spiriva, we will provide a Breo sample to see how she responds to this.  Encouraged patient to look into applying for the orange card if her job does not offer her insurance.  Congratulated her on her smoking cessation and encouraged her to continue using the vape pen if she finds this helpful.  Refilled Wellbutrin.  Will follow up in 3 months or earlier if needed.    Maia Breslow, M.D. 09/24/2019, 2:29 PM PGY-3, [place]

## 2019-09-24 NOTE — Assessment & Plan Note (Addendum)
Exam is reassuring today without any wheezing or shortness of breath on room air.  Since patient is almost out of Spiriva, we will provide a Breo sample to see how she responds to this.  Encouraged patient to look into applying for the orange card if her job does not offer her insurance.  Congratulated her on her smoking cessation and encouraged her to continue using the vape pen if she finds this helpful.  Refilled Wellbutrin.  Will follow up in 3 months or earlier if needed.

## 2019-09-24 NOTE — Patient Instructions (Signed)
It was nice seeing you today Ms. Spangenberg!  Congratulations on quitting smoking!  I am very proud of your efforts.  We are giving you a sample of Brio today, which you should take once daily.  Please make sure to follow the instructions on the back of the box for how to use this.  I would like to see you back in 3 months or earlier as needed.  I have refilled your home medications.  Please consider applying for the orange card if your job does not offer you any insurance.  If you have any questions or concerns, please feel free to call the clinic.   Be well,  Dr. Shan Levans

## 2019-09-28 ENCOUNTER — Other Ambulatory Visit: Payer: Self-pay | Admitting: Family Medicine

## 2019-09-29 ENCOUNTER — Encounter: Payer: Self-pay | Admitting: Family Medicine

## 2019-09-30 ENCOUNTER — Encounter: Payer: Self-pay | Admitting: Family Medicine

## 2019-10-04 ENCOUNTER — Encounter: Payer: Self-pay | Admitting: Family Medicine

## 2019-10-25 ENCOUNTER — Other Ambulatory Visit: Payer: Self-pay

## 2019-10-25 ENCOUNTER — Ambulatory Visit (INDEPENDENT_AMBULATORY_CARE_PROVIDER_SITE_OTHER): Payer: Self-pay | Admitting: Family Medicine

## 2019-10-25 ENCOUNTER — Encounter: Payer: Self-pay | Admitting: Family Medicine

## 2019-10-25 VITALS — BP 112/64 | HR 67 | Wt 203.6 lb

## 2019-10-25 DIAGNOSIS — M25511 Pain in right shoulder: Secondary | ICD-10-CM

## 2019-10-25 MED ORDER — TIZANIDINE HCL 4 MG PO TABS: 4.0000 mg | ORAL_TABLET | Freq: Four times a day (QID) | ORAL | 0 refills | Status: DC | PRN

## 2019-10-25 NOTE — Progress Notes (Signed)
     Subjective: Chief Complaint  Patient presents with  . Shoulder Pain    right side    HPI: Mariah Rice is a 48 y.o. presenting to clinic today to discuss the following:  Right Shoulder Pain Patient reports about a week of right shoulder pain that is located in her back in the scapular region. She denies any trauma or inciting event. She recalls no "pop" or feeling any "snap". She does endorse her shoulder "grinds" some while moving it. She has tried Ibuprofen, Tylenol, ice/heat, and biofreeze with little improvement. She denies any swelling or bruising of the right shoulder area. She can move it but it is painful. The pain radiates to the side up under her arm along the 4th rib. She denies any chest pain, SOB, difficulty breathing, or cough. She does have COPD but it is not worse since her shoulder pain started.      ROS noted in HPI.    Social History   Tobacco Use  Smoking Status Current Every Day Smoker  . Packs/day: 0.50  . Years: 30.00  . Pack years: 15.00  . Types: Cigarettes  . Start date: 12/16/1986  Smokeless Tobacco Never Used  Tobacco Comment   Smoked 1 ppd for 25 years    Objective: BP 112/64   Pulse 67   Wt 203 lb 9.6 oz (92.4 kg)   LMP  (LMP Unknown) Comment: on Megace  SpO2 99%   BMI 34.95 kg/m  Vitals and nursing notes reviewed  Physical Exam Shoulder, Right: No evidence of bony deformity, asymmetry, or muscle atrophy; No tenderness over long head of biceps (bicipital groove). No TTP at [facility] joint. Limited active and passive range of motion, Thumb cannot reach T12 without significant tenderness. Strength 5/5 throughout. No abnormal scapular function observed. Sensation intact. Peripheral pulses intact.  Special Tests:   - Crossarm test: NEG   - Empty can: NEG   - Hawkins: NEG   - Neer test: NEG   - Obrien's test: NEG   - Apprehension test: NEG  Assessment/Plan:  Right shoulder pain Unclear etiology as most of her pain is scapular with  physical exam not suggestive of any rotator cuff or labral pathology. - Relative rest with home exercises given for scapular dyskinesia - Cont Ibuprofen/Tylenol with ice/heat - Zanaflex for suspected muscle spasms in the scapular muscles - If no improvement consider formal PT vs steroid injection or both at next appointment  PATIENT EDUCATION PROVIDED: See AVS    Diagnosis and plan along with any newly prescribed medication(s) were discussed in detail with this patient today. The patient verbalized understanding and agreed with the plan. Patient advised if symptoms worsen return to clinic or ER.   No orders of the defined types were placed in this encounter.   Meds ordered this encounter  Medications  . tiZANidine (ZANAFLEX) 4 MG tablet    Sig: Take 1 tablet (4 mg total) by mouth every 6 (six) hours as needed for muscle spasms.    Dispense:  30 tablet    Refill:  0     Harolyn Rutherford, DO 10/25/2019, 2:25 PM PGY-3 [place]

## 2019-10-25 NOTE — Patient Instructions (Signed)
Muscle Strain  A muscle strain is an injury that occurs when a muscle is stretched beyond its normal length. Usually, a small number of muscle fibers are torn when this happens. There are three types of muscle strains. First-degree strains have the least amount of muscle fiber tearing and the least amount of pain. Second-degree and third-degree strains have more tearing and pain.  Usually, recovery from muscle strain takes 1-2 weeks. Complete healing normally takes 5-6 weeks.  What are the causes?  This condition is caused when a sudden, violent force is placed on a muscle and stretches it too far. This may occur with a fall, lifting, or sports.  What increases the risk?  This condition is more likely to develop in athletes and people who are physically active.  What are the signs or symptoms?  Symptoms of this condition include:  · Pain.  · Bruising.  · Swelling.  · Trouble using the muscle.  How is this diagnosed?  This condition is diagnosed based on a physical exam and your medical history. Tests may also be done, including an X-ray, ultrasound, or MRI.  How is this treated?  This condition is initially treated with PRICE therapy. This therapy involves:  · Protecting the muscle from being injured again.  · Resting the injured muscle.  · Icing the injured muscle.  · Applying pressure (compression) to the injured muscle. This may be done with a splint or elastic bandage.  · Raising (elevating) the injured muscle.  Your health care provider may also recommend medicine for pain.  Follow these instructions at home:  If you have a splint:  · Wear the splint as told by your health care provider. Remove it only as told by your health care provider.  · Loosen the splint if your fingers or toes tingle, become numb, or turn cold and blue.  · Keep the splint clean.  · If the splint is not waterproof:  ? Do not let it get wet.  ? Cover it with a watertight covering when you take a bath or a shower.  Managing pain, stiffness,  and swelling    · If directed, put ice on the injured area.  ? If you have a removable splint, remove it as told by your health care provider.  ? Put ice in a plastic bag.  ? Place a towel between your skin and the bag.  ? Leave the ice on for 20 minutes, 2-3 times a day.  · Move your fingers or toes often to avoid stiffness and to lessen swelling.  · Raise (elevate) the injured area above the level of your heart while you are sitting or lying down.  · Wear an elastic bandage as told by your health care provider. Make sure that it is not too tight.  General instructions  · Take over-the-counter and prescription medicines only as told by your health care provider.  · Restrict your activity and rest the injured muscle as told by your health care provider. Gentle movements may be allowed.  · If physical therapy was prescribed, do exercises as told by your health care provider.  · Do not put pressure on any part of the splint until it is fully hardened. This may take several hours.  · Do not use any products that contain nicotine or tobacco, such as cigarettes and e-cigarettes. These can delay bone healing. If you need help quitting, ask your health care provider.  · Ask your health care provider when it   You have more pain or swelling in the injured area. Get help right away if:  You have numbness or tingling or lose a lot of strength in the injured area. Summary  A muscle strain is an injury that occurs when a muscle is stretched beyond its normal length.  This condition is caused when a sudden, violent force is placed on a muscle and stretches it too far.  This condition is initially treated with PRICE therapy, which involves protecting, resting,  icing, compressing, and elevating.  Gentle movements may be allowed. If physical therapy was prescribed, do exercises as told by your health care provider. This information is not intended to replace advice given to you by your health care provider. Make sure you discuss any questions you have with your health care provider. Document Released: 12/02/2005 Document Revised: 11/14/2017 Document Reviewed: 01/08/2017 Elsevier Patient Education  2020 Elsevier Inc.  

## 2019-10-26 NOTE — Assessment & Plan Note (Addendum)
Unclear etiology as most of her pain is scapular with physical exam not suggestive of any rotator cuff or labral pathology. - Relative rest with home exercises given for scapular dyskinesia - Cont Ibuprofen/Tylenol with ice/heat - Zanaflex for suspected muscle spasms in the scapular muscles - If no improvement consider formal PT vs steroid injection or both at next appointment

## 2019-11-17 ENCOUNTER — Other Ambulatory Visit: Payer: Self-pay

## 2019-11-17 ENCOUNTER — Ambulatory Visit (INDEPENDENT_AMBULATORY_CARE_PROVIDER_SITE_OTHER): Payer: Self-pay | Admitting: Family Medicine

## 2019-11-17 ENCOUNTER — Encounter: Payer: Self-pay | Admitting: Family Medicine

## 2019-11-17 DIAGNOSIS — F329 Major depressive disorder, single episode, unspecified: Secondary | ICD-10-CM

## 2019-11-17 DIAGNOSIS — F32A Depression, unspecified: Secondary | ICD-10-CM

## 2019-11-17 DIAGNOSIS — J449 Chronic obstructive pulmonary disease, unspecified: Secondary | ICD-10-CM

## 2019-11-17 DIAGNOSIS — F419 Anxiety disorder, unspecified: Secondary | ICD-10-CM

## 2019-11-17 MED ORDER — LORAZEPAM 0.5 MG PO TABS: 0.5000 mg | ORAL_TABLET | Freq: Every day | ORAL | 0 refills | Status: DC | PRN

## 2019-11-17 MED ORDER — FLUTICASONE FUROATE-VILANTEROL 200-25 MCG/INH IN AEPB: 1.0000 | INHALATION_SPRAY | Freq: Every day | RESPIRATORY_TRACT | 0 refills | Status: DC

## 2019-11-17 MED ORDER — TIZANIDINE HCL 4 MG PO TABS: 4.0000 mg | ORAL_TABLET | Freq: Four times a day (QID) | ORAL | 0 refills | Status: DC | PRN

## 2019-11-17 MED ORDER — FLUTICASONE FUROATE-VILANTEROL 100-25 MCG/INH IN AEPB: 1.0000 | INHALATION_SPRAY | Freq: Every day | RESPIRATORY_TRACT | 0 refills | Status: DC

## 2019-11-17 NOTE — Patient Instructions (Addendum)
It was nice seeing you today Ms. Waag!  We are sending you with another Breo inhaler for your COPD.  Please take this once per day.  I am sending Ativan for your anxiety.  Please take this only as needed. Please consider starting a better medication for your anxiety and depression.  I would like to discuss this further with you in about 1 month.  I am refilling your Zanaflex for your right muscle pain.  Please only take this before bedtime.  If you have any questions or concerns, please feel free to call the clinic.   Be well,  Dr. Shan Levans

## 2019-11-17 NOTE — Progress Notes (Signed)
Subjective:    Mariah Rice - 48 y.o. female MRN [id number]  Date of birth: 20-Aug-1971  CC:  Kamarie M Sedgwick is here for anxiety and COPD.  HPI: Anxiety and depression Patient reports that she has felt more anxious and irritable lately.  She says that she "blows up" easily and does not want to feel that way.  She also says that she is tearful frequently.  She says that her father died about 5 years ago around this time, and holidays are especially difficult for her.  She had quit smoking for a short time but started back due to her anxiety.  She continues to want to quit cigarettes.  She would like either Xanax or Valium for treatment of her anxiety because these have worked well in the past for her.  She says she has tried medications like Zoloft without any effect.  COPD Patient reports that she is doing well with her COPD as long as she takes her maintenance inhaler daily.  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  She still does not have medical insurance so is requesting a sample inhaler today.  Health Maintenance:  There are no preventive care reminders to display for this patient.  -  reports that she has been smoking cigarettes. She started smoking about 32 years ago. She has a 15.00 pack-year smoking history. She has never used smokeless tobacco. - Review of Systems: Per HPI. - Past Medical History: Patient Active Problem List   Diagnosis Date Noted  . Anxiety and depression   . Small bowel obstruction ([place])   . Abnormal finding on GI tract imaging   . Genetic testing 03/08/2019  . COPD suggested by initial evaluation ([place]) 02/15/2019  . Family history of breast cancer 01/26/2019  . Unintentional weight loss 12/22/2018  . Low back pain potentially associated with radiculopathy 06/06/2018  . Prediabetes 05/31/2018  . Migraine without aura and with status migrainosus, not intractable 11/30/2017  . Rosacea 10/14/2016  .  Right shoulder pain 10/06/2016  . Mood disorder ([place]) 10/06/2016  . GERD (gastroesophageal reflux disease) 10/19/2015  . H/O bilateral oophorectomy 08/13/2015  . S/P laparoscopic assisted vaginal hysterectomy (LAVH) 07/11/2015  . GAD (generalized anxiety disorder) 01/06/2013  . Tobacco abuse 11/10/2012  . Overweight 11/10/2012  . Hypertension 11/10/2012   - Medications: reviewed and updated   Objective:   Physical Exam BP 124/64   Pulse 73   Wt 198 lb 12.8 oz (90.2 kg)   LMP  (LMP Unknown) Comment: on Megace  SpO2 100%   BMI 34.12 kg/m  Gen: NAD, alert, cooperative with exam, well-appearing CV: RRR, good S1/S2, no murmur, no edema Resp: CTABL, no wheezes, non-labored Psych: good insight, alert and oriented, tearful during conversation   Depression screen [facility] 2/9 11/19/2019 11/17/2019 10/25/2019  Decreased Interest 3 3 0  Down, Depressed, Hopeless 1 1 0  PHQ - 2 Score 4 4 0  Altered sleeping 2 - -  Tired, decreased energy 1 - -  Change in appetite 0 - -  Feeling bad or failure about yourself  1 - -  Trouble concentrating 2 - -  Moving slowly or fidgety/restless 1 - -  Suicidal thoughts 0 - -  PHQ-9 Score 11 - -  Some recent data might be hidden       Assessment & Plan:   COPD suggested by initial evaluation ([place]) Sample Breo inhaler provided today.  Patient  counseled on the importance of smoking cessation and encouraged to continue her efforts.  Anxiety and depression Patient counseled extensively that SSRIs have been shown to be more effective for depression and anxiety and are less habit-forming than benzodiazepines.  We agreed that she will return in about 1 month to discuss starting an SSRI.  In the meantime, I will give her a one-time dose of 30 tablets of Ativan 0.5 mg to be taken only when needed.  I reiterated with her that I will not be refilling this medication but that I am always happy to discuss an SSRI and referral to further therapy.    Maia Breslow,  M.D. 11/19/2019, 10:14 AM PGY-3, [place]

## 2019-11-19 DIAGNOSIS — F419 Anxiety disorder, unspecified: Secondary | ICD-10-CM | POA: Insufficient documentation

## 2019-11-19 DIAGNOSIS — F329 Major depressive disorder, single episode, unspecified: Secondary | ICD-10-CM | POA: Insufficient documentation

## 2019-11-19 DIAGNOSIS — F32A Depression, unspecified: Secondary | ICD-10-CM | POA: Insufficient documentation

## 2019-11-19 NOTE — Assessment & Plan Note (Signed)
Patient counseled extensively that SSRIs have been shown to be more effective for depression and anxiety and are less habit-forming than benzodiazepines.  We agreed that she will return in about 1 month to discuss starting an SSRI.  In the meantime, I will give her a one-time dose of 30 tablets of Ativan 0.5 mg to be taken only when needed.  I reiterated with her that I will not be refilling this medication but that I am always happy to discuss an SSRI and referral to further therapy.

## 2019-11-19 NOTE — Assessment & Plan Note (Signed)
Sample Breo inhaler provided today.  Patient counseled on the importance of smoking cessation and encouraged to continue her efforts.

## 2020-01-05 ENCOUNTER — Encounter: Payer: Self-pay | Admitting: Family Medicine

## 2020-01-05 ENCOUNTER — Ambulatory Visit (INDEPENDENT_AMBULATORY_CARE_PROVIDER_SITE_OTHER): Payer: Self-pay | Admitting: Family Medicine

## 2020-01-05 ENCOUNTER — Other Ambulatory Visit: Payer: Self-pay

## 2020-01-05 DIAGNOSIS — F39 Unspecified mood [affective] disorder: Secondary | ICD-10-CM

## 2020-01-05 DIAGNOSIS — J449 Chronic obstructive pulmonary disease, unspecified: Secondary | ICD-10-CM

## 2020-01-05 MED ORDER — SERTRALINE HCL 50 MG PO TABS: 50.0000 mg | ORAL_TABLET | Freq: Every day | ORAL | 3 refills | Status: DC

## 2020-01-05 MED ORDER — UMECLIDINIUM-VILANTEROL 62.5-25 MCG/INH IN AEPB: 1.0000 | INHALATION_SPRAY | Freq: Every day | RESPIRATORY_TRACT | 0 refills | Status: DC

## 2020-01-05 NOTE — Patient Instructions (Signed)
It was nice seeing you today Mariah Rice!  Please make an appointment with your mental health provider to discuss your anxiety and what medications or other strategies they have to help you with that.  We are providing the Anoro Ellipta inhaler today since this is worked well for you in the past.  Please continue to cut down on her smoking since this is the most important thing you can do for your health.  I would like to see you back in about 2 months or earlier if needed.  Congratulations on your continued increased activity and weight loss.  If you have any questions or concerns, please feel free to call the clinic.   Be well,  Dr. Shan Levans

## 2020-01-05 NOTE — Progress Notes (Signed)
Subjective:    Mariah Rice - 49 y.o. female MRN [id number]  Date of birth: 1971/11/02  CC:  Mariah Rice is here for follow-up of anxiety and COPD.  HPI: Anxiety: Provider at [facility] is prescribing Trileptal and Vraylar, and she has been encouraged to talk to a therapist by this provider, but she says that she has not had the time to do this.  She recently had a death in the family and has had significant stress due to this.  She continues to enjoy her job.  She says that the Ativan was helpful.  COPD: Unfortunately patient is continuing to smoke about 8 cigarettes/day.  She says she does get winded when she is more active, but she denies any increased cough or sputum production.  She says that the [facility] inhaler was not as helpful as the Anoro inhaler.  Health Maintenance:  There are no preventive care reminders to display for this patient.  -  reports that she has been smoking cigarettes. She started smoking about 33 years ago. She has a 15.00 pack-year smoking history. She has never used smokeless tobacco. - Review of Systems: Per HPI. - Past Medical History: Patient Active Problem List   Diagnosis Date Noted  . Anxiety and depression   . Small bowel obstruction ([place])   . Abnormal finding on GI tract imaging   . Genetic testing 03/08/2019  . COPD suggested by initial evaluation ([place]) 02/15/2019  . Family history of breast cancer 01/26/2019  . Unintentional weight loss 12/22/2018  . Low back pain potentially associated with radiculopathy 06/06/2018  . Prediabetes 05/31/2018  . Migraine without aura and with status migrainosus, not intractable 11/30/2017  . Rosacea 10/14/2016  . Right shoulder pain 10/06/2016  . Mood disorder ([place]) 10/06/2016  . GERD (gastroesophageal reflux disease) 10/19/2015  . H/O bilateral oophorectomy 08/13/2015  . S/P laparoscopic assisted vaginal hysterectomy (LAVH) 07/11/2015  . GAD (generalized anxiety disorder) 01/06/2013  . Tobacco abuse  11/10/2012  . Overweight 11/10/2012  . Hypertension 11/10/2012   - Medications: reviewed and updated   Objective:   Physical Exam BP 114/70   Pulse 74   Wt 196 lb 12.8 oz (89.3 kg)   LMP  (LMP Unknown) Comment: on Megace  SpO2 98%   BMI 33.78 kg/m  Gen: NAD, alert, cooperative with exam, well-appearing CV: RRR, good S1/S2, no murmur, no edema Resp: CTABL, no wheezes, non-labored Psych: good insight, alert and oriented, appropriate affect  GAD 7 : Generalized Anxiety Score 01/05/2020 04/22/2017 03/25/2017 09/30/2016  Nervous, Anxious, on Edge 3 3 3 3   Control/stop worrying 3 3 0 2  Worry too much - different things 3 3 3 2   Trouble relaxing 3 3 3 3   Restless 2 3 3 2   Easily annoyed or irritable 3 3 3 3   Afraid - awful might happen 2 3 0 3  Total GAD 7 Score 19 21 15 18   Anxiety Difficulty Very difficult Extremely difficult Very difficult Somewhat difficult    Depression screen [facility] 2/9 01/05/2020 01/05/2020 11/19/2019  Decreased Interest 1 3 3   Down, Depressed, Hopeless 1 2 1   PHQ - 2 Score 2 5 4   Altered sleeping 0 - 2  Tired, decreased energy 1 - 1  Change in appetite 0 - 0  Feeling bad or failure about yourself  0 - 1  Trouble concentrating 1 - 2  Moving slowly or fidgety/restless 1 - 1  Suicidal thoughts 0 - 0  PHQ-9 Score 5 -  11  Difficult doing work/chores Somewhat difficult - -  Some recent data might be hidden      Assessment & Plan:   Mood disorder ([place]) That I will not refill her Ativan and discussed why it was not the best medication for treatment of her anxiety.  I encouraged her to speak with her psychiatrist regarding her anxiety, since it is not appropriate for me to also prescribe psychotropic medications.  I did suggest that an SSRI may be beneficial, but will defer to her psychiatrist on this.  I also encouraged her to consider speaking with a therapist, since evidence shows that therapy and medication are more effective than either of these alone.  COPD  suggested by initial evaluation ([place]) Does not appear to be in an exacerbation at this time.  Lung exam was reassuring.  Patient does need PFTs to be performed, but we are currently not doing this at our clinic due to Covid.  Prescribed a sample of the Anoro Ellipta inhaler since this was beneficial for her in the past.  Discussed the importance of smoking cessation and encouraged her to keep trying.    Maia Breslow, M.D. 01/06/2020, 7:52 AM PGY-3, [place]

## 2020-01-06 NOTE — Assessment & Plan Note (Signed)
Does not appear to be in an exacerbation at this time.  Lung exam was reassuring.  Patient does need PFTs to be performed, but we are currently not doing this at our clinic due to Covid.  Prescribed a sample of the Anoro Ellipta inhaler since this was beneficial for her in the past.  Discussed the importance of smoking cessation and encouraged her to keep trying.

## 2020-01-06 NOTE — Assessment & Plan Note (Signed)
That I will not refill her Ativan and discussed why it was not the best medication for treatment of her anxiety.  I encouraged her to speak with her psychiatrist regarding her anxiety, since it is not appropriate for me to also prescribe psychotropic medications.  I did suggest that an SSRI may be beneficial, but will defer to her psychiatrist on this.  I also encouraged her to consider speaking with a therapist, since evidence shows that therapy and medication are more effective than either of these alone.

## 2020-01-10 ENCOUNTER — Encounter: Payer: Self-pay | Admitting: Family Medicine

## 2020-01-11 ENCOUNTER — Other Ambulatory Visit: Payer: Self-pay | Admitting: Family Medicine

## 2020-01-11 MED ORDER — NYSTATIN 100000 UNIT/ML MT SUSP: 200000.0000 [IU] | Freq: Four times a day (QID) | OROMUCOSAL | 0 refills | Status: DC

## 2020-01-18 ENCOUNTER — Encounter: Payer: Self-pay | Admitting: Family Medicine

## 2020-01-19 ENCOUNTER — Other Ambulatory Visit: Payer: Self-pay | Admitting: Family Medicine

## 2020-01-19 MED ORDER — IBUPROFEN 600 MG PO TABS: 600.0000 mg | ORAL_TABLET | Freq: Three times a day (TID) | ORAL | 0 refills | Status: DC | PRN

## 2020-02-02 ENCOUNTER — Other Ambulatory Visit: Payer: Self-pay | Admitting: Family Medicine

## 2020-02-02 ENCOUNTER — Encounter: Payer: Self-pay | Admitting: Family Medicine

## 2020-02-02 MED ORDER — IBUPROFEN 600 MG PO TABS: 600.0000 mg | ORAL_TABLET | Freq: Three times a day (TID) | ORAL | 0 refills | Status: DC | PRN

## 2020-02-02 MED ORDER — UMECLIDINIUM-VILANTEROL 62.5-25 MCG/INH IN AEPB: 1.0000 | INHALATION_SPRAY | Freq: Every day | RESPIRATORY_TRACT | 0 refills | Status: DC

## 2020-02-10 ENCOUNTER — Telehealth: Payer: Self-pay | Admitting: *Deleted

## 2020-02-10 NOTE — Telephone Encounter (Signed)
LVM to call office to go over screening questions prior to visit.Mariah Rice, CMA  

## 2020-02-11 ENCOUNTER — Encounter: Payer: Self-pay | Admitting: Family Medicine

## 2020-02-11 ENCOUNTER — Ambulatory Visit (INDEPENDENT_AMBULATORY_CARE_PROVIDER_SITE_OTHER): Payer: BC Managed Care – PPO | Admitting: Family Medicine

## 2020-02-11 ENCOUNTER — Other Ambulatory Visit: Payer: Self-pay

## 2020-02-11 VITALS — BP 130/74 | HR 58 | Wt 205.6 lb

## 2020-02-11 DIAGNOSIS — M545 Low back pain, unspecified: Secondary | ICD-10-CM

## 2020-02-11 DIAGNOSIS — G8929 Other chronic pain: Secondary | ICD-10-CM

## 2020-02-11 DIAGNOSIS — I10 Essential (primary) hypertension: Secondary | ICD-10-CM

## 2020-02-11 DIAGNOSIS — J988 Other specified respiratory disorders: Secondary | ICD-10-CM | POA: Diagnosis not present

## 2020-02-11 DIAGNOSIS — B9789 Other viral agents as the cause of diseases classified elsewhere: Secondary | ICD-10-CM

## 2020-02-11 DIAGNOSIS — G43001 Migraine without aura, not intractable, with status migrainosus: Secondary | ICD-10-CM | POA: Diagnosis not present

## 2020-02-11 DIAGNOSIS — Z90722 Acquired absence of ovaries, bilateral: Secondary | ICD-10-CM

## 2020-02-11 DIAGNOSIS — Z720 Tobacco use: Secondary | ICD-10-CM

## 2020-02-11 DIAGNOSIS — J449 Chronic obstructive pulmonary disease, unspecified: Secondary | ICD-10-CM

## 2020-02-11 MED ORDER — OMEPRAZOLE 20 MG PO CPDR: 20.0000 mg | DELAYED_RELEASE_CAPSULE | Freq: Every day | ORAL | 0 refills | Status: DC

## 2020-02-11 MED ORDER — UMECLIDINIUM-VILANTEROL 62.5-25 MCG/INH IN AEPB: 1.0000 | INHALATION_SPRAY | Freq: Every day | RESPIRATORY_TRACT | 0 refills | Status: DC

## 2020-02-11 MED ORDER — ESTRADIOL 2 MG PO TABS: 2.0000 mg | ORAL_TABLET | Freq: Every day | ORAL | 3 refills | Status: DC

## 2020-02-11 MED ORDER — TIZANIDINE HCL 4 MG PO TABS: 4.0000 mg | ORAL_TABLET | Freq: Four times a day (QID) | ORAL | 0 refills | Status: DC | PRN

## 2020-02-11 MED ORDER — AMLODIPINE BESYLATE 5 MG PO TABS: 5.0000 mg | ORAL_TABLET | Freq: Every day | ORAL | 3 refills | Status: DC

## 2020-02-11 MED ORDER — PROPRANOLOL HCL ER 60 MG PO CP24: 60.0000 mg | ORAL_CAPSULE | Freq: Every day | ORAL | 3 refills | Status: DC

## 2020-02-11 MED ORDER — PRAVASTATIN SODIUM 40 MG PO TABS: 40.0000 mg | ORAL_TABLET | Freq: Every day | ORAL | 3 refills | Status: DC

## 2020-02-11 MED ORDER — ALBUTEROL SULFATE HFA 108 (90 BASE) MCG/ACT IN AERS: 2.0000 | INHALATION_SPRAY | Freq: Four times a day (QID) | RESPIRATORY_TRACT | 3 refills | Status: DC | PRN

## 2020-02-11 MED ORDER — IBUPROFEN 600 MG PO TABS: 600.0000 mg | ORAL_TABLET | Freq: Three times a day (TID) | ORAL | 0 refills | Status: DC | PRN

## 2020-02-11 NOTE — Assessment & Plan Note (Signed)
In contemplative phase currently.  Encouraged her to follow-up whenever she decides she is ready to cut down on smoking again.

## 2020-02-11 NOTE — Patient Instructions (Signed)
It was nice meeting you today Ms. Marley!  Today we refilled all of your medications and sent them for a 80-month supply.  I do want you to substitute Tylenol and tizanidine for ibuprofen so that you do not take ibuprofen every day.  We will keep your propranolol dose the same and use ibuprofen and Tylenol for migraines when you have them.  Please continue to think about cutting down on your cigarette use and I am always happy to talk to you more about this when you are ready.  Please come back to see me in about 2 months and feel free to call or message me sooner if needed.  If you have any questions or concerns, please feel free to call the clinic.   Be well,  Dr. Shan Levans

## 2020-02-11 NOTE — Progress Notes (Signed)
    SUBJECTIVE:   CHIEF COMPLAINT / HPI:  Medication refills  Mariah Rice reports that she has obtained insurance since her last appointment with me and is also now seeing a mental health provider.  She would like refills of her medications today due to her new insurance.  She says that she is doing well overall but continues to smoke and does not ready to work on quitting right now.  She says that her stress level is "okay" and that she continues to like her job.  Her Anoro Ellipta is doing well at controlling her COPD.  She says that her migraines are a little worse than normal.  She does continue to take ibuprofen about once per day for her back pain.  PERTINENT  PMH / PSH: Tobacco use disorder, anxiety, COPD  OBJECTIVE:   BP 130/74   Pulse (!) 58   Wt 205 lb 9.6 oz (93.3 kg)   LMP  (LMP Unknown) Comment: on Megace  SpO2 99%   BMI 35.29 kg/m   General: well appearing, appears stated age, pleasant Cardiac: RRR, no MRG Respiratory: CTAB, no rhonchi, rales, or wheezing, normal work of breathing Psych: appropriate mood and affect    ASSESSMENT/PLAN:   Migraine without aura and with status migrainosus, not intractable Since patient is allergic to sumatriptan and has a pulse of 58 today, we will keep her propranolol at the same dose.  Would not add amitriptyline or topiramate currently due to her other psychotropic medications.  I encouraged her to take ibuprofen with food and Tylenol for pain relief for occasional migraines.  Tobacco abuse In contemplative phase currently.  Encouraged her to follow-up whenever she decides she is ready to cut down on smoking again.  H/O bilateral oophorectomy Counseled patient that she should start weaning down on estradiol when she is at the menopausal age, which should be around age 49.  She expressed understanding with this.  COPD suggested by initial evaluation ([place]) Well-controlled today.  Refilled albuterol and Anoro Ellipta inhalers.   Chronic bilateral low back pain without sciatica Cautioned patient not to use ibuprofen every day due to its long term side effects of gastric, renal, and cardiac injury.  She agreed to substitute tylenol and tizanidine for pain relief and to take ibuprofen with food sparingly.     Mariah Alu, MD [place]

## 2020-02-11 NOTE — Assessment & Plan Note (Addendum)
Since patient is allergic to sumatriptan and has a pulse of 58 today, we will keep her propranolol at the same dose.  Would not add amitriptyline or topiramate currently due to her other psychotropic medications.  I encouraged her to take ibuprofen with food and Tylenol for pain relief for occasional migraines.

## 2020-02-11 NOTE — Assessment & Plan Note (Signed)
Cautioned patient not to use ibuprofen every day due to its long term side effects of gastric, renal, and cardiac injury.  She agreed to substitute tylenol and tizanidine for pain relief and to take ibuprofen with food sparingly.

## 2020-02-11 NOTE — Assessment & Plan Note (Signed)
Counseled patient that she should start weaning down on estradiol when she is at the menopausal age, which should be around age 49.  She expressed understanding with this.

## 2020-02-11 NOTE — Assessment & Plan Note (Signed)
Well-controlled today.  Refilled albuterol and Anoro Ellipta inhalers.

## 2020-03-16 ENCOUNTER — Encounter: Payer: Self-pay | Admitting: Family Medicine

## 2020-03-16 ENCOUNTER — Ambulatory Visit (INDEPENDENT_AMBULATORY_CARE_PROVIDER_SITE_OTHER): Payer: BC Managed Care – PPO | Admitting: Family Medicine

## 2020-03-16 ENCOUNTER — Other Ambulatory Visit: Payer: Self-pay

## 2020-03-16 VITALS — BP 114/74 | HR 66 | Ht 64.0 in | Wt 207.4 lb

## 2020-03-16 DIAGNOSIS — W57XXXA Bitten or stung by nonvenomous insect and other nonvenomous arthropods, initial encounter: Secondary | ICD-10-CM | POA: Diagnosis not present

## 2020-03-16 DIAGNOSIS — S30861A Insect bite (nonvenomous) of abdominal wall, initial encounter: Secondary | ICD-10-CM

## 2020-03-16 NOTE — Assessment & Plan Note (Signed)
Tick bite of abdomen, unknown duration, unknown breed, removed 5 days ago, no concerning symptoms  Reviewed CDC criteria for prophylaxis for tick bites and patient agrees that she does not meet the criteria.  Reviewed symptoms concerning for pathologic infection after tick bite with patient advised her that if she gets any of these in the near future to make sure that the physician she sees is aware that she had a history of a tick bite.  Patient understands

## 2020-03-16 NOTE — Progress Notes (Signed)
    SUBJECTIVE:   CHIEF COMPLAINT / HPI: Tick bite  Patient comes in to verify that she does not need medication for tick bite.  She is unsure when she was bit first and how long this lasted, she did notice the tick on 27 March(5 days ago) and pulled it off.  She said there was a decent amount of bleeding when she initially did it, she has had no rashes and no fevers.  She has had no nausea vomiting or other symptoms, no muscle aches.  She has been feeling completely well.  There is no sign of infection per her at the site  PERTINENT  PMH / PSH: None  OBJECTIVE:   BP 114/74   Pulse 66   Ht 5\' 4"  (1.626 m)   Wt 207 lb 6.4 oz (94.1 kg)   LMP  (LMP Unknown) Comment: on Megace  SpO2 99%   BMI 35.60 kg/m   General: Pleasant and alert, no distress Abdominal: Healing scab of tick bite on left central abdomen, no target lesion, no significant rash or sign of infection around the wound.  Scab is approximately 1 cm diameter  ASSESSMENT/PLAN:   Tick bite of abdomen Tick bite of abdomen, unknown duration, unknown breed, removed 5 days ago, no concerning symptoms  Reviewed CDC criteria for prophylaxis for tick bites and patient agrees that she does not meet the criteria.  Reviewed symptoms concerning for pathologic infection after tick bite with patient advised her that if she gets any of these in the near future to make sure that the physician she sees is aware that she had a history of a tick bite.  Patient understands     Sherene Sires, [place]

## 2020-03-23 ENCOUNTER — Ambulatory Visit: Payer: BC Managed Care – PPO | Admitting: Family Medicine

## 2020-03-27 ENCOUNTER — Other Ambulatory Visit: Payer: Self-pay | Admitting: *Deleted

## 2020-03-27 MED ORDER — UMECLIDINIUM-VILANTEROL 62.5-25 MCG/INH IN AEPB: 1.0000 | INHALATION_SPRAY | Freq: Every day | RESPIRATORY_TRACT | 0 refills | Status: DC

## 2020-04-05 ENCOUNTER — Encounter: Payer: Self-pay | Admitting: Family Medicine

## 2020-04-05 ENCOUNTER — Ambulatory Visit (INDEPENDENT_AMBULATORY_CARE_PROVIDER_SITE_OTHER): Payer: BC Managed Care – PPO | Admitting: Family Medicine

## 2020-04-05 ENCOUNTER — Other Ambulatory Visit: Payer: Self-pay

## 2020-04-05 VITALS — BP 120/76 | HR 79 | Ht 64.0 in | Wt 209.2 lb

## 2020-04-05 DIAGNOSIS — I10 Essential (primary) hypertension: Secondary | ICD-10-CM | POA: Diagnosis not present

## 2020-04-05 DIAGNOSIS — R7303 Prediabetes: Secondary | ICD-10-CM | POA: Diagnosis not present

## 2020-04-05 DIAGNOSIS — M545 Low back pain, unspecified: Secondary | ICD-10-CM

## 2020-04-05 DIAGNOSIS — E663 Overweight: Secondary | ICD-10-CM

## 2020-04-05 DIAGNOSIS — Z720 Tobacco use: Secondary | ICD-10-CM | POA: Diagnosis not present

## 2020-04-05 DIAGNOSIS — J449 Chronic obstructive pulmonary disease, unspecified: Secondary | ICD-10-CM

## 2020-04-05 DIAGNOSIS — G8929 Other chronic pain: Secondary | ICD-10-CM

## 2020-04-05 LAB — POCT GLYCOSYLATED HEMOGLOBIN (HGB A1C): Hemoglobin A1C: 5.4 % (ref 4.0–5.6)

## 2020-04-05 MED ORDER — TIOTROPIUM BROMIDE MONOHYDRATE 18 MCG IN CAPS: 18.0000 ug | ORAL_CAPSULE | Freq: Every day | RESPIRATORY_TRACT | 11 refills | Status: DC

## 2020-04-05 NOTE — Progress Notes (Signed)
    SUBJECTIVE:   CHIEF COMPLAINT / HPI:   BACK PAIN  Back pain began several years ago, getting somewhat worse Pain is described as ache. Patient has tried tylenol, ibuprofen. Pain does not radiate. History of trauma or injury: no Patient believes might be causing their pain: arthritis  Prior history of similar pain: yes History of cancer: no Weak immune system:  no History of IV drug use: no History of steroid use: no  Symptoms Incontinence of bowel or bladder:  no Numbness of leg: no Fever: no Rest or Night pain: no Weight Loss:  no Rash: no  COPD Continues to smoke Reports that Spiriva has been the most helpful inhaler for her COPD control in the past No increased cough, shortness of breath, or sputum production Does report some shortness of breath when walking uphill  ROS see HPI Smoking Status noted.   PERTINENT  PMH / PSH: COPD, small bowel obstruction, migraine, low back pain  OBJECTIVE:   BP 120/76   Pulse 79   Ht 5\' 4"  (1.626 m)   Wt 209 lb 3.2 oz (94.9 kg)   LMP  (LMP Unknown) Comment: on Megace  SpO2 98%   BMI 35.91 kg/m   General: well appearing, appears stated age Cardiac: RRR, no MRG Respiratory: CTAB, no rhonchi, rales, or wheezing, normal work of breathing   Back - Normal skin, Spine with normal alignment and no deformity.  No tenderness to vertebral process palpation.  Mild Paraspinous muscles tenderness in lumbar region without spasm.   Range of motion is full at neck and lumbar sacral regions but causes some pain on lumbar flexion and extension.   Psych: appropriate mood and affect  ASSESSMENT/PLAN:   Tobacco abuse Patient continues to be in the contemplative phase.  Counseled her again on the importance of tobacco cessation.    Overweight Discussed the importance of weight loss for relief of back pain.  Will order lipid panel today.  Hemoglobin A1c is below prediabetic and diabetic range today at 5.4.  Chronic bilateral low back  pain without sciatica No red flag symptoms today.  Likely osteoarthritis given chronic nature and patient's age.  Reviewed symptomatic management and the importance of staying active.  COPD suggested by initial evaluation [facility]) Well-controlled currently.  Ordered Spiriva inhaler today.  Patient will begin using this after she has finished the Ellipta Incruse.  Hypertension Will obtain BMP today.     Kathrene Alu, MD [place]

## 2020-04-05 NOTE — Patient Instructions (Addendum)
It was nice seeing you today Mariah Rice!  I have ordered Spiriva.  Please let me know if you have any issues with this medication, please let me know.  We are checking your electrolytes and cholesterol today.  I will let you know what these tests show once they return.  If you have any questions or concerns, please feel free to call the clinic.   Be well,  Dr. Shan Levans  Mediterranean Diet A Mediterranean diet refers to food and lifestyle choices that are based on the traditions of countries located on the [place]. This way of eating has been shown to help prevent certain conditions and improve outcomes for people who have chronic diseases, like kidney disease and heart disease. What are tips for following this plan? Lifestyle  Cook and eat meals together with your family, when possible.  Drink enough fluid to keep your urine clear or pale yellow.  Be physically active every day. This includes: ? Aerobic exercise like running or swimming. ? Leisure activities like gardening, walking, or housework.  Get 7-8 hours of sleep each night.  If recommended by your health care provider, drink red wine in moderation. This means 1 glass a day for nonpregnant women and 2 glasses a day for men. A glass of wine equals 5 oz (150 mL). Reading food labels   Check the serving size of packaged foods. For foods such as rice and pasta, the serving size refers to the amount of cooked product, not dry.  Check the total fat in packaged foods. Avoid foods that have saturated fat or trans fats.  Check the ingredients list for added sugars, such as corn syrup. Shopping  At the grocery store, buy most of your food from the areas near the walls of the store. This includes: ? Fresh fruits and vegetables (produce). ? Grains, beans, nuts, and seeds. Some of these may be available in unpackaged forms or large amounts (in bulk). ? Fresh seafood. ? Poultry and eggs. ? Low-fat dairy products.  Buy  whole ingredients instead of prepackaged foods.  Buy fresh fruits and vegetables in-season from local farmers markets.  Buy frozen fruits and vegetables in resealable bags.  If you do not have access to quality fresh seafood, buy precooked frozen shrimp or canned fish, such as tuna, salmon, or sardines.  Buy small amounts of raw or cooked vegetables, salads, or olives from the deli or salad bar at your store.  Stock your pantry so you always have certain foods on hand, such as olive oil, canned tuna, canned tomatoes, rice, pasta, and beans. Cooking  Cook foods with extra-virgin olive oil instead of using butter or other vegetable oils.  Have meat as a side dish, and have vegetables or grains as your main dish. This means having meat in small portions or adding small amounts of meat to foods like pasta or stew.  Use beans or vegetables instead of meat in common dishes like chili or lasagna.  Experiment with different cooking methods. Try roasting or broiling vegetables instead of steaming or sauteing them.  Add frozen vegetables to soups, stews, pasta, or rice.  Add nuts or seeds for added healthy fat at each meal. You can add these to yogurt, salads, or vegetable dishes.  Marinate fish or vegetables using olive oil, lemon juice, garlic, and fresh herbs. Meal planning   Plan to eat 1 vegetarian meal one day each week. Try to work up to 2 vegetarian meals, if possible.  Eat seafood 2  or more times a week.  Have healthy snacks readily available, such as: ? Vegetable sticks with hummus. ? [place] yogurt. ? Fruit and nut trail mix.  Eat balanced meals throughout the week. This includes: ? Fruit: 2-3 servings a day ? Vegetables: 4-5 servings a day ? Low-fat dairy: 2 servings a day ? Fish, poultry, or lean meat: 1 serving a day ? Beans and legumes: 2 or more servings a week ? Nuts and seeds: 1-2 servings a day ? Whole grains: 6-8 servings a day ? Extra-virgin olive oil: 3-4  servings a day  Limit red meat and sweets to only a few servings a month What are my food choices?  Mediterranean diet ? Recommended  Grains: Whole-grain pasta. Brown rice. Bulgar wheat. Polenta. Couscous. Whole-wheat bread. [place].  Vegetables: Artichokes. Beets. Broccoli. Cabbage. Carrots. Eggplant. Green beans. Chard. Kale. Spinach. Onions. Leeks. Peas. Squash. Tomatoes. Peppers. Radishes.  Fruits: Apples. Apricots. Avocado. Berries. Bananas. Cherries. Dates. Figs. Grapes. Lemons. Melon. Oranges. Peaches. Plums. Pomegranate.  Meats and other protein foods: Beans. Almonds. Sunflower seeds. Pine nuts. Peanuts. [place]. Salmon. Scallops. Shrimp. [place]. Tilapia. Clams. Oysters. Eggs.  Dairy: Low-fat milk. Cheese. Greek yogurt.  Beverages: Water. Red wine. Herbal tea.  Fats and oils: Extra virgin olive oil. Avocado oil. Grape seed oil.  Sweets and desserts: [place] yogurt with honey. Baked apples. Poached pears. Trail mix.  Seasoning and other foods: Basil. Cilantro. Coriander. Cumin. Mint. Parsley. Sage. Rosemary. Tarragon. Garlic. Oregano. Thyme. Pepper. Balsalmic vinegar. Tahini. Hummus. Tomato sauce. Olives. Mushrooms. ? Limit these  Grains: Prepackaged pasta or rice dishes. Prepackaged cereal with added sugar.  Vegetables: Deep fried potatoes (french fries).  Fruits: Fruit canned in syrup.  Meats and other protein foods: Beef. Pork. Lamb. Poultry with skin. Hot dogs. [place] Salines.  Dairy: Ice cream. Sour cream. Whole milk.  Beverages: Juice. Sugar-sweetened soft drinks. Beer. Liquor and spirits.  Fats and oils: Butter. Canola oil. Vegetable oil. Beef fat (tallow). Lard.  Sweets and desserts: Cookies. Cakes. Pies. Candy.  Seasoning and other foods: Mayonnaise. Premade sauces and marinades. The items listed may not be a complete list. Talk with your dietitian about what dietary choices are right for you. Summary  The Mediterranean diet includes both food and lifestyle  choices.  Eat a variety of fresh fruits and vegetables, beans, nuts, seeds, and whole grains.  Limit the amount of red meat and sweets that you eat.  Talk with your health care provider about whether it is safe for you to drink red wine in moderation. This means 1 glass a day for nonpregnant women and 2 glasses a day for men. A glass of wine equals 5 oz (150 mL). This information is not intended to replace advice given to you by your health care provider. Make sure you discuss any questions you have with your health care provider. Document Revised: 08/01/2016 Document Reviewed: 07/25/2016 Elsevier Patient Education  [place].

## 2020-04-06 ENCOUNTER — Encounter: Payer: Self-pay | Admitting: Family Medicine

## 2020-04-06 LAB — BASIC METABOLIC PANEL
BUN/Creatinine Ratio: 15 (ref 9–23)
BUN: 11 mg/dL (ref 6–24)
CO2: 23 mmol/L (ref 20–29)
Calcium: 9 mg/dL (ref 8.7–10.2)
Chloride: 94 mmol/L — ABNORMAL LOW (ref 96–106)
Creatinine, Ser: 0.73 mg/dL (ref 0.57–1.00)
GFR calc Af Amer: 114 mL/min/{1.73_m2} (ref >59)
GFR calc non Af Amer: 99 mL/min/{1.73_m2} (ref >59)
Glucose: 89 mg/dL (ref 65–99)
Potassium: 4.4 mmol/L (ref 3.5–5.2)
Sodium: 129 mmol/L — ABNORMAL LOW (ref 134–144)

## 2020-04-06 LAB — LIPID PANEL
Chol/HDL Ratio: 2.7 ratio (ref 0.0–4.4)
Cholesterol, Total: 162 mg/dL (ref 100–199)
HDL: 59 mg/dL (ref >39)
LDL Chol Calc (NIH): 77 mg/dL (ref 0–99)
Triglycerides: 151 mg/dL — ABNORMAL HIGH (ref 0–149)
VLDL Cholesterol Cal: 26 mg/dL (ref 5–40)

## 2020-04-06 NOTE — Assessment & Plan Note (Signed)
Patient continues to be in the contemplative phase.  Counseled her again on the importance of tobacco cessation.

## 2020-04-06 NOTE — Assessment & Plan Note (Signed)
Will obtain BMP today 

## 2020-04-06 NOTE — Assessment & Plan Note (Addendum)
Discussed the importance of weight loss for relief of back pain.  Will order lipid panel today.  Hemoglobin A1c is below prediabetic and diabetic range today at 5.4.

## 2020-04-06 NOTE — Assessment & Plan Note (Signed)
No red flag symptoms today.  Likely osteoarthritis given chronic nature and patient's age.  Reviewed symptomatic management and the importance of staying active.

## 2020-04-06 NOTE — Assessment & Plan Note (Addendum)
Well-controlled currently.  Ordered Spiriva inhaler today.  Patient will begin using this after she has finished the Ellipta Incruse.

## 2020-04-24 ENCOUNTER — Other Ambulatory Visit: Payer: Self-pay | Admitting: Family Medicine

## 2020-05-16 ENCOUNTER — Ambulatory Visit: Payer: BC Managed Care – PPO | Admitting: Family Medicine

## 2020-06-01 IMAGING — CT CT ABDOMEN AND PELVIS WITH CONTRAST
2 of 5 series · 16 of 46 positions shown, 18 images · IV contrast (ISOVUE 300)
Comparison: 08/16/2014 from [HOSPITAL]

CLINICAL DATA: Abdominal pain, nausea and vomiting, and
unintentional weight loss.

EXAM:
CT ABDOMEN AND PELVIS WITH CONTRAST
TECHNIQUE: Multidetector CT imaging of the abdomen and pelvis was performed
using the standard protocol following bolus administration of
intravenous contrast.
CONTRAST:  100mL OMNIPAQUE IOHEXOL 300 MG/ML  SOLN

[Series 2: abd/pel w · axial · 0.74mm/px · z∈[-570,-145]mm · 13 of 95 slices shown, 15 images]
[im 5/95  soft-tissue]
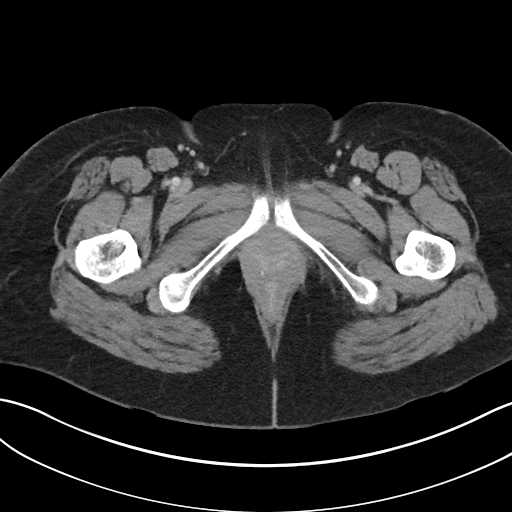
[im 5/95  bone]
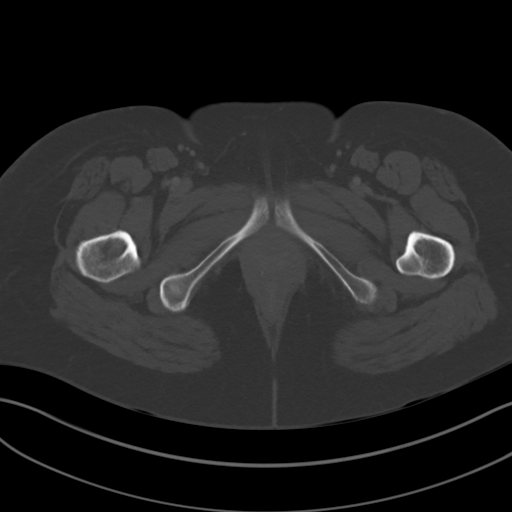
[im 15/95  soft-tissue]
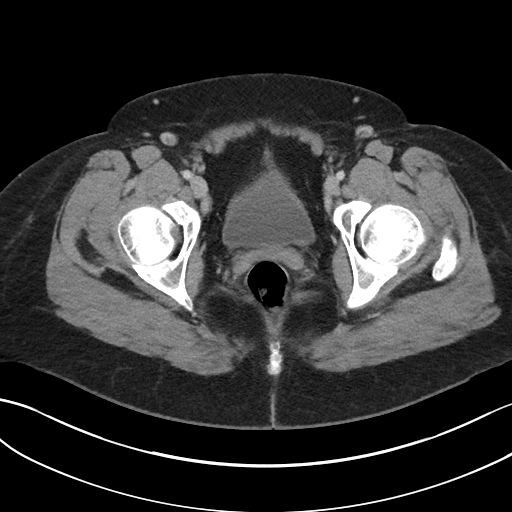
[im 20/95  soft-tissue]
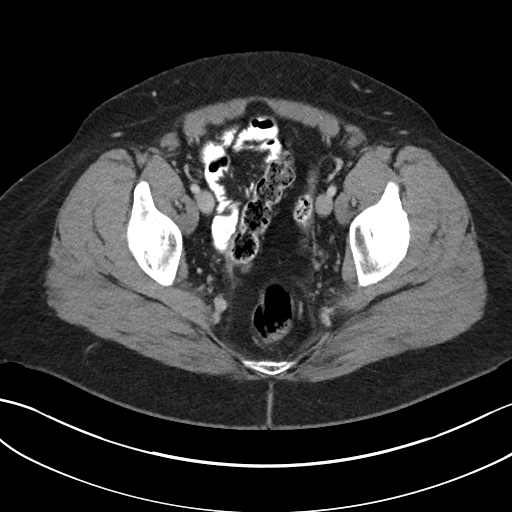
[im 25/95  soft-tissue]
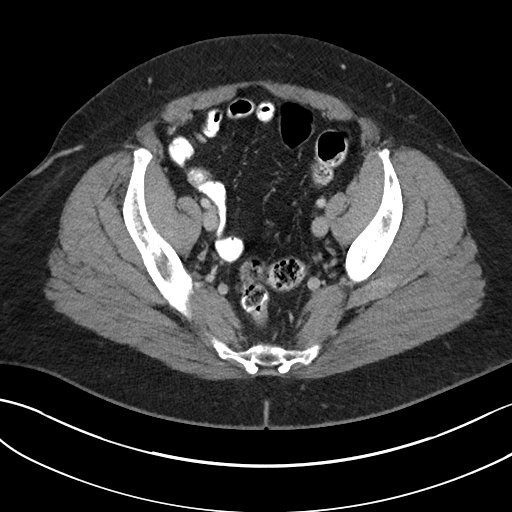
[im 35/95  soft-tissue]
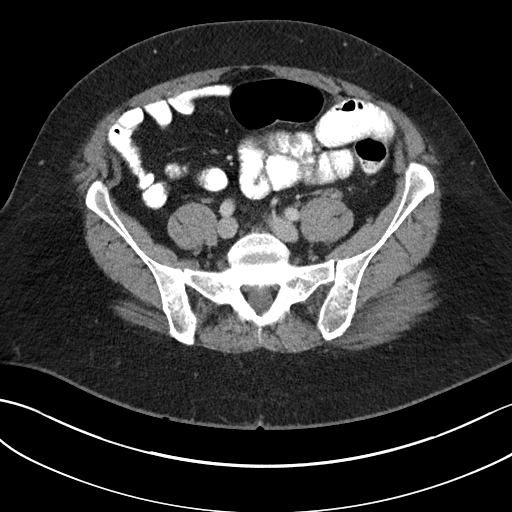
[im 40/95  soft-tissue]
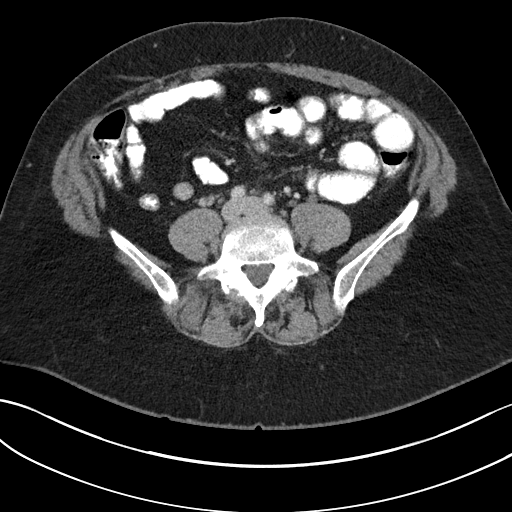
[im 50/95  soft-tissue]
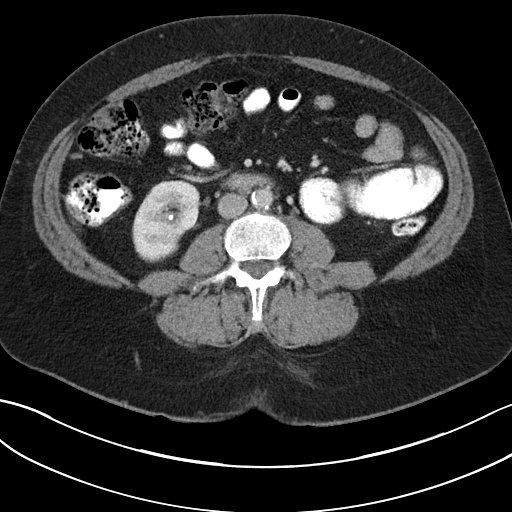
[im 55/95  soft-tissue]
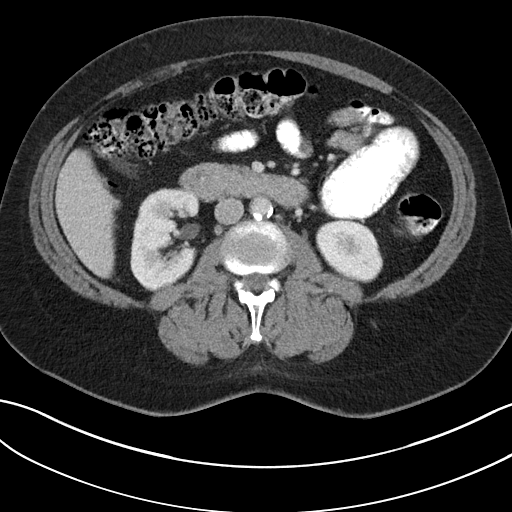
[im 60/95  soft-tissue]
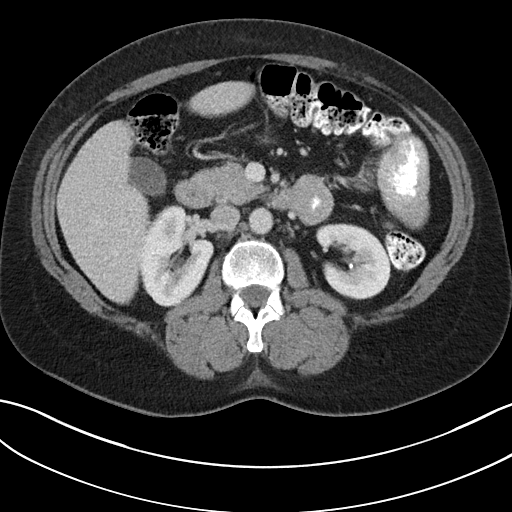
[im 60/95  bone]
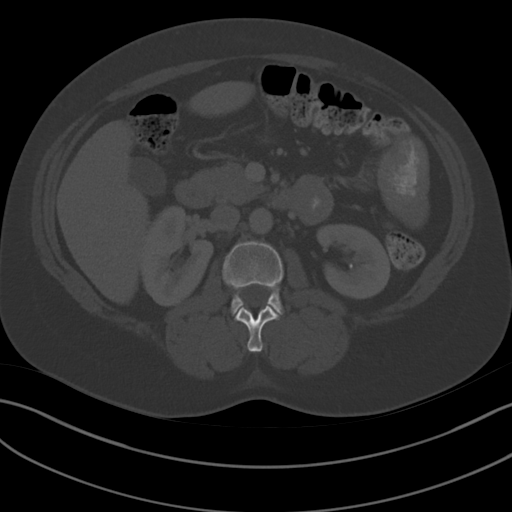
[im 70/95  soft-tissue]
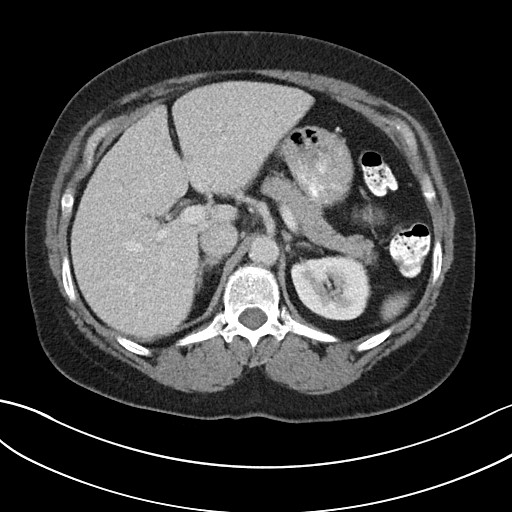
[im 75/95  soft-tissue]
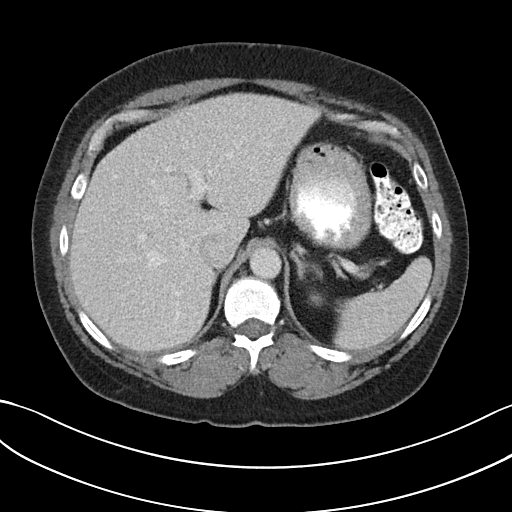
[im 80/95  soft-tissue]
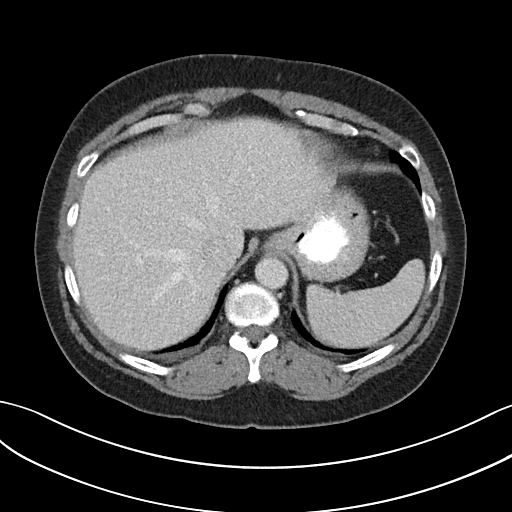
[im 90/95  soft-tissue]
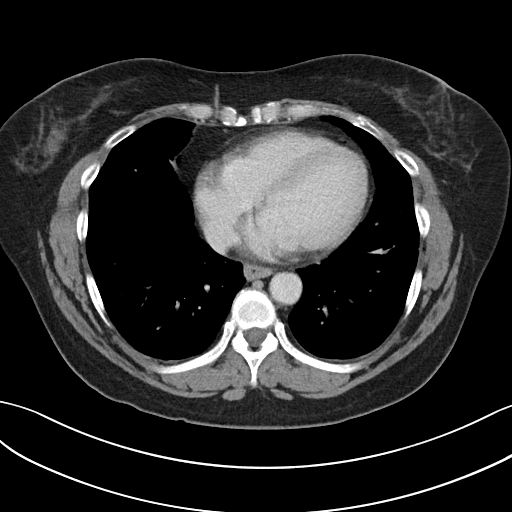

[Series 5: abd/pel w st · coronal · 0.79mm/px · 3 of 104 slices shown]
[im 35/104  soft-tissue]
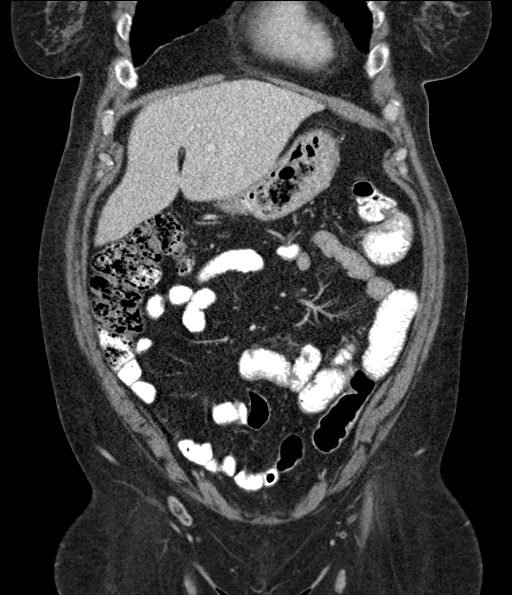
[im 46/104  soft-tissue]
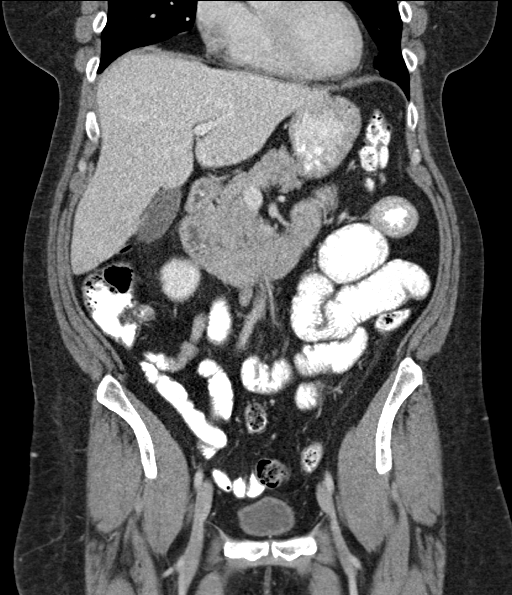
[im 58/104  soft-tissue]
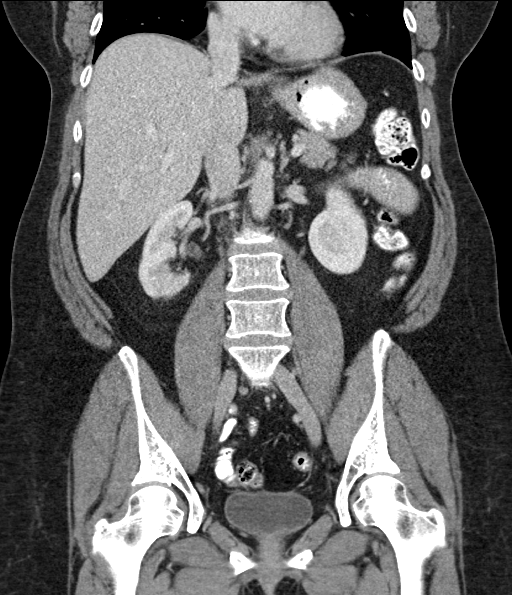

[16 of 46 positions shown; findings below may reference images not displayed]

FINDINGS: Lower Chest: No acute findings.

Hepatobiliary: No hepatic masses identified. Gallbladder is
unremarkable.

Pancreas:  No mass or inflammatory changes.

Spleen: Within normal limits in size and appearance.

Adrenals/Urinary Tract: No masses identified. Several small less
than 5 mm bilateral renal calculi are seen. No evidence of ureteral
calculi or hydronephrosis. Unremarkable unopacified urinary bladder.

Stomach/Bowel: Moderately dilated proximal small bowel loops are
seen with transition point in the left lower abdomen. This is
consistent with a partial small bowel obstruction, and may be due to
adhesion. Small bowel wall thickening is seen involving the proximal
jejunum, suspicious for enteritis. No evidence of pneumatosis.

Vascular/Lymphatic: No pathologically enlarged lymph nodes. No
abdominal aortic aneurysm. Aortic atherosclerosis.

Reproductive: Prior hysterectomy noted. Adnexal regions are
unremarkable in appearance.

Other:  None.

Musculoskeletal:  No suspicious bone lesions identified.
IMPRESSION: 1. Partial small bowel obstruction, with transition point in left
lower abdomen. This may be due to adhesion.
2. Small bowel wall thickening involving proximal jejunum,
suspicious for enteritis. No evidence of pneumatosis or free air.
3. Bilateral nephrolithiasis. No evidence of ureteral calculi or
hydronephrosis.

Aortic Atherosclerosis (BB721-2Y7.7).

## 2020-06-05 ENCOUNTER — Ambulatory Visit (INDEPENDENT_AMBULATORY_CARE_PROVIDER_SITE_OTHER): Payer: BC Managed Care – PPO | Admitting: Family Medicine

## 2020-06-05 ENCOUNTER — Other Ambulatory Visit: Payer: Self-pay

## 2020-06-05 ENCOUNTER — Encounter: Payer: Self-pay | Admitting: Family Medicine

## 2020-06-05 DIAGNOSIS — G43001 Migraine without aura, not intractable, with status migrainosus: Secondary | ICD-10-CM

## 2020-06-05 DIAGNOSIS — E669 Obesity, unspecified: Secondary | ICD-10-CM

## 2020-06-05 DIAGNOSIS — J449 Chronic obstructive pulmonary disease, unspecified: Secondary | ICD-10-CM | POA: Diagnosis not present

## 2020-06-05 MED ORDER — METHOCARBAMOL 750 MG PO TABS: 750.0000 mg | ORAL_TABLET | Freq: Four times a day (QID) | ORAL | 11 refills | Status: DC

## 2020-06-05 MED ORDER — IBUPROFEN 800 MG PO TABS: 800.0000 mg | ORAL_TABLET | Freq: Three times a day (TID) | ORAL | 11 refills | Status: DC | PRN

## 2020-06-05 MED ORDER — TOPIRAMATE 25 MG PO TABS: 25.0000 mg | ORAL_TABLET | Freq: Two times a day (BID) | ORAL | 3 refills | Status: DC

## 2020-06-05 NOTE — Assessment & Plan Note (Signed)
Reviewed the importance of eating for weight loss.  Goals made today include reducing Mountain Dew's gradually with a goal of 1/week.  She also is going to work on eating more vegetables with the goal of eating half of her dinner and lunch as vegetables.

## 2020-06-05 NOTE — Progress Notes (Signed)
° ° °  SUBJECTIVE:   CHIEF COMPLAINT / HPI:   Weight gain Patient reports that despite walking about 11,000 steps per day at work, she continues to gain weight.  She does say that she drinks 2-3 small Mountain Dew's per day and does not eat carefully for the most part.  She is interested in losing weight because she knows that it will likely help improve her back pain.  Headaches Occur in frontal area of head behind eyes Occur 2-3 times per week, last all day Both throbbing and constant Continues to take propranolol 60 mg once daily, which was recently decreased by another doctor due to her concurrent COPD Takes ibuprofen when she first feels a headache, which seems to help minimally   PERTINENT  PMH / PSH: COPD, chronic back pain, obesity, migraine  OBJECTIVE:   BP 135/80    Pulse 62    Wt 211 lb (95.7 kg)    LMP  (LMP Unknown) Comment: on Megace   SpO2 96%    BMI 36.22 kg/m   General: well appearing, appears stated age Cardiac: RRR, no MRG Respiratory: Reduced air movement, but no wheezing or increased work of breathing Skin: no rashes or other lesions, warm and well perfused Psych: appropriate mood and affect    ASSESSMENT/PLAN:   Migraine without aura and with status migrainosus, not intractable Since patient is allergic to sumatriptan can and we should not increase her propranolol due to her COPD and pulse of 62 today, would like to start Topamax 25 mg twice daily.  Will keep this dose for the first week then increase to 50 mg twice daily.  Patient will need an appointment in order to further increase this medication if needed.  Reviewed with the patient that success in prophylaxis for migraines means a 50% reduction in severity or frequency, not a complete cure.  Also reviewed with her that she will need to try this medication for several months prior to determining whether it is successful or not.  Reviewed possible side effects of Topamax, including mental slowing.  COPD  suggested by initial evaluation [facility]) Would like patient to have pulmonary function testing performed now that this is an option at our clinic.  Imaging has supported COPD diagnosis, but would like to have official testing as well.  Will continue Spiriva.  Obesity (BMI 35.0-39.9 without comorbidity) Reviewed the importance of eating for weight loss.  Goals made today include reducing Mountain Dew's gradually with a goal of 1/week.  She also is going to work on eating more vegetables with the goal of eating half of her dinner and lunch as vegetables.     Kathrene Alu, MD [place]

## 2020-06-05 NOTE — Patient Instructions (Addendum)
It was nice seeing you today Mariah Rice!  Pulmonary function testing appointment at the front desk today.  We are starting Topamax for your migraine headaches.  Please take 1 tablet twice daily for the first week then increase to 2 tablets twice daily.  We are hoping that this medication will decrease either the intensity or the frequency of your headaches by 50%, and we need to try it for a few months before we decide whether it works well for you or not.  Please let me know if you have any difficulties picking up this medication.  I have refilled your ibuprofen 800 mg and ordered Robaxin 750 mg.  Work on [employer] and eating more vegetables daily.  Continue doing a great job with your activity level.  If you have any questions or concerns, please feel free to call the clinic.   Be well,  Mariah Rice

## 2020-06-05 NOTE — Assessment & Plan Note (Signed)
Would like patient to have pulmonary function testing performed now that this is an option at our clinic.  Imaging has supported COPD diagnosis, but would like to have official testing as well.  Will continue Spiriva.

## 2020-06-05 NOTE — Assessment & Plan Note (Signed)
Since patient is allergic to sumatriptan can and we should not increase her propranolol due to her COPD and pulse of 62 today, would like to start Topamax 25 mg twice daily.  Will keep this dose for the first week then increase to 50 mg twice daily.  Patient will need an appointment in order to further increase this medication if needed.  Reviewed with the patient that success in prophylaxis for migraines means a 50% reduction in severity or frequency, not a complete cure.  Also reviewed with her that she will need to try this medication for several months prior to determining whether it is successful or not.  Reviewed possible side effects of Topamax, including mental slowing.

## 2020-07-03 ENCOUNTER — Encounter: Payer: Self-pay | Admitting: Pharmacist

## 2020-07-03 ENCOUNTER — Ambulatory Visit (INDEPENDENT_AMBULATORY_CARE_PROVIDER_SITE_OTHER): Payer: BC Managed Care – PPO | Admitting: Pharmacist

## 2020-07-03 ENCOUNTER — Other Ambulatory Visit: Payer: Self-pay

## 2020-07-03 DIAGNOSIS — J449 Chronic obstructive pulmonary disease, unspecified: Secondary | ICD-10-CM | POA: Diagnosis not present

## 2020-07-03 DIAGNOSIS — Z720 Tobacco use: Secondary | ICD-10-CM

## 2020-07-03 MED ORDER — SPIRIVA RESPIMAT 2.5 MCG/ACT IN AERS: 2.0000 | INHALATION_SPRAY | Freq: Every day | RESPIRATORY_TRACT | 11 refills | Status: DC

## 2020-07-03 NOTE — Progress Notes (Signed)
   S:  Patient arrives for evaluation/assistance with tobacco dependence in good spirits. Patient has comorbid COPD and would like to quit smoking so that she can breath better and spot paying monthly $50 insurance payment for smoking status. She reports cutting back on smoking, now down to 6 cigarettes/day from 1.5 packs daily and states that new-start Topiramate has helped - "makes the cigarettes taste different". Patient was referred and last seen by Primary Care Provider on 04/05/20.    Age when started using tobacco on a daily basis 49 years old. Number of cigarettes/day: down from 1.5 packs/day to 6 cigarettes/day. Started vaping ~2 months ago - reports vaping 2-3 times daily. Estimated nicotine content per cigarette~ 1mg .  Estimated nicotine intake per day 6 mg.   Denies waking to smoke  Most recent quit attempt 8 years ago. Longest time ever been tobacco free 2 weeks 8 years ago when patient was hospitalized for a tooth abcess.  Medications used in past cessation efforts include: nicotine patches, lozenges, gum. Currently taking topiramate.   Rates IMPORTANCE of quitting tobacco on 1-10 scale of 10.  Most common triggers to use tobacco include; after meals, at night-time.  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 Set short-term goal of cutting down to 3  cigarettes/day by September [birth year]th (patient's birthday) and completely quitting cigarettes and vaping by Dec 16, 2020.  -Contuinue topiramate. Patient counseled on purpose, proper use, and potential adverse effects, including slower onset compared to vaping.   -Provided information on 1 800-QUIT NOW support program.   COPD - stable - no spirometry today.  Agreed to reconsider spirometry in early 2022 if she is able to quit smoking completely.   - Switch Spiriva Handihaler to Respimat at patient request.  New prescription. Patient educated on purpose, proper use and potential adverse effects of dry mouth.  Following instruction patient verbalized understanding of treatment plan.    Denies any headaches since starting topiramate - continue topiramate at current dose as this may also help with tobacco cessation attempt.   Written information provided.  F/U phone call September 2021 (around patient's birthday - [birth year].    Total time in face-to-face counseling 40 minutes.  Patient seen with Laurey Arrow, PharmD Candidate.

## 2020-07-03 NOTE — Patient Instructions (Addendum)
Great to see you again today.   Please work hard to decrease your cigarette cigarette intake to 3 cigarettes or less by your birthday in September.   Goal is to quit smoking and vaping by the end of 2021   New prescription for Respimat Spiriva sent to your pharmacy today.   Plan to follow - in early 2022 for breathing test after you completely quit smoking and vaping.    Please call us if there is anything we can do to help.  Consider using the 1800 quit now tobacco cessation support counseling visit.

## 2020-07-03 NOTE — Assessment & Plan Note (Signed)
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 Set short-term goal of cutting down to 3 cigarettes/day by September [birth year]th (patient's birthday) and completely quitting cigarettes and vaping by Dec 16, 2020.  -Contuinue topiramate. Patient counseled on purpose, proper use, and potential adverse effects, including slower onset compared to vaping.   -Provided information on 1 800-QUIT NOW support program.

## 2020-07-03 NOTE — Progress Notes (Signed)
Reviewed: I agree with Dr. Koval's documentation and management. 

## 2020-07-17 ENCOUNTER — Ambulatory Visit (INDEPENDENT_AMBULATORY_CARE_PROVIDER_SITE_OTHER): Payer: BC Managed Care – PPO | Admitting: Family Medicine

## 2020-07-17 ENCOUNTER — Other Ambulatory Visit: Payer: Self-pay

## 2020-07-17 VITALS — BP 122/78 | HR 66 | Ht 64.0 in | Wt 207.0 lb

## 2020-07-17 DIAGNOSIS — J441 Chronic obstructive pulmonary disease with (acute) exacerbation: Secondary | ICD-10-CM | POA: Diagnosis not present

## 2020-07-17 DIAGNOSIS — Z720 Tobacco use: Secondary | ICD-10-CM

## 2020-07-17 DIAGNOSIS — F39 Unspecified mood [affective] disorder: Secondary | ICD-10-CM | POA: Diagnosis not present

## 2020-07-17 HISTORY — DX: Chronic obstructive pulmonary disease with (acute) exacerbation: J44.1

## 2020-07-17 MED ORDER — PREDNISONE 20 MG PO TABS: 40.0000 mg | ORAL_TABLET | Freq: Every day | ORAL | 0 refills | Status: AC

## 2020-07-17 MED ORDER — AZITHROMYCIN 250 MG PO TABS: ORAL_TABLET | ORAL | 0 refills | Status: DC

## 2020-07-17 NOTE — Assessment & Plan Note (Addendum)
Smoking cessation instruction/counseling given:  counseled patient on the dangers of tobacco use, advised patient to stop smoking, and reviewed strategies to maximize success. Patient recently evaluated by Dr. Valentina Lucks, PharmD.  Plans for possible PFTs next year.

## 2020-07-17 NOTE — Assessment & Plan Note (Signed)
Patient states she would like her [facility] to prescribe Trileptal and Vraylar.  Per Jan 2021 note, Beverly Sessions was prescribing these medications.  Patient to schedule meeting with Dr. Maudie Mercury (new PCP) to discuss further.

## 2020-07-17 NOTE — Assessment & Plan Note (Signed)
Continue Spirva.  Will treat exacerbation with 5-day course of prednisone and Azithromycin.  - ED precautions given - Follow up if symptoms not improving

## 2020-07-17 NOTE — Patient Instructions (Signed)
It was great seeing you today!  Make an appointment to meet Dr. Maudie Mercury and discuss your mental health medications.  Call us if: antibiotics and steroids are not improving your symptoms    Visit Remembers: - Stop by the pharmacy to pick up your prescriptions  - Continue to work on your healthy eating habits and incorporating exercise into your daily life.  - Your goal is to have an BP < 120/80 - New medications: Take 500 mg Azithromycin and 40 mg steroids for 5 days.    Please bring all of your medications with you to each visit.    If you haven't already, sign up for My Chart to have easy access to your labs results, and communication with your primary care physician.  Feel free to call with any questions or concerns at any time, at [phone].   Take care,  Dr. Rushie [facility]

## 2020-07-17 NOTE — Progress Notes (Signed)
   SUBJECTIVE:   CHIEF COMPLAINT / HPI:   Chief Complaint  Patient presents with  . COPD  . Foot Swelling     Mariah Rice is a 49 y.o. female here for productive cough for the past week.    COPD  Patient recently saw Dr. Valentina Lucks for PFTs and was switched to [facility]. 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  Patient states she has been having to use her Albuterol inhaler 3 times a day over the past week and she usually doesn't have to use it that often.  Endorses increased cough with non-colored, non-malodorous sputum production.  Denies hemoptysis or recent sick contacts.  There has been no fever or sore throat.   Tobacco abuse Patient smokes 1/4 pk of  Cigarettes now whereas she use to smoke 1.5 ppd.  She is working to quit smoking.     PERTINENT  PMH / PSH: reviewed and updated as appropriate   OBJECTIVE:   BP 122/78   Pulse 66   Ht 5\' 4"  (1.626 m)   Wt (!) 207 lb (93.9 kg)   LMP  (LMP Unknown) Comment: on Megace  SpO2 97%   BMI 35.53 kg/m    GEN: well developed female,  in no acute distress CV: regular rate and rhythm, no murmurs appreciated RESP: no increased work of breathing, clear to ascultation bilaterally  MSK: trace LE edema, no calf tenderness  SKIN: warm, dry, well perfused  NEURO: grossly normal, moves all extremities appropriately PSYCH: Normal affect, appropriate speech and behavior    ASSESSMENT/PLAN:   COPD exacerbation ([place]) Continue Spirva.  Will treat exacerbation with 5-day course of prednisone and Azithromycin.  - ED precautions given - Follow up if symptoms not improving   Mood disorder [facility]) Patient states she would like her [facility] to prescribe Trileptal and Vraylar.  Per Jan 2021 note, Beverly Sessions was prescribing these medications.  Patient to schedule meeting with Dr. Maudie Mercury (new PCP) to discuss further.    Tobacco  abuse Smoking cessation instruction/counseling given:  counseled patient on the dangers of tobacco use, advised patient to stop smoking, and reviewed strategies to maximize success.      Depression screen [facility] 2/9 07/17/2020 06/05/2020 04/05/2020  Decreased Interest 0 1 1  Down, Depressed, Hopeless 1 1 0  PHQ - 2 Score 1 2 1   Altered sleeping 2 - -  Tired, decreased energy 2 - -  Change in appetite 0 - -  Feeling bad or failure about yourself  0 - -  Trouble concentrating 0 - -  Moving slowly or fidgety/restless 1 - -  Suicidal thoughts 0 - -  PHQ-9 Score 6 - -  Difficult doing work/chores - - -  Some recent data might be hidden      Lyndee Hensen, DO PGY-2, [facility] 07/17/2020

## 2020-07-19 ENCOUNTER — Encounter: Payer: Self-pay | Admitting: Family Medicine

## 2020-07-20 ENCOUNTER — Emergency Department (HOSPITAL_COMMUNITY)
Admission: EM | Admit: 2020-07-20 | Discharge: 2020-07-20 | Disposition: A | Discharge status: Home / Self Care | Payer: BC Managed Care – PPO | Attending: Emergency Medicine | Admitting: Emergency Medicine

## 2020-07-20 ENCOUNTER — Emergency Department (HOSPITAL_COMMUNITY): Payer: BC Managed Care – PPO

## 2020-07-20 ENCOUNTER — Other Ambulatory Visit: Payer: Self-pay

## 2020-07-20 ENCOUNTER — Encounter (HOSPITAL_COMMUNITY): Payer: Self-pay | Admitting: Emergency Medicine

## 2020-07-20 DIAGNOSIS — J449 Chronic obstructive pulmonary disease, unspecified: Secondary | ICD-10-CM | POA: Diagnosis not present

## 2020-07-20 DIAGNOSIS — Z880 Allergy status to penicillin: Secondary | ICD-10-CM | POA: Insufficient documentation

## 2020-07-20 DIAGNOSIS — Z79899 Other long term (current) drug therapy: Secondary | ICD-10-CM | POA: Insufficient documentation

## 2020-07-20 DIAGNOSIS — T782XXA Anaphylactic shock, unspecified, initial encounter: Secondary | ICD-10-CM | POA: Insufficient documentation

## 2020-07-20 DIAGNOSIS — I10 Essential (primary) hypertension: Secondary | ICD-10-CM | POA: Insufficient documentation

## 2020-07-20 DIAGNOSIS — F17210 Nicotine dependence, cigarettes, uncomplicated: Secondary | ICD-10-CM | POA: Diagnosis not present

## 2020-07-20 DIAGNOSIS — T7840XA Allergy, unspecified, initial encounter: Secondary | ICD-10-CM | POA: Diagnosis present

## 2020-07-20 LAB — CBC WITH DIFFERENTIAL/PLATELET
Abs Immature Granulocytes: 0.08 10*3/uL — ABNORMAL HIGH (ref 0.00–0.07)
Basophils Absolute: 0.1 10*3/uL (ref 0.0–0.1)
Basophils Relative: 0 %
Eosinophils Absolute: 0.1 10*3/uL (ref 0.0–0.5)
Eosinophils Relative: 1 %
HCT: 41 % (ref 36.0–46.0)
Hemoglobin: 13.7 g/dL (ref 12.0–15.0)
Immature Granulocytes: 1 %
Lymphocytes Relative: 32 %
Lymphs Abs: 3.6 10*3/uL (ref 0.7–4.0)
MCH: 31.4 pg (ref 26.0–34.0)
MCHC: 33.4 g/dL (ref 30.0–36.0)
MCV: 93.8 fL (ref 80.0–100.0)
Monocytes Absolute: 0.6 10*3/uL (ref 0.1–1.0)
Monocytes Relative: 6 %
Neutro Abs: 7 10*3/uL (ref 1.7–7.7)
Neutrophils Relative %: 60 %
Platelets: 284 10*3/uL (ref 150–400)
RBC: 4.37 MIL/uL (ref 3.87–5.11)
RDW: 13.5 % (ref 11.5–15.5)
WBC: 11.5 10*3/uL — ABNORMAL HIGH (ref 4.0–10.5)
nRBC: 0 % (ref 0.0–0.2)

## 2020-07-20 LAB — BASIC METABOLIC PANEL
Anion gap: 10 (ref 5–15)
BUN: 17 mg/dL (ref 6–20)
CO2: 24 mmol/L (ref 22–32)
Calcium: 8.6 mg/dL — ABNORMAL LOW (ref 8.9–10.3)
Chloride: 97 mmol/L — ABNORMAL LOW (ref 98–111)
Creatinine, Ser: 0.7 mg/dL (ref 0.44–1.00)
GFR calc Af Amer: >60 mL/min (ref >60)
GFR calc non Af Amer: >60 mL/min (ref >60)
Glucose, Bld: 103 mg/dL — ABNORMAL HIGH (ref 70–99)
Potassium: 3.4 mmol/L — ABNORMAL LOW (ref 3.5–5.1)
Sodium: 131 mmol/L — ABNORMAL LOW (ref 135–145)

## 2020-07-20 LAB — TROPONIN I (HIGH SENSITIVITY)
Troponin I (High Sensitivity): 3 ng/L (ref <18)
Troponin I (High Sensitivity): 3 ng/L (ref <18)

## 2020-07-20 MED ORDER — EPINEPHRINE 0.3 MG/0.3ML IJ SOAJ
INTRAMUSCULAR | Status: AC
  Filled 2020-07-20: qty 0.3

## 2020-07-20 MED ORDER — EPINEPHRINE 0.3 MG/0.3ML IJ SOAJ
0.3000 mg | INTRAMUSCULAR | 0 refills | Status: DC | PRN
Start: 2020-07-20 — End: 2021-09-21

## 2020-07-20 MED ORDER — EPINEPHRINE 0.3 MG/0.3ML IJ SOAJ
0.3000 mg | Freq: Once | INTRAMUSCULAR | Status: AC
  Administered 2020-07-20: 0.3 mg via INTRAMUSCULAR

## 2020-07-20 MED ORDER — METHYLPREDNISOLONE SODIUM SUCC 125 MG IJ SOLR
125.0000 mg | Freq: Once | INTRAMUSCULAR | Status: AC
  Administered 2020-07-20: 125 mg via INTRAVENOUS
  Filled 2020-07-20: qty 2

## 2020-07-20 MED ORDER — EPINEPHRINE 0.3 MG/0.3ML IJ SOAJ: 0.3000 mg | Freq: Once | INTRAMUSCULAR | Status: DC

## 2020-07-20 MED ORDER — DIPHENHYDRAMINE HCL 50 MG/ML IJ SOLN
25.0000 mg | Freq: Once | INTRAMUSCULAR | Status: AC
  Administered 2020-07-20: 25 mg via INTRAVENOUS
  Filled 2020-07-20: qty 1

## 2020-07-20 MED ORDER — FAMOTIDINE IN NACL 20-0.9 MG/50ML-% IV SOLN
20.0000 mg | Freq: Once | INTRAVENOUS | Status: AC
  Administered 2020-07-20: 20 mg via INTRAVENOUS
  Filled 2020-07-20: qty 50

## 2020-07-20 MED ORDER — DIPHENHYDRAMINE HCL 25 MG PO CAPS
25.0000 mg | ORAL_CAPSULE | Freq: Four times a day (QID) | ORAL | 0 refills | Status: DC | PRN
Start: 2020-07-20 — End: 2020-08-11

## 2020-07-20 NOTE — Discharge Instructions (Signed)
Stop taking azithromycin and do not take it in the future.  Continue the steroids as prescribed.  You may use albuterol as needed for difficulty breathing.  He may also use Benadryl as needed for itching.  If you use the epinephrine pen you must come to the hospital afterwards to be observed.

## 2020-07-20 NOTE — ED Triage Notes (Signed)
Pt c/o feeling like her throat is swelling up and itching that started last night. States she just started a new medication. EDP at bedside.

## 2020-07-20 NOTE — ED Provider Notes (Signed)
[facility] EMERGENCY DEPARTMENT Provider Note   CSN: [id number] Arrival date & time: 07/20/20  0541     History Chief Complaint  Patient presents with  . Allergic Reaction    Mariah Rice is a 49 y.o. female.  Level 5 caveat for acuity of condition.  Patient here with what she suspects is allergic reaction.  States she is had tightness and swelling in her throat with diffuse body itching and shortness of breath since last night.  She was prescribed prednisone and azithromycin for COPD exacerbation by her doctor 2 days ago.  She has had 2 doses.  States she has had prednisone in the past but never azithromycin.  She has a known sulfa allergy and penicillin allergy.  She believes he is having reaction to the azithromycin.  She describes tightness in her throat and difficulty breathing with itching all over with been ongoing all night and progressively worsening.  There is tightness in her chest and tightness in her throat.  She had an itchy red rash which started to improve.  Did take some Benadryl at home without relief. Nausea but no vomiting.  No diarrhea.  No tongue or lip swelling Does not believe she is had azithromycin in the past.  Denies any other other new exposures.  No new clothes, cleaning products, foods.  The history is provided by the patient. The history is limited by the condition of the patient.  Allergic Reaction Presenting symptoms: rash        Past Medical History:  Diagnosis Date  . Anxiety   . COPD (chronic obstructive pulmonary disease) ([place])   . Dental abscess   . Depression   . Family history of breast cancer   . Family history of ovarian cancer   . GERD (gastroesophageal reflux disease)   . Headache    migraines  . Hypertension   . Hyperthyroidism 1990s  . PONV (postoperative nausea and vomiting)   . Shortness of breath dyspnea    with anxiety    Patient Active Problem List   Diagnosis Date Noted  . COPD exacerbation ([place]) 07/17/2020  .  Anxiety and depression   . Small bowel obstruction ([place])   . Abnormal finding on GI tract imaging   . Genetic testing 03/08/2019  . COPD suggested by initial evaluation ([place]) 02/15/2019  . Family history of breast cancer 01/26/2019  . Chronic bilateral low back pain without sciatica 06/06/2018  . Prediabetes 05/31/2018  . Migraine without aura and with status migrainosus, not intractable 11/30/2017  . Tick bite of abdomen 05/13/2017  . Rosacea 10/14/2016  . Right shoulder pain 10/06/2016  . Mood disorder ([place]) 10/06/2016  . GERD (gastroesophageal reflux disease) 10/19/2015  . H/O bilateral oophorectomy 08/13/2015  . S/P laparoscopic assisted vaginal hysterectomy (LAVH) 07/11/2015  . GAD (generalized anxiety disorder) 01/06/2013  . Tobacco abuse 11/10/2012  . Obesity (BMI 35.0-39.9 without comorbidity) 11/10/2012  . Hypertension 11/10/2012    Past Surgical History:  Procedure Laterality Date  . ENTEROSCOPY N/A 05/12/2019   Procedure: ENTEROSCOPY;  Surgeon: Doran Stabler, MD;  Location: [facility];  Service: Gastroenterology;  Laterality: N/A;  . INCISE AND DRAIN ABCESS  2005  . LAPAROSCOPIC ASSISTED VAGINAL HYSTERECTOMY N/A 07/11/2015   Procedure: LAPAROSCOPIC ASSISTED VAGINAL HYSTERECTOMY;  Surgeon: Osborne Oman, MD;  Location: [facility];  Service: Gynecology;  Laterality: N/A;  . LAPAROSCOPIC BILATERAL SALPINGECTOMY Bilateral 07/11/2015   Procedure: LAPAROSCOPIC BILATERAL SALPINGO OOPHORECTOMY ;  Surgeon: Osborne Oman, MD;  Location:  [facility];  Service: Gynecology;  Laterality: Bilateral;  . tubal ligation  2000  . WISDOM TOOTH EXTRACTION       OB History    Gravida  4   Para  2   Term  2   Preterm      AB  2   Living  2     SAB  2   TAB      Ectopic      Multiple      Live Births              Family History  Problem Relation Age of Onset  . Ovarian cancer Mother 37       d. 20  . Heart disease Father   . Hypertension Father   . COPD  Father   . Hypertension Paternal Aunt 19  . Lung cancer Paternal Aunt 74  . Breast cancer Paternal Aunt 74  . Thyroid disease Maternal Grandmother   . Lymphoma Maternal Grandmother 61       NHL, recurrance at 62  . Lung cancer Maternal Grandmother 48  . Hypertension Paternal Grandmother   . Heart disease Paternal Grandmother   . Heart Problems Paternal Grandmother   . Dementia Paternal Grandmother   . Colon cancer Neg Hx   . Esophageal cancer Neg Hx   . Stomach cancer Neg Hx   . Pancreatic cancer Neg Hx     Social History   Tobacco Use  . Smoking status: Current Every Day Smoker    Packs/day: 0.50    Years: 30.00    Pack years: 15.00    Types: Cigarettes    Start date: 12/16/1986  . Smokeless tobacco: Never Used  . Tobacco comment: Smoked 1 ppd for 25 years  Vaping Use  . Vaping Use: Never used  Substance Use Topics  . Alcohol use: Not Currently    Alcohol/week: 0.0 standard drinks  . Drug use: No    Home Medications Prior to Admission medications   Medication Sig Start Date End Date Taking? Authorizing Provider  albuterol (VENTOLIN HFA) 108 (90 Base) MCG/ACT inhaler Inhale 2 puffs into the lungs every 6 (six) hours as needed for wheezing or shortness of breath. Patient not taking: Reported on 07/03/2020 02/11/20   Kathrene Alu, MD  amLODipine (NORVASC) 5 MG tablet Take 1 tablet (5 mg total) by mouth daily. 02/11/20   Kathrene Alu, MD  azithromycin (ZITHROMAX) 250 MG tablet Take 2 tablets daily for 5 days. 07/17/20   Lyndee Hensen, DO  Cariprazine HCl (VRAYLAR) 4.5 MG CAPS Take 4.5 mg by mouth every morning.    [provider]  estradiol (ESTRACE) 2 MG tablet TAKE 1 TABLET BY MOUTH EVERY DAY 04/24/20   Kathrene Alu, MD  ibuprofen (ADVIL) 800 MG tablet Take 1 tablet (800 mg total) by mouth every 8 (eight) hours as needed. Patient not taking: Reported on 07/03/2020 06/05/20   Kathrene Alu, MD  methocarbamol (ROBAXIN-750) 750 MG tablet Take 1 tablet  (750 mg total) by mouth 4 (four) times daily. 06/05/20   Kathrene Alu, MD  omeprazole (PRILOSEC) 20 MG capsule Take 1 capsule (20 mg total) by mouth daily. 02/11/20   Kathrene Alu, MD  oxcarbazepine (TRILEPTAL) 600 MG tablet Take 600 mg by mouth 2 (two) times daily.     [provider]  pravastatin (PRAVACHOL) 40 MG tablet Take 1 tablet (40 mg total) by mouth daily. 02/11/20   Kathrene Alu,  MD  predniSONE (DELTASONE) 20 MG tablet Take 2 tablets (40 mg total) by mouth daily with breakfast for 5 days. 07/17/20 07/22/20  Lyndee Hensen, DO    Allergies    Meloxicam, Sumatriptan, Penicillins, and Sulfa antibiotics  Review of Systems   Review of Systems  Constitutional: Negative for activity change, appetite change, fatigue and fever.  HENT: Negative for congestion and rhinorrhea.   Respiratory: Positive for chest tightness and shortness of breath.   Gastrointestinal: Positive for nausea. Negative for abdominal pain and vomiting.  Musculoskeletal: Negative for arthralgias and myalgias.  Skin: Positive for rash.  Neurological: Negative for dizziness, weakness and headaches.   all other systems are negative except as noted in the HPI and PMH.    Physical Exam Updated Vital Signs BP (!) 142/93   Pulse (!) 58   Temp 98.4 F (36.9 C)   Resp 20   Ht 5\' 4"  (1.626 m)   Wt 93.9 kg   LMP  (LMP Unknown) Comment: on Megace  SpO2 95%   BMI 35.53 kg/m   Physical Exam Vitals and nursing note reviewed.  Constitutional:      General: She is in acute distress.     Appearance: She is well-developed.     Comments: Anxious  HENT:     Head: Normocephalic and atraumatic.     Mouth/Throat:     Pharynx: No oropharyngeal exudate.     Comments: Oropharynx appears normal.  No tongue or lip swelling, controlling secretions Eyes:     Conjunctiva/sclera: Conjunctivae normal.     Pupils: Pupils are equal, round, and reactive to light.  Neck:     Comments: No  meningismus. Cardiovascular:     Rate and Rhythm: Normal rate and regular rhythm.     Heart sounds: Normal heart sounds. No murmur heard.   Pulmonary:     Effort: Pulmonary effort is normal. No respiratory distress.     Breath sounds: Wheezing present.     Comments: Scattered expiratory wheezing Abdominal:     Palpations: Abdomen is soft.     Tenderness: There is no abdominal tenderness. There is no guarding or rebound.  Musculoskeletal:        General: No tenderness. Normal range of motion.     Cervical back: Normal range of motion and neck supple.  Skin:    General: Skin is warm.     Capillary Refill: Capillary refill takes less than 2 seconds.     Findings: Rash present.     Comments: Scattered erythematous patches to arms and legs and trunk.  Neurological:     General: No focal deficit present.     Mental Status: She is alert and oriented to person, place, and time. Mental status is at baseline.     Cranial Nerves: No cranial nerve deficit.     Motor: No abnormal muscle tone.     Coordination: Coordination normal.     Comments: No ataxia on finger to nose bilaterally. No pronator drift. 5/5 strength throughout. CN 2-12 intact.Equal grip strength. Sensation intact.   Psychiatric:        Behavior: Behavior normal.     ED Results / Procedures / Treatments   Labs (all labs ordered are listed, but only abnormal results are displayed) Labs Reviewed  CBC WITH DIFFERENTIAL/PLATELET  BASIC METABOLIC PANEL  TROPONIN I (HIGH SENSITIVITY)    EKG EKG Interpretation  Date/Time:  Thursday July 20 2020 05:56:08 EDT Ventricular Rate:  56 PR Interval:    QRS  Duration: 92 QT Interval:  404 QTC Calculation: 390 R Axis:   96 Text Interpretation: Sinus rhythm Anterior infarct, old No significant change was found Confirmed by Ezequiel Essex [phone]) on 07/20/2020 5:59:04 AM   Radiology DG Chest Portable 1 View  Result Date: 07/20/2020 CLINICAL DATA:  Shortness of breath. Throat  swelling and itching starting last night. Smoker EXAM: PORTABLE CHEST 1 VIEW COMPARISON:  02/23/2019 FINDINGS: Borderline heart size with normal pulmonary vascularity. No edema or consolidation in the lungs. Increased density in the right cardiophrenic angle is unchanged since prior study, corresponding to prominent fat pad on previous CT 04/23/2019. no pleural effusions. No pneumothorax. Mediastinal contours appear intact. IMPRESSION: No active disease. Electronically Signed   By: Lucienne Capers M.D.   On: 07/20/2020 06:19    Procedures .Critical Care Performed by: Ezequiel Essex, MD Authorized by: Ezequiel Essex, MD   Critical care provider statement:    Critical care time (minutes):  35   Critical care was necessary to treat or prevent imminent or life-threatening deterioration of the following conditions: anaphylaxis.   Critical care was time spent personally by me on the following activities:  Discussions with consultants, evaluation of patient's response to treatment, examination of patient, ordering and performing treatments and interventions, ordering and review of laboratory studies, ordering and review of radiographic studies, pulse oximetry, re-evaluation of patient's condition, obtaining history from patient or surrogate and review of old charts   (including critical care time)  Medications Ordered in ED Medications  EPINEPHrine (EPI-PEN) 0.3 mg/0.3 mL injection (has no administration in time range)  methylPREDNISolone sodium succinate (SOLU-MEDROL) 125 mg/2 mL injection 125 mg (has no administration in time range)  famotidine (PEPCID) IVPB 20 mg premix (has no administration in time range)  diphenhydrAMINE (BENADRYL) injection 25 mg (has no administration in time range)  EPINEPHrine (EPI-PEN) injection 0.3 mg (0.3 mg Intramuscular Given 07/20/20 0552)    ED Course  I have reviewed the triage vital signs and the nursing notes.  Pertinent labs & imaging results that were  available during my care of the patient were reviewed by me and considered in my medical decision making (see chart for details).    MDM Rules/Calculators/A&P                         Suspected allergic reaction to azithromycin.  She complains of chest tightness, throat tightness, shortness of breath and itching all over.  Also started on prednisone at the same time but has tolerated that before.  IM epinephrine given for throat tightness some perceived swelling.,  Steroids and antihistamines.  EKG is sinus rhythm.  On recheck, the patient is feeling improved.  States her throat is feeling better and her chest is feeling less tight.  She is not wheezing.  Added azithromycin to her allergy list.  Told not to take this medication again.  We will continue to observe in the ED 3 hours post epinephrine injection.  Anticipate discharge home with steroids and antihistamines as well as epinephrine pen with instructions.  Care to be transferred at shift change Final Clinical Impression(s) / ED Diagnoses Final diagnoses:  Anaphylaxis, initial encounter    Rx / DC Orders ED Discharge Orders    None       Orene Abbasi, Annie Main, MD 07/20/20 [phone]

## 2020-07-20 NOTE — ED Provider Notes (Signed)
49 year old comes in a chief complaint of allergic reaction.  Patient was seen by Dr. Wyvonnia Dusky. She has received EpiPen.  Plan was for her to be reassessed at 10:00, 4 hours post arrival.  On reassessment, patient reports that she is feeling better.  She is stable for discharge with prescription for EpiPen.  She has been advised to stop azithromycin.  She will start taking her prednisone starting tomorrow.  Strict ER return precautions discussed with the patient.   Mariah Biles, MD 07/20/20 1027

## 2020-07-21 ENCOUNTER — Ambulatory Visit: Payer: BC Managed Care – PPO | Admitting: Family Medicine

## 2020-07-26 ENCOUNTER — Ambulatory Visit (INDEPENDENT_AMBULATORY_CARE_PROVIDER_SITE_OTHER): Payer: BC Managed Care – PPO | Admitting: Student in an Organized Health Care Education/Training Program

## 2020-07-26 ENCOUNTER — Encounter: Payer: Self-pay | Admitting: Student in an Organized Health Care Education/Training Program

## 2020-07-26 ENCOUNTER — Other Ambulatory Visit: Payer: Self-pay

## 2020-07-26 DIAGNOSIS — T782XXD Anaphylactic shock, unspecified, subsequent encounter: Secondary | ICD-10-CM | POA: Diagnosis not present

## 2020-07-26 DIAGNOSIS — Z720 Tobacco use: Secondary | ICD-10-CM | POA: Diagnosis not present

## 2020-07-26 DIAGNOSIS — J441 Chronic obstructive pulmonary disease with (acute) exacerbation: Secondary | ICD-10-CM

## 2020-07-26 DIAGNOSIS — T782XXA Anaphylactic shock, unspecified, initial encounter: Secondary | ICD-10-CM

## 2020-07-26 HISTORY — DX: Anaphylactic shock, unspecified, initial encounter: T78.2XXA

## 2020-07-26 MED ORDER — VARENICLINE TARTRATE 0.5 MG PO TABS
0.5000 mg | ORAL_TABLET | Freq: Two times a day (BID) | ORAL | 1 refills | Status: DC
Start: 2020-07-26 — End: 2020-08-11

## 2020-07-26 MED ORDER — MOUTHWASH COMPOUNDING BASE PO LIQD: ORAL | 0 refills | Status: DC

## 2020-07-26 NOTE — Assessment & Plan Note (Signed)
Finished steroids for COPD exacerbation. Continues to have diffuse itching and pain in mouth and throat - magic mouthwash for swishing and swallowing on suspicion of esophageal candidiasis. Gave patient paper copy to bring to compounding pharmacy-30 mL viscous lidocaine 2%, 60 ml Maalox, 30 mls diphenhydramine 12.5 mg per 5 mL elixir, 40 ml Carafate 1 g per 10 ml. -Continue Benadryl -Return if not improved in 1 week

## 2020-07-26 NOTE — Assessment & Plan Note (Signed)
Patient desires to quit. Has been cutting back - chantix sent to pharmacy. Follow up with pcp in month

## 2020-07-26 NOTE — Patient Instructions (Signed)
It was a pleasure to see you today!  To summarize our discussion for this visit:  I have prescribed a magic mouthwash to be dispensed by a compounding pharmacy. This is the one I would recommend:  [facility]  [address], [place], [place] [address]  I have also prescribed chantix for smoking cessation. Please follow up with your PCP on continuing.   I would not recommend additional antibiotics for COPD at this time given your cough is not changed from your baseline.     Some additional health maintenance measures we should update are: Health Maintenance Due  Topic Date Due  . INFLUENZA VACCINE  07/16/2020    Call the clinic at [phone] if your symptoms worsen or you have any concerns.   Thank you for allowing me to take part in your care,  Dr. Doristine Mango

## 2020-07-26 NOTE — Progress Notes (Signed)
° ° °  SUBJECTIVE:   CHIEF COMPLAINT / HPI: f/u COPD exacerbation, anaphylaxis to tx  COPD exacerbation-patient was treated with steroids and azithromycin 8/2.  She has completed the steroid treatment.  Took two doses of the azithromycin but had anaphylactic reaction which was treated with epinephrine in the emergency department.  Has not taken any more antibiotics since that time and has not needed her EpiPen at home either.  She continues to have some diffuse itching which is only mildly improved with Benadryl.  Her cough is at baseline without any increased or change in sputum production.  She is also having pain with eating and swallowing in her mouth and throat.  Denies any fevers, rashes, nausea, vomiting, diarrhea, abdominal pain.  Tobacco use-patient is a current smoker but wants to quit.  She is open to trying therapy for this.  PERTINENT  PMH / PSH: Tobacco use  OBJECTIVE:   BP (!) 143/65    Pulse 67    Ht 5\' 4"  (1.626 m)    Wt 219 lb 9.6 oz (99.6 kg)    LMP  (LMP Unknown) Comment: on Megace   SpO2 98%    BMI 37.69 kg/m   General: NAD, pleasant, able to participate in exam HEENT: Poor dentition, negative for oral lesions or erythema.  Tongue with mild white film.  Unable to see exudates or lesions in pharynx. Extremities: Bilateral moderate nonpitting edema in ankles and feet Skin: warm and dry, no rashes noted Neuro: alert and oriented x4, no focal deficits Psych: Normal affect and mood  ASSESSMENT/PLAN:   COPD exacerbation (HCC) Continue chronic therapy Will not restart antibiotic at this time due to no change from baseline cough  Tobacco abuse Patient desires to quit. Has been cutting back - chantix sent to pharmacy. Follow up with pcp in month  Anaphylaxis Finished steroids for COPD exacerbation. Continues to have diffuse itching and pain in mouth and throat - magic mouthwash for swishing and swallowing on suspicion of esophageal candidiasis. Gave patient paper copy to  bring to compounding pharmacy-30 mL viscous lidocaine 2%, 60 ml Maalox, 30 mls diphenhydramine 12.5 mg per 5 mL elixir, 40 ml Carafate 1 g per 10 ml. -Continue Benadryl -Return if not improved in 1 week     Richarda Osmond, [place]

## 2020-07-26 NOTE — Assessment & Plan Note (Signed)
Continue chronic therapy Will not restart antibiotic at this time due to no change from baseline cough

## 2020-07-29 ENCOUNTER — Encounter: Payer: Self-pay | Admitting: Emergency Medicine

## 2020-07-29 ENCOUNTER — Other Ambulatory Visit: Payer: Self-pay

## 2020-07-29 ENCOUNTER — Ambulatory Visit
Admission: EM | Admit: 2020-07-29 | Discharge: 2020-07-29 | Disposition: A | Discharge status: Home / Self Care | Payer: BC Managed Care – PPO | Attending: Emergency Medicine | Admitting: Emergency Medicine

## 2020-07-29 DIAGNOSIS — J028 Acute pharyngitis due to other specified organisms: Secondary | ICD-10-CM | POA: Diagnosis not present

## 2020-07-29 DIAGNOSIS — B9789 Other viral agents as the cause of diseases classified elsewhere: Secondary | ICD-10-CM | POA: Diagnosis not present

## 2020-07-29 DIAGNOSIS — H6593 Unspecified nonsuppurative otitis media, bilateral: Secondary | ICD-10-CM | POA: Diagnosis not present

## 2020-07-29 DIAGNOSIS — Z1152 Encounter for screening for COVID-19: Secondary | ICD-10-CM | POA: Diagnosis not present

## 2020-07-29 MED ORDER — CEPACOL REGULAR STRENGTH 3 MG MT LOZG: 1.0000 | LOZENGE | OROMUCOSAL | 1 refills | Status: DC | PRN

## 2020-07-29 MED ORDER — FLUTICASONE PROPIONATE 50 MCG/ACT NA SUSP
1.0000 | Freq: Every day | NASAL | 0 refills | Status: DC
Start: 2020-07-29 — End: 2020-08-31

## 2020-07-29 NOTE — ED Triage Notes (Signed)
Ear pain and fullness x 1 week.  Also reports Swollen lymph nodes, feet and ankle swelling

## 2020-07-29 NOTE — Discharge Instructions (Signed)
COVID testing ordered.  It will take between 2-7 days for test results.  Someone will contact you regarding abnormal results.    In the meantime: You should remain isolated in your home for 10 days from symptom onset AND greater than 24 hours after symptoms resolution (absence of fever without the use of fever-reducing medication and improvement in respiratory symptoms), whichever is longer Get plenty of rest and push fluids Cepacol lozenges prescribed for sore throat Flonase for nasal congestion and runny nose Use medications daily for symptom relief Use OTC medications like ibuprofen or tylenol as needed fever or pain Call or go to the ED if you have any new or worsening symptoms such as fever, worsening cough, shortness of breath, chest tightness, chest pain, turning blue, changes in mental status, etc..Marland Kitchen

## 2020-07-29 NOTE — ED Provider Notes (Signed)
[facility]   [id number] 07/29/20 Arrival Time: 8527   CC: COVID symptoms  SUBJECTIVE: History from: patient.  Mariah Rice is a 49 y.o. female who presented to the urgent care for complaint of sore throat, ear pain and fullness for the past 1 week.  Denies sick exposure to COVID, flu or strep.  Denies recent travel.  Has tried OTC medication without relief.  Denies aggravating factors.  Denies  previous symptoms in the past.   Denies fever, chills, fatigue, sinus pain, rhinorrhea, sore throat, SOB, wheezing, chest pain, nausea, changes in bowel or bladder habits.     ROS: As per HPI.  All other pertinent ROS negative.      Past Medical History:  Diagnosis Date  . Anxiety   . COPD (chronic obstructive pulmonary disease) ([facility])   . Dental abscess   . Depression   . Family history of breast cancer   . Family history of ovarian cancer   . GERD (gastroesophageal reflux disease)   . Headache    migraines  . Hypertension   . Hyperthyroidism 1990s  . PONV (postoperative nausea and vomiting)   . Shortness of breath dyspnea    with anxiety   Past Surgical History:  Procedure Laterality Date  . ENTEROSCOPY N/A 05/12/2019   Procedure: ENTEROSCOPY;  Surgeon: Doran Stabler, MD;  Location: [facility];  Service: Gastroenterology;  Laterality: N/A;  . INCISE AND DRAIN ABCESS  2005  . LAPAROSCOPIC ASSISTED VAGINAL HYSTERECTOMY N/A 07/11/2015   Procedure: LAPAROSCOPIC ASSISTED VAGINAL HYSTERECTOMY;  Surgeon: Osborne Oman, MD;  Location: [facility];  Service: Gynecology;  Laterality: N/A;  . LAPAROSCOPIC BILATERAL SALPINGECTOMY Bilateral 07/11/2015   Procedure: LAPAROSCOPIC BILATERAL SALPINGO OOPHORECTOMY ;  Surgeon: Osborne Oman, MD;  Location: [facility];  Service: Gynecology;  Laterality: Bilateral;  . tubal ligation  2000  . WISDOM TOOTH EXTRACTION     Allergies  Allergen Reactions  . Azithromycin Anaphylaxis  . Meloxicam Nausea And Vomiting  . Sumatriptan Nausea  Only, Rash and Other (See Comments)    Throat felt like it was closing up  . Penicillins Nausea Only and Rash    Has patient had a PCN reaction causing immediate rash, facial/tongue/throat swelling, SOB or lightheadedness with hypotension: no Has patient had a PCN reaction causing severe rash involving mucus membranes or skin necrosis:unknown Has patient had a PCN reaction that required hospitalization : yes Has patient had a PCN reaction occurring within the last 10 years: no If all of the above answers are "NO", then may proceed with Cephalosporin use.   . Sulfa Antibiotics Nausea Only and Rash   No current facility-administered medications on file prior to encounter.   Current Outpatient Medications on File Prior to Encounter  Medication Sig Dispense Refill  . albuterol (VENTOLIN HFA) 108 (90 Base) MCG/ACT inhaler Inhale 2 puffs into the lungs every 6 (six) hours as needed for wheezing or shortness of breath. (Patient not taking: Reported on 07/03/2020) 18 g 3  . amLODipine (NORVASC) 5 MG tablet Take 1 tablet (5 mg total) by mouth daily. 90 tablet 3  . Cariprazine HCl (VRAYLAR) 4.5 MG CAPS Take 4.5 mg by mouth every morning.    . diphenhydrAMINE (BENADRYL) 25 mg capsule Take 1 capsule (25 mg total) by mouth every 6 (six) hours as needed for itching. 20 capsule 0  . EPINEPHrine 0.3 mg/0.3 mL IJ SOAJ injection Inject 0.3 mLs (0.3 mg total) into the muscle as needed for anaphylaxis (for difficulty  breathing, throat, tongue or lip swelling. must come to ED after use.). 1 each 0  . estradiol (ESTRACE) 2 MG tablet TAKE 1 TABLET BY MOUTH EVERY DAY 90 tablet 4  . ibuprofen (ADVIL) 800 MG tablet Take 1 tablet (800 mg total) by mouth every 8 (eight) hours as needed. (Patient not taking: Reported on 07/03/2020) 60 tablet 11  . methocarbamol (ROBAXIN-750) 750 MG tablet Take 1 tablet (750 mg total) by mouth 4 (four) times daily. 30 tablet 11  . Mouthwash Compounding Base LIQD Swish, gargle for 1 minute  and swallow up to 3 times daily until symptoms resolve 130 mL 0  . omeprazole (PRILOSEC) 20 MG capsule Take 1 capsule (20 mg total) by mouth daily. 90 capsule 0  . oxcarbazepine (TRILEPTAL) 600 MG tablet Take 600 mg by mouth 2 (two) times daily.     . pravastatin (PRAVACHOL) 40 MG tablet Take 1 tablet (40 mg total) by mouth daily. 90 tablet 3  . varenicline (CHANTIX) 0.5 MG tablet Take 1 tablet (0.5 mg total) by mouth 2 (two) times daily. 45 tablet 1   Social History   Socioeconomic History  . Marital status: Single    Spouse name: Not on file  . Number of children: Not on file  . Years of education: Not on file  . Highest education level: Not on file  Occupational History  . Not on file  Tobacco Use  . Smoking status: Current Every Day Smoker    Packs/day: 0.50    Years: 30.00    Pack years: 15.00    Types: Cigarettes    Start date: 12/16/1986  . Smokeless tobacco: Never Used  . Tobacco comment: Smoked 1 ppd for 25 years  Vaping Use  . Vaping Use: Never used  Substance and Sexual Activity  . Alcohol use: Not Currently    Alcohol/week: 0.0 standard drinks  . Drug use: No  . Sexual activity: Not on file  Other Topics Concern  . Not on file  Social History Narrative  . Not on file   Social Determinants of Health   Financial Resource Strain:   . Difficulty of Paying Living Expenses:   Food Insecurity:   . Worried About Charity fundraiser in the Last Year:   . Arboriculturist in the Last Year:   Transportation Needs:   . Film/video editor (Medical):   Marland Kitchen Lack of Transportation (Non-Medical):   Physical Activity:   . Days of Exercise per Week:   . Minutes of Exercise per Session:   Stress:   . Feeling of Stress :   Social Connections:   . Frequency of Communication with Friends and Family:   . Frequency of Social Gatherings with Friends and Family:   . Attends Religious Services:   . Active Member of Clubs or Organizations:   . Attends Archivist  Meetings:   Marland Kitchen Marital Status:   Intimate Partner Violence:   . Fear of Current or Ex-Partner:   . Emotionally Abused:   Marland Kitchen Physically Abused:   . Sexually Abused:    Family History  Problem Relation Age of Onset  . Ovarian cancer Mother 33       d. 52  . Heart disease Father   . Hypertension Father   . COPD Father   . Hypertension Paternal Aunt 3  . Lung cancer Paternal Aunt 74  . Breast cancer Paternal Aunt 32  . Thyroid disease Maternal Grandmother   . Lymphoma  Maternal Grandmother 61       NHL, recurrance at 68  . Lung cancer Maternal Grandmother 24  . Hypertension Paternal Grandmother   . Heart disease Paternal Grandmother   . Heart Problems Paternal Grandmother   . Dementia Paternal Grandmother   . Colon cancer Neg Hx   . Esophageal cancer Neg Hx   . Stomach cancer Neg Hx   . Pancreatic cancer Neg Hx     OBJECTIVE:  Vitals:   07/29/20 1412 07/29/20 1421  BP: 132/86   Pulse: 62   Resp: 16   Temp: 98.6 F (37 C)   TempSrc: Oral   SpO2: 97%   Weight:  218 lb 4.1 oz (99 kg)  Height:  5\' 4"  (1.626 m)     General appearance: alert; appears fatigued, but nontoxic; speaking in full sentences and tolerating own secretions HEENT: NCAT; Ears: EACs , TMs bilateral middle ear effusion; Eyes: PERRL.  EOM grossly intact. Sinuses: nontender; Nose: nares patent without rhinorrhea, Throat: oropharynx clear, tonsils 1+ non erythematous or enlarged, uvula midline  Neck: supple without LAD Lungs: unlabored respirations, symmetrical air entry; cough: mild; no respiratory distress; CTAB Heart: regular rate and rhythm.  Radial pulses 2+ symmetrical bilaterally Skin: warm and dry Psychological: alert and cooperative; normal mood and affect  LABS:  No results found for this or any previous visit (from the past 24 hour(s)).   ASSESSMENT & PLAN:  1. Sore throat (viral)   2. Encounter for screening for COVID-19   3. Middle ear effusion, bilateral     Meds ordered this  encounter  Medications  . fluticasone (FLONASE) 50 MCG/ACT nasal spray    Sig: Place 1 spray into both nostrils daily for 14 days.    Dispense:  16 g    Refill:  0  . menthol-cetylpyridinium (CEPACOL REGULAR STRENGTH) 3 MG lozenge    Sig: Take 1 lozenge (3 mg total) by mouth as needed for sore throat.    Dispense:  100 tablet    Refill:  1    Discharge Instructions.    COVID testing ordered.  It will take between 2-7 days for test results.  Someone will contact you regarding abnormal results.    In the meantime: You should remain isolated in your home for 10 days from symptom onset AND greater than 24 hours after symptoms resolution (absence of fever without the use of fever-reducing medication and improvement in respiratory symptoms), whichever is longer Get plenty of rest and push fluids Cepacol lozenges prescribed for sore throat Flonase for nasal congestion and runny nose Use medications daily for symptom relief Use OTC medications like ibuprofen or tylenol as needed fever or pain Call or go to the ED if you have any new or worsening symptoms such as fever, worsening cough, shortness of breath, chest tightness, chest pain, turning blue, changes in mental status, etc...   Reviewed expectations re: course of current medical issues. Questions answered. Outlined signs and symptoms indicating need for more acute intervention. Patient verbalized understanding. After Visit Summary given.         Note: This document was prepared using Dragon voice recognition software and may include unintentional dictation errors.    Emerson Monte, FNP 07/29/20 1501

## 2020-07-30 ENCOUNTER — Encounter: Payer: Self-pay | Admitting: Family Medicine

## 2020-07-30 LAB — SARS-COV-2, NAA 2 DAY TAT

## 2020-07-30 LAB — NOVEL CORONAVIRUS, NAA: SARS-CoV-2, NAA: NOT DETECTED

## 2020-08-01 ENCOUNTER — Encounter: Payer: Self-pay | Admitting: Student in an Organized Health Care Education/Training Program

## 2020-08-01 NOTE — Progress Notes (Signed)
SUBJECTIVE:   CHIEF COMPLAINT / HPI:   Bilateral earache: Patient seen in urgent care on 8/14 complaint of sore throat and ear pain at that time. Negative Covid test Saturday at urgent care. Gave a nasal spray for concern for allergies. Sore throat for 1.5 week, cough but has copd and cough not worsening, headache as well. Timeframe/onset: 1 month ago, worsening. No fevers, no myalgias. Severity (1-10): 9 Location: Both ears Character of pain: throbbing pain  Bilateral lower limb edema with new rash: Patient believes her edema is slowly getting worse.  States the rash started 4 days ago. No pain, no pruritis, no burning.  Patient denies rash elsewhere on her body.  Endorses that she has had to use more pillows lately when sleeping at night.  Also endorses some shortness of breath with activity though she states she always has some level of this.  She is unsure if this is getting worse.  Patient does states she had a normal echo performed about a year ago.   PERTINENT  PMH / PSH: History of COPD.  Echo from 1 year ago with normal ejection fraction and no diastolic dysfunction.  OBJECTIVE:   BP (!) 142/82   Pulse 62   Ht 5\' 4"  (1.626 m)   Wt 220 lb 9.6 oz (100.1 kg)   LMP  (LMP Unknown) Comment: on Megace  SpO2 99%   BMI 37.87 kg/m    General: NAD, pleasant, able to participate in exam HEENT: Right tympanic membrane and ear canal with no abnormalities, left tympanic membrane is normal with some erythema noted of the ear canal and some tenderness on insertion of the speculum. Cardiac: RRR, no murmurs. Respiratory: No respiratory distress Extremities: Edema present without pitting. Calves 17cm left, 17 1/8cm right.  Thigh mass consistent with stasis dermatitis present bilaterally on the lower extremities on the lateral and anterior aspect.  No signs of infection.  Nonpainful/nonpruritic on palpation. Psych: Normal affect and mood  ASSESSMENT/PLAN:   TMJ (temporomandibular joint  syndrome) Assessment: Patient with bilateral ear pain which is slowly worsening.  Patient states that she does chew gum on a regular basis.  Does not know if she grinds her teeth.  She has been evaluated in urgent care for her bilateral ear pain and was noted to not have an obvious ear infection.  On physical exam today patient does have some erythema of the canal of the left ear with normal tympanic membrane of the left and right.  Patient does have pain to palpation of the TMJ area.  No obvious dental abnormalities on physical exam. Plan: -Recommend patient evaluated by dentist to look for signs of teeth grinding and consideration of a nightguard if appropriate -Provide patient with information regarding TMJ -We will provide antibiotics for otitis externa due to erythema of the left ear canal, as this has low risk, though with patient's symptoms been bilateral her symptoms are most consistent with TMJ.    Otitis externa Assessment: Patient with bilateral ear ache but with normal right ear canal and tympanic membrane.  Left ear canal does show some erythema with normal tympanic membrane.  Patient also with symptoms of TMJ on physical exam.  Patient's bilateral ear pain most likely consistent with TMJ, however due to the erythema of the left ear canal will prescribe antibiotic drops for acute otitis externa. Plan: -Ear drops provided for acute otitis externa -TMJ treatment/recommendations as above -Patient plans to follow-up in the next 2 weeks if symptoms worsen or  do not improve.  Bilateral leg edema Assessment: Patient with bilateral leg edema which patient believes is worsening and now with new rash appearing which is consistent with stasis dermatitis.  Patient does endorse some symptoms of increased dyspnea with activity.  Patient does not endorse symptoms of shortness of breath during our evaluation.  Patient denies chest pain.  Patient with normal echo 1 year ago but does endorse needing to  sleep on a greater number of pillows at night over the past few weeks/months.  On physical exam patient's legs both show edema present without pitting.  Low concern for DVT as legs are bilaterally swollen with calves 17 cm on the left 17-1/8 cm on the right.  Of note patient also takes amlodipine which may very well be the cause of her lower limb edema, however her concerns of worsening dyspnea do point to more of a picture of CHF. Plan: -We will get echocardiogram to evaluate for new onset CHF    Lurline Del, [place]

## 2020-08-01 NOTE — Patient Instructions (Addendum)
It was great to see you!  Our plans for today:  - I have sent in an ear drop for your left ear. - I am ordering an echo for your heart to look for causes of the swelling legs - For the lower leg rash this is most likely stasis dermatitis - I would like to repeat some blood work today -  If you develop worsening shortness of breath, chest pain, or other concerning symptoms please go to the emergency department. -I would like for you to schedule an appointment with a dentist to evaluate for grinding of teeth as this is likely the cause of your bilateral ear pain due to TMJ.  I am providing some information about this condition below.  Take care and seek immediate care sooner if you develop any concerns.   Dr. [facility]    Temporomandibular Joint Syndrome  Temporomandibular joint syndrome (TMJ syndrome) is a condition that causes pain in the temporomandibular joints. These joints are located near your ears and allow your jaw to open and close. For people with TMJ syndrome, chewing, biting, or other movements of the jaw can be difficult or painful. TMJ syndrome is often mild and goes away within a few weeks. However, sometimes the condition becomes a long-term (chronic) problem. What are the causes? This condition may be caused by:  Grinding your teeth or clenching your jaw. Some people do this when they are under stress.  Arthritis.  Injury to the jaw.  Head or neck injury.  Teeth or dentures that are not aligned well. In some cases, the cause of TMJ syndrome may not be known. What are the signs or symptoms? The most common symptom of this condition is an aching pain on the side of the head in the area of the TMJ. Other symptoms may include:  Pain when moving your jaw, such as when chewing or biting.  Being unable to open your jaw all the way.  Making a clicking sound when you open your mouth.  Headache.  Earache.  Neck or shoulder pain. How is this  diagnosed? This condition may be diagnosed based on:  Your symptoms and medical history.  A physical exam. Your health care provider may check the range of motion of your jaw.  Imaging tests, such as X-rays or an MRI. You may also need to see your dentist, who will determine if your teeth and jaw are lined up correctly. How is this treated? TMJ syndrome often goes away on its own. If treatment is needed, the options may include:  Eating soft foods and applying ice or heat.  Medicines to relieve pain or inflammation.  Medicines or massage to relax the muscles.  A splint, bite plate, or mouthpiece to prevent teeth grinding or jaw clenching.  Relaxation techniques or counseling to help reduce stress.  A therapy for pain in which an electrical current is applied to the nerves through the skin (transcutaneous electrical nerve stimulation).  Acupuncture. This is sometimes helpful to relieve pain.  Jaw surgery. This is rarely needed. Follow these instructions at home:  Eating and drinking  Eat a soft diet if you are having trouble chewing.  Avoid foods that require a lot of chewing. Do not chew gum. General instructions  Take over-the-counter and prescription medicines only as told by your health care provider.  If directed, put ice on the painful area. ? Put ice in a plastic bag. ? Place a towel between your skin and the bag. ?  Leave the ice on for 20 minutes, 2-3 times a day.  Apply a warm, wet cloth (warm compress) to the painful area as directed.  Massage your jaw area and do any jaw stretching exercises as told by your health care provider.  If you were given a splint, bite plate, or mouthpiece, wear it as told by your health care provider.  Keep all follow-up visits as told by your health care provider. This is important. Contact a health care provider if:  You are having trouble eating.  You have new or worsening symptoms. Get help right away if:  Your jaw  locks open or closed. Summary  Temporomandibular joint syndrome (TMJ syndrome) is a condition that causes pain in the temporomandibular joints. These joints are located near your ears and allow your jaw to open and close.  TMJ syndrome is often mild and goes away within a few weeks. However, sometimes the condition becomes a long-term (chronic) problem.  Symptoms include an aching pain on the side of the head in the area of the TMJ, pain when chewing or biting, and being unable to open your jaw all the way. You may also make a clicking sound when you open your mouth.  TMJ syndrome often goes away on its own. If treatment is needed, it may include medicines to relieve pain, reduce inflammation, or relax the muscles. A splint, bite plate, or mouthpiece may also be used to prevent teeth grinding or jaw clenching. This information is not intended to replace advice given to you by your health care provider. Make sure you discuss any questions you have with your health care provider. Document Revised: 02/13/2018 Document Reviewed: 01/13/2018 Elsevier Patient Education  2020 [employer].

## 2020-08-02 ENCOUNTER — Other Ambulatory Visit: Payer: Self-pay

## 2020-08-02 ENCOUNTER — Ambulatory Visit (INDEPENDENT_AMBULATORY_CARE_PROVIDER_SITE_OTHER): Payer: BC Managed Care – PPO | Admitting: Family Medicine

## 2020-08-02 VITALS — BP 142/82 | HR 62 | Ht 64.0 in | Wt 220.6 lb

## 2020-08-02 DIAGNOSIS — R600 Localized edema: Secondary | ICD-10-CM | POA: Diagnosis not present

## 2020-08-02 DIAGNOSIS — E871 Hypo-osmolality and hyponatremia: Secondary | ICD-10-CM | POA: Diagnosis not present

## 2020-08-02 DIAGNOSIS — H60332 Swimmer's ear, left ear: Secondary | ICD-10-CM | POA: Diagnosis not present

## 2020-08-02 DIAGNOSIS — J349 Unspecified disorder of nose and nasal sinuses: Secondary | ICD-10-CM | POA: Insufficient documentation

## 2020-08-02 DIAGNOSIS — S0300XA Dislocation of jaw, unspecified side, initial encounter: Secondary | ICD-10-CM

## 2020-08-02 DIAGNOSIS — M26609 Unspecified temporomandibular joint disorder, unspecified side: Secondary | ICD-10-CM | POA: Diagnosis not present

## 2020-08-02 DIAGNOSIS — H6090 Unspecified otitis externa, unspecified ear: Secondary | ICD-10-CM | POA: Insufficient documentation

## 2020-08-02 MED ORDER — NEOMYCIN-POLYMYXIN-HC 3.5-10000-1 OT SOLN
3.0000 [drp] | Freq: Four times a day (QID) | OTIC | 0 refills | Status: AC
Start: 2020-08-02 — End: 2020-08-07

## 2020-08-02 NOTE — Assessment & Plan Note (Signed)
Assessment: Patient with bilateral ear ache but with normal right ear canal and tympanic membrane.  Left ear canal does show some erythema with normal tympanic membrane.  Patient also with symptoms of TMJ on physical exam.  Patient's bilateral ear pain most likely consistent with TMJ, however due to the erythema of the left ear canal will prescribe antibiotic drops for acute otitis externa. Plan: -Ear drops provided for acute otitis externa -TMJ treatment/recommendations as above -Patient plans to follow-up in the next 2 weeks if symptoms worsen or do not improve.

## 2020-08-02 NOTE — Assessment & Plan Note (Signed)
Assessment: Patient with bilateral ear pain which is slowly worsening.  Patient states that she does chew gum on a regular basis.  Does not know if she grinds her teeth.  She has been evaluated in urgent care for her bilateral ear pain and was noted to not have an obvious ear infection.  On physical exam today patient does have some erythema of the canal of the left ear with normal tympanic membrane of the left and right.  Patient does have pain to palpation of the TMJ area.  No obvious dental abnormalities on physical exam. Plan: -Recommend patient evaluated by dentist to look for signs of teeth grinding and consideration of a nightguard if appropriate -Provide patient with information regarding TMJ -We will provide antibiotics for otitis externa due to erythema of the left ear canal, as this has low risk, though with patient's symptoms been bilateral her symptoms are most consistent with TMJ.

## 2020-08-02 NOTE — Assessment & Plan Note (Signed)
Assessment: Patient with bilateral leg edema which patient believes is worsening and now with new rash appearing which is consistent with stasis dermatitis.  Patient does endorse some symptoms of increased dyspnea with activity.  Patient does not endorse symptoms of shortness of breath during our evaluation.  Patient denies chest pain.  Patient with normal echo 1 year ago but does endorse needing to sleep on a greater number of pillows at night over the past few weeks/months.  On physical exam patient's legs both show edema present without pitting.  Low concern for DVT as legs are bilaterally swollen with calves 17 cm on the left 17-1/8 cm on the right.  Of note patient also takes amlodipine which may very well be the cause of her lower limb edema, however her concerns of worsening dyspnea do point to more of a picture of CHF. Plan: -We will get echocardiogram to evaluate for new onset CHF

## 2020-08-03 ENCOUNTER — Ambulatory Visit (HOSPITAL_COMMUNITY)
Admission: RE | Admit: 2020-08-03 | Discharge: 2020-08-03 | Disposition: A | Discharge status: Home / Self Care | Payer: BC Managed Care – PPO | Source: Ambulatory Visit | Attending: Family Medicine | Admitting: Family Medicine

## 2020-08-03 DIAGNOSIS — R0600 Dyspnea, unspecified: Secondary | ICD-10-CM | POA: Insufficient documentation

## 2020-08-03 DIAGNOSIS — I10 Essential (primary) hypertension: Secondary | ICD-10-CM | POA: Diagnosis not present

## 2020-08-03 DIAGNOSIS — R600 Localized edema: Secondary | ICD-10-CM | POA: Diagnosis not present

## 2020-08-03 DIAGNOSIS — J449 Chronic obstructive pulmonary disease, unspecified: Secondary | ICD-10-CM | POA: Insufficient documentation

## 2020-08-03 LAB — BASIC METABOLIC PANEL
BUN/Creatinine Ratio: 17 (ref 9–23)
BUN: 11 mg/dL (ref 6–24)
CO2: 22 mmol/L (ref 20–29)
Calcium: 8.8 mg/dL (ref 8.7–10.2)
Chloride: 94 mmol/L — ABNORMAL LOW (ref 96–106)
Creatinine, Ser: 0.66 mg/dL (ref 0.57–1.00)
GFR calc Af Amer: 123 mL/min/{1.73_m2} (ref >59)
GFR calc non Af Amer: 106 mL/min/{1.73_m2} (ref >59)
Glucose: 84 mg/dL (ref 65–99)
Potassium: 4.6 mmol/L (ref 3.5–5.2)
Sodium: 129 mmol/L — ABNORMAL LOW (ref 134–144)

## 2020-08-03 LAB — ECHOCARDIOGRAM COMPLETE
Area-P 1/2: 3.17 cm2
S' Lateral: 2.7 cm

## 2020-08-03 NOTE — Progress Notes (Signed)
Echocardiogram 2D Echocardiogram has been performed.  Fidel Levy 08/03/2020, 10:23 AM

## 2020-08-07 ENCOUNTER — Encounter: Payer: Self-pay | Admitting: Family Medicine

## 2020-08-08 ENCOUNTER — Other Ambulatory Visit: Payer: Self-pay

## 2020-08-08 ENCOUNTER — Ambulatory Visit
Admission: EM | Admit: 2020-08-08 | Discharge: 2020-08-08 | Disposition: A | Discharge status: Home / Self Care | Payer: BC Managed Care – PPO | Attending: Emergency Medicine | Admitting: Emergency Medicine

## 2020-08-08 ENCOUNTER — Ambulatory Visit
Admission: EM | Admit: 2020-08-08 | Discharge: 2020-08-08 | Discharge status: Absent without leave | Payer: BC Managed Care – PPO

## 2020-08-08 DIAGNOSIS — L0390 Cellulitis, unspecified: Secondary | ICD-10-CM | POA: Diagnosis not present

## 2020-08-08 MED ORDER — DOXYCYCLINE HYCLATE 100 MG PO CAPS
100.0000 mg | ORAL_CAPSULE | Freq: Two times a day (BID) | ORAL | 0 refills | Status: DC
Start: 2020-08-08 — End: 2020-08-08

## 2020-08-08 MED ORDER — DOXYCYCLINE HYCLATE 100 MG PO CAPS
100.0000 mg | ORAL_CAPSULE | Freq: Two times a day (BID) | ORAL | 0 refills | Status: DC
Start: 2020-08-08 — End: 2020-09-18

## 2020-08-08 NOTE — Discharge Instructions (Addendum)
Prescribed doxycycline take as directed and to completion Continue to alternate ibuprofen and tylenol as needed for pain and fever Follow up with PCP if symptoms persists Return or go to the ED if you have any new or worsening symptoms such as increased pain, redness, swelling, discharge, high fever, night sweats, abdominal pain, etc..Marland Kitchen

## 2020-08-08 NOTE — ED Provider Notes (Signed)
[facility]   [id number] 08/08/20 Arrival Time: 0910   Chief Complaint  Patient presents with   Recurrent Skin Infections     SUBJECTIVE: History from: patient.  Mariah Rice is a 49 y.o. female who presented to the urgent care with a complaint of bilateral lower extremities redness pain for the past 2 days.  Denies any precipitating event.  Localized pain and redness to lower extremities.  She describes the pain as constant and achy.  She has tried OTC medications without relief.  Her symptoms are made worse with ROM.  She denies similar symptoms in the past.  Denies chills, fever, nausea, vomiting, diarrhea   ROS: As per HPI.  All other pertinent ROS negative.     Past Medical History:  Diagnosis Date   Anxiety    COPD (chronic obstructive pulmonary disease) ([place])    Dental abscess    Depression    Family history of breast cancer    Family history of ovarian cancer    GERD (gastroesophageal reflux disease)    Headache    migraines   Hypertension    Hyperthyroidism 1990s   PONV (postoperative nausea and vomiting)    Shortness of breath dyspnea    with anxiety   Past Surgical History:  Procedure Laterality Date   ENTEROSCOPY N/A 05/12/2019   Procedure: ENTEROSCOPY;  Surgeon: Doran Stabler, MD;  Location: [facility];  Service: Gastroenterology;  Laterality: N/A;   INCISE AND DRAIN ABCESS  2005   LAPAROSCOPIC ASSISTED VAGINAL HYSTERECTOMY N/A 07/11/2015   Procedure: LAPAROSCOPIC ASSISTED VAGINAL HYSTERECTOMY;  Surgeon: Osborne Oman, MD;  Location: [facility];  Service: Gynecology;  Laterality: N/A;   LAPAROSCOPIC BILATERAL SALPINGECTOMY Bilateral 07/11/2015   Procedure: LAPAROSCOPIC BILATERAL SALPINGO OOPHORECTOMY ;  Surgeon: Osborne Oman, MD;  Location: [facility];  Service: Gynecology;  Laterality: Bilateral;   tubal ligation  2000   WISDOM TOOTH EXTRACTION     Allergies  Allergen Reactions   Azithromycin Anaphylaxis    Meloxicam Nausea And Vomiting   Sumatriptan Nausea Only, Rash and Other (See Comments)    Throat felt like it was closing up   Penicillins Nausea Only and Rash    Has patient had a PCN reaction causing immediate rash, facial/tongue/throat swelling, SOB or lightheadedness with hypotension: no Has patient had a PCN reaction causing severe rash involving mucus membranes or skin necrosis:unknown Has patient had a PCN reaction that required hospitalization : yes Has patient had a PCN reaction occurring within the last 10 years: no If all of the above answers are "NO", then may proceed with Cephalosporin use.    Sulfa Antibiotics Nausea Only and Rash   No current facility-administered medications on file prior to encounter.   Current Outpatient Medications on File Prior to Encounter  Medication Sig Dispense Refill   albuterol (VENTOLIN HFA) 108 (90 Base) MCG/ACT inhaler Inhale 2 puffs into the lungs every 6 (six) hours as needed for wheezing or shortness of breath. (Patient not taking: Reported on 07/03/2020) 18 g 3   amLODipine (NORVASC) 5 MG tablet Take 1 tablet (5 mg total) by mouth daily. 90 tablet 3   Cariprazine HCl (VRAYLAR) 4.5 MG CAPS Take 4.5 mg by mouth every morning.     diphenhydrAMINE (BENADRYL) 25 mg capsule Take 1 capsule (25 mg total) by mouth every 6 (six) hours as needed for itching. 20 capsule 0   EPINEPHrine 0.3 mg/0.3 mL IJ SOAJ injection Inject 0.3 mLs (0.3 mg total) into the  muscle as needed for anaphylaxis (for difficulty breathing, throat, tongue or lip swelling. must come to ED after use.). 1 each 0   estradiol (ESTRACE) 2 MG tablet TAKE 1 TABLET BY MOUTH EVERY DAY 90 tablet 4   fluticasone (FLONASE) 50 MCG/ACT nasal spray Place 1 spray into both nostrils daily for 14 days. 16 g 0   ibuprofen (ADVIL) 800 MG tablet Take 1 tablet (800 mg total) by mouth every 8 (eight) hours as needed. (Patient not taking: Reported on 07/03/2020) 60 tablet 11    menthol-cetylpyridinium (CEPACOL REGULAR STRENGTH) 3 MG lozenge Take 1 lozenge (3 mg total) by mouth as needed for sore throat. 100 tablet 1   methocarbamol (ROBAXIN-750) 750 MG tablet Take 1 tablet (750 mg total) by mouth 4 (four) times daily. 30 tablet 11   Mouthwash Compounding Base LIQD Swish, gargle for 1 minute and swallow up to 3 times daily until symptoms resolve 130 mL 0   omeprazole (PRILOSEC) 20 MG capsule Take 1 capsule (20 mg total) by mouth daily. 90 capsule 0   oxcarbazepine (TRILEPTAL) 600 MG tablet Take 600 mg by mouth 2 (two) times daily.      pravastatin (PRAVACHOL) 40 MG tablet Take 1 tablet (40 mg total) by mouth daily. 90 tablet 3   varenicline (CHANTIX) 0.5 MG tablet Take 1 tablet (0.5 mg total) by mouth 2 (two) times daily. 45 tablet 1   Social History   Socioeconomic History   Marital status: Single    Spouse name: Not on file   Number of children: Not on file   Years of education: Not on file   Highest education level: Not on file  Occupational History   Not on file  Tobacco Use   Smoking status: Current Every Day Smoker    Packs/day: 0.50    Years: 30.00    Pack years: 15.00    Types: Cigarettes    Start date: 12/16/1986   Smokeless tobacco: Never Used   Tobacco comment: Smoked 1 ppd for 25 years  Vaping Use   Vaping Use: Never used  Substance and Sexual Activity   Alcohol use: Not Currently    Alcohol/week: 0.0 standard drinks   Drug use: No   Sexual activity: Not on file  Other Topics Concern   Not on file  Social History Narrative   Not on file   Social Determinants of Health   Financial Resource Strain:    Difficulty of Paying Living Expenses: Not on file  Food Insecurity:    Worried About Charity fundraiser in the Last Year: Not on file   [employer] of Food in the Last Year: Not on file  Transportation Needs:    Lack of Transportation (Medical): Not on file   Lack of Transportation (Non-Medical): Not on file    Physical Activity:    Days of Exercise per Week: Not on file   Minutes of Exercise per Session: Not on file  Stress:    Feeling of Stress : Not on file  Social Connections:    Frequency of Communication with Friends and Family: Not on file   Frequency of Social Gatherings with Friends and Family: Not on file   Attends Religious Services: Not on file   Active Member of Clubs or Organizations: Not on file   Attends Archivist Meetings: Not on file   Marital Status: Not on file  Intimate Partner Violence:    Fear of Current or Ex-Partner: Not on file  Emotionally Abused: Not on file   Physically Abused: Not on file   Sexually Abused: Not on file   Family History  Problem Relation Age of Onset   Ovarian cancer Mother 4       d. 40   Heart disease Father    Hypertension Father    COPD Father    Hypertension Paternal Aunt 60   Lung cancer Paternal Aunt 74   Breast cancer Paternal Aunt 37   Thyroid disease Maternal Grandmother    Lymphoma Maternal Grandmother 61       NHL, recurrance at 64   Lung cancer Maternal Grandmother 61   Hypertension Paternal Grandmother    Heart disease Paternal Grandmother    Heart Problems Paternal Grandmother    Dementia Paternal Grandmother    Colon cancer Neg Hx    Esophageal cancer Neg Hx    Stomach cancer Neg Hx    Pancreatic cancer Neg Hx     OBJECTIVE:  Vitals:   08/08/20 0924  BP: 117/79  Pulse: 77  Resp: 15  Temp: 97.8 F (36.6 C)  TempSrc: Oral  SpO2: 96%     Physical Exam Vitals and nursing note reviewed.  Constitutional:      General: She is not in acute distress.    Appearance: Normal appearance. She is normal weight. She is not ill-appearing, toxic-appearing or diaphoretic.  HENT:     Head: Normocephalic.  Cardiovascular:     Rate and Rhythm: Normal rate and regular rhythm.     Pulses: Normal pulses.     Heart sounds: Normal heart sounds. No murmur heard.  No friction rub.  No gallop.   Pulmonary:     Effort: Pulmonary effort is normal. No respiratory distress.     Breath sounds: Normal breath sounds. No stridor. No wheezing, rhonchi or rales.  Chest:     Chest wall: No tenderness.  Skin:    General: Skin is warm.     Findings: Ecchymosis and erythema present.     Comments: Red patch, tender to touch on both lower extremities, pruritic at times.  Neurological:     Mental Status: She is alert and oriented to person, place, and time.     LABS:  No results found for this or any previous visit (from the past 24 hour(s)).   ASSESSMENT & PLAN:  1. Cellulitis of skin     Meds ordered this encounter  Medications   doxycycline (VIBRAMYCIN) 100 MG capsule    Sig: Take 1 capsule (100 mg total) by mouth 2 (two) times daily.    Dispense:  20 capsule    Refill:  0    Discharge Instructions Prescribed doxycycline take as directed and to completion Continue to alternate ibuprofen and tylenol as needed for pain and fever Follow up with PCP if symptoms persists Return or go to the ED if you have any new or worsening symptoms such as increased pain, redness, swelling, discharge, high fever, night sweats, abdominal pain, etc...    Reviewed expectations re: course of current medical issues. Questions answered. Outlined signs and symptoms indicating need for more acute intervention. Patient verbalized understanding. After Visit Summary given.      Note: This document was prepared using Dragon voice recognition software and may include unintentional dictation errors.    Emerson Monte, [place] 08/08/20 [phone]

## 2020-08-08 NOTE — ED Triage Notes (Signed)
Pt presents with redness to her lower extremities bilaterally. Pt reports has been there x 2 days. Area is painful and tender to touch. Pt denies any other symptoms.

## 2020-08-09 ENCOUNTER — Ambulatory Visit: Payer: BC Managed Care – PPO

## 2020-08-11 ENCOUNTER — Encounter: Payer: Self-pay | Admitting: Family Medicine

## 2020-08-11 ENCOUNTER — Ambulatory Visit (INDEPENDENT_AMBULATORY_CARE_PROVIDER_SITE_OTHER): Payer: BC Managed Care – PPO | Admitting: Family Medicine

## 2020-08-11 ENCOUNTER — Other Ambulatory Visit: Payer: Self-pay

## 2020-08-11 VITALS — BP 122/80 | HR 74 | Ht 64.0 in | Wt 211.6 lb

## 2020-08-11 DIAGNOSIS — R600 Localized edema: Secondary | ICD-10-CM | POA: Diagnosis not present

## 2020-08-11 DIAGNOSIS — E871 Hypo-osmolality and hyponatremia: Secondary | ICD-10-CM

## 2020-08-11 DIAGNOSIS — E669 Obesity, unspecified: Secondary | ICD-10-CM | POA: Diagnosis not present

## 2020-08-11 DIAGNOSIS — Z720 Tobacco use: Secondary | ICD-10-CM

## 2020-08-11 DIAGNOSIS — J349 Unspecified disorder of nose and nasal sinuses: Secondary | ICD-10-CM

## 2020-08-11 DIAGNOSIS — R7303 Prediabetes: Secondary | ICD-10-CM

## 2020-08-11 DIAGNOSIS — I10 Essential (primary) hypertension: Secondary | ICD-10-CM

## 2020-08-11 MED ORDER — CETIRIZINE HCL 10 MG PO TABS
10.0000 mg | ORAL_TABLET | Freq: Every day | ORAL | 11 refills | Status: DC
Start: 2020-08-11 — End: 2022-02-05

## 2020-08-11 NOTE — Assessment & Plan Note (Addendum)
After evaluation, this is likely venous insufficiency.  She does not have any erythema suggestive of cellulitis, though she does report that it was deeply warm the other day.  She can continue doxycycline until she is finished with course.  I have recommended compression stockings and elevation.  Would also recommend salt restriction, however, patient appears to have a chronic hyponatremia likely related to her Trileptal as hyponatremia started was around the same time that she started this Trileptal.

## 2020-08-11 NOTE — Progress Notes (Signed)
    SUBJECTIVE:   CHIEF COMPLAINT / HPI:   Ear ache  Dull ache on inside of ear. No decrease in hearing. Switches back and forth at night depending on which side she lays on. Hx of allergies. Flonase + benadryl (PRN). Flonase 1 spray in each nostril once a day.   Swelling in her legs Patient reports continued swelling in her legs.  She went to urgent care and was started on doxycycline.  She reports that her legs were warm at that time.  Patient reports that she was having pain at the bottom of her feet due to the swelling.  She was not able to go to work.  Oxcarbazepine- hyponatremia   PERTINENT  PMH / PSH: Patient works at [employer].  She is on her feet for most of the day.  OBJECTIVE:   BP 122/80   Pulse 74   Ht 5\' 4"  (1.626 m)   Wt 211 lb 9.6 oz (96 kg)   LMP  (LMP Unknown) Comment: on Megace  SpO2 98%   BMI 36.32 kg/m   General: Well-appearing female, no acute distress HEENT: Right TM with scarring but no erythema or exudate or bulging appreciated.  Left tympanic membrane pearly gray with mild erythema surrounding TM and clear fluid levels behind.  No erythema Of the canal. Lower extremities: Mild erythema generalized on lower extremities with trace pitting edema.  No severe pain with palpation to lower extremities.  2+ pulses bilaterally.  Sensation intact.  ASSESSMENT/PLAN:   Sinus disorder On physical exam, the patient has scarring of right tympanic membrane and mild erythema of left tympanic membrane with clear fluid level behind.  No evidence of acute otitis media.  In addition to patient's symptoms of sinus pain, and ear pain that is dependent on position, this is likely chronic sinus issues.  Recommended that patient increase Flonase 2 sprays twice daily in each nostril.  Recommended correct method of spraying Flonase.  Also adding Zyrtec once daily.  This should help dry patient's sinuses.  If she continues to have symptoms after 4 to 6 weeks, she should return.  Bilateral  leg edema After evaluation, this is likely venous insufficiency.  She does not have any erythema suggestive of cellulitis, though she does report that it was deeply warm the other day.  She can continue doxycycline until she is finished with course.  I have recommended compression stockings and elevation.  Would also recommend salt restriction, however, patient appears to have a chronic hyponatremia likely related to her Trileptal as hyponatremia started was around the same time that she started this Trileptal.  Tobacco abuse Was not able to fill Chantix.  Recommended cessation.  Patient reports that she has tried at this time.  Hypertension Discontinuing amlodipine due to leg swelling.  Patient's blood pressure is 122/80 today.  I have instructed patient to continue monitoring her blood pressure at home to ensure she is not hypertensive after discontinuing amlodipine.  She was previously taking triamterene which was discontinued because it was thought to be causing her hyponatremia.  I think this is more likely her Trileptal.  We can discuss this further if she is requiring further hypertensive medication treatment.  Hyponatremia Likely due to [place], MD [place]

## 2020-08-11 NOTE — Patient Instructions (Addendum)
Swelling in your legs  1. Stop amlodipine  1. Check your blood pressures at home 3-4 x a week at the same time every day (preferably when you wake up in the morning or right before you go to bed).  2. Compression stockings for work  3. Finish doxycycline   Ear Pain and sinus pain  1. Increase flonase to 2 sprays twice a day  2. Start zyrtec daily

## 2020-08-11 NOTE — Assessment & Plan Note (Signed)
Was not able to fill Chantix.  Recommended cessation.  Patient reports that she has tried at this time.

## 2020-08-11 NOTE — Assessment & Plan Note (Signed)
On physical exam, the patient has scarring of right tympanic membrane and mild erythema of left tympanic membrane with clear fluid level behind.  No evidence of acute otitis media.  In addition to patient's symptoms of sinus pain, and ear pain that is dependent on position, this is likely chronic sinus issues.  Recommended that patient increase Flonase 2 sprays twice daily in each nostril.  Recommended correct method of spraying Flonase.  Also adding Zyrtec once daily.  This should help dry patient's sinuses.  If she continues to have symptoms after 4 to 6 weeks, she should return.

## 2020-08-11 NOTE — Assessment & Plan Note (Signed)
Likely due to Trileptal

## 2020-08-11 NOTE — Assessment & Plan Note (Signed)
Discontinuing amlodipine due to leg swelling.  Patient's blood pressure is 122/80 today.  I have instructed patient to continue monitoring her blood pressure at home to ensure she is not hypertensive after discontinuing amlodipine.  She was previously taking triamterene which was discontinued because it was thought to be causing her hyponatremia.  I think this is more likely her Trileptal.  We can discuss this further if she is requiring further hypertensive medication treatment.

## 2020-08-30 ENCOUNTER — Encounter: Payer: Self-pay | Admitting: Family Medicine

## 2020-08-31 MED ORDER — FLUTICASONE PROPIONATE 50 MCG/ACT NA SUSP: 1.0000 | Freq: Every day | NASAL | 0 refills | Status: DC

## 2020-09-08 ENCOUNTER — Telehealth: Payer: Self-pay | Admitting: Pharmacist

## 2020-09-08 DIAGNOSIS — Z720 Tobacco use: Secondary | ICD-10-CM

## 2020-09-08 NOTE — Assessment & Plan Note (Signed)
  Patient reports that she has met her goal of reducing her intake to 3 cigs per day by her birthday (in 3 days).    She also stated that her ear has been having intermittent pain for several weeks, noting she has a work-in appointment in 4 days.   She does not think her ears/nose have any significant congestion.   She reports her smoking is limited to after meals primarily.  We agreed that our short-term plan while she is in some pain intermittently is to avoid increasing intake (>3) and to avoid smoking a cigarette immediately after lunch for the short-term, attempting to get to 2 cigs per day over the next two weeks.   I plan to call her in 2 weeks.  If her ear pain is resolved, we agreed to talk about a quit date at that time.  I congratulated her on her progress.

## 2020-09-08 NOTE — Telephone Encounter (Signed)
Contacted patient RE tobacco cessation.   Patient reports that she has met her goal of reducing her intake to 3 cigs per day by her birthday (in 3 days).    She also stated that her ear has been having intermittent pain for several weeks, noting she has a work-in appointment in 4 days.   She does not think her ears/nose have any significant congestion.   She reports her smoking is limited to after meals primarily.  We agreed that our short-term plan while she is in some pain intermittently is to avoid increasing intake (>3) and to avoid smoking a cigarette immediately after lunch for the short-term, attempting to get to 2 cigs per day over the next two weeks.   I plan to call her in 2 weeks.  If her ear pain is resolved, we agreed to talk about a quit date at that time.  I congratulated her on her progress.

## 2020-09-11 NOTE — Telephone Encounter (Signed)
Noted and agree. 

## 2020-09-12 ENCOUNTER — Other Ambulatory Visit: Payer: Self-pay

## 2020-09-12 ENCOUNTER — Ambulatory Visit (INDEPENDENT_AMBULATORY_CARE_PROVIDER_SITE_OTHER): Payer: BC Managed Care – PPO | Admitting: Family Medicine

## 2020-09-12 VITALS — BP 100/70 | HR 61 | Ht 64.0 in | Wt 211.5 lb

## 2020-09-12 DIAGNOSIS — M722 Plantar fascial fibromatosis: Secondary | ICD-10-CM | POA: Diagnosis not present

## 2020-09-12 DIAGNOSIS — H9203 Otalgia, bilateral: Secondary | ICD-10-CM

## 2020-09-12 DIAGNOSIS — G8929 Other chronic pain: Secondary | ICD-10-CM | POA: Diagnosis not present

## 2020-09-12 NOTE — Assessment & Plan Note (Signed)
Assessment: 2-week history of discomfort to the plantar surface of the left foot.  No injury or obvious abnormality noted on physical exam.  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  Mariah Rice states that Mariah Rice was planning on purchasing these anyway and will purchase these to see if this helps with her symptoms.  With no obvious abnormality and no history of trauma as well as physical exam findings consistent with plantar fasciitis this is the most likely differential at this time. Plan: -Patient plans to get new work boots and will consider orthotics to help with plantar support -Discussed stretching techniques that Mariah Rice can use -Discussed with patient that Mariah Rice can try ice and Voltaren gel on the plantar aspect of her foot -Patient plans to follow-up in 2 weeks if Mariah Rice does not get some improvement with new boots, at this point we can consider physical therapy

## 2020-09-12 NOTE — Progress Notes (Signed)
SUBJECTIVE:   CHIEF COMPLAINT / HPI:   Ear pain likely secondary to TMJ: Patient is a 49 year old female the presents today to discuss ear pain. During previous encounter it was determined that there was no signs of acute otitis media.  The patient did report ear pain depending on position that she laid on which changed from side to side according to how she was laying.  At that time she was recommended to increase her Flonase 2 sprays twice daily into each nostril for sinuses and also added Zyrtec daily.  She was recommended to return in 4 weeks if she still had symptoms, she presents today 4 weeks later with continued symptoms. Mostly having issues with the right ear. Occasionally with the left. Has tried ear drops, otc "ear ache" medicine, etc with no benefit. Wakes her up from sleep sometimes. Hurts all day long. Thinks she may have some hearing decreased on her right side.  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  Left foot pain Patient also states for the past 2 weeks she has had pain in the central plantar aspect of her left foot.  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  Patient denies any injury.  States she has not tried any stretching or other modalities for the foot yet.   OBJECTIVE:   BP 100/70   Pulse 61   Ht 5\' 4"  (1.626 m)   Wt 211 lb 8 oz (95.9 kg)   LMP  (LMP Unknown) Comment: on Megace  SpO2 98%   BMI 36.30 kg/m    General: NAD, pleasant, able to participate in exam HEENT: No erythema of the ear canal bilaterally, tympanic membrane without erythema bilaterally.  No clicking of the jaw when testing for TMJ, though the patient does have a little bit of discomfort on the right side just anterior to the ear without erythema or swelling. Cardiac: RRR, no murmurs. Respiratory: CTAB Extremities: Left foot with no erythema, swelling,  lesions present but with discomfort to the plantar aspect in the range of the plantar fascia, most notable on the center of the plantar aspect of the foot.  ASSESSMENT/PLAN:   Chronic ear pain, bilateral Assessment: 49 year old female who is been seen multiple times for chronic ear pain, this occurs both the right and the left side but has been more prominent on the right side.  Previously was evaluated for TMJ as well as acute otitis externa.  No signs of TMJ on physical exam today though she does have a little bit of discomfort just anterior to the right ear on palpation.  No clicking of the jaw when checking the temporomandibular joint.  Patient does chew gum often and was chewing gum in the office today.  She states she has occasionally woken up from sleep with her ear pain which may suggest grinding of teeth, though no obvious signs of grinding teeth, or cavities/abscesses noted on oral exam.  Patient does now complain of some possible hearing loss that she thinks is more prevalent in the right ear.  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 Plan: -We will refer to ENT -Discussed continuing exercises for TMJ and discontinuing chewing gum  Plantar  fasciitis Assessment: 2-week history of discomfort to the plantar surface of the left foot.  No injury or obvious abnormality noted on physical exam.  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  She states that she was planning on purchasing these anyway and will purchase these to see if this helps with her symptoms.  With no obvious abnormality and no history of trauma as well as physical exam findings consistent with plantar fasciitis this is the most likely differential at this  time. Plan: -Patient plans to get new work boots and will consider orthotics to help with plantar support -Discussed stretching techniques that she can use -Discussed with patient that she can try ice and Voltaren gel on the plantar aspect of her foot -Patient plans to follow-up in 2 weeks if she does not get some improvement with new boots, at this point we can consider physical therapy     Lurline Del, [place]

## 2020-09-12 NOTE — Patient Instructions (Signed)
It was great to see you!  Our plans for today:  -Today we discussed your ear pain.  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  They should contact you within the next 2 weeks.  If you do not hear from them in the next 2 weeks please let me know. -In the meantime I recommend you stop chewing gum as this can make TMJ symptoms worse, sometimes he will have TMJ symptoms from grinding her teeth at night, in these cases sometimes a mouthguard at night will help with the symptoms.   Take care and seek immediate care sooner if you develop any concerns.   Dr. [facility]   Temporomandibular Joint Syndrome  Temporomandibular joint syndrome (TMJ syndrome) is a condition that causes pain in the temporomandibular joints. These joints are located near your ears and allow your jaw to open and close. For people with TMJ syndrome, chewing, biting, or other movements of the jaw can be difficult or painful. TMJ syndrome is often mild and goes away within a few weeks. However, sometimes the condition becomes a long-term (chronic) problem. What are the causes? This condition may be caused by:  Grinding your teeth or clenching your jaw. Some people do this when they are under stress.  Arthritis.  Injury to the jaw.  Head or neck injury.  Teeth or dentures that are not aligned well. In some cases, the cause of TMJ syndrome may not be known. What are the signs or symptoms? The most common symptom of this condition is an aching pain on the side of the head in the area of the TMJ. Other symptoms may include:  Pain when moving your jaw, such as when chewing or biting.  Being unable to open your jaw all the way.  Making a clicking sound when you open your mouth.  Headache.  Earache.  Neck or shoulder pain. How is this diagnosed? This condition may be diagnosed based  on:  Your symptoms and medical history.  A physical exam. Your health care provider may check the range of motion of your jaw.  Imaging tests, such as X-rays or an MRI. You may also need to see your dentist, who will determine if your teeth and jaw are lined up correctly. How is this treated? TMJ syndrome often goes away on its own. If treatment is needed, the options may include:  Eating soft foods and applying ice or heat.  Medicines to relieve pain or inflammation.  Medicines or massage to relax the muscles.  A splint, bite plate, or mouthpiece to prevent teeth grinding or jaw clenching.  Relaxation techniques or counseling to help reduce stress.  A therapy for pain in which an electrical current is applied to the nerves through the skin (transcutaneous electrical nerve stimulation).  Acupuncture. This is sometimes helpful to relieve pain.  Jaw surgery. This is rarely needed. Follow these instructions at home:  Eating and drinking  Eat a soft diet if you are having trouble chewing.  Avoid foods that require a lot of chewing. Do not chew gum. General instructions  Take over-the-counter and prescription medicines only as told by your health care provider.  If directed, put ice on the painful area. ? Put ice in a plastic bag. ? Place a towel between your skin and the bag. ? Leave the ice on for 20 minutes, 2-3 times  a day.  Apply a warm, wet cloth (warm compress) to the painful area as directed.  Massage your jaw area and do any jaw stretching exercises as told by your health care provider.  If you were given a splint, bite plate, or mouthpiece, wear it as told by your health care provider.  Keep all follow-up visits as told by your health care provider. This is important. Contact a health care provider if:  You are having trouble eating.  You have new or worsening symptoms. Get help right away if:  Your jaw locks open or closed. Summary  Temporomandibular  joint syndrome (TMJ syndrome) is a condition that causes pain in the temporomandibular joints. These joints are located near your ears and allow your jaw to open and close.  TMJ syndrome is often mild and goes away within a few weeks. However, sometimes the condition becomes a long-term (chronic) problem.  Symptoms include an aching pain on the side of the head in the area of the TMJ, pain when chewing or biting, and being unable to open your jaw all the way. You may also make a clicking sound when you open your mouth.  TMJ syndrome often goes away on its own. If treatment is needed, it may include medicines to relieve pain, reduce inflammation, or relax the muscles. A splint, bite plate, or mouthpiece may also be used to prevent teeth grinding or jaw clenching. This information is not intended to replace advice given to you by your health care provider. Make sure you discuss any questions you have with your health care provider. Document Revised: 02/13/2018 Document Reviewed: 01/13/2018 Elsevier Patient Education  2020 [employer].

## 2020-09-12 NOTE — Assessment & Plan Note (Signed)
Assessment: 49 year old female who is been seen multiple times for chronic ear pain, this occurs both the right and the left side but has been more prominent on the right side.  Previously was evaluated for TMJ as well as acute otitis externa.  No signs of TMJ on physical exam today though she does have a little bit of discomfort just anterior to the right ear on palpation.  No clicking of the jaw when checking the temporomandibular joint.  Patient does chew gum often and was chewing gum in the office today.  She states she has occasionally woken up from sleep with her ear pain which may suggest grinding of teeth, though no obvious signs of grinding teeth, or cavities/abscesses noted on oral exam.  Patient does now complain of some possible hearing loss that she thinks is more prevalent in the right ear.  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 Plan: -We will refer to ENT -Discussed continuing exercises for TMJ and discontinuing chewing gum

## 2020-09-18 ENCOUNTER — Ambulatory Visit (INDEPENDENT_AMBULATORY_CARE_PROVIDER_SITE_OTHER): Payer: BC Managed Care – PPO | Admitting: Family Medicine

## 2020-09-18 ENCOUNTER — Encounter: Payer: Self-pay | Admitting: Family Medicine

## 2020-09-18 ENCOUNTER — Other Ambulatory Visit: Payer: Self-pay

## 2020-09-18 VITALS — BP 128/74 | HR 69 | Ht 64.0 in | Wt 218.2 lb

## 2020-09-18 DIAGNOSIS — K219 Gastro-esophageal reflux disease without esophagitis: Secondary | ICD-10-CM

## 2020-09-18 DIAGNOSIS — J449 Chronic obstructive pulmonary disease, unspecified: Secondary | ICD-10-CM

## 2020-09-18 DIAGNOSIS — G43001 Migraine without aura, not intractable, with status migrainosus: Secondary | ICD-10-CM | POA: Diagnosis not present

## 2020-09-18 DIAGNOSIS — J349 Unspecified disorder of nose and nasal sinuses: Secondary | ICD-10-CM | POA: Diagnosis not present

## 2020-09-18 DIAGNOSIS — Z23 Encounter for immunization: Secondary | ICD-10-CM | POA: Diagnosis not present

## 2020-09-18 DIAGNOSIS — F39 Unspecified mood [affective] disorder: Secondary | ICD-10-CM

## 2020-09-18 MED ORDER — SPIRIVA RESPIMAT 2.5 MCG/ACT IN AERS: 2.0000 | INHALATION_SPRAY | Freq: Every day | RESPIRATORY_TRACT | 11 refills | Status: DC

## 2020-09-18 MED ORDER — OMEPRAZOLE 20 MG PO CPDR: 20.0000 mg | DELAYED_RELEASE_CAPSULE | Freq: Every day | ORAL | 3 refills | Status: DC

## 2020-09-18 MED ORDER — FLUTICASONE PROPIONATE 50 MCG/ACT NA SUSP: 1.0000 | Freq: Every day | NASAL | 11 refills | Status: DC

## 2020-09-18 MED ORDER — PROPRANOLOL HCL ER 60 MG PO CP24: 60.0000 mg | ORAL_CAPSULE | Freq: Every day | ORAL | 3 refills | Status: DC

## 2020-09-18 NOTE — Progress Notes (Signed)
    SUBJECTIVE:  CHIEF COMPLAINT / HPI:   1. Migraine without aura and with status migrainosus, not intractable Well controlled. Currently taking propranolol ER. Requesting refills   2. COPD suggested by initial evaluation ([place]) Denies SOB, DOE. No albuterol use. No recent hospitalizations.  3. Sinus disorder Uses cetirizine daily. Also continuing to have ear pain. Currently waiting ENT referral.  4. Gastroesophageal reflux disease, unspecified whether esophagitis present Omeprazole daily. Well controlled with daily med use.   PERTINENT  PMH / PSH: COPD, chronic back pain, tobacco abuse, bipolar  OBJECTIVE:  BP 128/74   Pulse 69   Ht 5\' 4"  (1.626 m)   Wt 218 lb 3.2 oz (99 kg)   LMP  (LMP Unknown) Comment: on Megace  SpO2 98%   BMI 37.45 kg/m   Gen: NAD, alert, non-toxic, sitting comfortably  Skin: Warm and dry. No obvious rashes, lesions, or trauma. HEENT: NCAT. EOMI. No conjunctival pallor or injection. No scleral icterus or injection.  MMM.  Neck: Soft, supple. No LAD. No JVD. CV: RRR.  Normal S1, S2. No m/r/g.  RP 2+ bilaterally. No BLEE. Resp: CTAB.  No w/r/r.  No increased WOB Abd: +BS. NTND on palpation. Psych: Cooperative. Pleasant. Makes eye contact. Speech normal. Extremities: Moves extremities spontaneously. Extremities warm and well perfused.  Neuro: CN II-XII grossly intact. No FNDs.   ASSESSMENT/PLAN:  GERD (gastroesophageal reflux disease) Well-controlled.  Refill requested.  COPD suggested by initial evaluation [facility]) Patient still needs official diagnosis.  She is doing well on Spiriva at this time with no shortness of breath or complaints.   Migraine without aura and with status migrainosus, not intractable Well-controlled on propranolol. VSS. No further complaints today. Continue current management.   Mood disorder [facility]) Patient requesting these medications to be prescribed at [facility]. Unclear Bipolar diagnosis.  Recommend the patient find a psychiatrist  in the area.    Wilber Oliphant, MD [place]

## 2020-09-18 NOTE — Progress Notes (Deleted)
Taking trileptal - takes for Bipolar. -- pt to find provider.

## 2020-09-18 NOTE — Patient Instructions (Addendum)
Problem List Items Addressed This Visit      Active Problems   COPD suggested by initial evaluation ([place])   Relevant Medications   SPIRIVA RESPIMAT 2.5 MCG/ACT AERS   GERD (gastroesophageal reflux disease)   Relevant Medications   omeprazole (PRILOSEC) 20 MG capsule   Migraine without aura and with status migrainosus, not intractable - Primary   Relevant Medications   propranolol ER (INDERAL LA) 60 MG 24 hr capsule   Sinus disorder   Relevant Medications   fluticasone (FLONASE) 50 MCG/ACT nasal spray     I recommend Dr. Benjamine Mola at  [facility] to make an appointment. Please make sure they cover your insurance.   Su Raynelle Bring, MD, PA [phone] [address] [place], [place] [address] [phone]

## 2020-09-19 NOTE — Assessment & Plan Note (Addendum)
Patient requesting these medications to be prescribed at [facility]. Unclear Bipolar diagnosis.  Recommend the patient find a psychiatrist in the area.

## 2020-09-19 NOTE — Assessment & Plan Note (Signed)
Well-controlled.  Refill requested.

## 2020-09-19 NOTE — Assessment & Plan Note (Addendum)
Patient still needs official diagnosis.  She is doing well on Spiriva at this time with no shortness of breath or complaints.

## 2020-09-19 NOTE — Assessment & Plan Note (Addendum)
Well-controlled on propranolol. VSS. No further complaints today. Continue current management.

## 2020-09-21 ENCOUNTER — Telehealth: Payer: Self-pay | Admitting: Pharmacist

## 2020-09-21 NOTE — Telephone Encounter (Signed)
Patient contacted about tobacco cessation.   Patient reports smoking ~ 3 per day and mostly after meals.  We discussed the possibility of stopping the lunch cigarette and also targeting a quit date to stop completely.   She remains committed to quit all smoking and vaping by the end of the year.   We agreed that a follow-up phone call in ~ 3 weeks may be helpful. I will plan to contact her at that time.

## 2020-09-21 NOTE — Telephone Encounter (Signed)
Noted and agree. 

## 2020-09-22 ENCOUNTER — Other Ambulatory Visit: Payer: Self-pay | Admitting: Family Medicine

## 2020-09-22 DIAGNOSIS — J349 Unspecified disorder of nose and nasal sinuses: Secondary | ICD-10-CM

## 2020-10-06 ENCOUNTER — Other Ambulatory Visit: Payer: Self-pay | Admitting: Otolaryngology

## 2020-10-06 DIAGNOSIS — H918X9 Other specified hearing loss, unspecified ear: Secondary | ICD-10-CM

## 2020-10-12 ENCOUNTER — Telehealth: Payer: Self-pay | Admitting: Pharmacist

## 2020-10-12 NOTE — Telephone Encounter (Addendum)
Called patient for tobacco cessation follow-up.  She reports doing well, still smoking ~3 cigarettes per day. She doesn't admit to any particular triggers but she does smoke after meals and lives with other people that smoke too.   She smokes in the car and at work. Encouraged her to focus on quitting smoking in one location at a time versus trying to juggle both at once. She has tried smoking cessation products in the past and isn't interested in restarting them or starting a medication, but is motivated to slowly cut out cigarettes. She says she wants to be done smoking by the first of the year - mentioned this is a great New Year's resolution to make.  She accepted a follow-up phone call in 2 weeks.   Phone call conducted with Mercy Riding, PharmD PGY-1 resident.

## 2020-10-26 ENCOUNTER — Telehealth: Payer: Self-pay | Admitting: Pharmacist

## 2020-10-26 NOTE — Telephone Encounter (Signed)
Attempted to contact patient RE tobacco cessation.   Left message that I will try again soon.   Phone message / conversation conducted with Adria Dill, PharmD Candidate.

## 2020-10-31 ENCOUNTER — Telehealth: Payer: Self-pay | Admitting: Pharmacist

## 2020-10-31 ENCOUNTER — Other Ambulatory Visit: Payer: Self-pay

## 2020-10-31 ENCOUNTER — Ambulatory Visit
Admission: RE | Admit: 2020-10-31 | Discharge: 2020-10-31 | Disposition: A | Discharge status: Home / Self Care | Payer: BC Managed Care – PPO | Source: Ambulatory Visit | Attending: Otolaryngology | Admitting: Otolaryngology

## 2020-10-31 DIAGNOSIS — H918X9 Other specified hearing loss, unspecified ear: Secondary | ICD-10-CM

## 2020-10-31 MED ORDER — GADOBENATE DIMEGLUMINE 529 MG/ML IV SOLN
20.0000 mL | Freq: Once | INTRAVENOUS | Status: AC | PRN
  Administered 2020-10-31: 20 mL via INTRAVENOUS

## 2020-10-31 NOTE — Telephone Encounter (Signed)
Attempted phone call RE tobacco cessation.   Left message for patient to return call if additional assistance needed.  No further calls planned at this time.   Phone call conducted by Adria Dill, PharmD candidate.

## 2020-11-16 ENCOUNTER — Encounter: Payer: Self-pay | Admitting: Podiatry

## 2020-11-16 ENCOUNTER — Other Ambulatory Visit: Payer: Self-pay

## 2020-11-16 ENCOUNTER — Ambulatory Visit: Payer: BC Managed Care – PPO | Admitting: Podiatry

## 2020-11-16 ENCOUNTER — Ambulatory Visit (INDEPENDENT_AMBULATORY_CARE_PROVIDER_SITE_OTHER): Payer: BC Managed Care – PPO

## 2020-11-16 DIAGNOSIS — M84374A Stress fracture, right foot, initial encounter for fracture: Secondary | ICD-10-CM | POA: Diagnosis not present

## 2020-11-16 DIAGNOSIS — S9031XA Contusion of right foot, initial encounter: Secondary | ICD-10-CM

## 2020-11-16 NOTE — Progress Notes (Signed)
Subjective:   Patient ID: Mariah Rice, female   DOB: 49 y.o.   MRN: [id number]   HPI Patient presents stating he is having severe pain in her right heel and states it is been swollen and hard to walk on.  It also hurts into the arch but mostly within the heel bone itself and is very swollen and inflamed.  States this is only been going on for the last 2 days   ROS      Objective:  Physical Exam  Neurovascular status intact with exquisite discomfort within the body of the calcaneus right and also into the plantar tendon and into the arch.  Both areas are very tender when palpated     Assessment:  Probability for stress fracture right with also possibility of acute plantar fasciitis for both conditions are currently     Plan:  H&P and condition reviewed.  I reviewed x-ray and at this point placed into an air fracture walker to completely immobilize take off all plantar pressure patient will take Motrin 800 mg and utilize aggressive ice and will be seen back for [place] to recheck again in 3 weeks or earlier if any pathology were to occur  X-rays indicate no current indication of fracture but difficult to make determination at this early time

## 2020-11-28 ENCOUNTER — Other Ambulatory Visit: Payer: Self-pay

## 2020-11-30 ENCOUNTER — Other Ambulatory Visit: Payer: Self-pay

## 2020-11-30 ENCOUNTER — Ambulatory Visit (INDEPENDENT_AMBULATORY_CARE_PROVIDER_SITE_OTHER): Payer: BC Managed Care – PPO | Admitting: Podiatry

## 2020-11-30 ENCOUNTER — Encounter: Payer: Self-pay | Admitting: Podiatry

## 2020-11-30 ENCOUNTER — Ambulatory Visit (INDEPENDENT_AMBULATORY_CARE_PROVIDER_SITE_OTHER): Payer: BC Managed Care – PPO

## 2020-11-30 DIAGNOSIS — M84374D Stress fracture, right foot, subsequent encounter for fracture with routine healing: Secondary | ICD-10-CM

## 2020-11-30 DIAGNOSIS — M722 Plantar fascial fibromatosis: Secondary | ICD-10-CM

## 2020-11-30 MED ORDER — TRIAMCINOLONE ACETONIDE 10 MG/ML IJ SUSP
10.0000 mg | Freq: Once | INTRAMUSCULAR | Status: AC
  Administered 2020-11-30: 10 mg

## 2020-12-01 NOTE — Progress Notes (Signed)
Subjective:   Patient ID: Mariah Rice, female   DOB: 49 y.o.   MRN: [id number]   HPI Patient states she is continuing to have pain in her right heel stating that the boot seems to help her quite a bit but she cannot wear it all the time and she has a lot of pain underneath the heel and the tendon.  The bone is still bothering her to a degree but not to the same degree as previous   ROS      Objective:  Physical Exam  Neurovascular status intact with patient found to have discomfort within the bulk of the calcaneus right but most the pain is in the plantar fascia at the insertional point tendon into the calcaneus     Assessment:  Possibility for stress fracture right calcaneus but does definitely have acute plantar fasciitis right     Plan:  Reviewed both conditions with her and we will continue the boot for stress fracture and I did discuss that this can take 8 to 12 weeks to heal completely.  Since the plantar fascia is so sore I did go ahead and I did a injection of 1.5 mg Dexasone Kenalog 5 mg Xylocaine explaining that we will keep it below the bone itself and that with boot usage it should not impede any healing of the structural calcaneus.  I did explain the risk of this and the delay of  healing but patient wants to do this due to the pain she is experiencing underneath and her inability to walk  X-rays were inconclusive about stress fracture with some calcaneal pattern changes within the calcaneus itself

## 2020-12-05 ENCOUNTER — Telehealth: Payer: Self-pay | Admitting: Podiatry

## 2020-12-05 NOTE — Telephone Encounter (Signed)
Patient stated the boot she was given, the strap has broken patient stated she wanted you to be aware, Please Advise

## 2020-12-06 ENCOUNTER — Telehealth: Payer: Self-pay | Admitting: Podiatry

## 2020-12-06 NOTE — Telephone Encounter (Signed)
LVM for patient to return call to get scheduled sooner per [place] regarding boot malfunction

## 2020-12-06 NOTE — Telephone Encounter (Signed)
Bring it in appointment

## 2020-12-14 ENCOUNTER — Ambulatory Visit (INDEPENDENT_AMBULATORY_CARE_PROVIDER_SITE_OTHER): Payer: BC Managed Care – PPO

## 2020-12-14 ENCOUNTER — Encounter: Payer: Self-pay | Admitting: Podiatry

## 2020-12-14 ENCOUNTER — Ambulatory Visit (INDEPENDENT_AMBULATORY_CARE_PROVIDER_SITE_OTHER): Payer: BC Managed Care – PPO | Admitting: Podiatry

## 2020-12-14 ENCOUNTER — Ambulatory Visit (INDEPENDENT_AMBULATORY_CARE_PROVIDER_SITE_OTHER): Payer: BC Managed Care – PPO | Admitting: Family Medicine

## 2020-12-14 ENCOUNTER — Encounter: Payer: Self-pay | Admitting: Family Medicine

## 2020-12-14 ENCOUNTER — Other Ambulatory Visit: Payer: Self-pay

## 2020-12-14 ENCOUNTER — Other Ambulatory Visit: Payer: Self-pay | Admitting: Podiatry

## 2020-12-14 VITALS — BP 108/62 | HR 73 | Wt 207.4 lb

## 2020-12-14 DIAGNOSIS — M722 Plantar fascial fibromatosis: Secondary | ICD-10-CM

## 2020-12-14 DIAGNOSIS — M84374D Stress fracture, right foot, subsequent encounter for fracture with routine healing: Secondary | ICD-10-CM | POA: Diagnosis not present

## 2020-12-14 DIAGNOSIS — F39 Unspecified mood [affective] disorder: Secondary | ICD-10-CM

## 2020-12-14 DIAGNOSIS — M79671 Pain in right foot: Secondary | ICD-10-CM

## 2020-12-14 NOTE — Patient Instructions (Addendum)
I have sent in a referral to [facility]. They will call you to make an appointment.  If they do not accept your insurance, call you insurance for a list of psychiatric providers that are covered. You can also use the list below.   Psychiatry Resource List (Adults and Children) Most of these providers will take Medicaid. please consult your insurance for a complete and updated list of available providers. When calling to make an appointment have your insurance information available to confirm you are covered.   BestDay:Psychiatry and Counseling [address] [place], [place] [address] [phone]  [facility]  [address] [place], [place] [phone] Crisis [phone]  [facility]:   [facility]: [address]     [phone]   Sidney Ace: [address] [place]. [place],        [phone] [place]: [address],    [phone] Kathryne Sharper: [address],                   [phone] Children: [facility] [address]         [phone]  [facility]  (Psychiatry only; Adults /children 12 and over, will take Medicaid)  [address] Laurell Josephs [address], [place], [place] [address]       [phone]  [facility] (Psychiatry & counseling ; adults & children ; will take Medicaid [address]  [place] [address]   Go on-line to complete referral ( [email or web address] [phone]   (Spanish therapist)  [facility]  Psychiatry & counseling; Adults and children;  Call Registration prior to scheduling an appointment [phone] [address]    [place], [place] [address]    [phone]  [facility] (Psychiatry & counseling; adults & children; Medicare no Medicaid)  [address]    [place], [place]  [address]      [phone]    [facility] (up to age 8)  Psychiatry & counseling ,will take Medicaid, must do counseling to receive psychiatry services  [address] [place] [address]        [phone]  [facility] (Psychiatry & counseling; adults & children; will take Medicaid) Will need a referral from provider [address],  [place], [place]  [phone]  [facility] --- Walk-In Mon-Friday 8am-3pm ( will take Medicaid, Psychiatry, Adults & children,  [address], [place], [place]   [phone]  [facility] [place]--, Walk-in M-F 8am-12pm and 1pm -3pm   (Counseling, Psychiatry, will take Medicaid, adults & children)  [address], [place], [place]  [phone]       Therapy and Counseling Resources Most providers on this list will take Medicaid. Patients with commercial insurance or Medicare should contact their insurance company to get a list of in network providers.  BestDay:Psychiatry and Counseling [address] [facility] [facility] [address] [address] [place], [place] [address] [phone]  [facility]  [address] Suite [place], [place] [address]      [phone]  [facility] [address]  [place], [place] [address] [phone]  [facility] [address]  [place], [place] [address]       [phone]      Jovita Kussmaul [facility] [address] Daphine Deutscher  [address], [place], [place]  [facility]:  [place]. [place] [phone]  [facility]:  [place] [address] Fate [phone]  [facility] (under & uninsured) [address], [place] [phone]    [email or web address]    [place] [address] . [place]    [phone]  [facility] [place]     Phone:  [phone]      [facility]  [place] [address]      [place], [place] [address] [place] [address]: [place], [place], [place]   [place] ([place] therapist) [email or web address]  [place] [address]   [place] [address]    [phone]    [facility]   [address],  [place], [place]   [place]  [place]  [facility]  [address] [place], [place]        [phone]  Open Access/Walk In Clinic under & uninsured  [facility]  [address] [place], [place] Crisis [phone]  [facility] [place],  ([place])   [place], [place]: [phone]) 8:30 - 12; 1 - 2:30  [facility] [place],  [place], [place]    [phone]):8:30 - 12; 2 - 3PM  [facility] [employer],  [address],  [place]; [phone]):   Mon - Fri 8 AM - 5 PM  [facility] [place]  MWF 12:30 to 3:00 or call to schedule an appointment  [phone]  Specific Provider options Psychology Today  [email or web address].[email or web address].com/us 1. click on find a therapist  2. enter your zip code 3. left side and select or tailor a therapist for your specific need.   [facility] Provider Directory [email or web address]  (Medicaid)   Follow all drop down to find a provider  [place] [phone] or [email or web address] [address], [place], [place] Recovery support and educational   24- Hour Availability:  .  [facility]  . [place], [place] Crisis [phone]  . [facility] the [employer] [phone]  [facility]  Crisis Service  [phone]   . [place]  [phone] (after hours)  . Therapeutic Alternative/Mobile Crisis   [phone]  . [place] National Suicide Hotline  (631)569-6395 ([place])  . Call 911 or go to emergency room  . [employer]  [phone]);  [place] and [employer]   . [facility] ACCESS  [phone]); [place], [place], [place], [place], [place], [place], [place]

## 2020-12-14 NOTE — Progress Notes (Signed)
Subjective:   Patient ID: Mariah Rice, female   DOB: 49 y.o.   MRN: [id number]   HPI Patient states she is gradually improving but she still has some pain in her foot if she does too much and she is not able to bear weight on it without her boot   ROS      Objective:  Physical Exam  Neurovascular status intact with continued intense discomfort within the calcaneus itself and moderate pain also in the plantar fascia.  It is getting better but is still very tender and she is not able to bear weight on it without boot     Assessment:  Probable stress fracture calcaneus right gradually improving with soreness still noted      Plan:  Reviewed condition continue boot usage ice therapy gradual reduction of the boot gradual increase in activity and reappoint 5 weeks  X-rays are suspicious for stress fracture of the calcaneus right

## 2020-12-14 NOTE — Assessment & Plan Note (Signed)
Sent referral to [facility].  Also called to leave a message to see how soon patient can get in.  Given patient's history of severe depression and bipolar and use of antipsychotics, this would be out of my scope of care and believe that patient would benefit from a psychiatric provider.  Patient does not have any SI today.  I have also provided patient with a list of other psychiatric resources should they not accept her insurance, as well as therapy and counseling resources.

## 2020-12-14 NOTE — Progress Notes (Signed)
   SUBJECTIVE:  CHIEF COMPLAINT / HPI:   Started seeing [facility] about a year and a half ago. Trialed on multiple SSRI/SNRI (Prozac, zoloft, -- about 10 total) and then Dr. Wilson Singer placed patient on trileptal and vraylar. Patient ran out of vraylar about 6 months ago. She took vraylar for a total of 8 months. She started these meds at the same time. Out of trileptal x 2 months with worsening of her mood.  Agitation and depression that leads to crying.  Patient also with Bipolar. Last manic episode a few weeks ago.   PHQ9 SCORE ONLY 12/14/2020 09/18/2020 09/12/2020  PHQ-9 Total Score 24 2 4    PERTINENT  PMH / PSH: Hypertension, migraine, COPD, anxiety and depression  OBJECTIVE:  BP 108/62   Pulse 73   Wt 207 lb 6.4 oz (94.1 kg)   LMP  (LMP Unknown) Comment: on Megace  SpO2 95%   BMI 35.60 kg/m   Patient well-appearing, no acute distress.  Mildly tearful when discussing depressive symptoms.  ASSESSMENT/PLAN:  Mood disorder [facility]) Sent referral to [facility].  Also called to leave a message to see how soon patient can get in.  Given patient's history of severe depression and bipolar and use of antipsychotics, this would be out of my scope of care and believe that patient would benefit from a psychiatric provider.  Patient does not have any SI today.  I have also provided patient with a list of other psychiatric resources should they not accept her insurance, as well as therapy and counseling resources.   [facility], MD [facility]

## 2021-01-04 ENCOUNTER — Other Ambulatory Visit: Payer: Self-pay

## 2021-01-04 ENCOUNTER — Ambulatory Visit (INDEPENDENT_AMBULATORY_CARE_PROVIDER_SITE_OTHER): Payer: BC Managed Care – PPO | Admitting: Family Medicine

## 2021-01-04 ENCOUNTER — Encounter: Payer: Self-pay | Admitting: Family Medicine

## 2021-01-04 VITALS — BP 122/66 | HR 80 | Ht 64.0 in | Wt 208.0 lb

## 2021-01-04 DIAGNOSIS — R7309 Other abnormal glucose: Secondary | ICD-10-CM | POA: Diagnosis not present

## 2021-01-04 DIAGNOSIS — F39 Unspecified mood [affective] disorder: Secondary | ICD-10-CM | POA: Diagnosis not present

## 2021-01-04 DIAGNOSIS — Z111 Encounter for screening for respiratory tuberculosis: Secondary | ICD-10-CM | POA: Diagnosis not present

## 2021-01-04 DIAGNOSIS — I10 Essential (primary) hypertension: Secondary | ICD-10-CM

## 2021-01-04 DIAGNOSIS — G43001 Migraine without aura, not intractable, with status migrainosus: Secondary | ICD-10-CM

## 2021-01-04 DIAGNOSIS — S92001D Unspecified fracture of right calcaneus, subsequent encounter for fracture with routine healing: Secondary | ICD-10-CM

## 2021-01-04 LAB — POCT GLYCOSYLATED HEMOGLOBIN (HGB A1C): Hemoglobin A1C: 5.6 % (ref 4.0–5.6)

## 2021-01-04 NOTE — Patient Instructions (Signed)
It was nice to meet you today,   We will get the TB test today.  The results will take a few days.  You can pick up a copy of them and your progress note next week.  I have given you a list of therapists below.  I would start with [facility] [place].      Have a great day,   Clemetine Marker, MD  [place] Providers (No Insurance at time of Visit or Self Pay)  [place] ([place]) walk in M-F 8am-12pm and  1pm-3pm [place]  [place]  Phone: [phone]  [facility]    Walk-in Mon-Fri, 8am-3pm [place], [place], [place]  [email or web address] (Mental Health and substance challenges) [address], [place] [phone]    [email or web address]    [facility] [place], [place]     Phone:  [phone] [place]  [phone]         [facility] Walk-in MWF 12:30 to 3:00     [place] [address]  [phone]  www.[email or web address] call to schedule an appointment    [place] ,Support group, Peer support services, [address] [place], [place] [address] [phone]  [email or web address]           National Alliance on Mental Illness (NAMI) [place]- Wellness classes, Support groups        [address] [place], [place] [address] [phone]   CurrentJokes.cz  [facility]  (Psycho-social Rehabilitation clubhouse, Individual and group therapy) [address] [place], [place] [address]   [phone]  24- Hour Availability:  *[place] or [phone] * Family Service of the [employer] (Domestic Violence, Rape, etc. )[phone] [place] Sessions [phone] or [phone] * [place] [phone] only) [phone] (after  hours) *Therapeutic Alternative Mobile Crisis Unit [phone] *[place] National Suicide Hotline [phone] Diamantina Monks)

## 2021-01-04 NOTE — Progress Notes (Signed)
    SUBJECTIVE:   CHIEF COMPLAINT / HPI:   HTN: Patient is not taking any medications for her blood pressure.  Mood disorder: Patient was taking Trileptal and Vraylar which was prescribed by [facility], but has not taken in the past 1.5 months because [facility] no longer takes her insurance and she has not been able to get it filled anywhere else.  She notices the difference in her mood since not taking the medication.  She also has been out of work for 2 months and does not know the current status of her insurance coverage.  HLD: Patient taking Pravachol nightly.  No issues with side effects.  Has been compliant.  Heel fracture-patient fractured her heel in December.  She has been out of work since that time.  She is seeing a podiatrist for this, Dr. Paulla Dolly.  Migraine: Patient taking propranolol for migraines.  She is not taking the Topamax because she did not like the way it made her feel and even made her headaches worse.  She thinks her propanolol needs to be "bumped up".  She was previously taking 80 mg but had the dose decreased by the pulmonologist due to interaction with her inhaler medication.  She takes Tylenol for episodic treatment but does not feel like this helps.  Patient had her COVID-vaccine in September.  She needs a TB test for work.  PERTINENT  PMH / PSH: HTN, HLD, migraines  OBJECTIVE:   BP 122/66   Pulse 80   Ht 5\' 4"  (1.626 m)   Wt 208 lb (94.3 kg)   LMP  (LMP Unknown) Comment: on Megace  SpO2 97%   BMI 35.70 kg/m   General: Alert and oriented. No acute distress. Wearing a boot on her right foot CV: Regular rate and rhythm Pulmonary: Lungs clear auscultation bilaterally Psych: Pleasant affect, spontaneous speech, good eye contact.  ASSESSMENT/PLAN:   Hypertension Blood pressure controlled off of her medications.  Continue to monitor.  Mood disorder [facility]) Patient states that she never heard from anybody after her last visit with Dr. Maudie Mercury regarding referral to  psychiatrist.  I discussed with patient options for uninsured patients that she does not currently know the status of her insurance.  I recommended family services of the [place].  I advised her she can walk-in or call them at any time for an appointment.  Migraine without aura and with status migrainosus, not intractable Patient not taking Topamax but is taking propranolol.  Unable to increase propranolol due to interactions with her inhaler.  Migraines still present but not interfering with day-to-day functioning.  Has an allergy to triptan.  No changes today.  Monitor.  Can consider alternative treatments if they become worse.  Closed nondisplaced fracture of right calcaneus with routine healing Occurred at work.  Being followed by podiatry for this issue.  Follow-up with PCP regarding this issue.     Benay Pike, MD [place]

## 2021-01-05 DIAGNOSIS — S92001D Unspecified fracture of right calcaneus, subsequent encounter for fracture with routine healing: Secondary | ICD-10-CM | POA: Insufficient documentation

## 2021-01-05 NOTE — Assessment & Plan Note (Signed)
Patient not taking Topamax but is taking propranolol.  Unable to increase propranolol due to interactions with her inhaler.  Migraines still present but not interfering with day-to-day functioning.  Has an allergy to triptan.  No changes today.  Monitor.  Can consider alternative treatments if they become worse.

## 2021-01-05 NOTE — Assessment & Plan Note (Signed)
Occurred at work.  Being followed by podiatry for this issue.  Follow-up with PCP regarding this issue.

## 2021-01-05 NOTE — Assessment & Plan Note (Signed)
Patient states that she never heard from anybody after her last visit with Dr. Maudie Mercury regarding referral to psychiatrist.  I discussed with patient options for uninsured patients that she does not currently know the status of her insurance.  I recommended family services of the [place].  I advised her she can walk-in or call them at any time for an appointment.

## 2021-01-05 NOTE — Assessment & Plan Note (Signed)
Blood pressure controlled off of her medications.  Continue to monitor.

## 2021-01-06 LAB — QUANTIFERON-TB GOLD PLUS
QuantiFERON Mitogen Value: >10 IU/mL
QuantiFERON Nil Value: 0 IU/mL
QuantiFERON TB1 Ag Value: 0 IU/mL
QuantiFERON TB2 Ag Value: 0 IU/mL
QuantiFERON-TB Gold Plus: NEGATIVE

## 2021-01-18 ENCOUNTER — Other Ambulatory Visit: Payer: Self-pay

## 2021-01-18 ENCOUNTER — Encounter: Payer: Self-pay | Admitting: Podiatry

## 2021-01-18 ENCOUNTER — Ambulatory Visit (INDEPENDENT_AMBULATORY_CARE_PROVIDER_SITE_OTHER): Payer: BC Managed Care – PPO | Admitting: Podiatry

## 2021-01-18 ENCOUNTER — Ambulatory Visit (INDEPENDENT_AMBULATORY_CARE_PROVIDER_SITE_OTHER): Payer: BC Managed Care – PPO

## 2021-01-18 DIAGNOSIS — M84374A Stress fracture, right foot, initial encounter for fracture: Secondary | ICD-10-CM

## 2021-01-18 DIAGNOSIS — M84374D Stress fracture, right foot, subsequent encounter for fracture with routine healing: Secondary | ICD-10-CM | POA: Diagnosis not present

## 2021-01-18 DIAGNOSIS — M79671 Pain in right foot: Secondary | ICD-10-CM

## 2021-01-18 DIAGNOSIS — M722 Plantar fascial fibromatosis: Secondary | ICD-10-CM

## 2021-01-19 ENCOUNTER — Other Ambulatory Visit: Payer: Self-pay | Admitting: Family Medicine

## 2021-01-19 ENCOUNTER — Telehealth: Payer: Self-pay | Admitting: *Deleted

## 2021-01-19 MED ORDER — OXCARBAZEPINE 600 MG PO TABS: 600.0000 mg | ORAL_TABLET | Freq: Two times a day (BID) | ORAL | 6 refills | Status: DC

## 2021-01-19 NOTE — Telephone Encounter (Signed)
Received fax requesting a refill on Ozcarbazepine.this is on current med list by a historical provider. Caralee Zimmerman Rumple, CMA

## 2021-01-19 NOTE — Progress Notes (Signed)
Subjective:   Patient ID: Mariah Rice, female   DOB: 50 y.o.   MRN: [id number]   HPI Patient states she is improving but she still is worried about going back to work and states she is not been wearing the boot as much but she still feels like as she gets out of it she needs something to hold up her arch   ROS      Objective:  Physical Exam  Neurovascular status intact with patient found to have moderate discomfort within the bulk of the calcaneus itself right and also discomfort in the plantar heel still present of a moderate nature      Assessment:  Probability for 2 separate problems with 1 being acute fasciitis and the other being stress fracture     Plan:  H&P x-ray reviewed condition discussed.  Will continue to reduce the boot I would like her to be able to return to work in the next couple weeks and today I dispensed a fascial brace to try to hold up the arch as she becomes more active.  She understands total recovery from this will be several more months but overall she has shown dramatic improvement  X-rays indicate that there is a probable stress fracture of the calcaneus and that there is no indications that the arch is depressed or other pathology is present

## 2021-01-24 ENCOUNTER — Ambulatory Visit (INDEPENDENT_AMBULATORY_CARE_PROVIDER_SITE_OTHER): Payer: BC Managed Care – PPO | Admitting: Podiatry

## 2021-01-24 ENCOUNTER — Encounter: Payer: Self-pay | Admitting: Podiatry

## 2021-01-24 ENCOUNTER — Other Ambulatory Visit: Payer: Self-pay

## 2021-01-24 ENCOUNTER — Other Ambulatory Visit: Payer: Self-pay | Admitting: Podiatry

## 2021-01-24 ENCOUNTER — Ambulatory Visit: Payer: Self-pay

## 2021-01-24 DIAGNOSIS — M84374A Stress fracture, right foot, initial encounter for fracture: Secondary | ICD-10-CM

## 2021-01-24 DIAGNOSIS — M722 Plantar fascial fibromatosis: Secondary | ICD-10-CM

## 2021-01-24 DIAGNOSIS — M84374D Stress fracture, right foot, subsequent encounter for fracture with routine healing: Secondary | ICD-10-CM

## 2021-01-24 NOTE — Progress Notes (Signed)
Subjective:   Patient ID: Mariah Rice, female   DOB: 50 y.o.   MRN: [id number]   HPI Patient presents stating she is developing a lot of pain in both her heels and stated she tried to go back to work 2 days ago but her feet had really been hurting her since.  States it feels like it is more in the bottom of the heel now but the bulk of the calcaneus can still bother her at times neurovascular   ROS      Objective:  Physical Exam  Neurovascular status intact with patient found to have exquisite discomfort now more in the plantar heels right and left with discomfort still within the bulk of the calcaneus right but diminished to quite a degree     Assessment:  Possibility for stress fracture right versus acute [place] fasciitis bilateral with return to work and moderate obesity      Plan:  Reviewed condition and at this point organ to try to treat as needed laboratory fasciitis and I did do sterile prep and injected the plantar fascial bilateral 3 mg Kenalog 5 mg Xylocaine.  Can utilize the boot for the right for the next few days and will get a hold off on work for 4 days and patient will be seen back to recheck

## 2021-01-30 NOTE — Progress Notes (Signed)
   Subjective:   Patient ID: Mariah Rice    DOB: 02-04-1971, 50 y.o. female   MRN: [id number]  Mariah Rice is a 50 y.o. female with a history of HTN, migraine without aura, sinus disorder, GERD, anxiety/depression, chronic bilateral low back pain without sciatica, COPD, b/l oopherectomy, obesity, prediabetes, tobacco abuse here for Back pain  Chronic Back Pain: Patient presents today with acute flare in her chronic left low back pain. She notes that it has bothered her more and the robaxin at the current dose isnt helping as much as it used to. Endorses seeing ortho in past and getting MRI. Has not followed up with them in a while. Denies bowel/bladder incontinence, saddle anesthesia, or neuropathy. Denies fevers/chills. Denies night sweats. Denies any rash.    Current medications: ibuprofen 600-800mg  TID, Robaxin 750mg  QID PRN  Review of Systems:  Per HPI.  Objective:     BP 120/86   Pulse 70   Ht 5\' 4"  (1.626 m)   Wt 219 lb 6.4 oz (99.5 kg) Comment: pt wearing a boot  LMP  (LMP Unknown) Comment: on Megace  SpO2 97%   BMI 37.66 kg/m  Vitals and nursing note reviewed.  General: pleasant older woman, sitting comfortably in exam chair, well nourished, well developed, in no acute distress with non-toxic appearance Resp: breathing comfortably on room air, speaking in full sentences Skin: warm, dry Extremities: warm and well perfused MSK: gait normal, see below Neuro: Alert and oriented, speech normal  Lumbar spine: - Inspection: no gross deformity or asymmetry, swelling or ecchymosis - Palpation: No TTP over the spinous processes or SI joints bilaterally. Tenderness to palpation along left paraspinal muscles - ROM: full active ROM of the lumbar spine in flexion and extension without pain - Strength: 5/5 strength of lower extremity in L4-S1 nerve root distributions b/l; normal gait - Neuro: sensation intact in the L4-S1 nerve root distribution b/l - Special testing: Negative  straight leg raise, negative slump, Negative FABER  Prior Imaging:  Lumbar spine 10/19/2018:  1. Slight curvature convex right. 2. 2 mm anterior slip L4 secondary to facet degenerative changes. No pars defect noted. Minimal narrowing posterior aspect L4-5 disc space. 3. Mild narrowing L5-S1 disc space.   Assessment & Plan:   Chronic bilateral low back pain without sciatica Acute on chornic. Primairly located along left paraspinal muscle today. Gets relief with Robaxin. No red flag symptoms.  - Recommended Ibuprofen 400-600mg  every 6 hours for 2 weeks. Use tylenol 325-650mg  in between ibuprofen doses - Increase Robaxin to 1500mg  up to 4 times a day x 4 days, then go back to 750mg  4 times a day as needed. - Use heat as often as possible - Continue to move and walk but avoid aggravating movements - Voltaren gel, Icy-hot, capsaicin cream  - Follow up 4-6 weeks if no improvement, sooner if worsening - Can consider repeat imaging and referral to Ortho for further evaluation   No orders of the defined types were placed in this encounter.  No orders of the defined types were placed in this encounter.   Mina Marble, DO PGY-3, [facility] 02/02/2021 8:47 PM

## 2021-01-31 ENCOUNTER — Encounter: Payer: Self-pay | Admitting: Family Medicine

## 2021-01-31 ENCOUNTER — Ambulatory Visit (INDEPENDENT_AMBULATORY_CARE_PROVIDER_SITE_OTHER): Payer: Self-pay | Admitting: Family Medicine

## 2021-01-31 ENCOUNTER — Other Ambulatory Visit: Payer: Self-pay

## 2021-01-31 DIAGNOSIS — M545 Low back pain, unspecified: Secondary | ICD-10-CM

## 2021-01-31 DIAGNOSIS — G8929 Other chronic pain: Secondary | ICD-10-CM

## 2021-01-31 NOTE — Patient Instructions (Signed)
It was a pleasure to see you today!  Thank you for choosing [facility] for your primary care.   Our plans for today were:  To treat your back pain, use Ibuprofen 400-600mg  every 6 hours for 2 weeks. Use tylenol 325-650mg  in between ibuprofen doses.   You can increase the Robaxin to 1500mg  (two 750mg  tablets) up to 4 times a day x 4 days, then go back to 750mg  4 times a day as needed.  Use heat as often as you can  Continue to move and walk but avoid aggravating movements  Voltaren gel, Icy-hot, capsaicin cream   Follow up 4-6 weeks if no improvement, sooner if worsening  Can consider repeat imaging and referral to Ortho for further evaluation    Best Wishes,   Mina Marble, DO

## 2021-02-02 NOTE — Assessment & Plan Note (Signed)
Acute on chornic. Primairly located along left paraspinal muscle today. Gets relief with Robaxin. No red flag symptoms.  - Recommended Ibuprofen 400-600mg  every 6 hours for 2 weeks. Use tylenol 325-650mg  in between ibuprofen doses - Increase Robaxin to 1500mg  up to 4 times a day x 4 days, then go back to 750mg  4 times a day as needed. - Use heat as often as possible - Continue to move and walk but avoid aggravating movements - Voltaren gel, Icy-hot, capsaicin cream  - Follow up 4-6 weeks if no improvement, sooner if worsening - Can consider repeat imaging and referral to Ortho for further evaluation

## 2021-02-08 ENCOUNTER — Ambulatory Visit (INDEPENDENT_AMBULATORY_CARE_PROVIDER_SITE_OTHER): Payer: Self-pay | Admitting: Podiatry

## 2021-02-08 ENCOUNTER — Other Ambulatory Visit: Payer: Self-pay

## 2021-02-08 DIAGNOSIS — M722 Plantar fascial fibromatosis: Secondary | ICD-10-CM

## 2021-02-08 DIAGNOSIS — M84374A Stress fracture, right foot, initial encounter for fracture: Secondary | ICD-10-CM

## 2021-02-08 NOTE — Progress Notes (Signed)
Subjective:   Patient ID: Mariah Rice, female   DOB: 50 y.o.   MRN: [id number]   HPI Patient states she continues to have heel pain right that is been bothersome for her and states that it seems to be more in the bottom of the heel that within the bulk of the heel bone   ROS      Objective:  Physical Exam  Neurovascular status intact with exquisite discomfort more within the plantar fascial right with mild discomfort within the bone itself     Assessment:  Acute plantar fasciitis right with possibility still for some irritation of bone structure     Plan:  H&P reviewed condition recommended the consideration for surgical intervention and reviewed at great length surgical endoscopic surgery explained this may or may not solve her problems.  She wants to proceed but unfortunately her current insurance will not allow her to have this done so she will have to hold off until she can change insurances.  I offered self-pay that does not work for her I offered MRI that does not work for her from a Marine scientist.  Patient will continue boot usage stretching exercises ice therapy at the current time

## 2021-02-09 ENCOUNTER — Telehealth: Payer: Self-pay | Admitting: Podiatry

## 2021-02-09 ENCOUNTER — Encounter: Payer: Self-pay | Admitting: Podiatry

## 2021-02-09 NOTE — Telephone Encounter (Signed)
How much longer is patient expected  to be out of work? Patient is currently working on insurance plan to pay for surgery. But, she is still having a problem bearing weight on foot.

## 2021-02-09 NOTE — Telephone Encounter (Signed)
I'm not sure.  

## 2021-02-22 ENCOUNTER — Encounter: Payer: Self-pay | Admitting: Family Medicine

## 2021-02-22 MED ORDER — PRAVASTATIN SODIUM 40 MG PO TABS: 40.0000 mg | ORAL_TABLET | Freq: Every day | ORAL | 3 refills | Status: DC

## 2021-02-27 ENCOUNTER — Ambulatory Visit: Payer: 59 | Admitting: Podiatry

## 2021-02-28 ENCOUNTER — Telehealth: Payer: Self-pay | Admitting: Podiatry

## 2021-02-28 NOTE — Telephone Encounter (Signed)
Patient would like for medical necessity paperwork to be sent into insurance company with all supporting documentation for surgery approval, Patient stated she is still experiencing severe pain and would like to exhaust all options to help subside pain, Please Advise

## 2021-03-05 ENCOUNTER — Ambulatory Visit (INDEPENDENT_AMBULATORY_CARE_PROVIDER_SITE_OTHER): Payer: 59 | Admitting: Podiatry

## 2021-03-05 ENCOUNTER — Encounter: Payer: Self-pay | Admitting: Podiatry

## 2021-03-05 ENCOUNTER — Other Ambulatory Visit: Payer: Self-pay | Admitting: *Deleted

## 2021-03-05 ENCOUNTER — Other Ambulatory Visit: Payer: Self-pay

## 2021-03-05 DIAGNOSIS — M722 Plantar fascial fibromatosis: Secondary | ICD-10-CM | POA: Diagnosis not present

## 2021-03-05 MED ORDER — ESTRADIOL 2 MG PO TABS: 2.0000 mg | ORAL_TABLET | Freq: Every day | ORAL | 3 refills | Status: DC

## 2021-03-05 MED ORDER — TRAMADOL HCL 50 MG PO TABS: 50.0000 mg | ORAL_TABLET | Freq: Three times a day (TID) | ORAL | 2 refills | Status: DC

## 2021-03-05 NOTE — Progress Notes (Signed)
Subjective:   Patient ID: Mariah Rice, female   DOB: 50 y.o.   MRN: [id number]   HPI Patient presents stating she continues to have severe discomfort in the right plantar fascia.  States it is been present now for close to a year and has failed to respond to numerous conservative treatments.  Patient has utilize shoe gear modification has utilized oral anti-inflammatories ice therapy reduced activity and we have utilized cast immobilization injection treatment and offloading.  Patient has not been able to work and the pain is quite debilitating plantar aspect right heel   ROS      Objective:  Physical Exam  Severe plantar fasciitis right failure to respond to conservative aggressive treatment     Assessment:  Severe discomfort right plantar fascia insertional point tendon calcaneus     Plan:  H&P reviewed condition and her frustration with the length of time she has had this in her inability to work.  At this point organ to try tramadol continue ice continue immobilization until she is able to have surgical intervention of which we have already done consent form and reviewed what will be required

## 2021-03-06 ENCOUNTER — Other Ambulatory Visit: Payer: Self-pay | Admitting: Sports Medicine

## 2021-03-06 ENCOUNTER — Telehealth: Payer: Self-pay | Admitting: *Deleted

## 2021-03-06 DIAGNOSIS — S92001D Unspecified fracture of right calcaneus, subsequent encounter for fracture with routine healing: Secondary | ICD-10-CM

## 2021-03-06 MED ORDER — TRAMADOL HCL 50 MG PO TABS: 50.0000 mg | ORAL_TABLET | Freq: Three times a day (TID) | ORAL | 0 refills | Status: AC

## 2021-03-06 NOTE — Telephone Encounter (Signed)
Patient is calling because her prescription for Tramadol-50 mg was not sent to her pharmacy, is showing that it was in Mychart,called them and they have not received. Can you resend to pharmacy on file,doctor is in surgery?

## 2021-03-06 NOTE — Telephone Encounter (Signed)
Called patient and informed per Dr Leeanne Rio note, verbalized understanding.

## 2021-03-06 NOTE — Telephone Encounter (Signed)
I resent it to her pharmacy for 1 week of the tramadol only. If she needs it for longer then she will have to get Dr. Paulla Dolly to send in more for her  Thanks Dr. Chauncey Cruel

## 2021-03-06 NOTE — Progress Notes (Signed)
Refilled tramadol for 1 week -Dr. Chauncey Cruel

## 2021-03-07 ENCOUNTER — Telehealth: Payer: Self-pay | Admitting: *Deleted

## 2021-03-07 ENCOUNTER — Other Ambulatory Visit: Payer: Self-pay | Admitting: Sports Medicine

## 2021-03-07 DIAGNOSIS — S92001D Unspecified fracture of right calcaneus, subsequent encounter for fracture with routine healing: Secondary | ICD-10-CM

## 2021-03-07 MED ORDER — HYDROCODONE-ACETAMINOPHEN 10-325 MG PO TABS: 1.0000 | ORAL_TABLET | Freq: Four times a day (QID) | ORAL | 0 refills | Status: AC | PRN

## 2021-03-07 NOTE — Telephone Encounter (Signed)
Patient is calling and said that the tramadol prescribed  is not helping w/ pain after taking all day and this morning. Please advise.

## 2021-03-07 NOTE — Telephone Encounter (Signed)
Called and spoke with patient to inform per Dr Leeanne Rio note, verbalized understanding.

## 2021-03-07 NOTE — Telephone Encounter (Signed)
Returned call to patient to give message per Dr Cannon Kettle, could not leave V message, no answer.

## 2021-03-07 NOTE — Telephone Encounter (Signed)
Changed Tramadol to Norco to see if this will give her additional relief until she can make a follow up appt with [facility] for re-evaluation

## 2021-03-07 NOTE — Progress Notes (Signed)
Changed Tramadol to Norco to see if this will give her additional relief until she can make a follow up appt with [facility] for re-evaluation

## 2021-03-08 ENCOUNTER — Ambulatory Visit: Payer: Medicaid Other | Admitting: Podiatry

## 2021-03-08 NOTE — Telephone Encounter (Signed)
completed

## 2021-03-12 ENCOUNTER — Encounter: Payer: Medicaid Other | Admitting: Podiatry

## 2021-03-26 ENCOUNTER — Ambulatory Visit (INDEPENDENT_AMBULATORY_CARE_PROVIDER_SITE_OTHER): Payer: 59 | Admitting: Podiatry

## 2021-03-26 ENCOUNTER — Encounter: Payer: Self-pay | Admitting: Podiatry

## 2021-03-26 ENCOUNTER — Other Ambulatory Visit: Payer: Self-pay

## 2021-03-26 DIAGNOSIS — M722 Plantar fascial fibromatosis: Secondary | ICD-10-CM

## 2021-03-26 MED ORDER — HYDROCODONE-ACETAMINOPHEN 10-325 MG PO TABS: 1.0000 | ORAL_TABLET | Freq: Four times a day (QID) | ORAL | 0 refills | Status: AC | PRN

## 2021-03-27 NOTE — Progress Notes (Signed)
Subjective:   Patient ID: Mariah Rice, female   DOB: 50 y.o.   MRN: [id number]   HPI Patient continues to experience severe discomfort plantar aspect right heel and comes in wearing her walking boot that she has been in with treatment this been going on now for the last 5 months.  Very tender when pressed into the plantar fascial right with failure to respond to numerous conservative care including injection immobilization anti-inflammatories physical therapy   ROS      Objective:  Physical Exam  Severe discomfort medial fascial band right heel at the insertional point tendon calcaneus     Assessment:  Acute plantar fasciitis right     Plan:  Discussed continued immobilization once point in future endoscopic release of the fascia when she can get approval for this but at this point we continue to manage as best we can with immobilization and I did write her a small prescription of narcotic medication as it helps her sleep at night and helps her manage the pain

## 2021-03-29 ENCOUNTER — Other Ambulatory Visit: Payer: Self-pay

## 2021-03-29 MED ORDER — METHOCARBAMOL 750 MG PO TABS: 750.0000 mg | ORAL_TABLET | Freq: Four times a day (QID) | ORAL | 2 refills | Status: DC

## 2021-04-13 ENCOUNTER — Encounter: Payer: Self-pay | Admitting: Podiatry

## 2021-04-19 ENCOUNTER — Telehealth: Payer: Self-pay | Admitting: Podiatry

## 2021-04-19 ENCOUNTER — Other Ambulatory Visit: Payer: Self-pay | Admitting: Podiatry

## 2021-04-19 MED ORDER — HYDROCODONE-ACETAMINOPHEN 10-325 MG PO TABS: 1.0000 | ORAL_TABLET | Freq: Three times a day (TID) | ORAL | 0 refills | Status: AC | PRN

## 2021-04-19 NOTE — Telephone Encounter (Signed)
Patient called our office stating she would like a refill of   hydrocodone-acetaminophem.

## 2021-04-19 NOTE — Telephone Encounter (Signed)
done

## 2021-04-19 NOTE — Telephone Encounter (Signed)
I called Mariah Rice back to let her know that her prescription has been refilled.

## 2021-04-20 ENCOUNTER — Telehealth: Payer: Self-pay | Admitting: Podiatry

## 2021-04-20 NOTE — Telephone Encounter (Signed)
Pt called stating her pharmacy never received order for medication. Please advise.

## 2021-04-23 ENCOUNTER — Other Ambulatory Visit: Payer: Self-pay | Admitting: Podiatry

## 2021-04-23 ENCOUNTER — Telehealth: Payer: Self-pay | Admitting: *Deleted

## 2021-04-23 MED ORDER — HYDROCODONE-ACETAMINOPHEN 10-325 MG PO TABS: 1.0000 | ORAL_TABLET | Freq: Three times a day (TID) | ORAL | 0 refills | Status: AC | PRN

## 2021-04-23 NOTE — Telephone Encounter (Signed)
Informed patient thru vmessage that medication is ready for pick up.

## 2021-04-23 NOTE — Telephone Encounter (Signed)
Patient is calling for status of a prescription that was suppose to be sent to her pharmacy on file but is not there.  Called the pharmacy and confirmed that it was not sent but showing as sent in epic. Please advise and resend to pharmacy listed.

## 2021-04-23 NOTE — Telephone Encounter (Signed)
Sent in again 

## 2021-04-30 ENCOUNTER — Ambulatory Visit (INDEPENDENT_AMBULATORY_CARE_PROVIDER_SITE_OTHER): Payer: 59 | Admitting: Podiatry

## 2021-04-30 ENCOUNTER — Encounter: Payer: Self-pay | Admitting: Podiatry

## 2021-04-30 ENCOUNTER — Other Ambulatory Visit: Payer: Self-pay

## 2021-04-30 DIAGNOSIS — M722 Plantar fascial fibromatosis: Secondary | ICD-10-CM

## 2021-04-30 NOTE — Progress Notes (Signed)
Subjective:   Patient ID: Mariah Rice, female   DOB: 50 y.o.   MRN: [id number]   HPI Patient states the right heel has been very tender and I am still getting pain of a severe nature and then getting pain now on the outside of the foot from walking differently in the plantar heel left is getting quite sore   ROS      Objective:  Physical Exam  No neurovascular status intact with chronic discomfort of the medial band plantar fascial right and pain now developing left and compensation lateral side right foot     Assessment:  Chronic [place] fasciitis right that is failed to respond to numerous conservative treatments along with beginnings of fasciitis left     Plan:  H&P reviewed condition discussed endoscopic surgery explained absolutely no long-term guarantees this will solve all her problems given the length of time she is added and difficulty but she wants to undergo surgery and is scheduled for outpatient surgery and later on we will inject the left to try to get it under control.  Patient is encouraged to call questions concerns prior to procedure and we reviewed the previous consent form that she had signed

## 2021-05-02 ENCOUNTER — Telehealth: Payer: Self-pay | Admitting: Urology

## 2021-05-02 NOTE — Telephone Encounter (Signed)
DOS - 05/08/21   EPF RIGHT --- 49702 INJECTION LEFT HEEL --- 20550   Friday HEALTH PLAN EFFECTIVE DATE - 02/08/21   RECEIVED FAX FROM [facility] STATING THAT FOR CPT CODE 63785 NO PRIOR AUTH IS REQUIRED. FOR CPT CODE 88502 HAS BEEN APPROVED, AUTH # [id number], GOOD FROM 04/24/21 - 07/25/21.

## 2021-05-07 MED ORDER — ONDANSETRON HCL 4 MG PO TABS: 4.0000 mg | ORAL_TABLET | Freq: Three times a day (TID) | ORAL | 0 refills | Status: DC | PRN

## 2021-05-07 MED ORDER — OXYCODONE-ACETAMINOPHEN 10-325 MG PO TABS: 1.0000 | ORAL_TABLET | ORAL | 0 refills | Status: DC | PRN

## 2021-05-07 NOTE — Addendum Note (Signed)
Addended by: Wallene Huh on: 05/07/2021 01:22 PM   Modules accepted: Orders

## 2021-05-08 ENCOUNTER — Encounter: Payer: Self-pay | Admitting: Podiatry

## 2021-05-08 DIAGNOSIS — M722 Plantar fascial fibromatosis: Secondary | ICD-10-CM

## 2021-05-16 ENCOUNTER — Ambulatory Visit (INDEPENDENT_AMBULATORY_CARE_PROVIDER_SITE_OTHER): Payer: 59 | Admitting: Podiatry

## 2021-05-16 ENCOUNTER — Encounter: Payer: Self-pay | Admitting: Podiatry

## 2021-05-16 ENCOUNTER — Other Ambulatory Visit: Payer: Self-pay

## 2021-05-16 DIAGNOSIS — M722 Plantar fascial fibromatosis: Secondary | ICD-10-CM | POA: Diagnosis not present

## 2021-05-16 MED ORDER — OXYCODONE-ACETAMINOPHEN 10-325 MG PO TABS: 1.0000 | ORAL_TABLET | ORAL | 0 refills | Status: DC | PRN

## 2021-05-16 MED ORDER — ONDANSETRON HCL 4 MG PO TABS: 4.0000 mg | ORAL_TABLET | Freq: Three times a day (TID) | ORAL | 0 refills | Status: DC | PRN

## 2021-05-17 NOTE — Progress Notes (Signed)
Subjective:   Patient ID: Mariah Rice, female   DOB: 50 y.o.   MRN: [id number]   HPI Patient presents stating she is doing well with surgery still having pain and needs more medication but overall pleased   ROS      Objective:  Physical Exam  Neurovascular status intact negative Bevelyn Buckles' sign noted right incision sites healing well wound edges well coapted minimal plantar pain noted wearing boot with discomfort left improved     Assessment:  Doing well post endoscopic surgery right injection left     Plan:  H&P reapplied sterile dressing continue immobilization elevation compression reappoint 2 weeks suture removal and did put her on more pain medication for the short period patient will be seen back earlier if any issues were to recur

## 2021-05-30 ENCOUNTER — Encounter: Payer: Self-pay | Admitting: Podiatry

## 2021-05-30 ENCOUNTER — Ambulatory Visit (INDEPENDENT_AMBULATORY_CARE_PROVIDER_SITE_OTHER): Payer: 59 | Admitting: Podiatry

## 2021-05-30 ENCOUNTER — Other Ambulatory Visit: Payer: Self-pay

## 2021-05-30 DIAGNOSIS — M722 Plantar fascial fibromatosis: Secondary | ICD-10-CM | POA: Diagnosis not present

## 2021-05-30 NOTE — Progress Notes (Signed)
Subjective:   Patient ID: Mariah Rice, female   DOB: 50 y.o.   MRN: [id number]   HPI Patient presents stating she is doing well with moderate discomfort still noted and still needs to wear her boot for ambulation   ROS      Objective:  Physical Exam  Neurovascular status intact with patient's incision sites healing well wound edges coapted well moderate plantar pain     Assessment:  Doing well endoscopic surgery right still having pain     Plan:  Stitches removed and ankle compression stocking dispensed gradual return to soft shoe gear over the next 2 weeks and hopeful return to work in 4 weeks.  Patient encouraged to call questions concerns which may arise and understands total recovery will still take several more months

## 2021-06-02 ENCOUNTER — Other Ambulatory Visit: Payer: Self-pay | Admitting: Family Medicine

## 2021-06-06 ENCOUNTER — Other Ambulatory Visit: Payer: Self-pay | Admitting: Podiatry

## 2021-06-06 ENCOUNTER — Telehealth: Payer: Self-pay | Admitting: *Deleted

## 2021-06-06 NOTE — Telephone Encounter (Signed)
Patient is calling for a stronger pain medicine, very painful with walking and ibuprofen is not helping. Please advise.

## 2021-06-06 NOTE — Progress Notes (Unsigned)
hyd

## 2021-06-07 ENCOUNTER — Other Ambulatory Visit: Payer: Self-pay | Admitting: Podiatry

## 2021-06-07 ENCOUNTER — Telehealth: Payer: Self-pay | Admitting: Podiatry

## 2021-06-07 MED ORDER — HYDROCODONE-ACETAMINOPHEN 10-325 MG PO TABS: 1.0000 | ORAL_TABLET | Freq: Four times a day (QID) | ORAL | 0 refills | Status: AC | PRN

## 2021-06-07 NOTE — Telephone Encounter (Signed)
I sent in prescription for pain medication to take for next week

## 2021-06-07 NOTE — Telephone Encounter (Signed)
Returned call to patient, no answer, left vmessage that medication request has been approved and sent to pharmacy on file.

## 2021-06-07 NOTE — Telephone Encounter (Signed)
Patient called our office today stating that the prescription that was prescribe to her is out of stock at her preferred pharmacy but they told her it is in stock at the [facility] . But they would need a new order for her to pick it up there.

## 2021-06-11 NOTE — Telephone Encounter (Signed)
I don't have the pharmacy in the computer to send

## 2021-06-11 NOTE — Telephone Encounter (Signed)
I have updated the pharmacy in the computer to [place] , [place] [address]

## 2021-06-13 ENCOUNTER — Other Ambulatory Visit: Payer: Self-pay | Admitting: Podiatry

## 2021-06-13 MED ORDER — HYDROCODONE-ACETAMINOPHEN 10-325 MG PO TABS: 1.0000 | ORAL_TABLET | Freq: Three times a day (TID) | ORAL | 0 refills | Status: AC | PRN

## 2021-06-15 DIAGNOSIS — M79676 Pain in unspecified toe(s): Secondary | ICD-10-CM

## 2021-06-28 ENCOUNTER — Ambulatory Visit (INDEPENDENT_AMBULATORY_CARE_PROVIDER_SITE_OTHER): Payer: 59 | Admitting: Podiatry

## 2021-06-28 ENCOUNTER — Encounter: Payer: Self-pay | Admitting: Podiatry

## 2021-06-28 ENCOUNTER — Other Ambulatory Visit: Payer: Self-pay

## 2021-06-28 ENCOUNTER — Ambulatory Visit (INDEPENDENT_AMBULATORY_CARE_PROVIDER_SITE_OTHER): Payer: 59

## 2021-06-28 DIAGNOSIS — S99921A Unspecified injury of right foot, initial encounter: Secondary | ICD-10-CM

## 2021-07-01 NOTE — Progress Notes (Signed)
Subjective:   Patient ID: Mariah Rice, female   DOB: 50 y.o.   MRN: [id number]   HPI Patient presents stating that her right foot heel is doing very well but she is developed some pain in the outside and top of the foot   ROS      Objective:  Physical Exam  Neurovascular status intact muscle strength adequate patient is found to have minimal discomfort plantar heel doing better with mild discomfort lateral but improved from previous     Assessment:  Doing very well with probable compensatory pain as she continues to recover from this chronic condition she experienced 8     Plan:  NP reviewed condition recommended ice therapy anti-inflammatory support therapy and may require more aggressive treatment if symptoms were to persist.  Continue anti-inflammatories and continue shoe gear modifications  X-rays were negative for signs of fracture or bony encroachment associated with condition

## 2021-07-05 ENCOUNTER — Other Ambulatory Visit: Payer: Self-pay | Admitting: Family Medicine

## 2021-07-24 ENCOUNTER — Encounter: Payer: Self-pay | Admitting: Gastroenterology

## 2021-07-24 ENCOUNTER — Other Ambulatory Visit (INDEPENDENT_AMBULATORY_CARE_PROVIDER_SITE_OTHER): Payer: 59

## 2021-07-24 ENCOUNTER — Ambulatory Visit (INDEPENDENT_AMBULATORY_CARE_PROVIDER_SITE_OTHER): Payer: 59 | Admitting: Gastroenterology

## 2021-07-24 VITALS — BP 140/82 | HR 72 | Ht 64.0 in | Wt 217.8 lb

## 2021-07-24 DIAGNOSIS — R194 Change in bowel habit: Secondary | ICD-10-CM

## 2021-07-24 DIAGNOSIS — R1084 Generalized abdominal pain: Secondary | ICD-10-CM | POA: Diagnosis not present

## 2021-07-24 DIAGNOSIS — R112 Nausea with vomiting, unspecified: Secondary | ICD-10-CM

## 2021-07-24 DIAGNOSIS — R6881 Early satiety: Secondary | ICD-10-CM

## 2021-07-24 DIAGNOSIS — R140 Abdominal distension (gaseous): Secondary | ICD-10-CM

## 2021-07-24 LAB — COMPREHENSIVE METABOLIC PANEL
ALT: 16 U/L (ref 0–35)
AST: 13 U/L (ref 0–37)
Albumin: 3.9 g/dL (ref 3.5–5.2)
Alkaline Phosphatase: 83 U/L (ref 39–117)
BUN: 8 mg/dL (ref 6–23)
CO2: 23 mEq/L (ref 19–32)
Calcium: 8.8 mg/dL (ref 8.4–10.5)
Chloride: 95 mEq/L — ABNORMAL LOW (ref 96–112)
Creatinine, Ser: 0.73 mg/dL (ref 0.40–1.20)
GFR: 97.63 mL/min (ref >60.00)
Glucose, Bld: 96 mg/dL (ref 70–99)
Potassium: 3.8 mEq/L (ref 3.5–5.1)
Sodium: 125 mEq/L — ABNORMAL LOW (ref 135–145)
Total Bilirubin: 0.3 mg/dL (ref 0.2–1.2)
Total Protein: 6.9 g/dL (ref 6.0–8.3)

## 2021-07-24 LAB — CBC WITH DIFFERENTIAL/PLATELET
Basophils Absolute: 0.1 10*3/uL (ref 0.0–0.1)
Basophils Relative: 1.3 % (ref 0.0–3.0)
Eosinophils Absolute: 0.1 10*3/uL (ref 0.0–0.7)
Eosinophils Relative: 1.4 % (ref 0.0–5.0)
HCT: 42.3 % (ref 36.0–46.0)
Hemoglobin: 14.4 g/dL (ref 12.0–15.0)
Lymphocytes Relative: 27.7 % (ref 12.0–46.0)
Lymphs Abs: 2.2 10*3/uL (ref 0.7–4.0)
MCHC: 34.1 g/dL (ref 30.0–36.0)
MCV: 92.1 fl (ref 78.0–100.0)
Monocytes Absolute: 0.7 10*3/uL (ref 0.1–1.0)
Monocytes Relative: 9.2 % (ref 3.0–12.0)
Neutro Abs: 4.8 10*3/uL (ref 1.4–7.7)
Neutrophils Relative %: 60.4 % (ref 43.0–77.0)
Platelets: 270 10*3/uL (ref 150.0–400.0)
RBC: 4.59 Mil/uL (ref 3.87–5.11)
RDW: 14.8 % (ref 11.5–15.5)
WBC: 8 10*3/uL (ref 4.0–10.5)

## 2021-07-24 NOTE — Progress Notes (Signed)
[facility] Progress Note  Chief Complaint: Generalized abdominal pain and altered bowel habit  Subjective  History: Mariah Rice was seen in January 2020 with persistent upper abdominal pain, nausea and vomiting.  Upper endoscopy 01/28/2019 normal, biopsies negative for H. pylori.  Subsequent CT scan with report as below indicating P SBO, possibly from adhesions.  Small bowel enteroscopy on 05/12/2019 did not reach any visible pathology. Saw Mariah Rice at [facility] and planned surgery, but patient lost insurance.  Mariah Rice was here to reestablish care for the same problems.  She feels things are slowly getting worse since I last saw her.  She has generalized abdominal pain, and her bowel habits are never normal.  At times she feels constipated and distended, then will have urgent loose stools.  She often feels full easily and still has intermittent vomiting.  Her weight and abdominal girth have been increasing over the last year as well.   ROS: Cardiovascular:  no chest pain Respiratory: Dry cough (chronic smoker) Anxiety Headaches  The patient's Past Medical, Family and Social History were reviewed and are on file in the EMR. Past Medical History:  Diagnosis Date   Abnormal finding on GI tract imaging    Anaphylaxis 07/26/2020   Anxiety    COPD (chronic obstructive pulmonary disease) (HCC)    COPD exacerbation ([place]) 07/17/2020   Dental abscess    Depression    Family history of breast cancer    Family history of breast cancer 01/26/2019   Family history of ovarian cancer    Genetic testing 03/08/2019   Negative genetic testing on the CancerNext-Expanded+RNAinsight testing.  The CancerNext-Expanded gene panel offered by [facility] and includes sequencing and rearrangement analysis for the following 67 genes: AIP, ALK, APC*, ATM*, BAP1, BARD1, BLM, BMPR1A, BRCA1*, BRCA2*, BRIP1*, CDH1*, CDK4, CDKN1B, CDKN2A, CHEK2*, DICER1, FANCC, FH, FLCN, GALNT12, HOXB13, MAX, MEN1, MET, MLH1*,  MRE11A, MSH2*   GERD (gastroesophageal reflux disease)    Headache    migraines   Hypertension    Hyperthyroidism 1990s   PONV (postoperative nausea and vomiting)    Shortness of breath dyspnea    with anxiety   Small bowel obstruction (HCC)    Tick bite of abdomen 05/13/2017   Past Surgical History:  Procedure Laterality Date   ENTEROSCOPY N/A 05/12/2019   Procedure: ENTEROSCOPY;  Surgeon: Doran Stabler, MD;  Location: [facility];  Service: Gastroenterology;  Laterality: N/A;   FOOT SURGERY Right    INCISE AND DRAIN ABCESS  2005   LAPAROSCOPIC ASSISTED VAGINAL HYSTERECTOMY N/A 07/11/2015   Procedure: LAPAROSCOPIC ASSISTED VAGINAL HYSTERECTOMY;  Surgeon: Osborne Oman, MD;  Location: [facility];  Service: Gynecology;  Laterality: N/A;   LAPAROSCOPIC BILATERAL SALPINGECTOMY Bilateral 07/11/2015   Procedure: LAPAROSCOPIC BILATERAL SALPINGO OOPHORECTOMY ;  Surgeon: Osborne Oman, MD;  Location: [facility];  Service: Gynecology;  Laterality: Bilateral;   tubal ligation  2000   WISDOM TOOTH EXTRACTION      Objective:  Med list reviewed  Current Outpatient Medications:    cetirizine (ZYRTEC) 10 MG tablet, Take 1 tablet (10 mg total) by mouth daily., Disp: 30 tablet, Rfl: 11   EPINEPHrine 0.3 mg/0.3 mL IJ SOAJ injection, Inject 0.3 mLs (0.3 mg total) into the muscle as needed for anaphylaxis (for difficulty breathing, throat, tongue or lip swelling. must come to ED after use.)., Disp: 1 each, Rfl: 0   estradiol (ESTRACE) 2 MG tablet, Take 1 tablet (2 mg total) by mouth daily., Disp: 90 tablet,  Rfl: 3   ibuprofen (ADVIL) 800 MG tablet, Take 800 mg by mouth 3 (three) times daily., Disp: , Rfl:    methocarbamol (ROBAXIN) 750 MG tablet, TAKE 1 TABLET BY MOUTH FOUR TIMES A DAY, Disp: 30 tablet, Rfl: 2   omeprazole (PRILOSEC) 20 MG capsule, Take 1 capsule (20 mg total) by mouth daily., Disp: 90 capsule, Rfl: 3   ondansetron (ZOFRAN) 4 MG tablet, Take 1 tablet (4 mg total) by mouth every 8  (eight) hours as needed for nausea or vomiting., Disp: 20 tablet, Rfl: 0   oxcarbazepine (TRILEPTAL) 600 MG tablet, Take 1 tablet (600 mg total) by mouth 2 (two) times daily., Disp: 120 tablet, Rfl: 6   pravastatin (PRAVACHOL) 40 MG tablet, Take 1 tablet (40 mg total) by mouth daily., Disp: 90 tablet, Rfl: 3   propranolol ER (INDERAL LA) 60 MG 24 hr capsule, Take 1 capsule (60 mg total) by mouth daily., Disp: 90 capsule, Rfl: 3   SPIRIVA RESPIMAT 2.5 MCG/ACT AERS, Inhale 2 puffs into the lungs daily., Disp: 4 g, Rfl: 11   Vital signs in last 24 hrs: Vitals:   07/24/21 1503  BP: 140/82  Pulse: 72  SpO2: 99%   Wt Readings from Last 3 Encounters:  07/24/21 217 lb 12.8 oz (98.8 kg)  01/31/21 219 lb 6.4 oz (99.5 kg)  01/04/21 208 lb (94.3 kg)    Physical Exam  [place] vocal quality.  Not acutely ill-appearing HEENT: sclera anicteric, oral mucosa moist without lesions Neck: supple, no thyromegaly, JVD or lymphadenopathy Cardiac: RRR without murmurs, S1S2 heard, no peripheral edema Pulm: clear to auscultation bilaterally, normal RR and effort noted Abdomen: soft, obese, mild generalized tenderness, with active bowel sounds. Abdomen softly distended and tympanitic, no shifting dullness or fluid wave appreciated. Skin; warm and dry, no jaundice or rash  Labs:  No labs  ___________________________________________ Radiologic studies:  CLINICAL DATA:  Abdominal pain, nausea and vomiting, and unintentional weight loss.   EXAM: CT ABDOMEN AND PELVIS WITH CONTRAST   TECHNIQUE: Multidetector CT imaging of the abdomen and pelvis was performed using the standard protocol following bolus administration of intravenous contrast.   CONTRAST:  161m OMNIPAQUE IOHEXOL 300 MG/ML  SOLN   [facility] Chest: No acute findings.   Hepatobiliary: No hepatic masses identified. Gallbladder is unremarkable.   Pancreas:  No mass or  inflammatory changes.   Spleen: Within normal limits in size and appearance.   Adrenals/Urinary Tract: No masses identified. Several small less than 5 mm bilateral renal calculi are seen. No evidence of ureteral calculi or hydronephrosis. Unremarkable unopacified urinary bladder.   Stomach/Bowel: Moderately dilated proximal small bowel loops are seen with transition point in the left lower abdomen. This is consistent with a partial small bowel obstruction, and may be due to adhesion. Small bowel wall thickening is seen involving the proximal jejunum, suspicious for enteritis. No evidence of pneumatosis.   Vascular/Lymphatic: No pathologically enlarged lymph nodes. No abdominal aortic aneurysm. Aortic atherosclerosis.   Reproductive: Prior hysterectomy noted. Adnexal regions are unremarkable in appearance.   Other:  None.   Musculoskeletal:  No suspicious bone lesions identified.   IMPRESSION: 1. Partial small bowel obstruction, with transition point in left lower abdomen. This may be due to adhesion. 2. Small bowel wall thickening involving proximal jejunum, suspicious for enteritis. No evidence of pneumatosis or free air. 3. Bilateral nephrolithiasis. No evidence of ureteral calculi or hydronephrosis.   Aortic Atherosclerosis (ICD10-I70.0).  Electronically Signed   By: Earle Gell M.D.   On: 04/23/2019 11:46    ____________________________________________ Other:   _____________________________________________ Assessment & Plan  Assessment: Encounter Diagnoses  Name Primary?   Generalized abdominal pain Yes   Nausea and vomiting in adult    Abdominal distension    Change in bowel habits    Early satiety    Mariah Rice was found to have partial small bowel obstruction over 2 years ago and was unable to have surgical intervention, and symptoms of steadily worsened. Her only prior surgery is hysterectomy and oophorectomy, but certainly could have adhesions related to  that.   Plan: Labs today: CBC, CMP  CT abdomen and pelvis as soon as possible with oral and IV contrast.  Refer back to Dr. Ralene Rice at [facility] surgery.   Nelida Meuse III

## 2021-07-24 NOTE — Patient Instructions (Signed)
If you are age 48 or older, your body mass index should be between 23-30. Your Body mass index is 37.39 kg/m. If this is out of the aforementioned range listed, please consider follow up with your Primary Care Provider.  If you are age 8 or younger, your body mass index should be between 19-25. Your Body mass index is 37.39 kg/m. If this is out of the aformentioned range listed, please consider follow up with your Primary Care Provider.   __________________________________________________________  The [facility] providers would like to encourage you to use [facility] to communicate with providers for non-urgent requests or questions.  Due to long hold times on the telephone, sending your provider a message by [facility] may be a faster and more efficient way to get a response.  Please allow 48 business hours for a response.  Please remember that this is for non-urgent requests.   Your provider has requested that you go to the basement level for lab work before leaving today. Press "B" on the elevator. The lab is located at the first door on the left as you exit the elevator.   You have been scheduled for a CT scan of the abdomen and pelvis at [facility], 1st floor Radiology. You are scheduled on 08-01-2021  at [facility] should arrive 15 minutes prior to your appointment time for registration.  Please pick up 2 bottles of contrast from [facility] at least 3 days prior to your scan. The solution may taste better if refrigerated, but do NOT add ice or any other liquid to this solution. Shake well before drinking.   Please follow the written instructions below on the day of your exam:   1) Do not eat anything after 7am (4 hours prior to your test)   2) Drink 1 bottle of contrast @ 8am (2 hours prior to your exam)  Remember to shake well before drinking and do NOT pour over ice.     Drink 1 bottle of contrast @ 9am (1 hour prior to your exam)   You may take any medications as  prescribed with a small amount of water, if necessary. If you take any of the following medications: METFORMIN, GLUCOPHAGE, GLUCOVANCE, AVANDAMET, RIOMET, FORTAMET, [place] MET, JANUMET, GLUMETZA or METAGLIP, you MAY be asked to HOLD this medication 48 hours AFTER the exam.   The purpose of you drinking the oral contrast is to aid in the visualization of your intestinal tract. The contrast solution may cause some diarrhea. Depending on your individual set of symptoms, you may also receive an intravenous injection of x-ray contrast/dye. Plan on being at [facility] for 45 minutes or longer, depending on the type of exam you are having performed.   If you have any questions regarding your exam or if you need to reschedule, you may call [facility] at [phone] between the hours of 8:00 am and 5:00 pm, Monday-Friday.   Due to recent changes in healthcare laws, you may see the results of your imaging and laboratory studies on MyChart before your provider has had a chance to review them.  We understand that in some cases there may be results that are confusing or concerning to you. Not all laboratory results come back in the same time frame and the provider may be waiting for multiple results in order to interpret others.  Please give [place] 48 hours in order for your provider to thoroughly review all the results before contacting the office for  clarification of your results.    It was a pleasure to see you today!  Thank you for trusting me with your gastrointestinal care!

## 2021-07-25 ENCOUNTER — Telehealth: Payer: Self-pay

## 2021-07-25 NOTE — Telephone Encounter (Signed)
Records faxed to [facility] Dr Rosendo Gros. Will await appointment information

## 2021-08-01 ENCOUNTER — Ambulatory Visit (HOSPITAL_COMMUNITY): Payer: 59

## 2021-08-02 NOTE — Telephone Encounter (Signed)
Patient has been scheduled for 08-17-2021 @ 11am with Dr Rosendo Gros

## 2021-08-12 ENCOUNTER — Other Ambulatory Visit: Payer: Self-pay | Admitting: Podiatry

## 2021-08-12 MED ORDER — ONDANSETRON HCL 4 MG PO TABS: 4.0000 mg | ORAL_TABLET | Freq: Three times a day (TID) | ORAL | 0 refills | Status: DC | PRN

## 2021-08-13 ENCOUNTER — Encounter: Payer: Self-pay | Admitting: Family Medicine

## 2021-08-13 DIAGNOSIS — J449 Chronic obstructive pulmonary disease, unspecified: Secondary | ICD-10-CM

## 2021-08-14 MED ORDER — SPIRIVA RESPIMAT 2.5 MCG/ACT IN AERS: 2.0000 | INHALATION_SPRAY | Freq: Every day | RESPIRATORY_TRACT | 11 refills | Status: DC

## 2021-08-15 ENCOUNTER — Ambulatory Visit (HOSPITAL_COMMUNITY): Payer: 59

## 2021-08-22 ENCOUNTER — Other Ambulatory Visit: Payer: Self-pay | Admitting: Family Medicine

## 2021-08-24 ENCOUNTER — Telehealth: Payer: Self-pay | Admitting: Gastroenterology

## 2021-08-24 NOTE — Telephone Encounter (Signed)
Dr.Cunningham, please advise as DOD this morning (08/24/21).  Danis patient with a history of partial small bowel bowel obstruction over 2 years ago, Generalized abdominal pain, Nausea and vomiting in adult, Abdominal distension, Change in bowel habits, and Early satiety  Spoke with patient, she reports that she has had continued epigastric pain. Pt describes pain as a sharp pain that comes and goes. Pt states that she has been severely nauseated and has had vomiting every morning for the past 2 days. Pt is scheduled for a CT scan on Monday, patient has rescheduled this several times because she started a new job. Advised patient that we really need the imaging to give [place] a difinetive picture of what could possibly be going on.Patient states that she is eating and drinking OK. She does not have anymore Zofran left, she has continued her Omeprazole 20 mg daily. Patient states that she had a bowel movement 2 days ago and a small one this morning.  Patient is worried that she may not be able to drink the contrast, advised that she can refrigerate the contrast and drink it like that but no ice, advised that she may need to take an antiemetic prior to that as well. Please advise, thanks.

## 2021-08-24 NOTE — Telephone Encounter (Signed)
Patient called states she is having a lot of abdominal pain seeking advise on what she can take to help her.

## 2021-08-24 NOTE — Telephone Encounter (Signed)
Lm on vm for patient to return call 

## 2021-08-27 ENCOUNTER — Other Ambulatory Visit: Payer: Self-pay

## 2021-08-27 ENCOUNTER — Ambulatory Visit (HOSPITAL_COMMUNITY)
Admission: RE | Admit: 2021-08-27 | Discharge: 2021-08-27 | Disposition: A | Discharge status: Home / Self Care | Payer: 59 | Source: Ambulatory Visit | Attending: Gastroenterology | Admitting: Gastroenterology

## 2021-08-27 ENCOUNTER — Encounter (HOSPITAL_COMMUNITY): Payer: Self-pay

## 2021-08-27 DIAGNOSIS — R140 Abdominal distension (gaseous): Secondary | ICD-10-CM | POA: Insufficient documentation

## 2021-08-27 DIAGNOSIS — R1084 Generalized abdominal pain: Secondary | ICD-10-CM | POA: Diagnosis present

## 2021-08-27 DIAGNOSIS — R194 Change in bowel habit: Secondary | ICD-10-CM | POA: Diagnosis present

## 2021-08-27 DIAGNOSIS — R112 Nausea with vomiting, unspecified: Secondary | ICD-10-CM | POA: Insufficient documentation

## 2021-08-27 LAB — POCT I-STAT CREATININE: Creatinine, Ser: 0.7 mg/dL (ref 0.44–1.00)

## 2021-08-27 MED ORDER — IOHEXOL 350 MG/ML SOLN
75.0000 mL | Freq: Once | INTRAVENOUS | Status: AC | PRN
  Administered 2021-08-27: 75 mL via INTRAVENOUS

## 2021-08-27 MED ORDER — ONDANSETRON HCL 4 MG PO TABS: 4.0000 mg | ORAL_TABLET | ORAL | 0 refills | Status: DC | PRN

## 2021-08-27 NOTE — Telephone Encounter (Signed)
Spoke with patient in regards to recommendations. Pt requested that RX be sent to [facility] on [address]. Pt reports that she was able to complete CT scan today. Advised that once we have the results and the provider has reviewed it we will call her with that information and recommendations. Pt verbalized understanding and had no concerns at the end of the call.

## 2021-08-29 ENCOUNTER — Ambulatory Visit: Payer: Self-pay | Admitting: General Surgery

## 2021-08-29 NOTE — H&P (Signed)
Subjective   Chief Complaint: new problem       History of Present Illness: Mariah Rice is a 50 y.o. female who is seen today as an office consultation at the request of Dr. Loletha Carrow for evaluation of new problem .     Patient is a 50 year old female who comes in secondary to abdominal pain.  Patient previously had been seen on several occasions.  Previously she had been worked up for gallbladder issues, she has had continued abdominal pain nausea vomiting.  Patient did patient undergo CT scan which revealed some proximal jejunal thickening.  Patient recently had a CT scan which did reveal intussusception in the same area.  Patient previously had an endoscopy, push endoscopy into the small bowel was normal cannot visualize area of concern.   I did review the CT scan that was recently done personally I discussed this with the patient.   Patient symptoms of continued, with nausea, vomiting, diarrhea, pain.  The pain is usually over the epigastrium.  It does not radiate.     Review of Systems: A complete review of systems was obtained from the patient.  I have reviewed this information and discussed as appropriate with the patient.  See HPI as well for other ROS.   Review of Systems  Constitutional: Negative for fever.  HENT: Negative for congestion.   Eyes: Negative for blurred vision.  Respiratory: Negative for cough, shortness of breath and wheezing.   Cardiovascular: Negative for chest pain and palpitations.  Gastrointestinal: Positive for abdominal pain, diarrhea, nausea and vomiting. Negative for heartburn.  Genitourinary: Negative for dysuria.  Musculoskeletal: Negative for myalgias.  Skin: Negative for rash.  Neurological: Negative for dizziness and headaches.  Psychiatric/Behavioral: Negative for depression and suicidal ideas.  All other systems reviewed and are negative.       Medical History: Past Medical History Past Medical History: Diagnosis Date  Anxiety    COPD  (chronic obstructive pulmonary disease) (CMS-HCC)    GERD (gastroesophageal reflux disease)        There is no problem list on file for this patient.     Past Surgical History Past Surgical History: Procedure Laterality Date  foot surgery N/A    HYSTERECTOMY          Allergies No Known Allergies    Current Outpatient Medications on File Prior to Visit Medication Sig Dispense Refill  estradioL (ESTRACE) 2 MG tablet Take 2 mg by mouth once daily      methocarbamoL (ROBAXIN) 750 MG tablet Take 750 mg by mouth 4 (four) times daily      ondansetron (ZOFRAN) 4 MG tablet TAKE 1 TABLET BY MOUTH EVERY 4 HOURS AS NEEDED FOR NAUSEA OR VOMITING      OXcarbazepine (TRILEPTAL) 600 MG tablet Take 600 mg by mouth 2 (two) times daily      pravastatin (PRAVACHOL) 40 MG tablet Take 40 mg by mouth once daily      propranoloL (INDERAL LA) 60 MG LA capsule Take 60 mg by mouth once daily      SPIRIVA RESPIMAT 2.5 mcg/actuation inhalation spray INHALE 2 PUFFS BY MOUTH INTO THE LUNGS DAILY       No current facility-administered medications on file prior to visit.     Family History Family History Problem Relation Age of Onset  High blood pressure (Hypertension) Father    Heart valve disease Father    Diabetes Father        Social History   Tobacco Use Smoking Status  Former Smoker Smokeless Tobacco Never Used     Social History Social History    Socioeconomic History  Marital status: Single Tobacco Use  Smoking status: Former Smoker  Smokeless tobacco: Never Used [employer], mining Use: Never used Substance and Sexual Activity  Alcohol use: Never  Drug use: Never      Objective:     Vitals:   08/29/21 1537 BP: (!) 142/72 Pulse: 75 Temp: 36.5 C (97.7 F) SpO2: 100% Weight: 98.5 kg (217 lb 3.2 oz) Height: 162.6 cm ('5\' 4"'$ )   Body mass index is 37.28 kg/m.   Physical Exam Constitutional:      Appearance: Normal appearance.  HENT:     Head: Normocephalic and  atraumatic.     Mouth/Throat:     Mouth: Mucous membranes are moist.     Pharynx: Oropharynx is clear.  Eyes:     General: No scleral icterus.    Pupils: Pupils are equal, round, and reactive to light.  Cardiovascular:     Rate and Rhythm: Normal rate and regular rhythm.     Pulses: Normal pulses.     Heart sounds: No murmur heard.   No friction rub. No gallop.  Pulmonary:     Effort: Pulmonary effort is normal. No respiratory distress.     Breath sounds: Normal breath sounds. No stridor.  Abdominal:     General: Abdomen is flat.     Tenderness: There is abdominal tenderness (epigastriv).  Musculoskeletal:        General: No swelling.  Skin:    General: Skin is warm.  Neurological:     General: No focal deficit present.     Mental Status: She is alert and oriented to person, place, and time. Mental status is at baseline.  Psychiatric:        Mood and Affect: Mood normal.        Thought Content: Thought content normal.        Judgment: Judgment normal.            Assessment and Plan: Diagnoses and all orders for this visit:   Intussusception of small bowel (CMS-HCC)       Patient is a 50 year old female with a history of nausea vomiting, intussusception on most recent CT scan. Patient was previously scheduled for diagnostic laparoscopy, possible small bowel resection, possible adhesiolysis but lost her insurance.   At this time secondary to her most recent UTI, continued complaints I would recommend diagnostic laparoscopy.  Discussed with her this may require small bowel resection and anastomosis, possible laparotomy   I discussed the risk and benefits of the procedure to include but not limited to: Infection, bleeding, damage to surrounding structures, possible need for further surgery.  Patient voiced understanding and wishes to proceed.   No follow-ups on file.   Ralene Ok, MD

## 2021-09-19 NOTE — Patient Instructions (Signed)
It was great to see you! Thank you for allowing me to participate in your care!  I recommend that you always bring your medications to each appointment as this makes it easy to ensure we are on the correct medications and helps [place] not miss when refills are needed.  Our plans for today:  -We gave you refills today. -For your constipation I would like for you to start MiraLAX.  You can start this with 17 g which is 1 capful each day.  You can increase this to 2 capfuls if you are not having a soft bowel movement each day.  If you still need you can start doing 1 or 2 capfuls in the morning and 1 or 2 additional capfuls in the evening mixed in fluid.  Make sure and drink plenty of water with this.  If you start to have diarrhea please decrease your dose. We also provided vaccinations today.  Take care and seek immediate care sooner if you develop any concerns.   Dr. Lurline Del, [place]

## 2021-09-19 NOTE — Progress Notes (Signed)
    SUBJECTIVE:   CHIEF COMPLAINT / HPI:   Checkup/medication refills: 50 year old female presenting for the above.  She states is here for refills today.  She has upcoming surgery on 10/21.  She has no complaints today.  Constipation: She states that she sometimes has 1 bowel movement per week.  Her last bowel movement was yesterday.  She states that she often has to strain on the toilet for a long period of time in order to have a bowel movement.  PERTINENT  PMH / PSH: History of abdominal pain  OBJECTIVE:   BP 138/84   Pulse 65   Ht 5\' 4"  (1.626 m)   Wt 218 lb 12.8 oz (99.2 kg)   LMP  (LMP Unknown) Comment: on Megace  SpO2 98%   BMI 37.56 kg/m    General: NAD, pleasant, able to participate in exam Cardiac: RRR, no murmurs. Respiratory: CTAB, normal effort, No wheezes, rales or rhonchi Abdomen: Bowel sounds present, nontender, nondistended. Skin: warm and dry, no rashes noted Neuro: alert, no obvious focal deficits Psych: Normal affect and mood  ASSESSMENT/PLAN:   Constipation: Patient with a history of constipation, often has "1 bowel movement per week".  She sometimes has to sit on the toilet up to 45 minutes in order to pass stool.  She has not tried MiraLAX yet.  She had her last bowel movement yesterday.  Bowel sounds present, no concern for small bowel obstruction.  She has upcoming surgery to remove adhesions due to ongoing abdominal pain which she is not experiencing today.  Will start MiraLAX 17 g daily.  Discussed with her about how to titrate this for soft bowel movement each day.  Refills: Refills provided.  Flu shot given today.  Lurline Del, [place]

## 2021-09-21 ENCOUNTER — Ambulatory Visit (INDEPENDENT_AMBULATORY_CARE_PROVIDER_SITE_OTHER): Payer: 59 | Admitting: Family Medicine

## 2021-09-21 ENCOUNTER — Encounter: Payer: Self-pay | Admitting: Family Medicine

## 2021-09-21 ENCOUNTER — Ambulatory Visit: Payer: 59 | Admitting: Family Medicine

## 2021-09-21 ENCOUNTER — Other Ambulatory Visit: Payer: Self-pay

## 2021-09-21 VITALS — BP 138/84 | HR 65 | Ht 64.0 in | Wt 218.8 lb

## 2021-09-21 DIAGNOSIS — K5900 Constipation, unspecified: Secondary | ICD-10-CM | POA: Diagnosis not present

## 2021-09-21 DIAGNOSIS — Z23 Encounter for immunization: Secondary | ICD-10-CM

## 2021-09-21 DIAGNOSIS — G43001 Migraine without aura, not intractable, with status migrainosus: Secondary | ICD-10-CM

## 2021-09-21 DIAGNOSIS — K219 Gastro-esophageal reflux disease without esophagitis: Secondary | ICD-10-CM

## 2021-09-21 MED ORDER — OMEPRAZOLE 20 MG PO CPDR: 20.0000 mg | DELAYED_RELEASE_CAPSULE | Freq: Every day | ORAL | 3 refills | Status: DC

## 2021-09-21 MED ORDER — PROPRANOLOL HCL ER 60 MG PO CP24: 60.0000 mg | ORAL_CAPSULE | Freq: Every day | ORAL | 3 refills | Status: DC

## 2021-09-21 MED ORDER — POLYETHYLENE GLYCOL 3350 17 GM/SCOOP PO POWD: 17.0000 g | Freq: Every day | ORAL | 1 refills | Status: DC

## 2021-09-21 MED ORDER — ONDANSETRON HCL 4 MG PO TABS: 4.0000 mg | ORAL_TABLET | ORAL | 0 refills | Status: DC | PRN

## 2021-09-21 MED ORDER — EPINEPHRINE 0.3 MG/0.3ML IJ SOAJ: 0.3000 mg | INTRAMUSCULAR | 0 refills | Status: DC | PRN

## 2021-09-26 NOTE — Pre-Procedure Instructions (Signed)
Surgical Instructions    Your procedure is scheduled on Friday, October 21st.  Report to [facility] Main Entrance "A" at 10:30 A.M., then check in with the Admitting office.  Call this number if you have problems the morning of surgery:  [phone]   If you have any questions prior to your surgery date call [phone]: Open Monday-Friday 8am-4pm    Remember:  Do not eat after midnight the night before your surgery  You may drink clear liquids until 9:30 a.m. the morning of your surgery.   Clear liquids allowed are: Water, Non-Citrus Juices (without pulp), Carbonated Beverages, Clear Tea, Black Coffee ONLY (NO MILK, CREAM OR POWDERED CREAMER of any kind), and Gatorade.    Take these medicines the morning of surgery with A SIP OF WATER   cetirizine (ZYRTEC)  methocarbamol (ROBAXIN)  omeprazole (PRILOSEC)  oxcarbazepine (TRILEPTAL)  pravastatin (PRAVACHOL) propranolol ER (INDERAL LA)  SPIRIVA RESPIMAT-please bring your inhaler to the hospital.  As needed ondansetron [facility])  EPINEPHrine-please let a nurse know if you had to use this.   As of today, STOP taking any Aspirin (unless otherwise instructed by your surgeon) Aleve, Naproxen, Ibuprofen, Motrin, Advil, Goody's, BC's, all herbal medications, fish oil, and all vitamins.     After your COVID test   You are not required to quarantine however you are required to wear a well-fitting mask when you are out and around people not in your household.  If your mask becomes wet or soiled, replace with a new one.  Wash your hands often with soap and water for 20 seconds or clean your hands with an alcohol-based hand sanitizer that contains at least 60% alcohol.  Do not share personal items.  Notify your provider: if you are in close contact with someone who has COVID  or if you develop a fever of 100.4 or greater, sneezing, cough, sore throat, shortness of breath or body aches.             Do not wear jewelry or  makeup Do not wear lotions, powders, perfumes, or deodorant. Do not shave 48 hours prior to surgery.   Do not bring valuables to the hospital. Do Not wear nail polish, gel polish, artificial nails, or any other type of covering on natural nails including finger and toenails. If patients have artificial nails, gel coating, etc. that need to be removed by a nail salon, please have this removed prior to surgery or surgery may need to be canceled/delayed if the surgeon/ anesthesia feels like the patient is unable to be adequately monitored.             [facility] is not responsible for any belongings or valuables.  Do NOT Smoke (Tobacco/Vaping)  24 hours prior to your procedure  If you use a CPAP at night, you may bring your mask for your overnight stay.   Contacts, glasses, hearing aids, dentures or partials may not be worn into surgery, please bring cases for these belongings   For patients admitted to the hospital, discharge time will be determined by your treatment team.   Patients discharged the day of surgery will not be allowed to drive home, and someone needs to stay with them for 24 hours.  NO VISITORS WILL BE ALLOWED IN PRE-OP WHERE PATIENTS ARE PREPPED FOR SURGERY.  ONLY 1 SUPPORT PERSON MAY BE PRESENT IN THE WAITING ROOM WHILE YOU ARE IN SURGERY.  IF YOU ARE TO BE ADMITTED, ONCE YOU ARE IN YOUR ROOM YOU WILL  BE ALLOWED TWO (2) VISITORS. 1 (ONE) VISITOR MAY STAY OVERNIGHT BUT MUST ARRIVE TO THE ROOM BY 8pm.  Minor children may have two parents present. Special consideration for safety and communication needs will be reviewed on a case by case basis.  Special instructions:    Oral Hygiene is also important to reduce your risk of infection.  Remember - BRUSH YOUR TEETH THE MORNING OF SURGERY WITH YOUR REGULAR TOOTHPASTE   [place]- Preparing For Surgery  Before surgery, you can play an important role. Because skin is not sterile, your skin needs to be as free of germs as  possible. You can reduce the number of germs on your skin by washing with CHG (chlorahexidine gluconate) Soap before surgery.  CHG is an antiseptic cleaner which kills germs and bonds with the skin to continue killing germs even after washing.     Please do not use if you have an allergy to CHG or antibacterial soaps. If your skin becomes reddened/irritated stop using the CHG.  Do not shave (including legs and underarms) for at least 48 hours prior to first CHG shower. It is OK to shave your face.  Please follow these instructions carefully.     Shower the NIGHT BEFORE SURGERY and the MORNING OF SURGERY with CHG Soap.   If you chose to wash your hair, wash your hair first as usual with your normal shampoo. After you shampoo, rinse your hair and body thoroughly to remove the shampoo.  Then [employer] and genitals (private parts) with your normal soap and rinse thoroughly to remove soap.  After that Use CHG Soap as you would any other liquid soap. You can apply CHG directly to the skin and wash gently with a scrungie or a clean washcloth.   Apply the CHG Soap to your body ONLY FROM THE NECK DOWN.  Do not use on open wounds or open sores. Avoid contact with your eyes, ears, mouth and genitals (private parts). Wash Face and genitals (private parts)  with your normal soap.   Wash thoroughly, paying special attention to the area where your surgery will be performed.  Thoroughly rinse your body with warm water from the neck down.  DO NOT shower/wash with your normal soap after using and rinsing off the CHG Soap.  Pat yourself dry with a CLEAN TOWEL.  Wear CLEAN PAJAMAS to bed the night before surgery  Place CLEAN SHEETS on your bed the night before your surgery  DO NOT SLEEP WITH PETS.   Day of Surgery:  Take a shower with CHG soap. Wear Clean/Comfortable clothing the morning of surgery Do not apply any deodorants/lotions.   Remember to brush your teeth WITH YOUR REGULAR TOOTHPASTE.    Please read over the following fact sheets that you were given.

## 2021-09-27 ENCOUNTER — Other Ambulatory Visit: Payer: Self-pay | Admitting: Family Medicine

## 2021-09-27 ENCOUNTER — Inpatient Hospital Stay (HOSPITAL_COMMUNITY)
Admission: RE | Admit: 2021-09-27 | Discharge: 2021-09-27 | Disposition: A | Discharge status: Home / Self Care | Payer: 59 | Source: Ambulatory Visit

## 2021-09-27 NOTE — Pre-Procedure Instructions (Signed)
Surgical Instructions    Your procedure is scheduled on Friday, October 21st.  Report to [facility] Main Entrance "A" at 10:30 A.M., then check in with the Admitting office.  Call this number if you have problems the morning of surgery:  [phone]   If you have any questions prior to your surgery date call [phone]: Open Monday-Friday 8am-4pm    Remember:  Do not eat after midnight the night before your surgery  You may drink clear liquids until 9:30 a.m. the morning of your surgery.   Clear liquids allowed are: Water, Non-Citrus Juices (without pulp), Carbonated Beverages, Clear Tea, Black Coffee ONLY (NO MILK, CREAM OR POWDERED CREAMER of any kind), and Gatorade.    Take these medicines the morning of surgery with A SIP OF WATER   cetirizine (ZYRTEC)  methocarbamol (ROBAXIN)  omeprazole (PRILOSEC)  oxcarbazepine (TRILEPTAL)  pravastatin (PRAVACHOL) propranolol ER (INDERAL LA)  SPIRIVA RESPIMAT-please bring your inhaler to the hospital.  As needed ondansetron [facility])  EPINEPHrine-please let a nurse know if you had to use this.   As of today, STOP taking any Aspirin (unless otherwise instructed by your surgeon) Aleve, Naproxen, Ibuprofen, Motrin, Advil, Goody's, BC's, all herbal medications, fish oil, and all vitamins.   DAY OF SURGERY;        Do not wear jewelry, makeup, or nail polish Do not wear lotions, powders, perfumes, or deodorant. Do not shave 48 hours prior to surgery.   Do not bring valuables to the hospital.             [facility] is not responsible for any belongings or valuables.  Do NOT Smoke (Tobacco/Vaping)  24 hours prior to your procedure  If you use a CPAP at night, you may bring your mask for your overnight stay.   Contacts, glasses, hearing aids, dentures or partials may not be worn into surgery, please bring cases for these belongings   For patients admitted to the hospital, discharge time will be determined by your treatment team.    Patients discharged the day of surgery will not be allowed to drive home, and someone needs to stay with them for 24 hours.  NO VISITORS WILL BE ALLOWED IN PRE-OP WHERE PATIENTS ARE PREPPED FOR SURGERY.  ONLY 1 SUPPORT PERSON MAY BE PRESENT IN THE WAITING ROOM WHILE YOU ARE IN SURGERY.  IF YOU ARE TO BE ADMITTED, ONCE YOU ARE IN YOUR ROOM YOU WILL BE ALLOWED TWO (2) VISITORS. 1 (ONE) VISITOR MAY STAY OVERNIGHT BUT MUST ARRIVE TO THE ROOM BY 8pm.  Minor children may have two parents present. Special consideration for safety and communication needs will be reviewed on a case by case basis.  Special instructions:    Oral Hygiene is also important to reduce your risk of infection.  Remember - BRUSH YOUR TEETH THE MORNING OF SURGERY WITH YOUR REGULAR TOOTHPASTE   [place]- Preparing For Surgery  Before surgery, you can play an important role. Because skin is not sterile, your skin needs to be as free of germs as possible. You can reduce the number of germs on your skin by washing with CHG (chlorahexidine gluconate) Soap before surgery.  CHG is an antiseptic cleaner which kills germs and bonds with the skin to continue killing germs even after washing.     Please do not use if you have an allergy to CHG or antibacterial soaps. If your skin becomes reddened/irritated stop using the CHG.  Do not shave (including legs and underarms) for at  least 48 hours prior to first CHG shower. It is OK to shave your face.  Please follow these instructions carefully.     Shower the NIGHT BEFORE SURGERY and the MORNING OF SURGERY with CHG Soap.   If you chose to wash your hair, wash your hair first as usual with your normal shampoo. After you shampoo, rinse your hair and body thoroughly to remove the shampoo.  Then [employer] and genitals (private parts) with your normal soap and rinse thoroughly to remove soap.  After that Use CHG Soap as you would any other liquid soap. You can apply CHG directly to the skin  and wash gently with a scrungie or a clean washcloth.   Apply the CHG Soap to your body ONLY FROM THE NECK DOWN.  Do not use on open wounds or open sores. Avoid contact with your eyes, ears, mouth and genitals (private parts). Wash Face and genitals (private parts)  with your normal soap.   Wash thoroughly, paying special attention to the area where your surgery will be performed.  Thoroughly rinse your body with warm water from the neck down.  DO NOT shower/wash with your normal soap after using and rinsing off the CHG Soap.  Pat yourself dry with a CLEAN TOWEL.  Wear CLEAN PAJAMAS to bed the night before surgery  Place CLEAN SHEETS on your bed the night before your surgery  DO NOT SLEEP WITH PETS.   Day of Surgery:  Take a shower with CHG soap. Wear Clean/Comfortable clothing the morning of surgery Do not apply any deodorants/lotions.   Remember to brush your teeth WITH YOUR REGULAR TOOTHPASTE.   Please read over the following fact sheets that you were given.

## 2021-10-04 ENCOUNTER — Other Ambulatory Visit: Payer: Self-pay | Admitting: Family Medicine

## 2021-10-05 ENCOUNTER — Inpatient Hospital Stay: Admit: 2021-10-05 | Payer: 59 | Admitting: General Surgery

## 2021-10-05 ENCOUNTER — Ambulatory Visit (INDEPENDENT_AMBULATORY_CARE_PROVIDER_SITE_OTHER): Payer: 59 | Admitting: Surgery

## 2021-10-05 ENCOUNTER — Other Ambulatory Visit: Payer: Self-pay

## 2021-10-05 ENCOUNTER — Encounter: Payer: Self-pay | Admitting: Surgery

## 2021-10-05 VITALS — BP 155/104 | HR 67 | Temp 98.0°F | Ht 64.0 in | Wt 214.8 lb

## 2021-10-05 DIAGNOSIS — K561 Intussusception: Secondary | ICD-10-CM | POA: Diagnosis not present

## 2021-10-05 SURGERY — LAPAROSCOPY, DIAGNOSTIC
Anesthesia: General

## 2021-10-05 NOTE — Progress Notes (Signed)
Request for Medical Clearance has been faxed to Dr Lurline Del.

## 2021-10-05 NOTE — Progress Notes (Signed)
10/05/2021  Reason for Visit: Small bowel intussusception  Requesting Provider: Ralene Ok, MD  History of Present Illness: Mariah Rice is a 50 y.o. female presenting for evaluation of small bowel intussusception.  Patient reports that she has been dealing with abdominal pain for many years and has had extensive work-up including imaging studies as well as endoscopy studies.  Her CT scans in the past have shown thickening of the wall of the proximal jejunum concerning for enteritis as seen on CT scan in 2015 and 2020.  Most recently, she had a CT scan on 08/27/2021 which showed 2 areas of small bowel intussusception in the proximal jejunum with associated wall thickening.  There is no evidence of bowel obstruction at that point.  She has had endoscopies and a push enteroscopy in that area did not show any abnormalities in the wall of the jejunum.  She was initially scheduled to undergo a diagnostic laparoscopy with possible small bowel resection or lysis of adhesions with Dr. Rosendo Gros on 10/05/2021.  However she recently changed insurance company and currently he is out of network.  She now presents today for discussion of the same symptoms and potential surgical management.  She reports that she is very tired of the ongoing pain and wants to feel better.  Reports that she has a lot of issues with nausea and she is only able to tolerate p.o. diet with Zofran on board.  She is able to take liquids without issues although sometimes they can also cause some nausea.  Also is having issues with constipation and MiraLAX or other laxatives do not help as much.  She describes having pain mostly in the upper epigastric area but also sometimes in the lower abdomen.  Denies any fevers, chills, chest pain, shortness of breath.  Of note, the patient has had an abdominal hysterectomy with bilateral salpingo-oophorectomy in 2016 for uterine bleeding.  Past Medical History: Past Medical History:  Diagnosis Date    Abnormal finding on GI tract imaging    Anaphylaxis 07/26/2020   Anxiety    COPD (chronic obstructive pulmonary disease) (HCC)    COPD exacerbation ([place]) 07/17/2020   Dental abscess    Depression    Family history of breast cancer    Family history of breast cancer 01/26/2019   Family history of ovarian cancer    Genetic testing 03/08/2019   Negative genetic testing on the CancerNext-Expanded+RNAinsight testing.  The CancerNext-Expanded gene panel offered by [facility] and includes sequencing and rearrangement analysis for the following 67 genes: AIP, ALK, APC*, ATM*, BAP1, BARD1, BLM, BMPR1A, BRCA1*, BRCA2*, BRIP1*, CDH1*, CDK4, CDKN1B, CDKN2A, CHEK2*, DICER1, FANCC, FH, FLCN, GALNT12, HOXB13, MAX, MEN1, MET, MLH1*, MRE11A, MSH2*   GERD (gastroesophageal reflux disease)    Headache    migraines   Hypertension    Hyperthyroidism 1990s   PONV (postoperative nausea and vomiting)    Shortness of breath dyspnea    with anxiety   Small bowel obstruction (HCC)    Tick bite of abdomen 05/13/2017     Past Surgical History: Past Surgical History:  Procedure Laterality Date   ENTEROSCOPY N/A 05/12/2019   Procedure: ENTEROSCOPY;  Surgeon: Doran Stabler, MD;  Location: [facility];  Service: Gastroenterology;  Laterality: N/A;   FOOT SURGERY Right    INCISE AND DRAIN ABCESS  2005   LAPAROSCOPIC ASSISTED VAGINAL HYSTERECTOMY N/A 07/11/2015   Procedure: LAPAROSCOPIC ASSISTED VAGINAL Total HYSTERECTOMY;  Surgeon: Osborne Oman, MD;  Location: [facility];  Service: Gynecology;  Laterality: N/A;   LAPAROSCOPIC BILATERAL SALPINGECTOMY Bilateral 07/11/2015   Procedure: LAPAROSCOPIC BILATERAL SALPINGO OOPHORECTOMY ;  Surgeon: Osborne Oman, MD;  Location: [facility];  Service: Gynecology;  Laterality: Bilateral;   tubal ligation  2000   WISDOM TOOTH EXTRACTION      Home Medications: Prior to Admission medications   Medication Sig Start Date End Date Taking? Authorizing Provider  cetirizine  (ZYRTEC) 10 MG tablet Take 1 tablet (10 mg total) by mouth daily. 08/11/20  Yes Wilber Oliphant, MD  EPINEPHrine 0.3 mg/0.3 mL IJ SOAJ injection Inject 0.3 mg into the muscle as needed for anaphylaxis (for difficulty breathing, throat, tongue or lip swelling. must come to ED after use.). 09/21/21  Yes Welborn, Ryan, DO  estradiol (ESTRACE) 2 MG tablet Take 1 tablet (2 mg total) by mouth daily. 03/05/21  Yes Benay Pike, MD  fluticasone [facility]) 50 MCG/ACT nasal spray USE 1 SPRAY IN EACH NOSTRIL ONCE DAILY FOR 14 DAYS 10/04/21  Yes Lurline Del, DO  ibuprofen (ADVIL) 800 MG tablet Take 800 mg by mouth 3 (three) times daily. 11/16/20  Yes [provider]  methocarbamol (ROBAXIN) 750 MG tablet TAKE 1 TABLET BY MOUTH FOUR TIMES A DAY 09/27/21  Yes Welborn, Ryan, DO  omeprazole (PRILOSEC) 20 MG capsule Take 1 capsule (20 mg total) by mouth daily. 09/21/21  Yes Welborn, Ryan, DO  ondansetron (ZOFRAN) 4 MG tablet Take 1 tablet (4 mg total) by mouth every 4 (four) hours as needed for nausea or vomiting. 09/21/21  Yes Welborn, Ryan, DO  oxcarbazepine (TRILEPTAL) 600 MG tablet Take 1 tablet (600 mg total) by mouth 2 (two) times daily. 01/19/21  Yes Milus Banister C, DO  polyethylene glycol powder (GLYCOLAX/MIRALAX) 17 GM/SCOOP powder Take 17 g by mouth daily. 09/21/21  Yes Welborn, Ryan, DO  pravastatin (PRAVACHOL) 40 MG tablet Take 1 tablet (40 mg total) by mouth daily. 02/22/21  Yes Darrelyn Hillock N, DO  propranolol ER (INDERAL LA) 60 MG 24 hr capsule Take 1 capsule (60 mg total) by mouth daily. 09/21/21  Yes Welborn, Ryan, DO  SPIRIVA RESPIMAT 2.5 MCG/ACT AERS Inhale 2 puffs into the lungs daily. 08/14/21  Yes Lattie Haw, MD    Allergies: Allergies  Allergen Reactions   Azithromycin Anaphylaxis   Meloxicam Nausea And Vomiting   Sumatriptan Nausea Only, Rash and Other (See Comments)    Throat felt like it was closing up   Penicillins Nausea Only and Rash    Has patient had a PCN reaction  causing immediate rash, facial/tongue/throat swelling, SOB or lightheadedness with hypotension: no Has patient had a PCN reaction causing severe rash involving mucus membranes or skin necrosis:unknown Has patient had a PCN reaction that required hospitalization : yes Has patient had a PCN reaction occurring within the last 10 years: no If all of the above answers are "NO", then may proceed with Cephalosporin use.    Sulfa Antibiotics Nausea Only and Rash    Social History:  reports that she has been smoking cigarettes. She started smoking about 34 years ago. She has a 15.00 pack-year smoking history. She has never used smokeless tobacco. She reports that she does not currently use alcohol. She reports that she does not use drugs.   Family History: Family History  Problem Relation Age of Onset   Ovarian cancer Mother 2       d. 19   Heart disease Father    Hypertension Father    COPD Father    Hypertension  Paternal Aunt 34   Lung cancer Paternal Aunt 14   Breast cancer Paternal Aunt 69   Thyroid disease Maternal Grandmother    Lymphoma Maternal Grandmother 62       NHL, recurrance at 60   Lung cancer Maternal Grandmother 61   Hypertension Paternal Grandmother    Heart disease Paternal Grandmother    Heart Problems Paternal Grandmother    Dementia Paternal Grandmother    Colon cancer Neg Hx    Esophageal cancer Neg Hx    Stomach cancer Neg Hx    Pancreatic cancer Neg Hx     Review of Systems: Review of Systems  Constitutional:  Negative for chills and fever.  HENT:  Negative for hearing loss.   Respiratory:  Negative for shortness of breath.   Cardiovascular:  Negative for chest pain.  Gastrointestinal:  Positive for abdominal pain, constipation, nausea and vomiting. Negative for diarrhea.  Genitourinary:  Negative for dysuria.  Musculoskeletal:  Negative for myalgias.  Skin:  Negative for rash.  Neurological:  Negative for dizziness.  Psychiatric/Behavioral:  Negative  for depression.    Physical Exam BP (!) 155/104   Pulse 67   Temp 98 F (36.7 C) (Oral)   Ht _0  (1.626 m)   Wt 214 lb 12.8 oz (97.4 kg)   LMP  (LMP Unknown) Comment: on Megace  SpO2 97%   BMI 36.87 kg/m  CONSTITUTIONAL: No acute distress HEENT:  Normocephalic, atraumatic, extraocular motion intact. NECK: Trachea is midline, and there is no jugular venous distension.  RESPIRATORY:  Lungs are clear, and breath sounds are equal bilaterally. Normal respiratory effort without pathologic use of accessory muscles. CARDIOVASCULAR: Heart is regular without murmurs, gallops, or rubs. GI: The abdomen is soft, obese, nondistended, currently with discomfort to palpation in the lower epigastric region above the umbilicus.  No peritoneal signs.  Prior laparoscopic incisions are well-healed. MUSCULOSKELETAL:  Normal muscle strength and tone in all four extremities.  No peripheral edema or cyanosis. SKIN: Skin turgor is normal. There are no pathologic skin lesions.  NEUROLOGIC:  Motor and sensation is grossly normal.  Cranial nerves are grossly intact. PSYCH:  Alert and oriented to person, place and time. Affect is normal.  Laboratory Analysis: Labs from 07/24/2021: Sodium 125, potassium 3.8, chloride 95, CO2 23, BUN 8, creatinine 0.73.  Total bilirubin 0.3, AST 13, ALT 16, alkaline phosphatase 83.  WBC 8.0, hemoglobin 14.4, hematocrit 42.3, platelets 270  Imaging: CT scan of abdomen and pelvis on 08/27/2021: IMPRESSION: 2 adjacent small bowel intussusceptions in the proximal jejunum. Abnormal jejunal wall thickening in this region could be due to enteritis, although a lead mass cannot be excluded. Consider further evaluation with capsule endoscopy.   No other sites of bowel disease identified. No evidence of bowel obstruction.  Assessment and Plan: This is a 50 y.o. female with longstanding history of abdominal pain with findings showing worsening proximal jejunal wall thickening and now  progressing to intussusception.  - Reviewed with the patient the findings on her imaging studies showing out intussusception of tube areas of the proximal jejunum.  Discussed with the patient that given her progressing and continuing pain, I think it is reasonable to proceed with surgical intervention.  I would agree with the plan for Dr. Rosendo Gros for diagnostic laparoscopy with possible small bowel resection and possible lysis of adhesions.  Discussed with her that we could potentially do this with a minimally invasive robotic approach however there is a possibility that we may have to do  open surgery.  Reviewed with her the surgery at length particularly risks of bleeding, infection, injury to surrounding structures, need for open surgery, hospital stay, postop recovery, and she is willing to proceed. - We will schedule patient for robotic assisted diagnostic laparoscopy, possible small bowel resection, possible lysis of adhesions on 10/16/2021.  We will obtain medical clearance from her PCP.  I spent 80 minutes dedicated to the care of this patient on the date of this encounter to include pre-visit review of records, face-to-face time with the patient discussing diagnosis and management, and any post-visit coordination of care.   Melvyn Neth, [facility]

## 2021-10-05 NOTE — H&P (View-Only) (Signed)
10/05/2021  Reason for Visit: Small bowel intussusception  Requesting Provider: Ralene Ok, MD  History of Present Illness: Mariah Rice is a 50 y.o. female presenting for evaluation of small bowel intussusception.  Patient reports that she has been dealing with abdominal pain for many years and has had extensive work-up including imaging studies as well as endoscopy studies.  Her CT scans in the past have shown thickening of the wall of the proximal jejunum concerning for enteritis as seen on CT scan in 2015 and 2020.  Most recently, she had a CT scan on 08/27/2021 which showed 2 areas of small bowel intussusception in the proximal jejunum with associated wall thickening.  There is no evidence of bowel obstruction at that point.  She has had endoscopies and a push enteroscopy in that area did not show any abnormalities in the wall of the jejunum.  She was initially scheduled to undergo a diagnostic laparoscopy with possible small bowel resection or lysis of adhesions with Dr. Rosendo Gros on 10/05/2021.  However she recently changed insurance company and currently he is out of network.  She now presents today for discussion of the same symptoms and potential surgical management.  She reports that she is very tired of the ongoing pain and wants to feel better.  Reports that she has a lot of issues with nausea and she is only able to tolerate p.o. diet with Zofran on board.  She is able to take liquids without issues although sometimes they can also cause some nausea.  Also is having issues with constipation and MiraLAX or other laxatives do not help as much.  She describes having pain mostly in the upper epigastric area but also sometimes in the lower abdomen.  Denies any fevers, chills, chest pain, shortness of breath.  Of note, the patient has had an abdominal hysterectomy with bilateral salpingo-oophorectomy in 2016 for uterine bleeding.  Past Medical History: Past Medical History:  Diagnosis Date    Abnormal finding on GI tract imaging    Anaphylaxis 07/26/2020   Anxiety    COPD (chronic obstructive pulmonary disease) (HCC)    COPD exacerbation ([place]) 07/17/2020   Dental abscess    Depression    Family history of breast cancer    Family history of breast cancer 01/26/2019   Family history of ovarian cancer    Genetic testing 03/08/2019   Negative genetic testing on the CancerNext-Expanded+RNAinsight testing.  The CancerNext-Expanded gene panel offered by [facility] and includes sequencing and rearrangement analysis for the following 67 genes: AIP, ALK, APC*, ATM*, BAP1, BARD1, BLM, BMPR1A, BRCA1*, BRCA2*, BRIP1*, CDH1*, CDK4, CDKN1B, CDKN2A, CHEK2*, DICER1, FANCC, FH, FLCN, GALNT12, HOXB13, MAX, MEN1, MET, MLH1*, MRE11A, MSH2*   GERD (gastroesophageal reflux disease)    Headache    migraines   Hypertension    Hyperthyroidism 1990s   PONV (postoperative nausea and vomiting)    Shortness of breath dyspnea    with anxiety   Small bowel obstruction (HCC)    Tick bite of abdomen 05/13/2017     Past Surgical History: Past Surgical History:  Procedure Laterality Date   ENTEROSCOPY N/A 05/12/2019   Procedure: ENTEROSCOPY;  Surgeon: Doran Stabler, MD;  Location: [facility];  Service: Gastroenterology;  Laterality: N/A;   FOOT SURGERY Right    INCISE AND DRAIN ABCESS  2005   LAPAROSCOPIC ASSISTED VAGINAL HYSTERECTOMY N/A 07/11/2015   Procedure: LAPAROSCOPIC ASSISTED VAGINAL Total HYSTERECTOMY;  Surgeon: Osborne Oman, MD;  Location: [facility];  Service: Gynecology;  Laterality: N/A;   LAPAROSCOPIC BILATERAL SALPINGECTOMY Bilateral 07/11/2015   Procedure: LAPAROSCOPIC BILATERAL SALPINGO OOPHORECTOMY ;  Surgeon: Osborne Oman, MD;  Location: [facility];  Service: Gynecology;  Laterality: Bilateral;   tubal ligation  2000   WISDOM TOOTH EXTRACTION      Home Medications: Prior to Admission medications   Medication Sig Start Date End Date Taking? Authorizing Provider  cetirizine  (ZYRTEC) 10 MG tablet Take 1 tablet (10 mg total) by mouth daily. 08/11/20  Yes Wilber Oliphant, MD  EPINEPHrine 0.3 mg/0.3 mL IJ SOAJ injection Inject 0.3 mg into the muscle as needed for anaphylaxis (for difficulty breathing, throat, tongue or lip swelling. must come to ED after use.). 09/21/21  Yes Welborn, Ryan, DO  estradiol (ESTRACE) 2 MG tablet Take 1 tablet (2 mg total) by mouth daily. 03/05/21  Yes Benay Pike, MD  fluticasone [facility]) 50 MCG/ACT nasal spray USE 1 SPRAY IN EACH NOSTRIL ONCE DAILY FOR 14 DAYS 10/04/21  Yes Lurline Del, DO  ibuprofen (ADVIL) 800 MG tablet Take 800 mg by mouth 3 (three) times daily. 11/16/20  Yes [provider]  methocarbamol (ROBAXIN) 750 MG tablet TAKE 1 TABLET BY MOUTH FOUR TIMES A DAY 09/27/21  Yes Welborn, Ryan, DO  omeprazole (PRILOSEC) 20 MG capsule Take 1 capsule (20 mg total) by mouth daily. 09/21/21  Yes Welborn, Ryan, DO  ondansetron (ZOFRAN) 4 MG tablet Take 1 tablet (4 mg total) by mouth every 4 (four) hours as needed for nausea or vomiting. 09/21/21  Yes Welborn, Ryan, DO  oxcarbazepine (TRILEPTAL) 600 MG tablet Take 1 tablet (600 mg total) by mouth 2 (two) times daily. 01/19/21  Yes Milus Banister C, DO  polyethylene glycol powder (GLYCOLAX/MIRALAX) 17 GM/SCOOP powder Take 17 g by mouth daily. 09/21/21  Yes Welborn, Ryan, DO  pravastatin (PRAVACHOL) 40 MG tablet Take 1 tablet (40 mg total) by mouth daily. 02/22/21  Yes Darrelyn Hillock N, DO  propranolol ER (INDERAL LA) 60 MG 24 hr capsule Take 1 capsule (60 mg total) by mouth daily. 09/21/21  Yes Welborn, Ryan, DO  SPIRIVA RESPIMAT 2.5 MCG/ACT AERS Inhale 2 puffs into the lungs daily. 08/14/21  Yes Lattie Haw, MD    Allergies: Allergies  Allergen Reactions   Azithromycin Anaphylaxis   Meloxicam Nausea And Vomiting   Sumatriptan Nausea Only, Rash and Other (See Comments)    Throat felt like it was closing up   Penicillins Nausea Only and Rash    Has patient had a PCN reaction  causing immediate rash, facial/tongue/throat swelling, SOB or lightheadedness with hypotension: no Has patient had a PCN reaction causing severe rash involving mucus membranes or skin necrosis:unknown Has patient had a PCN reaction that required hospitalization : yes Has patient had a PCN reaction occurring within the last 10 years: no If all of the above answers are "NO", then may proceed with Cephalosporin use.    Sulfa Antibiotics Nausea Only and Rash    Social History:  reports that she has been smoking cigarettes. She started smoking about 34 years ago. She has a 15.00 pack-year smoking history. She has never used smokeless tobacco. She reports that she does not currently use alcohol. She reports that she does not use drugs.   Family History: Family History  Problem Relation Age of Onset   Ovarian cancer Mother 2       d. 19   Heart disease Father    Hypertension Father    COPD Father    Hypertension  Paternal Aunt 34   Lung cancer Paternal Aunt 14   Breast cancer Paternal Aunt 69   Thyroid disease Maternal Grandmother    Lymphoma Maternal Grandmother 62       NHL, recurrance at 60   Lung cancer Maternal Grandmother 61   Hypertension Paternal Grandmother    Heart disease Paternal Grandmother    Heart Problems Paternal Grandmother    Dementia Paternal Grandmother    Colon cancer Neg Hx    Esophageal cancer Neg Hx    Stomach cancer Neg Hx    Pancreatic cancer Neg Hx     Review of Systems: Review of Systems  Constitutional:  Negative for chills and fever.  HENT:  Negative for hearing loss.   Respiratory:  Negative for shortness of breath.   Cardiovascular:  Negative for chest pain.  Gastrointestinal:  Positive for abdominal pain, constipation, nausea and vomiting. Negative for diarrhea.  Genitourinary:  Negative for dysuria.  Musculoskeletal:  Negative for myalgias.  Skin:  Negative for rash.  Neurological:  Negative for dizziness.  Psychiatric/Behavioral:  Negative  for depression.    Physical Exam BP (!) 155/104   Pulse 67   Temp 98 F (36.7 C) (Oral)   Ht _0  (1.626 m)   Wt 214 lb 12.8 oz (97.4 kg)   LMP  (LMP Unknown) Comment: on Megace  SpO2 97%   BMI 36.87 kg/m  CONSTITUTIONAL: No acute distress HEENT:  Normocephalic, atraumatic, extraocular motion intact. NECK: Trachea is midline, and there is no jugular venous distension.  RESPIRATORY:  Lungs are clear, and breath sounds are equal bilaterally. Normal respiratory effort without pathologic use of accessory muscles. CARDIOVASCULAR: Heart is regular without murmurs, gallops, or rubs. GI: The abdomen is soft, obese, nondistended, currently with discomfort to palpation in the lower epigastric region above the umbilicus.  No peritoneal signs.  Prior laparoscopic incisions are well-healed. MUSCULOSKELETAL:  Normal muscle strength and tone in all four extremities.  No peripheral edema or cyanosis. SKIN: Skin turgor is normal. There are no pathologic skin lesions.  NEUROLOGIC:  Motor and sensation is grossly normal.  Cranial nerves are grossly intact. PSYCH:  Alert and oriented to person, place and time. Affect is normal.  Laboratory Analysis: Labs from 07/24/2021: Sodium 125, potassium 3.8, chloride 95, CO2 23, BUN 8, creatinine 0.73.  Total bilirubin 0.3, AST 13, ALT 16, alkaline phosphatase 83.  WBC 8.0, hemoglobin 14.4, hematocrit 42.3, platelets 270  Imaging: CT scan of abdomen and pelvis on 08/27/2021: IMPRESSION: 2 adjacent small bowel intussusceptions in the proximal jejunum. Abnormal jejunal wall thickening in this region could be due to enteritis, although a lead mass cannot be excluded. Consider further evaluation with capsule endoscopy.   No other sites of bowel disease identified. No evidence of bowel obstruction.  Assessment and Plan: This is a 50 y.o. female with longstanding history of abdominal pain with findings showing worsening proximal jejunal wall thickening and now  progressing to intussusception.  - Reviewed with the patient the findings on her imaging studies showing out intussusception of tube areas of the proximal jejunum.  Discussed with the patient that given her progressing and continuing pain, I think it is reasonable to proceed with surgical intervention.  I would agree with the plan for Dr. Rosendo Gros for diagnostic laparoscopy with possible small bowel resection and possible lysis of adhesions.  Discussed with her that we could potentially do this with a minimally invasive robotic approach however there is a possibility that we may have to do  open surgery.  Reviewed with her the surgery at length particularly risks of bleeding, infection, injury to surrounding structures, need for open surgery, hospital stay, postop recovery, and she is willing to proceed. - We will schedule patient for robotic assisted diagnostic laparoscopy, possible small bowel resection, possible lysis of adhesions on 10/16/2021.  We will obtain medical clearance from her PCP.  I spent 80 minutes dedicated to the care of this patient on the date of this encounter to include pre-visit review of records, face-to-face time with the patient discussing diagnosis and management, and any post-visit coordination of care.   Melvyn Neth, [facility]

## 2021-10-05 NOTE — Patient Instructions (Addendum)
We have spoken today about having a diagnostic laparoscopy with possible small bowel resection. This would be done at [facility] by Dr Hampton Abbot.  After surgery you would stay in the hospital for a few days. You would expect to be out of work for 1-2 weeks. You would have lifting restrictions of no more than 15 pounds for 6 weeks after surgery.  Please see your Blue surgery sheet given to you today.  Our surgery scheduler will call you to go over surgery dates and to go over information for surgery.    Minimally Invasive Small Bowel Resection Small bowel resection is surgery to remove part of the small bowel. The small bowel, also called the small intestine, is the top part of the intestines. It is part of the digestive system. When food leaves the stomach, it goes into the small bowel. A small bowel resection may be needed if the small bowel becomes blocked or harmed by disease. In the minimally invasive technique, the surgeon does the procedure through several small incisions in the abdomen. A thin scope with a camera (laparoscope) is inserted through one incision. Images from the camera are sent to a monitor in the operating room to help the surgeon see inside your body. Surgical instruments are inserted through the other incisions. After removing part of the bowel, the surgeon will join the remaining parts together so that food can pass through again. Tell a health care provider about: Any allergies you have. All medicines you are taking, including vitamins, herbs, eye drops, creams, and over-the-counter medicines. Any problems you or family members have had with anesthetic medicines. Any blood disorders you have. Any surgeries you have had. Any medical conditions you have. Whether you are pregnant or may be pregnant. What are the risks? Generally, this is a safe procedure. However, problems may occur, including: Infection. Bleeding. Damage to nearby structures or organs. Allergic reactions to  medicines. Problems with the bowel, such as: Leakage from where the bowel is put back together. This can cause the contents from the intestines to leak into the abdomen. Another surgery may be required to repair the leak. A long delay before the return of bowel function (ileus). Not being able to absorb enough vitamins and nutrition through the small bowel. A blood clot that forms somewhere in the veins and travels to the lung (pulmonary embolism). A hernia. This occurs when the abdomen bulges out. It may require surgery in the future. Scarring where the incision is made or inside your body, around the intestines. If scarring occurs inside the body, surgery may be required in the future. What happens before the procedure? Staying hydrated Follow instructions from your health care provider about hydration.  Medicines Ask your health care provider about: Changing or stopping your regular medicines. This is especially important if you are taking diabetes medicines or blood thinners. Taking medicines such as aspirin and ibuprofen. These medicines can thin your blood. Do not take these medicines unless your health care provider tells you to take them. Taking over-the-counter medicines, vitamins, herbs, and supplements. You may need to take medicine to clean out your bowels before surgery (bowel prep). General instructions Ask your health care provider: How your surgery site will be marked. What steps will be taken to help prevent infection. These may include: Removing hair at the surgery site. Washing skin with a germ-killing soap. Taking antibiotic medicine. You may be asked to shower with a germ-killing soap. You may have testing, such as blood tests, X-rays, or  other imaging scans that take pictures of the small bowel. Do not use any products that contain nicotine or tobacco for at least 4 weeks before the procedure. These products include cigarettes, e-cigarettes, and chewing tobacco. If you  need help quitting, ask your health care provider. Plan to have someone take you home from the hospital or clinic. Plan to have a responsible adult care for you for at least 24 hours after you leave the hospital or clinic. This is important. What happens during the procedure?  An IV will be inserted into one of your veins. You will be given one or both of the following: A medicine to help you relax (sedative). A medicine to make you fall asleep (general anesthetic). Tubes may be put into your body. A breathing tube will be placed into your lungs to help you breathe during the procedure. A small, thin tube (catheter) will be put into your bladder to drain urine. A tube may be put through your nose and into your stomach (nasogastric tube, or NG tube). It will be used to remove stomach fluid after surgery until the intestines start working again. Several small incisions will be made in your abdomen. Your abdomen will be filled with a gas so it expands. This will give the surgeon more room to operate and will make your organs easier to see. The laparoscope will be put through one of the small incisions. Hollow tubes will be put through the other incisions. The tools needed for the procedure will be put through these tubes. The portion of the small bowel to be removed will be taken out through one of the incisions. If the diseased bowel is too large to be removed through one of the smaller incisions: The incision will be enlarged to allow the surgeon to remove the bowel. The bowel repair will be made outside the abdomen and then the new bowel connection will be returned to the abdomen. The surgeon will connect the bowel together again. The incisions will be closed with staples or stitches (sutures). The procedure may vary among health care providers and hospitals. What happens after the procedure? Your blood pressure, heart rate, breathing rate, and blood oxygen level will be monitored until you  leave the hospital or clinic. Once the nasogastric tube has been removed from your nose, you will start to take liquids by mouth. You will slowly be advanced back to solid foods. You will be given medicine for pain as needed. You will continue to get fluids through the IV for a while. You will be asked to get up and start walking within a day. This helps to keep blood clots from forming in your legs. You may be told to breathe deeply and to cough now and then. This keeps your airways open. You may need to wear compression stockings. These stockings help to prevent blood clots and reduce swelling in your legs. Summary A small bowel resection may be needed if the small bowel becomes blocked or harmed by disease. Before the procedure, follow instructions from your health care provider about taking medicines and about eating and drinking. During the procedure, several small incisions will be made in your abdomen, and the portion of the small bowel to be removed will be taken out through one of the incisions. After the procedure, your blood pressure, heart rate, breathing rate, and blood oxygen level will be monitored until you leave the hospital or clinic. This information is not intended to replace advice given to you by  your health care provider. Make sure you discuss any questions you have with your health care provider. Document Revised: 05/06/2019 Document Reviewed: 05/06/2019 Elsevier Patient Education  [place].

## 2021-10-08 ENCOUNTER — Telehealth: Payer: Self-pay | Admitting: Surgery

## 2021-10-08 NOTE — Telephone Encounter (Signed)
Received call back from patient.  She is now informed of all dates regarding her surgery and verbalized understanding.

## 2021-10-08 NOTE — Telephone Encounter (Signed)
Outgoing call is made, left message for patient to call.  Please inform patient of  Pre-Admission date/time, COVID Testing date and Surgery date.  Surgery Date: 10/16/21 Preadmission Testing Date: 10/10/21 (phone 1p-5p) Covid Testing Date: 10/12/21 @ 8:45 am,  patient advised to go to the [facility] ([place]) between 8a-12:00 pm  Also patient will need to call at [phone], between 1-3:00pm the day before surgery, to find out what time to arrive for surgery.

## 2021-10-09 ENCOUNTER — Encounter: Payer: Self-pay | Admitting: Family Medicine

## 2021-10-10 ENCOUNTER — Other Ambulatory Visit: Payer: Self-pay

## 2021-10-10 ENCOUNTER — Encounter
Admission: RE | Admit: 2021-10-10 | Discharge: 2021-10-10 | Disposition: A | Discharge status: Home / Self Care | Payer: 59 | Source: Ambulatory Visit | Attending: Surgery | Admitting: Surgery

## 2021-10-10 NOTE — Pre-Procedure Instructions (Addendum)
Spoke to Amy at pt's PCP office to ask if they will get an EKG on her at the appt scheduled for 8:45 10/11/21. (Labs if indicated as well) She notified me she left a note in pt's chart regarding such.

## 2021-10-10 NOTE — Patient Instructions (Addendum)
Your procedure is scheduled on: 10/16/21 Report to [place]. To find out your arrival time please call [phone] between 1PM - 3PM on 10/15/21.  Remember: Instructions that are not followed completely may result in serious medical risk, up to and including death, or upon the discretion of your surgeon and anesthesiologist your surgery may need to be rescheduled.     _X__ 1. FOLLOW DR PISCOYA'S PRE OP DIET INSTRUCTIONS (if none provided no food or liquids after midnight 10/15/21)  __X__2.  On the morning of surgery brush your teeth with toothpaste and water, you                 may rinse your mouth with mouthwash if you wish.  Do not swallow any              toothpaste of mouthwash.     _X__ 3.  No Alcohol for 24 hours before or after surgery.   _X__ 4.  Do Not Smoke or use e-cigarettes For 24 Hours Prior to Your Surgery.                 Do not use any chewable tobacco products for at least 6 hours prior to                 surgery.  ____  5.  Bring all medications with you on the day of surgery if instructed.   __X__  6.  Notify your doctor if there is any change in your medical condition      (cold, fever, infections).     Do not wear jewelry, make-up, hairpins, clips or nail polish. Do not wear lotions, powders, or perfumes.  Do not shave body hair 48 hours prior to surgery. Men may shave face and neck. Do not bring valuables to the hospital.    [facility] is not responsible for any belongings or valuables.  Contacts, dentures/partials or body piercings may not be worn into surgery. Bring a case for your contacts, glasses or hearing aids, a denture cup will be supplied. Leave your suitcase in the car. After surgery it may be brought to your room. For patients admitted to the hospital, discharge time is determined by your treatment team.   Patients discharged the day of surgery will not be allowed to drive home.   Please  read over the following fact sheets that you were given:   CHG soap  __X__ Take these medicines the morning of surgery with A SIP OF WATER:    1. cetirizine (ZYRTEC) 10 MG tablet  2. omeprazole (PRILOSEC) 20 MG capsule  3. oxcarbazepine (TRILEPTAL) 600 MG tablet  4. propranolol ER (INDERAL LA) 60 MG 24 hr capsule  5.  6.  ____ Fleet Enema (as directed)   __X__ Use CHG Soap/SAGE wipes as directed  __X__ Use inhalers on the day of surgery    USE SPIRIVA AND ALBUTEROL  ____ Stop metformin/Janumet/Farxiga 2 days prior to surgery    ____ Take 1/2 of usual insulin dose the night before surgery. No insulin the morning          of surgery.   ____ Stop Blood Thinners Coumadin/Plavix/Xarelto/Pleta/Pradaxa/Eliquis/Effient/Aspirin  on   Or contact your Surgeon, Cardiologist or Medical Doctor regarding  ability to stop your blood thinners  __X__ Stop Anti-inflammatories 7 days before surgery such as Advil, Ibuprofen, Motrin,  BC or Goodies Powder, Naprosyn, Naproxen, Aleve, Aspirin   You may  use Tylenol if needed  __X__ Stop all herbal supplements, fish oil or vitamin E until after surgery.    ____ Bring C-Pap to the hospital.

## 2021-10-11 ENCOUNTER — Other Ambulatory Visit: Payer: Self-pay

## 2021-10-11 ENCOUNTER — Ambulatory Visit (INDEPENDENT_AMBULATORY_CARE_PROVIDER_SITE_OTHER): Payer: 59 | Admitting: Family Medicine

## 2021-10-11 ENCOUNTER — Ambulatory Visit (HOSPITAL_COMMUNITY)
Admission: RE | Admit: 2021-10-11 | Discharge: 2021-10-11 | Disposition: A | Discharge status: Home / Self Care | Payer: 59 | Source: Ambulatory Visit | Attending: Family Medicine | Admitting: Family Medicine

## 2021-10-11 VITALS — BP 132/76 | HR 76 | Wt 215.0 lb

## 2021-10-11 DIAGNOSIS — Z720 Tobacco use: Secondary | ICD-10-CM | POA: Insufficient documentation

## 2021-10-11 DIAGNOSIS — Z01818 Encounter for other preprocedural examination: Secondary | ICD-10-CM | POA: Diagnosis present

## 2021-10-11 DIAGNOSIS — Z131 Encounter for screening for diabetes mellitus: Secondary | ICD-10-CM | POA: Diagnosis not present

## 2021-10-11 DIAGNOSIS — I10 Essential (primary) hypertension: Secondary | ICD-10-CM

## 2021-10-11 LAB — POCT GLYCOSYLATED HEMOGLOBIN (HGB A1C): Hemoglobin A1C: 5.6 % (ref 4.0–5.6)

## 2021-10-11 NOTE — Progress Notes (Signed)
    SUBJECTIVE:   CHIEF COMPLAINT / HPI:   Surgical clearance visit Patient reports that she has part of her small intestine that need to be removed and she is in need of small bowel resection.  She was initially evaluated by an out of network Dr. And is having to move to an in network Dr.  This doctor request surgical clearance for the patient prior to her procedure next week.  He is requesting an EKG be obtained as well as Preop labs.  Patient reports she is doing well other than abdominal pain.  Denies any shortness of breath, chest pain, weakness in her upper extremities, slurred speech, any neurologic symptoms.  She reports that she has known COPD but has not had a COPD exacerbation in over 2 years and is well controlled on her medications.  OBJECTIVE:   BP 132/76   Pulse 76   Wt 215 lb (97.5 kg)   LMP  (LMP Unknown) Comment: on Megace  SpO2 97%   BMI 36.90 kg/m   General: Well-appearing 50 year old female in no acute distress Cardiac: Regular rate and rhythm, no murmurs appreciated Respiratory: Normal work of breathing, lungs are clear to auscultation bilaterally, no wheezes, crackles. Abdomen: Soft, positive bowel sounds, no masses appreciated MSK: No gross abnormalities  ASSESSMENT/PLAN:   Pre-operative clearance Patient presents for preoperative clearance.  Blood pressure within normal limits today.  No respiratory symptoms appreciated and patient reports well-controlled on COPD regimen.  EKG showing first-degree AV block with cannot rule out anterior septal infarct, age undetermined.  Patient denying any cardiac symptoms such as chest pain, shortness of breath.  Lyndel Safe preoperative risk calculator used with 0.2% risk as well as perioperative mortality predictor calculator showing 0.6% risk of mortality.  Low risk.  Collected CBC, BMP, hemoglobin A1c.  Hemoglobin A1c 5.7.    Gifford Shave, MD [place]

## 2021-10-11 NOTE — Patient Instructions (Signed)
It was great seeing you today.  Your lab results will be in your MyChart.  I have provided a letter for you to take your surgeon.  If any questions or concerns call the clinic.  Have a wonderful afternoon!

## 2021-10-11 NOTE — Assessment & Plan Note (Signed)
Patient presents for preoperative clearance.  Blood pressure within normal limits today.  No respiratory symptoms appreciated and patient reports well-controlled on COPD regimen.  EKG showing first-degree AV block with cannot rule out anterior septal infarct, age undetermined.  Patient denying any cardiac symptoms such as chest pain, shortness of breath.  Lyndel Safe preoperative risk calculator used with 0.2% risk as well as perioperative mortality predictor calculator showing 0.6% risk of mortality.  Low risk.  Collected CBC, BMP, hemoglobin A1c.  Hemoglobin A1c 5.7.

## 2021-10-12 ENCOUNTER — Other Ambulatory Visit
Admission: RE | Admit: 2021-10-12 | Discharge: 2021-10-12 | Disposition: A | Discharge status: Home / Self Care | Payer: 59 | Source: Ambulatory Visit | Attending: Surgery | Admitting: Surgery

## 2021-10-12 DIAGNOSIS — Z01812 Encounter for preprocedural laboratory examination: Secondary | ICD-10-CM | POA: Insufficient documentation

## 2021-10-12 DIAGNOSIS — Z20822 Contact with and (suspected) exposure to covid-19: Secondary | ICD-10-CM | POA: Diagnosis not present

## 2021-10-12 LAB — SARS CORONAVIRUS 2 (TAT 6-24 HRS): SARS Coronavirus 2: NEGATIVE

## 2021-10-12 LAB — CBC
Hematocrit: 42.9 % (ref 34.0–46.6)
Hemoglobin: 15.2 g/dL (ref 11.1–15.9)
MCH: 31.5 pg (ref 26.6–33.0)
MCHC: 35.4 g/dL (ref 31.5–35.7)
MCV: 89 fL (ref 79–97)
Platelets: 265 10*3/uL (ref 150–450)
RBC: 4.82 x10E6/uL (ref 3.77–5.28)
RDW: 12.3 % (ref 11.7–15.4)
WBC: 5.8 10*3/uL (ref 3.4–10.8)

## 2021-10-12 LAB — BASIC METABOLIC PANEL
BUN/Creatinine Ratio: 17 (ref 9–23)
BUN: 14 mg/dL (ref 6–24)
CO2: 22 mmol/L (ref 20–29)
Calcium: 9.1 mg/dL (ref 8.7–10.2)
Chloride: 98 mmol/L (ref 96–106)
Creatinine, Ser: 0.84 mg/dL (ref 0.57–1.00)
Glucose: 90 mg/dL (ref 70–99)
Potassium: 5.1 mmol/L (ref 3.5–5.2)
Sodium: 131 mmol/L — ABNORMAL LOW (ref 134–144)
eGFR: 86 mL/min/{1.73_m2} (ref >59)

## 2021-10-12 NOTE — Progress Notes (Signed)
Clearance has been received from Dr Luz Lex office. The patient is cleared at Low risk for surgery.

## 2021-10-16 ENCOUNTER — Other Ambulatory Visit: Payer: Self-pay

## 2021-10-16 ENCOUNTER — Inpatient Hospital Stay: Payer: 59 | Admitting: Anesthesiology

## 2021-10-16 ENCOUNTER — Encounter
Admission: RE | Disposition: A | Discharge status: Home / Self Care | Payer: Self-pay | Source: Home / Self Care | Attending: Surgery

## 2021-10-16 ENCOUNTER — Encounter: Payer: Self-pay | Admitting: Surgery

## 2021-10-16 ENCOUNTER — Ambulatory Visit
Admission: RE | Admit: 2021-10-16 | Discharge: 2021-10-16 | Disposition: A | Discharge status: Home / Self Care | Payer: 59 | Attending: Surgery | Admitting: Surgery

## 2021-10-16 DIAGNOSIS — R109 Unspecified abdominal pain: Secondary | ICD-10-CM | POA: Diagnosis not present

## 2021-10-16 DIAGNOSIS — Z881 Allergy status to other antibiotic agents status: Secondary | ICD-10-CM | POA: Diagnosis not present

## 2021-10-16 DIAGNOSIS — F17210 Nicotine dependence, cigarettes, uncomplicated: Secondary | ICD-10-CM | POA: Insufficient documentation

## 2021-10-16 DIAGNOSIS — Z7984 Long term (current) use of oral hypoglycemic drugs: Secondary | ICD-10-CM | POA: Diagnosis not present

## 2021-10-16 DIAGNOSIS — Z87892 Personal history of anaphylaxis: Secondary | ICD-10-CM | POA: Diagnosis not present

## 2021-10-16 DIAGNOSIS — Z880 Allergy status to penicillin: Secondary | ICD-10-CM | POA: Diagnosis not present

## 2021-10-16 DIAGNOSIS — Z79899 Other long term (current) drug therapy: Secondary | ICD-10-CM | POA: Diagnosis not present

## 2021-10-16 DIAGNOSIS — Z882 Allergy status to sulfonamides status: Secondary | ICD-10-CM | POA: Diagnosis not present

## 2021-10-16 DIAGNOSIS — K561 Intussusception: Secondary | ICD-10-CM

## 2021-10-16 DIAGNOSIS — Z886 Allergy status to analgesic agent status: Secondary | ICD-10-CM | POA: Diagnosis not present

## 2021-10-16 HISTORY — PX: XI ROBOT ASSISTED DIAGNOSTIC LAPAROSCOPY: SHX6815

## 2021-10-16 SURGERY — LAPAROSCOPY, DIAGNOSTIC, ROBOT-ASSISTED
Anesthesia: General

## 2021-10-16 MED ORDER — SUCCINYLCHOLINE CHLORIDE 200 MG/10ML IV SOSY
PREFILLED_SYRINGE | INTRAVENOUS | Status: DC | PRN
Start: 2021-10-16 — End: 2021-10-16
  Administered 2021-10-16: 100 mg via INTRAVENOUS

## 2021-10-16 MED ORDER — APREPITANT 40 MG PO CAPS: 40.0000 mg | ORAL_CAPSULE | Freq: Once | ORAL | Status: AC

## 2021-10-16 MED ORDER — DEXAMETHASONE SODIUM PHOSPHATE 10 MG/ML IJ SOLN
INTRAMUSCULAR | Status: DC | PRN
  Administered 2021-10-16: 5 mg via INTRAVENOUS

## 2021-10-16 MED ORDER — BUPIVACAINE LIPOSOME 1.3 % IJ SUSP
INTRAMUSCULAR | Status: AC
  Filled 2021-10-16: qty 20

## 2021-10-16 MED ORDER — OXYCODONE HCL 5 MG PO TABS
5.0000 mg | ORAL_TABLET | Freq: Once | ORAL | Status: AC | PRN
  Administered 2021-10-16: 5 mg via ORAL

## 2021-10-16 MED ORDER — ACETAMINOPHEN 500 MG PO TABS: 1000.0000 mg | ORAL_TABLET | ORAL | Status: AC

## 2021-10-16 MED ORDER — FENTANYL CITRATE (PF) 250 MCG/5ML IJ SOLN
INTRAMUSCULAR | Status: AC
  Filled 2021-10-16: qty 5

## 2021-10-16 MED ORDER — ACETAMINOPHEN 500 MG PO TABS
ORAL_TABLET | ORAL | Status: AC
  Administered 2021-10-16: 1000 mg via ORAL
  Filled 2021-10-16: qty 2

## 2021-10-16 MED ORDER — ALVIMOPAN 12 MG PO CAPS
ORAL_CAPSULE | ORAL | Status: AC
  Administered 2021-10-16: 12 mg via ORAL
  Filled 2021-10-16: qty 1

## 2021-10-16 MED ORDER — OXYCODONE HCL 5 MG PO TABS
ORAL_TABLET | ORAL | Status: AC
  Filled 2021-10-16: qty 1

## 2021-10-16 MED ORDER — HYDROMORPHONE HCL 1 MG/ML IJ SOLN
INTRAMUSCULAR | Status: AC
  Administered 2021-10-16: 0.5 mg via INTRAVENOUS
  Filled 2021-10-16: qty 1

## 2021-10-16 MED ORDER — FENTANYL CITRATE (PF) 100 MCG/2ML IJ SOLN
25.0000 ug | INTRAMUSCULAR | Status: DC | PRN
  Administered 2021-10-16: 25 ug via INTRAVENOUS
  Administered 2021-10-16: 50 ug via INTRAVENOUS

## 2021-10-16 MED ORDER — APREPITANT 40 MG PO CAPS
ORAL_CAPSULE | ORAL | Status: AC
  Administered 2021-10-16: 40 mg via ORAL
  Filled 2021-10-16: qty 1

## 2021-10-16 MED ORDER — ACETAMINOPHEN 10 MG/ML IV SOLN: 1000.0000 mg | Freq: Once | INTRAVENOUS | Status: DC | PRN

## 2021-10-16 MED ORDER — ROCURONIUM BROMIDE 100 MG/10ML IV SOLN
INTRAVENOUS | Status: DC | PRN
  Administered 2021-10-16: 50 mg via INTRAVENOUS

## 2021-10-16 MED ORDER — ORAL CARE MOUTH RINSE: 15.0000 mL | Freq: Once | OROMUCOSAL | Status: AC

## 2021-10-16 MED ORDER — CIPROFLOXACIN IN D5W 400 MG/200ML IV SOLN
INTRAVENOUS | Status: AC
  Administered 2021-10-16: 400 mg via INTRAVENOUS
  Filled 2021-10-16: qty 200

## 2021-10-16 MED ORDER — CHLORHEXIDINE GLUCONATE CLOTH 2 % EX PADS: 6.0000 | MEDICATED_PAD | Freq: Once | CUTANEOUS | Status: DC

## 2021-10-16 MED ORDER — ONDANSETRON HCL 4 MG/2ML IJ SOLN
INTRAMUSCULAR | Status: AC
  Filled 2021-10-16: qty 2

## 2021-10-16 MED ORDER — DEXAMETHASONE SODIUM PHOSPHATE 10 MG/ML IJ SOLN
INTRAMUSCULAR | Status: AC
  Filled 2021-10-16: qty 1

## 2021-10-16 MED ORDER — BUPIVACAINE LIPOSOME 1.3 % IJ SUSP: 20.0000 mL | Freq: Once | INTRAMUSCULAR | Status: DC

## 2021-10-16 MED ORDER — SUCCINYLCHOLINE CHLORIDE 200 MG/10ML IV SOSY
PREFILLED_SYRINGE | INTRAVENOUS | Status: AC
  Filled 2021-10-16: qty 10

## 2021-10-16 MED ORDER — EPHEDRINE SULFATE 50 MG/ML IJ SOLN
INTRAMUSCULAR | Status: DC | PRN
  Administered 2021-10-16: 10 mg via INTRAVENOUS

## 2021-10-16 MED ORDER — ROCURONIUM BROMIDE 10 MG/ML (PF) SYRINGE
PREFILLED_SYRINGE | INTRAVENOUS | Status: AC
  Filled 2021-10-16: qty 10

## 2021-10-16 MED ORDER — METRONIDAZOLE 500 MG/100ML IV SOLN
500.0000 mg | INTRAVENOUS | Status: AC
  Administered 2021-10-16: 500 mg via INTRAVENOUS
  Filled 2021-10-16: qty 100

## 2021-10-16 MED ORDER — MIDAZOLAM HCL 2 MG/2ML IJ SOLN
INTRAMUSCULAR | Status: DC | PRN
  Administered 2021-10-16: 2 mg via INTRAVENOUS

## 2021-10-16 MED ORDER — CHLORHEXIDINE GLUCONATE 0.12 % MT SOLN
OROMUCOSAL | Status: AC
  Administered 2021-10-16: 15 mL via OROMUCOSAL
  Filled 2021-10-16: qty 15

## 2021-10-16 MED ORDER — LIDOCAINE HCL (CARDIAC) PF 100 MG/5ML IV SOSY
PREFILLED_SYRINGE | INTRAVENOUS | Status: DC | PRN
  Administered 2021-10-16: 100 mg via INTRAVENOUS

## 2021-10-16 MED ORDER — LACTATED RINGERS IV SOLN: INTRAVENOUS | Status: DC

## 2021-10-16 MED ORDER — GABAPENTIN 300 MG PO CAPS
ORAL_CAPSULE | ORAL | Status: AC
  Administered 2021-10-16: 300 mg via ORAL
  Filled 2021-10-16: qty 1

## 2021-10-16 MED ORDER — CHLORHEXIDINE GLUCONATE 0.12 % MT SOLN: 15.0000 mL | Freq: Once | OROMUCOSAL | Status: AC

## 2021-10-16 MED ORDER — ACETAMINOPHEN 500 MG PO TABS: 1000.0000 mg | ORAL_TABLET | Freq: Four times a day (QID) | ORAL | Status: DC | PRN

## 2021-10-16 MED ORDER — SUGAMMADEX SODIUM 200 MG/2ML IV SOLN
INTRAVENOUS | Status: DC | PRN
  Administered 2021-10-16: 200 mg via INTRAVENOUS

## 2021-10-16 MED ORDER — PROMETHAZINE HCL 25 MG/ML IJ SOLN: 6.2500 mg | INTRAMUSCULAR | Status: DC | PRN

## 2021-10-16 MED ORDER — OXYCODONE HCL 5 MG PO TABS: 5.0000 mg | ORAL_TABLET | ORAL | 0 refills | Status: DC | PRN

## 2021-10-16 MED ORDER — EPHEDRINE 5 MG/ML INJ
INTRAVENOUS | Status: AC
  Filled 2021-10-16: qty 5

## 2021-10-16 MED ORDER — 0.9 % SODIUM CHLORIDE (POUR BTL) OPTIME
TOPICAL | Status: DC | PRN
  Administered 2021-10-16: 10 mL

## 2021-10-16 MED ORDER — PROPOFOL 10 MG/ML IV BOLUS
INTRAVENOUS | Status: DC | PRN
  Administered 2021-10-16: 200 mg via INTRAVENOUS

## 2021-10-16 MED ORDER — CIPROFLOXACIN IN D5W 400 MG/200ML IV SOLN: 400.0000 mg | INTRAVENOUS | Status: AC

## 2021-10-16 MED ORDER — FENTANYL CITRATE (PF) 100 MCG/2ML IJ SOLN
INTRAMUSCULAR | Status: AC
  Administered 2021-10-16: 25 ug via INTRAVENOUS
  Filled 2021-10-16: qty 2

## 2021-10-16 MED ORDER — MIDAZOLAM HCL 2 MG/2ML IJ SOLN
INTRAMUSCULAR | Status: AC
  Filled 2021-10-16: qty 2

## 2021-10-16 MED ORDER — FENTANYL CITRATE (PF) 100 MCG/2ML IJ SOLN
INTRAMUSCULAR | Status: DC | PRN
  Administered 2021-10-16: 75 ug via INTRAVENOUS
  Administered 2021-10-16: 50 ug via INTRAVENOUS
  Administered 2021-10-16: 75 ug via INTRAVENOUS
  Administered 2021-10-16: 50 ug via INTRAVENOUS

## 2021-10-16 MED ORDER — PROPOFOL 10 MG/ML IV BOLUS
INTRAVENOUS | Status: AC
  Filled 2021-10-16: qty 20

## 2021-10-16 MED ORDER — OXYCODONE HCL 5 MG/5ML PO SOLN: 5.0000 mg | Freq: Once | ORAL | Status: AC | PRN

## 2021-10-16 MED ORDER — ALVIMOPAN 12 MG PO CAPS: 12.0000 mg | ORAL_CAPSULE | ORAL | Status: AC

## 2021-10-16 MED ORDER — LIDOCAINE HCL (PF) 2 % IJ SOLN
INTRAMUSCULAR | Status: AC
  Filled 2021-10-16: qty 5

## 2021-10-16 MED ORDER — HYDROMORPHONE HCL 1 MG/ML IJ SOLN
0.2500 mg | INTRAMUSCULAR | Status: DC | PRN
  Administered 2021-10-16: 0.5 mg via INTRAVENOUS

## 2021-10-16 MED ORDER — BUPIVACAINE-EPINEPHRINE 0.5% -1:200000 IJ SOLN
INTRAMUSCULAR | Status: DC | PRN
  Administered 2021-10-16: 30 mL

## 2021-10-16 MED ORDER — GABAPENTIN 300 MG PO CAPS: 300.0000 mg | ORAL_CAPSULE | ORAL | Status: AC

## 2021-10-16 MED ORDER — BUPIVACAINE-EPINEPHRINE (PF) 0.5% -1:200000 IJ SOLN
INTRAMUSCULAR | Status: AC
  Filled 2021-10-16: qty 30

## 2021-10-16 SURGICAL SUPPLY — 89 items
ADH SKN CLS APL DERMABOND .7 (GAUZE/BANDAGES/DRESSINGS)
APL PRP STRL LF DISP 70% ISPRP (MISCELLANEOUS) ×1
CANNULA REDUC XI 12-8 STAPL (CANNULA) ×2
CANNULA REDUCER 12-8 DVNC XI (CANNULA) ×1 IMPLANT
CHLORAPREP W/TINT 26 (MISCELLANEOUS) ×2 IMPLANT
CLIP LIGATING HEM O LOK PURPLE (MISCELLANEOUS) IMPLANT
COVER MAYO STAND REUSABLE (DRAPES) IMPLANT
COVER TIP SHEARS 8 DVNC (MISCELLANEOUS) ×1 IMPLANT
COVER TIP SHEARS 8MM DA VINCI (MISCELLANEOUS) ×2
DECANTER SPIKE VIAL GLASS SM (MISCELLANEOUS) IMPLANT
DEFOGGER SCOPE WARMER CLEARIFY (MISCELLANEOUS) ×2 IMPLANT
DERMABOND ADVANCED (GAUZE/BANDAGES/DRESSINGS)
DERMABOND ADVANCED .7 DNX12 (GAUZE/BANDAGES/DRESSINGS) IMPLANT
DRAPE ARM DVNC X/XI (DISPOSABLE) ×4 IMPLANT
DRAPE COLUMN DVNC XI (DISPOSABLE) ×1 IMPLANT
DRAPE DA VINCI XI ARM (DISPOSABLE) ×8
DRAPE DA VINCI XI COLUMN (DISPOSABLE) ×2
DRAPE LEGGINS SURG 28X43 STRL (DRAPES) IMPLANT
DRAPE UNDER BUTTOCK W/FLU (DRAPES) IMPLANT
ELECT CAUTERY BLADE TIP 2.5 (TIP) ×2
ELECT EZSTD 165MM 6.5IN (MISCELLANEOUS) ×2
ELECT REM PT RETURN 9FT ADLT (ELECTROSURGICAL) ×2
ELECTRODE CAUTERY BLDE TIP 2.5 (TIP) ×1 IMPLANT
ELECTRODE EZSTD 165MM 6.5IN (MISCELLANEOUS) ×1 IMPLANT
ELECTRODE REM PT RTRN 9FT ADLT (ELECTROSURGICAL) ×1 IMPLANT
GAUZE 4X4 16PLY ~~LOC~~+RFID DBL (SPONGE) ×2 IMPLANT
GLOVE SURG SYN 7.0 (GLOVE) ×6 IMPLANT
GLOVE SURG SYN 7.5  E (GLOVE) ×6
GLOVE SURG SYN 7.5 E (GLOVE) ×3 IMPLANT
GOWN STRL REUS W/ TWL LRG LVL3 (GOWN DISPOSABLE) ×5 IMPLANT
GOWN STRL REUS W/TWL LRG LVL3 (GOWN DISPOSABLE) ×10
GRASPER LAPSCPC 5X45 DSP (INSTRUMENTS) ×2 IMPLANT
GRASPER SUT TROCAR 14GX15 (MISCELLANEOUS) ×2 IMPLANT
HANDLE YANKAUER SUCT BULB TIP (MISCELLANEOUS) IMPLANT
IRRIGATOR SUCT 8 DISP DVNC XI (IRRIGATION / IRRIGATOR) IMPLANT
IRRIGATOR SUCTION 8MM XI DISP (IRRIGATION / IRRIGATOR)
IV NS 1000ML (IV SOLUTION)
IV NS 1000ML BAXH (IV SOLUTION) IMPLANT
KIT IMAGING PINPOINTPAQ (MISCELLANEOUS) IMPLANT
KIT PINK PAD W/HEAD ARE REST (MISCELLANEOUS) ×2
KIT PINK PAD W/HEAD ARM REST (MISCELLANEOUS) ×1 IMPLANT
LABEL OR SOLS (LABEL) ×2 IMPLANT
MANIFOLD NEPTUNE II (INSTRUMENTS) ×2 IMPLANT
NEEDLE HYPO 22GX1.5 SAFETY (NEEDLE) ×2 IMPLANT
NEEDLE INSUFFLATION 14GA 120MM (NEEDLE) ×2 IMPLANT
NS IRRIG 500ML POUR BTL (IV SOLUTION) ×2 IMPLANT
OBTURATOR OPTICAL STANDARD 8MM (TROCAR) ×2
OBTURATOR OPTICAL STND 8 DVNC (TROCAR) ×1
OBTURATOR OPTICALSTD 8 DVNC (TROCAR) ×1 IMPLANT
PACK COLON CLEAN CLOSURE (MISCELLANEOUS) IMPLANT
PACK LAP CHOLECYSTECTOMY (MISCELLANEOUS) ×2 IMPLANT
PAD PREP 24X41 OB/GYN DISP (PERSONAL CARE ITEMS) IMPLANT
PENCIL ELECTRO HAND CTR (MISCELLANEOUS) ×2 IMPLANT
RETRACTOR WOUND ALXS 18CM SML (MISCELLANEOUS) IMPLANT
RTRCTR WOUND ALEXIS O 18CM SML (MISCELLANEOUS)
SEAL CANN UNIV 5-8 DVNC XI (MISCELLANEOUS) ×4 IMPLANT
SEAL XI 5MM-8MM UNIVERSAL (MISCELLANEOUS) ×8
SEALER VESSEL DA VINCI XI (MISCELLANEOUS)
SEALER VESSEL EXT DVNC XI (MISCELLANEOUS) IMPLANT
SOL PREP PVP 2OZ (MISCELLANEOUS)
SOLUTION ELECTROLUBE (MISCELLANEOUS) ×2 IMPLANT
SOLUTION PREP PVP 2OZ (MISCELLANEOUS) IMPLANT
SPONGE T-LAP 18X18 ~~LOC~~+RFID (SPONGE) IMPLANT
SPONGE T-LAP 4X18 ~~LOC~~+RFID (SPONGE) IMPLANT
STAPLER CANNULA SEAL DVNC XI (STAPLE) ×1 IMPLANT
STAPLER CANNULA SEAL XI (STAPLE) ×2
STAPLER CIRCULAR MANUAL XL 25 (STAPLE) IMPLANT
STAPLER CIRCULAR MANUAL XL 29 (STAPLE) IMPLANT
STAPLER CIRCULAR MANUAL XL 33 (STAPLE) IMPLANT
SURGILUBE 2OZ TUBE FLIPTOP (MISCELLANEOUS) IMPLANT
SUT DVC VLOC 3-0 CL 6 P-12 (SUTURE) IMPLANT
SUT MNCRL 4-0 (SUTURE) ×4
SUT MNCRL 4-0 27XMFL (SUTURE) ×2
SUT MNCRL AB 4-0 PS2 18 (SUTURE) IMPLANT
SUT PDS AB 1 CT1 36 (SUTURE) IMPLANT
SUT PROLENE 2 0 SH DA (SUTURE) IMPLANT
SUT VIC AB 3-0 SH 27 (SUTURE) ×2
SUT VIC AB 3-0 SH 27X BRD (SUTURE) ×1 IMPLANT
SUT VICRYL 0 AB UR-6 (SUTURE) ×4 IMPLANT
SUT VLOC 90 S/L VL9 GS22 (SUTURE) IMPLANT
SUTURE MNCRL 4-0 27XMF (SUTURE) ×2 IMPLANT
SYR TOOMEY IRRIG 70ML (MISCELLANEOUS)
SYRINGE TOOMEY IRRIG 70ML (MISCELLANEOUS) IMPLANT
TAPE TRANSPORE STRL 2 31045 (GAUZE/BANDAGES/DRESSINGS) ×2 IMPLANT
TRAY FOLEY MTR SLVR 16FR STAT (SET/KITS/TRAYS/PACK) IMPLANT
TRAY FOLEY SLVR 16FR LF STAT (SET/KITS/TRAYS/PACK) ×2 IMPLANT
TROCAR BALLN GELPORT 12X130M (ENDOMECHANICALS) ×2 IMPLANT
TUBING EVAC SMOKE HEATED PNEUM (TUBING) ×2 IMPLANT
WATER STERILE IRR 500ML POUR (IV SOLUTION) ×2 IMPLANT

## 2021-10-16 NOTE — Transfer of Care (Signed)
Immediate Anesthesia Transfer of Care Note  Patient: Mariah Rice  Procedure(s) Performed: XI ROBOT ASSISTED DIAGNOSTIC LAPAROSCOPY  Patient Location: PACU  Anesthesia Type:General  Level of Consciousness: awake, alert  and oriented  Airway & Oxygen Therapy: Patient Spontanous Breathing and Patient connected to nasal cannula oxygen  Post-op Assessment: Report given to RN and Post -op Vital signs reviewed and stable  Post vital signs: Reviewed and stable  Last Vitals:  Vitals Value Taken Time  BP    Temp    Pulse    Resp    SpO2      Last Pain:  Vitals:   10/16/21 0802  TempSrc: Temporal  PainSc: 0-No pain         Complications: No notable events documented.

## 2021-10-16 NOTE — Anesthesia Preprocedure Evaluation (Addendum)
Anesthesia Evaluation  Patient identified by MRN, date of birth, ID band Patient awake    Reviewed: Allergy & Precautions, H&P , NPO status , Patient's Chart, lab work & pertinent test results  History of Anesthesia Complications (+) PONV and history of anesthetic complications  Airway Mallampati: III   Neck ROM: Full    Dental  (+) Missing,    Pulmonary shortness of breath, COPD,  COPD inhaler, Current Smoker and Patient abstained from smoking.,    breath sounds clear to auscultation       Cardiovascular Exercise Tolerance: Good METS:  Rhythm:regular Rate:Normal     Neuro/Psych  Headaches, PSYCHIATRIC DISORDERS Anxiety Depression  Neuromuscular disease    GI/Hepatic GERD  Poorly Controlled and Medicated,Small bowel intussusception   Endo/Other  Hyperthyroidism obese  Renal/GU      Musculoskeletal   Abdominal Normal abdominal exam  (+)   Peds  Hematology   Anesthesia Other Findings Obesity  Reproductive/Obstetrics                            Anesthesia Physical  Anesthesia Plan  ASA: 3  Anesthesia Plan: General   Post-op Pain Management:    Induction: Intravenous  PONV Risk Score and Plan: 4 or greater and Propofol infusion, Ondansetron, Dexamethasone, Midazolam and Aprepitant  Airway Management Planned: Oral ETT  Additional Equipment:   Intra-op Plan:   Post-operative Plan: Extubation in OR  Informed Consent: I have reviewed the patients History and Physical, chart, labs and discussed the procedure including the risks, benefits and alternatives for the proposed anesthesia with the patient or authorized representative who has indicated his/her understanding and acceptance.     Dental advisory given  Plan Discussed with: CRNA and Anesthesiologist  Anesthesia Plan Comments:        Anesthesia Quick Evaluation

## 2021-10-16 NOTE — Discharge Instructions (Signed)

## 2021-10-16 NOTE — Op Note (Signed)
Procedure Date:  10/16/2021  Pre-operative Diagnosis:  Small bowel intussusception  Post-operative Diagnosis: Abdominal pain  Procedure:  Robotic assisted diagnostic laparoscopy.  Surgeon:  Melvyn Neth, MD  Assistant:  Edison Simon, PA-C (his help was needed due to the potential complexity of the case, for bowel retraction and assistance with anastomosis and wound closure).  Also assisting, Douglass Rivers, PA-S.  Second opinion obtained from Dr. Caroleen Hamman.  Anesthesia:  General endotracheal  Estimated Blood Loss:  5 ml  Specimens:  None  Complications:  None  Findings:  The entire small bowel was evaluated from the Ligament of Treitz to the ileocecal valve.  The small bowel caliber was normal, with no areas of thickening or inflammatory changes or stenosis.  There were no mesenteric masses.  The colon also appeared normal without any inflammatory changes.  The stomach appeared within normal, and the gallbladder, though had some mild distention, did not have any signs of cholecystitis or adhesions.  Indications for Procedure:  This is a 50 y.o. female who presents with abdominal pain with an extensive workup.  Most recently, last month, she had a CT scan of abdomen and pelvis which showed small bowel intussusception of the proximal jejunum, without obstruction.  The benefits, complications, treatment options, and expected outcomes were discussed with the patient. The risks of bleeding, infection, recurrence of symptoms, failure to resolve symptoms, bowel injury, and need for further procedures were all discussed with the patient and she was willing to proceed.  Description of Procedure: The patient was correctly identified in the preoperative area and brought into the operating room.  The patient was placed supine with VTE prophylaxis in place.  Appropriate time-outs were performed.  Anesthesia was induced and the patient was intubated.  Appropriate antibiotics were  infused.  The abdomen was prepped and draped in a sterile fashion. An infraumbilical lower abdominal incision was made. A cutdown technique was used to enter the abdominal cavity without injury, and a 12 mm robotic port was inserted.  Pneumoperitoneum was obtained with appropriate opening pressures.  Three 8-mm ports and a 5 mm assist port were placed in the lower abdomen under direct visualization.  The DaVinci platform was docked, camera targeted, and instruments were placed under direct visualization.  The patient's omentum was retracted cephalad and the transverse colon was lifted.  This allowed visualization of the proximal jejunum at the Ligament of Treitz.  This area appeared normal without any inflammatory changes, adhesions, or stenosis/thickening.  The entire small bowel was run and evaluated all the way to the ileocecal valve.  I was not able to identify any areas of concern that would require resection.  As a precaution, I asked Dr. Dahlia Byes to come in to evaluate as well with me and he agreed that there was no evidence of any pathology.  I then inspected the visible portions of the colon including ascending, transverse, sigmoid colon and these also appeared normal.  The stomach was collapsed around the NG tube and the gallbladder did not have any adhesions or evidence of cholecystitis.  Pictures were taken of the proximal jejun, stomach, gallbladder, and sigmoid colon in the pelvis for further reference.  The 12 mm port was closed using PMI endoclose technique using a 0 Vicryl suture.  The 8 mm ports were removed under direct visualization.  Local anesthetic was infused in all incisions and the incisions were closed with 4-0 Monocryl.  The wounds were cleaned and sealed with DermaBond.  The patient was emerged from  anesthesia and extubated and brought to the recovery room for further management.  The patient tolerated the procedure well and all counts were correct at the end of the  case.   Melvyn Neth, MD

## 2021-10-16 NOTE — Anesthesia Procedure Notes (Signed)
Procedure Name: Intubation Date/Time: 10/16/2021 11:19 AM Performed by: Chanetta Marshall, CRNA Pre-anesthesia Checklist: Patient identified, Emergency Drugs available, Suction available and Patient being monitored Patient Re-evaluated:Patient Re-evaluated prior to induction Oxygen Delivery Method: Circle system utilized Preoxygenation: Pre-oxygenation with 100% oxygen Induction Type: IV induction Ventilation: Mask ventilation without difficulty Laryngoscope Size: McGraph and 3 Tube type: Oral Tube size: 7.0 mm Number of attempts: 1 Airway Equipment and Method: Stylet and Video-laryngoscopy Placement Confirmation: ETT inserted through vocal cords under direct vision, positive ETCO2, breath sounds checked- equal and bilateral and CO2 detector Secured at: 21 cm Tube secured with: Tape Dental Injury: Teeth and Oropharynx as per pre-operative assessment

## 2021-10-16 NOTE — Interval H&P Note (Signed)
History and Physical Interval Note:  10/16/2021 10:33 AM  Mariah Rice  has presented today for surgery, with the diagnosis of small bowel intussusception.  The various methods of treatment have been discussed with the patient and family. After consideration of risks, benefits and other options for treatment, the patient has consented to  Procedure(s): XI ROBOT ASSISTED DIAGNOSTIC LAPAROSCOPY (N/A) XI ROBOTIC ASSISTED SMALL BOWEL RESECTION, possible (N/A) LYSIS OF ADHESION, possible with Edison Simon, PA-C assisting (N/A) as a surgical intervention.  The patient's history has been reviewed, patient examined, no change in status, stable for surgery.  I have reviewed the patient's chart and labs.  Questions were answered to the patient's satisfaction.     Dejion Grillo

## 2021-10-17 ENCOUNTER — Encounter: Payer: Self-pay | Admitting: Surgery

## 2021-10-17 NOTE — Anesthesia Postprocedure Evaluation (Signed)
Anesthesia Post Note  Patient: Mariah Rice  Procedure(s) Performed: XI ROBOT ASSISTED DIAGNOSTIC LAPAROSCOPY  Patient location during evaluation: PACU Anesthesia Type: General Level of consciousness: awake and alert Pain management: pain level controlled Vital Signs Assessment: post-procedure vital signs reviewed and stable Respiratory status: spontaneous breathing, nonlabored ventilation and respiratory function stable Cardiovascular status: blood pressure returned to baseline and stable Postop Assessment: no apparent nausea or vomiting Anesthetic complications: no   No notable events documented.   Last Vitals:  Vitals:   10/16/21 1530 10/16/21 1551  BP: (!) 158/84   Pulse: 69 65  Resp: 16 16  Temp: (!) 36.3 C (!) 36.3 C  SpO2: 97% 97%    Last Pain:  Vitals:   10/16/21 1551  TempSrc: Temporal  PainSc: [place]

## 2021-10-18 ENCOUNTER — Telehealth: Payer: Self-pay | Admitting: *Deleted

## 2021-10-18 NOTE — Telephone Encounter (Signed)
Diagnostic Laparoscopic 10/16/2021-called patient and she stated she is feeling much better. She stated she has not had a bowel movement since before surgery. We discussed trying Miralax. Be sure to drink fluids. She is sore. Denies fever or chills and taking Tylenol for the discomfort. Incision looks good per patient.

## 2021-10-18 NOTE — Telephone Encounter (Signed)
Patient called and she had surgery on 11/1/2 Dr Hampton Abbot xi robot assisted diagnostic laparoscopy and today she stated that her hands and feet has been ice cold. She has a fever of 99.3. She took tylenol today. Please call and advise.

## 2021-10-23 ENCOUNTER — Telehealth: Payer: Self-pay | Admitting: *Deleted

## 2021-10-23 NOTE — Telephone Encounter (Signed)
Faxed FMLA to [facility] [employer] at [phone]

## 2021-10-25 ENCOUNTER — Telehealth: Payer: Self-pay | Admitting: Surgery

## 2021-10-25 NOTE — Telephone Encounter (Signed)
Spoke with the patient and she had took a nap. She reports that she has had 3 good bowel movements with the Miralax. She is feeling better now but would like to have a refill of her oxycodone.  I will send a message to her doctor about this.

## 2021-10-25 NOTE — Telephone Encounter (Signed)
Call back made to the patient. No answer, left a message for her to call back.

## 2021-10-25 NOTE — Telephone Encounter (Signed)
Incoming call from patient.  She had diagnostic lap done on 10/16/21 with Dr. Hampton Abbot.  Patient states that she is starting to have severe abdominal pain.  Only has one of her Oxycodone left.  He is using the Miralax and also still having trouble with her bowel movements.  Wants to know what else she can do, also possible refill of her pain meds.  Please call.  Thank you.

## 2021-10-26 NOTE — Telephone Encounter (Signed)
Message left for the patient letting her know that Dr Hampton Abbot would not refill her Oxycodone. She may use Tylenol or Aleve and can use ice to the area to help with discomfort. She may call back with any questions.

## 2021-10-31 ENCOUNTER — Ambulatory Visit (INDEPENDENT_AMBULATORY_CARE_PROVIDER_SITE_OTHER): Payer: 59 | Admitting: Surgery

## 2021-10-31 ENCOUNTER — Other Ambulatory Visit: Payer: Self-pay

## 2021-10-31 ENCOUNTER — Encounter: Payer: Self-pay | Admitting: Surgery

## 2021-10-31 VITALS — BP 142/85 | HR 61 | Temp 98.1°F | Ht 64.0 in | Wt 214.0 lb

## 2021-10-31 DIAGNOSIS — R1010 Upper abdominal pain, unspecified: Secondary | ICD-10-CM

## 2021-10-31 DIAGNOSIS — Z09 Encounter for follow-up examination after completed treatment for conditions other than malignant neoplasm: Secondary | ICD-10-CM

## 2021-10-31 NOTE — Progress Notes (Signed)
10/31/2021  HPI: Mariah Rice is a 50 y.o. female s/p robotic assisted diagnostic laparoscopy for abdominal pain on 10/16/21.  She had initial suspicion for intussusception, but intraoperatively, her entire small bowel was run and there were no areas of abnormalities or lead points.  She reports that she continues to have some intra-abdominal pain and had requested a refill of her oxycodone.  She's had some constipation and is taking Miralax.  Vital signs: BP (!) 142/85   Pulse 61   Temp 98.1 F (36.7 C) (Oral)   Ht 5\' 4"  (1.626 m)   Wt 214 lb (97.1 kg)   LMP  (LMP Unknown) Comment: on Megace  SpO2 96%   BMI 36.73 kg/m    Physical Exam: Constitutional: No acute distress Abdomen:  soft, obese, non-distended, appropriately sore to palpation.  Incisions are clean, dry, intact, healing well.  Assessment/Plan: This is a 50 y.o. female s/p robotic assisted diagnostic laparoscopy.  --Discussed with the patient again the findings from her surgery, that there were no bowel abnormalities.  There was no evidence of intussusception or any bowel wall thickening or masses.   --Given that there was no intraop evidence of surgical pathology, would recommend that she follow up with her gastroenterologist for further evaluation.  Perhaps her pain is functional or dysmotility.   --Also given that there was no surgical etiology, and no bowel resection was done, do not think it would be appropriate to refill her oxycodone . --Follow up as needed.   Melvyn Neth, [facility]

## 2021-10-31 NOTE — Patient Instructions (Addendum)
Follow up with your Gastroenterologist Dr.Henry Danis for the constipation issues. Continue taking the Miralax and you can add Doculax if needed.    Constipation, Adult Constipation is when a person has fewer than three bowel movements in a week, has difficulty having a bowel movement, or has stools (feces) that are dry, hard, or larger than normal. Constipation may be caused by an underlying condition. It may become worse with age if a person takes certain medicines and does not take in enough fluids. Follow these instructions at home: Eating and drinking  Eat foods that have a lot of fiber, such as beans, whole grains, and fresh fruits and vegetables. Limit foods that are low in fiber and high in fat and processed sugars, such as fried or sweet foods. These include french fries, hamburgers, cookies, candies, and soda. Drink enough fluid to keep your urine pale yellow. General instructions Exercise regularly or as told by your health care provider. Try to do 150 minutes of moderate exercise each week. Use the bathroom when you have the urge to go. Do not hold it in. Take over-the-counter and prescription medicines only as told by your health care provider. This includes any fiber supplements. During bowel movements: Practice deep breathing while relaxing the lower abdomen. Practice pelvic floor relaxation. Watch your condition for any changes. Let your health care provider know about them. Keep all follow-up visits as told by your health care provider. This is important. Contact a health care provider if: You have pain that gets worse. You have a fever. You do not have a bowel movement after 4 days. You vomit. You are not hungry or you lose weight. You are bleeding from the opening between the buttocks (anus). You have thin, pencil-like stools. Get help right away if: You have a fever and your symptoms suddenly get worse. You leak stool or have blood in your stool. Your abdomen is  bloated. You have severe pain in your abdomen. You feel dizzy or you faint. Summary Constipation is when a person has fewer than three bowel movements in a week, has difficulty having a bowel movement, or has stools (feces) that are dry, hard, or larger than normal. Eat foods that have a lot of fiber, such as beans, whole grains, and fresh fruits and vegetables. Drink enough fluid to keep your urine pale yellow. Take over-the-counter and prescription medicines only as told by your health care provider. This includes any fiber supplements. This information is not intended to replace advice given to you by your health care provider. Make sure you discuss any questions you have with your health care provider. Document Revised: 10/20/2019 Document Reviewed: 10/20/2019 Elsevier Patient Education  2022 [place] PATIENT INSTRUCTIONS   WOUND CARE INSTRUCTIONS:  Keep a dry clean dressing on the wound if there is drainage. The initial bandage may be removed after 24 hours.  Once the wound has quit draining you may leave it open to air.  If clothing rubs against the wound or causes irritation and the wound is not draining you may cover it with a dry dressing during the daytime.  Try to keep the wound dry and avoid ointments on the wound unless directed to do so.  If the wound becomes bright red and painful or starts to drain infected material that is not clear, please contact your physician immediately.  If the wound is mildly pink and has a thick firm ridge underneath it, this is normal, and is referred to  as a healing ridge.  This will resolve over the next 4-6 weeks.  BATHING: You may shower if you have been informed of this by your surgeon. However, Please do not submerge in a tub, hot tub, or pool until incisions are completely sealed or have been told by your surgeon that you may do so.  DIET:  You may eat any foods that you can tolerate.  It is a good idea to eat a  high fiber diet and take in plenty of fluids to prevent constipation.  If you do become constipated you may want to take a mild laxative or take ducolax tablets on a daily basis until your bowel habits are regular.  Constipation can be very uncomfortable, along with straining, after recent surgery.  ACTIVITY:  You are encouraged to cough and deep breath or use your incentive spirometer if you were given one, every 15-30 minutes when awake.  This will help prevent respiratory complications and low grade fevers post-operatively if you had a general anesthetic.  You may want to hug a pillow when coughing and sneezing to add additional support to the surgical area, if you had abdominal or chest surgery, which will decrease pain during these times.  You are encouraged to walk and engage in light activity for the next two weeks.  You should not lift more than 20 pounds, until 11/27/2021 as it could put you at increased risk for complications.  Twenty pounds is roughly equivalent to a plastic bag of groceries. At that time- Listen to your body when lifting, if you have pain when lifting, stop and then try again in a few days. Soreness after doing exercises or activities of daily living is normal as you get back in to your normal routine.  MEDICATIONS:  Try to take narcotic medications and anti-inflammatory medications, such as tylenol, ibuprofen, naprosyn, etc., with food.  This will minimize stomach upset from the medication.  Should you develop nausea and vomiting from the pain medication, or develop a rash, please discontinue the medication and contact your physician.  You should not drive, make important decisions, or operate machinery when taking narcotic pain medication.  SUNBLOCK Use sun block to incision area over the next year if this area will be exposed to sun. This helps decrease scarring and will allow you avoid a permanent darkened area over your incision.  QUESTIONS:  Please feel free to call our  office if you have any questions, and we will be glad to assist you. [phone]

## 2021-11-05 ENCOUNTER — Telehealth: Payer: Self-pay | Admitting: Podiatry

## 2021-11-05 NOTE — Telephone Encounter (Signed)
Pt called requesting handicap placard for 6 months. She states she's only able to walk short distances. With your consent, I will get one filled out for her. Please advise.

## 2021-11-05 NOTE — Telephone Encounter (Signed)
fine

## 2021-11-22 ENCOUNTER — Other Ambulatory Visit: Payer: Self-pay | Admitting: Family Medicine

## 2021-11-23 ENCOUNTER — Encounter: Payer: Self-pay | Admitting: Family Medicine

## 2021-11-24 ENCOUNTER — Encounter: Payer: Self-pay | Admitting: Emergency Medicine

## 2021-11-24 ENCOUNTER — Other Ambulatory Visit: Payer: Self-pay

## 2021-11-24 ENCOUNTER — Ambulatory Visit
Admission: EM | Admit: 2021-11-24 | Discharge: 2021-11-24 | Disposition: A | Discharge status: Home / Self Care | Payer: 59 | Attending: Urgent Care | Admitting: Urgent Care

## 2021-11-24 DIAGNOSIS — M542 Cervicalgia: Secondary | ICD-10-CM | POA: Diagnosis not present

## 2021-11-24 DIAGNOSIS — M5412 Radiculopathy, cervical region: Secondary | ICD-10-CM | POA: Diagnosis not present

## 2021-11-24 MED ORDER — PREDNISONE 50 MG PO TABS: 50.0000 mg | ORAL_TABLET | Freq: Every day | ORAL | 0 refills | Status: DC

## 2021-11-24 MED ORDER — TIZANIDINE HCL 4 MG PO TABS: 4.0000 mg | ORAL_TABLET | Freq: Every day | ORAL | 0 refills | Status: DC

## 2021-11-24 NOTE — ED Triage Notes (Signed)
Right shoulder and neck pain x 1 week.  No known injury.  Has tried heating pad, pain patches, muscle relaxer  and ibuprofen without relief.

## 2021-11-24 NOTE — ED Provider Notes (Signed)
[place]   MRN: [id number] DOB: [birth year]  Subjective:   Mariah Rice is a 50 y.o. female presenting for 1 week history of persistent and worsening severe right-sided neck pain that radiates into the trapezius and down her right arm.  Holding her arm up and forward relieves some of the pain.  She has been using multiple pain medications, muscle relaxers without any relief.  No fall, trauma, history of neck disorders.  Patient has an appointment coming up with her PCP for the same.  No current facility-administered medications for this encounter.  Current Outpatient Medications:    acetaminophen (TYLENOL) 500 MG tablet, Take 2 tablets (1,000 mg total) by mouth every 6 (six) hours as needed for mild pain., Disp: , Rfl:    albuterol (VENTOLIN HFA) 108 (90 Base) MCG/ACT inhaler, Inhale 2 puffs into the lungs every 6 (six) hours as needed for wheezing or shortness of breath., Disp: , Rfl:    cetirizine (ZYRTEC) 10 MG tablet, Take 1 tablet (10 mg total) by mouth daily., Disp: 30 tablet, Rfl: 11   EPINEPHrine 0.3 mg/0.3 mL IJ SOAJ injection, Inject 0.3 mg into the muscle as needed for anaphylaxis (for difficulty breathing, throat, tongue or lip swelling. must come to ED after use.)., Disp: 1 each, Rfl: 0   estradiol (ESTRACE) 2 MG tablet, Take 1 tablet (2 mg total) by mouth daily., Disp: 90 tablet, Rfl: 3   fluticasone (FLONASE) 50 MCG/ACT nasal spray, USE 1 SPRAY IN EACH NOSTRIL ONCE DAILY FOR 14 DAYS (Patient taking differently: Place 1 spray into both nostrils at bedtime.), Disp: 48 mL, Rfl: 3   ibuprofen (ADVIL) 800 MG tablet, Take 800 mg by mouth every 8 (eight) hours as needed for moderate pain., Disp: , Rfl:    methocarbamol (ROBAXIN) 750 MG tablet, Take 1 tablet (750 mg total) by mouth 2 (two) times daily as needed for muscle spasms., Disp: 30 tablet, Rfl: 2   omeprazole (PRILOSEC) 20 MG capsule, Take 1 capsule (20 mg total) by mouth daily., Disp: 90 capsule, Rfl: 3    ondansetron (ZOFRAN) 4 MG tablet, Take 1 tablet (4 mg total) by mouth every 4 (four) hours as needed for nausea or vomiting., Disp: 30 tablet, Rfl: 0   oxcarbazepine (TRILEPTAL) 600 MG tablet, Take 1 tablet (600 mg total) by mouth 2 (two) times daily., Disp: 120 tablet, Rfl: 6   oxyCODONE (OXY IR/ROXICODONE) 5 MG immediate release tablet, Take 1 tablet (5 mg total) by mouth every 4 (four) hours as needed for severe pain., Disp: 30 tablet, Rfl: 0   polyethylene glycol powder (GLYCOLAX/MIRALAX) 17 GM/SCOOP powder, Take 17 g by mouth daily., Disp: 3350 g, Rfl: 1   pravastatin (PRAVACHOL) 40 MG tablet, Take 1 tablet (40 mg total) by mouth daily. (Patient taking differently: Take 40 mg by mouth at bedtime.), Disp: 90 tablet, Rfl: 3   propranolol ER (INDERAL LA) 60 MG 24 hr capsule, Take 1 capsule (60 mg total) by mouth daily., Disp: 90 capsule, Rfl: 3   SPIRIVA RESPIMAT 2.5 MCG/ACT AERS, Inhale 2 puffs into the lungs daily., Disp: 4 g, Rfl: 11   Allergies  Allergen Reactions   Amoxicillin Anaphylaxis   Azithromycin Anaphylaxis   Meloxicam Nausea And Vomiting   Sumatriptan Nausea Only, Rash and Other (See Comments)    Throat felt like it was closing up   Penicillins Nausea Only and Rash    Has patient had a PCN reaction causing immediate rash, facial/tongue/throat swelling, SOB or lightheadedness with  hypotension: no Has patient had a PCN reaction causing severe rash involving mucus membranes or skin necrosis:unknown Has patient had a PCN reaction that required hospitalization : yes Has patient had a PCN reaction occurring within the last 10 years: no If all of the above answers are "NO", then may proceed with Cephalosporin use.    Sulfa Antibiotics Nausea Only and Rash    Past Medical History:  Diagnosis Date   Abnormal finding on GI tract imaging    Anaphylaxis 07/26/2020   Anxiety    COPD (chronic obstructive pulmonary disease) (HCC)    COPD exacerbation ([place]) 07/17/2020   Dental abscess     Depression    Family history of breast cancer    Family history of breast cancer 01/26/2019   Family history of ovarian cancer    Genetic testing 03/08/2019   Negative genetic testing on the CancerNext-Expanded+RNAinsight testing.  The CancerNext-Expanded gene panel offered by [facility] and includes sequencing and rearrangement analysis for the following 67 genes: AIP, ALK, APC*, ATM*, BAP1, BARD1, BLM, BMPR1A, BRCA1*, BRCA2*, BRIP1*, CDH1*, CDK4, CDKN1B, CDKN2A, CHEK2*, DICER1, FANCC, FH, FLCN, GALNT12, HOXB13, MAX, MEN1, MET, MLH1*, MRE11A, MSH2*   GERD (gastroesophageal reflux disease)    Headache    migraines   Hypertension    Hyperthyroidism 1990s   PONV (postoperative nausea and vomiting)    Shortness of breath dyspnea    with anxiety   Small bowel obstruction (HCC)    Tick bite of abdomen 05/13/2017     Past Surgical History:  Procedure Laterality Date   DILATION AND CURETTAGE OF UTERUS  1999   ENTEROSCOPY N/A 05/12/2019   Procedure: ENTEROSCOPY;  Surgeon: Doran Stabler, MD;  Location: [facility];  Service: Gastroenterology;  Laterality: N/A;   FOOT SURGERY Right    INCISE AND DRAIN ABCESS  2005   LAPAROSCOPIC ASSISTED VAGINAL HYSTERECTOMY N/A 07/11/2015   Procedure: LAPAROSCOPIC ASSISTED VAGINAL Total HYSTERECTOMY;  Surgeon: Osborne Oman, MD;  Location: [facility];  Service: Gynecology;  Laterality: N/A;   LAPAROSCOPIC BILATERAL SALPINGECTOMY Bilateral 07/11/2015   Procedure: LAPAROSCOPIC BILATERAL SALPINGO OOPHORECTOMY ;  Surgeon: Osborne Oman, MD;  Location: [facility];  Service: Gynecology;  Laterality: Bilateral;   tubal ligation  2000   WISDOM TOOTH EXTRACTION     XI ROBOT ASSISTED DIAGNOSTIC LAPAROSCOPY N/A 10/16/2021   Procedure: XI ROBOT ASSISTED DIAGNOSTIC LAPAROSCOPY;  Surgeon: Olean Ree, MD;  Location: [facility];  Service: General;  Laterality: N/A;    Family History  Problem Relation Age of Onset   Ovarian cancer Mother 20       d. 71    Heart disease Father    Hypertension Father    COPD Father    Hypertension Paternal Aunt 13   Lung cancer Paternal Aunt 74   Breast cancer Paternal Aunt 66   Thyroid disease Maternal Grandmother    Lymphoma Maternal Grandmother 43       NHL, recurrance at 68   Lung cancer Maternal Grandmother 61   Hypertension Paternal Grandmother    Heart disease Paternal Grandmother    Heart Problems Paternal Grandmother    Dementia Paternal Grandmother    Colon cancer Neg Hx    Esophageal cancer Neg Hx    Stomach cancer Neg Hx    Pancreatic cancer Neg Hx     Social History   Tobacco Use   Smoking status: Every Day    Packs/day: 0.50    Years: 30.00    Pack years:  15.00    Types: Cigarettes    Start date: 12/16/1986   Smokeless tobacco: Never   Tobacco comments:    Smoked 1 ppd for 25 years  Vaping Use   Vaping Use: Never used  Substance Use Topics   Alcohol use: Not Currently    Alcohol/week: 0.0 standard drinks   Drug use: No    ROS   Objective:   Vitals: BP (!) 152/109 (BP Location: Right Arm)   Pulse 83   Temp 98.1 F (36.7 C) (Oral)   Resp 18   LMP  (LMP Unknown) Comment: on Megace  SpO2 97%   Physical Exam Constitutional:      General: She is not in acute distress.    Appearance: Normal appearance. She is well-developed. She is not ill-appearing.  HENT:     Head: Normocephalic and atraumatic.     Nose: Nose normal.     Mouth/Throat:     Mouth: Mucous membranes are moist.     Pharynx: Oropharynx is clear.  Eyes:     General: No scleral icterus.    Extraocular Movements: Extraocular movements intact.     Pupils: Pupils are equal, round, and reactive to light.  Cardiovascular:     Rate and Rhythm: Normal rate.  Pulmonary:     Effort: Pulmonary effort is normal.  Musculoskeletal:     Cervical back: Spasms and tenderness present. No swelling, edema, deformity, erythema, signs of trauma, lacerations, rigidity, torticollis, bony tenderness or crepitus. Pain with  movement present. Decreased range of motion.     Comments: Tenderness at the right base of her neck extending into the trapezius with associated significant muscle spasms of the trapezius.  Positive Spurling maneuver to the right.  Slight weakness compared to the left for the right arm possibly due to pain felt at the trapezius.  Skin:    General: Skin is warm and dry.  Neurological:     General: No focal deficit present.     Mental Status: She is alert and oriented to person, place, and time.  Psychiatric:        Mood and Affect: Mood normal.        Behavior: Behavior normal.      Assessment and Plan :   PDMP not reviewed this encounter.  1. Cervical radiculopathy   2. Neck pain    Discussed cervical radiculopathy with patient.  Recommended that she pursue an MRI with her PCP, has an appointment coming up on Tuesday.  In the meantime will use high-dose steroids including prednisone at 50 mg for 5 days.  Use Tylenol and tizanidine as well.  Counseled on back care, neck care.  No acute findings warranting emergent imaging and therefore will defer ER visit. Counseled patient on potential for adverse effects with medications prescribed/recommended today, ER and return-to-clinic precautions discussed, patient verbalized understanding.    Jaynee Eagles, [place] 11/24/21 [phone]

## 2021-11-27 ENCOUNTER — Ambulatory Visit: Payer: 59

## 2021-11-27 ENCOUNTER — Encounter: Payer: Self-pay | Admitting: Family Medicine

## 2021-11-27 ENCOUNTER — Other Ambulatory Visit: Payer: Self-pay

## 2021-11-27 ENCOUNTER — Ambulatory Visit (INDEPENDENT_AMBULATORY_CARE_PROVIDER_SITE_OTHER): Payer: 59 | Admitting: Family Medicine

## 2021-11-27 VITALS — BP 136/82 | HR 67 | Ht 64.0 in | Wt 213.2 lb

## 2021-11-27 DIAGNOSIS — M542 Cervicalgia: Secondary | ICD-10-CM | POA: Diagnosis not present

## 2021-11-27 DIAGNOSIS — Z23 Encounter for immunization: Secondary | ICD-10-CM

## 2021-11-27 NOTE — Patient Instructions (Signed)
I have ordered xrays to [facility], please go there at your convenience to get these.  I have ordered an MRI, I will send a message to our referral specialist to start the process with insurance. We will need the neck xrays first.   Let me know if your symptoms worsen before the mri occurs.  Once her results we will talk about neck steps based off what it shows.

## 2021-11-27 NOTE — Assessment & Plan Note (Signed)
Patient with recent ED visit due to neck pain and concern for cervical radiculopathy.  She had noted positive Spurling maneuver and was recommended to pursue an MRI.  She presents today for the same.  Physical exam today shows positive Spurling's test today as well as weakness with grip, elbow flexion, elbow extension on the right when compared to the left.  She also has some pain that radiates down her right arm to her fingertips.  She has some pain with palpation of the trapezius musculature.  Due to the weakness on the right side this is quite concerning for a cervical etiology.  She is indeed needed an MRI.  We will initiate the work-up with cervical x-rays and we will go ahead and order the cervical MRI. We will order the cervical MRI.  No findings today necessitating emergent surgical intervention.  We will follow-up with her once the MRI is read to determine next steps.  She is in agreement with this plan.

## 2021-11-27 NOTE — Progress Notes (Signed)
° ° °  SUBJECTIVE:   CHIEF COMPLAINT / HPI:   Arm/neck pain: Has been going on for about 2 weeks. Initially thought she slept on it "funny". She tried heat and ice packs but nothing improved it. It started on the right side radiating down neck and down into arm. Had urgent care visit on 11/24/2021 for cervical radiculopathy.  It is noted that she had tenderness at the base of the right side of the neck consistent with trapezius muscle spasm and a positive Spurling maneuver on the right during testing.  It was recommended that she pursue an MRI after this appointment due to these findings.  She was given prednisone 50 mg for 5 days and recommended to use the Tylenol and tizanidine as well. She states today that the muscle relaxer didn't help much. It still hurts.   PERTINENT  PMH / PSH: None  OBJECTIVE:   BP 136/82    Pulse 67    Ht 5\' 4"  (1.626 m)    Wt 213 lb 3.2 oz (96.7 kg)    LMP  (LMP Unknown) Comment: on Megace   SpO2 99%    BMI 36.60 kg/m    General: NAD, pleasant, able to participate in exam Cardiac: RRR, no murmurs. Respiratory: CTAB, normal effort, No wheezes, rales or rhonchi MSK: Strength 5/5 on the left with grip, elbow flexion, elbow extension, 4/5 on the right with grip, elbow flexion, elbow extension.  Normal sensation.  She has a positive Spurling's test when looking towards the right.  Some pain with palpation of the trapezius on the right, no pain with palpation of the trapezius on the left. Psych: Normal affect and mood  ASSESSMENT/PLAN:   Neck pain Patient with recent ED visit due to neck pain and concern for cervical radiculopathy.  She had noted positive Spurling maneuver and was recommended to pursue an MRI.  She presents today for the same.  Physical exam today shows positive Spurling's test today as well as weakness with grip, elbow flexion, elbow extension on the right when compared to the left.  She also has some pain that radiates down her right arm to her  fingertips.  She has some pain with palpation of the trapezius musculature.  Due to the weakness on the right side this is quite concerning for a cervical etiology.  She is indeed needed an MRI.  We will initiate the work-up with cervical x-rays and we will go ahead and order the cervical MRI. We will order the cervical MRI.  No findings today necessitating emergent surgical intervention.  We will follow-up with her once the MRI is read to determine next steps.  She is in agreement with this plan.   Lurline Del, [place]

## 2021-12-03 ENCOUNTER — Other Ambulatory Visit: Payer: Self-pay | Admitting: Family Medicine

## 2021-12-03 ENCOUNTER — Other Ambulatory Visit: Payer: Self-pay

## 2021-12-03 ENCOUNTER — Encounter: Payer: Self-pay | Admitting: Family Medicine

## 2021-12-03 ENCOUNTER — Ambulatory Visit (HOSPITAL_COMMUNITY)
Admission: RE | Admit: 2021-12-03 | Discharge: 2021-12-03 | Disposition: A | Discharge status: Home / Self Care | Payer: 59 | Source: Ambulatory Visit | Attending: Family Medicine | Admitting: Family Medicine

## 2021-12-03 DIAGNOSIS — M542 Cervicalgia: Secondary | ICD-10-CM | POA: Insufficient documentation

## 2021-12-03 MED ORDER — GABAPENTIN 100 MG PO CAPS: 100.0000 mg | ORAL_CAPSULE | Freq: Three times a day (TID) | ORAL | 0 refills | Status: DC

## 2021-12-04 ENCOUNTER — Encounter: Payer: Self-pay | Admitting: Family Medicine

## 2021-12-04 NOTE — Progress Notes (Signed)
Spoke with patient. Informed of MRI on Jan 11th at 10:30am at [facility]. Patient understood. Salvatore Marvel, CMA

## 2021-12-17 ENCOUNTER — Encounter: Payer: Self-pay | Admitting: Family Medicine

## 2021-12-18 ENCOUNTER — Other Ambulatory Visit: Payer: Self-pay

## 2021-12-18 ENCOUNTER — Encounter: Payer: Self-pay | Admitting: Family Medicine

## 2021-12-18 ENCOUNTER — Ambulatory Visit (INDEPENDENT_AMBULATORY_CARE_PROVIDER_SITE_OTHER): Payer: 59 | Admitting: Gastroenterology

## 2021-12-18 ENCOUNTER — Other Ambulatory Visit: Payer: Self-pay | Admitting: Family Medicine

## 2021-12-18 ENCOUNTER — Encounter: Payer: Self-pay | Admitting: Gastroenterology

## 2021-12-18 VITALS — BP 116/76 | HR 72 | Ht 64.0 in | Wt 216.5 lb

## 2021-12-18 DIAGNOSIS — R1084 Generalized abdominal pain: Secondary | ICD-10-CM

## 2021-12-18 DIAGNOSIS — R112 Nausea with vomiting, unspecified: Secondary | ICD-10-CM | POA: Diagnosis not present

## 2021-12-18 DIAGNOSIS — K561 Intussusception: Secondary | ICD-10-CM | POA: Diagnosis not present

## 2021-12-18 MED ORDER — PRAVASTATIN SODIUM 40 MG PO TABS: 40.0000 mg | ORAL_TABLET | Freq: Every day | ORAL | 3 refills | Status: DC

## 2021-12-18 MED ORDER — TRAMADOL HCL 50 MG PO TABS: 50.0000 mg | ORAL_TABLET | Freq: Four times a day (QID) | ORAL | 0 refills | Status: AC | PRN

## 2021-12-18 NOTE — Progress Notes (Signed)
[facility] Progress Note  Chief Complaint: Generalized abdominal pain  Subjective  History: Mariah Rice is here for follow-up with ongoing abdominal pain distention and nausea after recent surgery.  I saw her in the office August 9 with ongoing symptoms for years continuing since a 2020 work-up showing possible jejunal intussusception.  Endoscopic work-up at that time unrevealing.  She was to have surgery with Dr. Rosendo Gros in 2020 then lost insurance.  She was referred again after repeat CT scan when I saw her months ago, but then discovered Dr. Rosendo Gros was out of insurance network.  She saw Dr. Hampton Abbot of general surgery at [facility] and underwent robotic assisted laparoscopy 10/16/2021 with a reportedly normal appearing small bowel.  No visible abnormalities, no resection performed.  Mariah Rice still has episodic generalized abdominal pain usually toward the middle with associated nausea and occasional vomiting.  She has occasional diarrhea as well, no rectal bleeding.  She has lately been bothered more so by neck and shoulder pain and is trying to get some assistance from primary care, and has an upcoming MRI scheduled. She has steady weight gain, which she finds frustrating since she does not feel that she eats very much. Her surgeon was prescribing oxycodone before and after the surgery, which she says helps with the abdominal pain.  Last prescription was in November. ROS: Cardiovascular:  no chest pain Respiratory: no dyspnea Mariah Rice has lately had severe neck pain and saw primary care and then an ED visit for that.  Portal message from her yesterday to PCP indicates this pain is worsening and making her miserable and the medicines prescribed by primary care have not been helping. Remainder systems negative except as above The patient's Past Medical, Family and Social History were reviewed and are on file in the EMR.  Objective:  Med list reviewed  Current Outpatient Medications:     acetaminophen (TYLENOL) 500 MG tablet, Take 2 tablets (1,000 mg total) by mouth every 6 (six) hours as needed for mild pain., Disp: , Rfl:    albuterol (VENTOLIN HFA) 108 (90 Base) MCG/ACT inhaler, Inhale 2 puffs into the lungs every 6 (six) hours as needed for wheezing or shortness of breath., Disp: , Rfl:    cetirizine (ZYRTEC) 10 MG tablet, Take 1 tablet (10 mg total) by mouth daily., Disp: 30 tablet, Rfl: 11   EPINEPHrine 0.3 mg/0.3 mL IJ SOAJ injection, Inject 0.3 mg into the muscle as needed for anaphylaxis (for difficulty breathing, throat, tongue or lip swelling. must come to ED after use.)., Disp: 1 each, Rfl: 0   estradiol (ESTRACE) 2 MG tablet, Take 1 tablet (2 mg total) by mouth daily., Disp: 90 tablet, Rfl: 3   fluticasone (FLONASE) 50 MCG/ACT nasal spray, USE 1 SPRAY IN EACH NOSTRIL ONCE DAILY FOR 14 DAYS (Patient taking differently: Place 1 spray into both nostrils at bedtime.), Disp: 48 mL, Rfl: 3   gabapentin (NEURONTIN) 100 MG capsule, Take 1 capsule (100 mg total) by mouth 3 (three) times daily., Disp: 90 capsule, Rfl: 0   ibuprofen (ADVIL) 800 MG tablet, Take 800 mg by mouth every 8 (eight) hours as needed for moderate pain., Disp: , Rfl:    methocarbamol (ROBAXIN) 750 MG tablet, Take 1 tablet (750 mg total) by mouth 2 (two) times daily as needed for muscle spasms., Disp: 30 tablet, Rfl: 2   omeprazole (PRILOSEC) 20 MG capsule, Take 1 capsule (20 mg total) by mouth daily., Disp: 90 capsule, Rfl: 3   ondansetron (ZOFRAN) 4  MG tablet, Take 1 tablet (4 mg total) by mouth every 4 (four) hours as needed for nausea or vomiting., Disp: 30 tablet, Rfl: 0   oxcarbazepine (TRILEPTAL) 600 MG tablet, Take 1 tablet (600 mg total) by mouth 2 (two) times daily., Disp: 120 tablet, Rfl: 6   oxyCODONE (OXY IR/ROXICODONE) 5 MG immediate release tablet, Take 1 tablet (5 mg total) by mouth every 4 (four) hours as needed for severe pain., Disp: 30 tablet, Rfl: 0   polyethylene glycol powder  (GLYCOLAX/MIRALAX) 17 GM/SCOOP powder, Take 17 g by mouth daily., Disp: 3350 g, Rfl: 1   pravastatin (PRAVACHOL) 40 MG tablet, Take 1 tablet (40 mg total) by mouth daily., Disp: 90 tablet, Rfl: 3   predniSONE (DELTASONE) 50 MG tablet, Take 1 tablet (50 mg total) by mouth daily with breakfast., Disp: 5 tablet, Rfl: 0   propranolol ER (INDERAL LA) 60 MG 24 hr capsule, Take 1 capsule (60 mg total) by mouth daily., Disp: 90 capsule, Rfl: 3   SPIRIVA RESPIMAT 2.5 MCG/ACT AERS, Inhale 2 puffs into the lungs daily., Disp: 4 g, Rfl: 11   Vital signs in last 24 hrs: Vitals:   12/18/21 1359  BP: 116/76  Pulse: 72  SpO2: 98%   Wt Readings from Last 3 Encounters:  12/18/21 216 lb 8 oz (98.2 kg)  11/27/21 213 lb 3.2 oz (96.7 kg)  10/31/21 214 lb (97.1 kg)    Physical Exam  Not acutely ill-appearing.  She is in pain from her neck/shoulder problem HEENT: sclera anicteric, oral mucosa moist without lesions Neck: supple, no thyromegaly, JVD or lymphadenopathy Cardiac: RRR without murmurs, S1S2 heard, no peripheral edema Pulm: clear to auscultation bilaterally, normal RR and effort noted Abdomen: soft, periumbilical tenderness to deep palpation, with active bowel sounds. No guarding or palpable hepatosplenomegaly. Skin; warm and dry, no jaundice or rash  Labs:  CBC Latest Ref Rng & Units 10/11/2021 07/24/2021 07/20/2020  WBC 3.4 - 10.8 x10E3/uL 5.8 8.0 11.5(H)  Hemoglobin 11.1 - 15.9 g/dL 15.2 14.4 13.7  Hematocrit 34.0 - 46.6 % 42.9 42.3 41.0  Platelets 150 - 450 x10E3/uL 265 270.0 284   CMP Latest Ref Rng & Units 10/11/2021 08/27/2021 07/24/2021  Glucose 70 - 99 mg/dL 90 - 96  BUN 6 - 24 mg/dL 14 - 8  Creatinine 0.57 - 1.00 mg/dL 0.84 0.70 0.73  Sodium 134 - 144 mmol/L 131(L) - 125(L)  Potassium 3.5 - 5.2 mmol/L 5.1 - 3.8  Chloride 96 - 106 mmol/L 98 - 95(L)  CO2 20 - 29 mmol/L 22 - 23  Calcium 8.7 - 10.2 mg/dL 9.1 - 8.8  Total Protein 6.0 - 8.3 g/dL - - 6.9  Total Bilirubin 0.2 - 1.2 mg/dL -  - 0.3  Alkaline Phos 39 - 117 U/L - - 83  AST 0 - 37 U/L - - 13  ALT 0 - 35 U/L - - 16    ___________________________________________ Radiologic studies: CLINICAL DATA:  Generalized abdominal pain and nausea/vomiting for several months. Previous small-bowel obstruction.   EXAM: CT ABDOMEN AND PELVIS WITH CONTRAST   TECHNIQUE: Multidetector CT imaging of the abdomen and pelvis was performed using the standard protocol following bolus administration of intravenous contrast.   CONTRAST:  65mL OMNIPAQUE IOHEXOL 350 MG/ML SOLN   COMPARISON:  04/23/2019 from [place]: Lower Chest: No acute findings.   Hepatobiliary: No hepatic masses identified. Gallbladder is unremarkable. No evidence of biliary ductal dilatation.   Pancreas:  No mass or inflammatory  changes.   Spleen: Within normal limits in size and appearance.   Adrenals/Urinary Tract: No masses identified. No evidence of ureteral calculi or hydronephrosis.   Stomach/Bowel: 2 adjacent small bowel intussusceptions are seen in the proximal jejunum in the left upper quadrant. Abnormal jejunal wall thickening is seen in this region, and this could be due to enteritis although a lead mass cannot be excluded. No other areas of small-bowel wall thickening are seen. No evidence of bowel obstruction. Normal appendix visualized.   Vascular/Lymphatic: No pathologically enlarged lymph nodes. No acute vascular findings. Aortic atherosclerotic calcification noted.   Reproductive: Prior hysterectomy noted. Adnexal regions are unremarkable in appearance.a   Other:  None.   Musculoskeletal:  No suspicious bone lesions identified.   IMPRESSION: 2 adjacent small bowel intussusceptions in the proximal jejunum. Abnormal jejunal wall thickening in this region could be due to enteritis, although a lead mass cannot be excluded. Consider further evaluation with capsule endoscopy.   No other sites of bowel disease  identified. No evidence of bowel obstruction.   Aortic Atherosclerosis (ICD10-I70.0).     Electronically Signed   By: Marlaine Hind M.D.   On: 08/27/2021 16:03   ____________________________________________ Other:   _____________________________________________ Assessment & Plan  Assessment: Encounter Diagnoses  Name Primary?   Generalized abdominal pain Yes   Small bowel intussusception (HCC)    Nausea and vomiting in adult    Ongoing abdominal pain with nausea that is episodic and she has a clearly abnormal CT scan suggesting wall thickening and intussusception in the jejunum.  I am very surprised by the abnormal findings on laparoscopy, though perhaps she may have a severe mucosal disease or neoplasia.  Still, if those were present to the extent they would cause the CT findings this year and in 2020, I would have expected visible abnormalities from the serosal surface.  At this point, I think we need to proceed with small bowel video capsule study.  She understands there is a risk of capsule retention, especially if there is an underlying stricture or inflammatory problem.  Nevertheless, if that were to occur, and if it did not pass on its own, it would likely be be a result of having diagnosed the focus of this problem.  She also understands that a retained capsule may require repeat surgery. All that was explained in detail and she agreed to proceed.  In addition, I have declined to prescribe further opioids because of the risk of dependency when used for chronic abdominal pain.  32 minutes were spent on this encounter (including chart review, history/exam, counseling/coordination of care, and documentation) > 50% of that time was spent on counseling and coordination of care.   Nelida Meuse III

## 2021-12-18 NOTE — Progress Notes (Addendum)
Patient reached out to me stating she was still experiencing significant pain from her neck.  I have seen her twice for this pain and we are awaiting an MRI which is scheduled in a week.  We recently started gabapentin and I had discussed with her as well as with the attending Dr. Erin Hearing about the possibility of starting additional medication if this is not helpful.  She states that she has given it a legitimate try but is not improving her symptoms.  I discussed with her that we can try tramadol but that this would not be a long-term medication and I would not be refilling it on a long-term basis.  She is understanding of this and understands that even if it helps her pain we would not continue it on a regular basis.  My goal is to provide this medication in order to get her through the MRI and under the care of a specialist based off what the MRI shows.  Will prescribe 30 count of tramadol 50. At our last appointment I discussed this possibility with attending Dr. Erin Hearing who was in agreement that a trial of Tramadol would not be unreasonable given her presentation. PDMP reviewed.

## 2021-12-18 NOTE — Patient Instructions (Signed)
If you are age 49 or older, your body mass index should be between 23-30. Your Body mass index is 37.16 kg/m. If this is out of the aforementioned range listed, please consider follow up with your Primary Care Provider.  If you are age 21 or younger, your body mass index should be between 19-25. Your Body mass index is 37.16 kg/m. If this is out of the aformentioned range listed, please consider follow up with your Primary Care Provider.   ________________________________________________________  The  GI providers would like to encourage you to use [facility] to communicate with providers for non-urgent requests or questions.  Due to long hold times on the telephone, sending your provider a message by [facility] may be a faster and more efficient way to get a response.  Please allow 48 business hours for a response.  Please remember that this is for non-urgent requests.  _______________________________________________________  It was a pleasure to see you today!  Thank you for trusting me with your gastrointestinal care!

## 2021-12-21 ENCOUNTER — Telehealth: Payer: Self-pay | Admitting: Gastroenterology

## 2021-12-21 ENCOUNTER — Other Ambulatory Visit: Payer: Self-pay

## 2021-12-21 ENCOUNTER — Ambulatory Visit
Admission: EM | Admit: 2021-12-21 | Discharge: 2021-12-21 | Disposition: A | Discharge status: Home / Self Care | Payer: 59 | Attending: Urgent Care | Admitting: Urgent Care

## 2021-12-21 DIAGNOSIS — R1084 Generalized abdominal pain: Secondary | ICD-10-CM

## 2021-12-21 DIAGNOSIS — M542 Cervicalgia: Secondary | ICD-10-CM | POA: Diagnosis not present

## 2021-12-21 DIAGNOSIS — G8929 Other chronic pain: Secondary | ICD-10-CM | POA: Diagnosis not present

## 2021-12-21 DIAGNOSIS — R112 Nausea with vomiting, unspecified: Secondary | ICD-10-CM

## 2021-12-21 DIAGNOSIS — M47812 Spondylosis without myelopathy or radiculopathy, cervical region: Secondary | ICD-10-CM

## 2021-12-21 DIAGNOSIS — K561 Intussusception: Secondary | ICD-10-CM

## 2021-12-21 DIAGNOSIS — R194 Change in bowel habit: Secondary | ICD-10-CM

## 2021-12-21 MED ORDER — PREDNISONE 20 MG PO TABS
ORAL_TABLET | ORAL | 0 refills | Status: DC
Start: 2021-12-21 — End: 2022-02-05

## 2021-12-21 MED ORDER — HYDROXYZINE HCL 50 MG PO TABS: 50.0000 mg | ORAL_TABLET | Freq: Every evening | ORAL | 0 refills | Status: DC | PRN

## 2021-12-21 NOTE — ED Provider Notes (Signed)
[facility]   MRN: [id number] DOB: November 02, 1971  Subjective:   Mariah Rice is a 51 y.o. female presenting for 1.5 month history of ongoing severe neck pain.  Patient was last seen on 12/03/2021.  Cervical imaging showed C4-5 and C5-6 spondylosis with right neuroforaminal encroachment.  Patient does have an MRI of the cervical spine scheduled 12/26/2021.  She has seen a specialist and was prescribed tramadol and gabapentin which is not helping.  She is also tried multiple muscle relaxants including tizanidine, Flexeril, methocarbamol.  Patient would like medication to help her sleep as she has had significant pain of the neck that prevents her from sleeping well.  No current facility-administered medications for this encounter.  Current Outpatient Medications:    acetaminophen (TYLENOL) 500 MG tablet, Take 2 tablets (1,000 mg total) by mouth every 6 (six) hours as needed for mild pain., Disp: , Rfl:    albuterol (VENTOLIN HFA) 108 (90 Base) MCG/ACT inhaler, Inhale 2 puffs into the lungs every 6 (six) hours as needed for wheezing or shortness of breath., Disp: , Rfl:    cetirizine (ZYRTEC) 10 MG tablet, Take 1 tablet (10 mg total) by mouth daily., Disp: 30 tablet, Rfl: 11   EPINEPHrine 0.3 mg/0.3 mL IJ SOAJ injection, Inject 0.3 mg into the muscle as needed for anaphylaxis (for difficulty breathing, throat, tongue or lip swelling. must come to ED after use.)., Disp: 1 each, Rfl: 0   estradiol (ESTRACE) 2 MG tablet, Take 1 tablet (2 mg total) by mouth daily., Disp: 90 tablet, Rfl: 3   fluticasone (FLONASE) 50 MCG/ACT nasal spray, USE 1 SPRAY IN EACH NOSTRIL ONCE DAILY FOR 14 DAYS (Patient taking differently: Place 1 spray into both nostrils at bedtime.), Disp: 48 mL, Rfl: 3   gabapentin (NEURONTIN) 100 MG capsule, Take 1 capsule (100 mg total) by mouth 3 (three) times daily., Disp: 90 capsule, Rfl: 0   ibuprofen (ADVIL) 800 MG tablet, Take 800 mg by mouth every 8 (eight) hours as  needed for moderate pain., Disp: , Rfl:    methocarbamol (ROBAXIN) 750 MG tablet, Take 1 tablet (750 mg total) by mouth 2 (two) times daily as needed for muscle spasms., Disp: 30 tablet, Rfl: 2   omeprazole (PRILOSEC) 20 MG capsule, Take 1 capsule (20 mg total) by mouth daily., Disp: 90 capsule, Rfl: 3   ondansetron (ZOFRAN) 4 MG tablet, Take 1 tablet (4 mg total) by mouth every 4 (four) hours as needed for nausea or vomiting., Disp: 30 tablet, Rfl: 0   oxcarbazepine (TRILEPTAL) 600 MG tablet, Take 1 tablet (600 mg total) by mouth 2 (two) times daily., Disp: 120 tablet, Rfl: 6   polyethylene glycol powder (GLYCOLAX/MIRALAX) 17 GM/SCOOP powder, Take 17 g by mouth daily., Disp: 3350 g, Rfl: 1   pravastatin (PRAVACHOL) 40 MG tablet, Take 1 tablet (40 mg total) by mouth daily., Disp: 90 tablet, Rfl: 3   predniSONE (DELTASONE) 50 MG tablet, Take 1 tablet (50 mg total) by mouth daily with breakfast., Disp: 5 tablet, Rfl: 0   propranolol ER (INDERAL LA) 60 MG 24 hr capsule, Take 1 capsule (60 mg total) by mouth daily., Disp: 90 capsule, Rfl: 3   SPIRIVA RESPIMAT 2.5 MCG/ACT AERS, Inhale 2 puffs into the lungs daily., Disp: 4 g, Rfl: 11   traMADol (ULTRAM) 50 MG tablet, Take 1 tablet (50 mg total) by mouth every 6 (six) hours as needed for up to 10 days., Disp: 30 tablet, Rfl: 0   Allergies  Allergen  Reactions   Amoxicillin Anaphylaxis   Azithromycin Anaphylaxis   Meloxicam Nausea And Vomiting   Sumatriptan Nausea Only, Rash and Other (See Comments)    Throat felt like it was closing up   Penicillins Nausea Only and Rash    Has patient had a PCN reaction causing immediate rash, facial/tongue/throat swelling, SOB or lightheadedness with hypotension: no Has patient had a PCN reaction causing severe rash involving mucus membranes or skin necrosis:unknown Has patient had a PCN reaction that required hospitalization : yes Has patient had a PCN reaction occurring within the last 10 years: no If all of the  above answers are "NO", then may proceed with Cephalosporin use.    Sulfa Antibiotics Nausea Only and Rash    Past Medical History:  Diagnosis Date   Abnormal finding on GI tract imaging    Anaphylaxis 07/26/2020   Anxiety    COPD (chronic obstructive pulmonary disease) (HCC)    COPD exacerbation ([place]) 07/17/2020   Dental abscess    Depression    Family history of breast cancer    Family history of breast cancer 01/26/2019   Family history of ovarian cancer    Genetic testing 03/08/2019   Negative genetic testing on the CancerNext-Expanded+RNAinsight testing.  The CancerNext-Expanded gene panel offered by [facility] and includes sequencing and rearrangement analysis for the following 67 genes: AIP, ALK, APC*, ATM*, BAP1, BARD1, BLM, BMPR1A, BRCA1*, BRCA2*, BRIP1*, CDH1*, CDK4, CDKN1B, CDKN2A, CHEK2*, DICER1, FANCC, FH, FLCN, GALNT12, HOXB13, MAX, MEN1, MET, MLH1*, MRE11A, MSH2*   GERD (gastroesophageal reflux disease)    Headache    migraines   Hypertension    Hyperthyroidism 1990s   PONV (postoperative nausea and vomiting)    Shortness of breath dyspnea    with anxiety   Small bowel obstruction (HCC)    Tick bite of abdomen 05/13/2017     Past Surgical History:  Procedure Laterality Date   DILATION AND CURETTAGE OF UTERUS  1999   ENTEROSCOPY N/A 05/12/2019   Procedure: ENTEROSCOPY;  Surgeon: Doran Stabler, MD;  Location: [facility];  Service: Gastroenterology;  Laterality: N/A;   FOOT SURGERY Right    INCISE AND DRAIN ABCESS  2005   LAPAROSCOPIC ASSISTED VAGINAL HYSTERECTOMY N/A 07/11/2015   Procedure: LAPAROSCOPIC ASSISTED VAGINAL Total HYSTERECTOMY;  Surgeon: Osborne Oman, MD;  Location: [facility];  Service: Gynecology;  Laterality: N/A;   LAPAROSCOPIC BILATERAL SALPINGECTOMY Bilateral 07/11/2015   Procedure: LAPAROSCOPIC BILATERAL SALPINGO OOPHORECTOMY ;  Surgeon: Osborne Oman, MD;  Location: [facility];  Service: Gynecology;  Laterality: Bilateral;   tubal  ligation  2000   WISDOM TOOTH EXTRACTION     XI ROBOT ASSISTED DIAGNOSTIC LAPAROSCOPY N/A 10/16/2021   Procedure: XI ROBOT ASSISTED DIAGNOSTIC LAPAROSCOPY;  Surgeon: Olean Ree, MD;  Location: [facility];  Service: General;  Laterality: N/A;    Family History  Problem Relation Age of Onset   Ovarian cancer Mother 19       d. 83   Heart disease Father    Hypertension Father    COPD Father    Hypertension Paternal Aunt 60   Lung cancer Paternal Aunt 74   Breast cancer Paternal Aunt 27   Thyroid disease Maternal Grandmother    Lymphoma Maternal Grandmother 46       NHL, recurrance at 70   Lung cancer Maternal Grandmother 61   Hypertension Paternal Grandmother    Heart disease Paternal Grandmother    Heart Problems Paternal Grandmother    Dementia Paternal  Grandmother    Colon cancer Neg Hx    Esophageal cancer Neg Hx    Stomach cancer Neg Hx    Pancreatic cancer Neg Hx     Social History   Tobacco Use   Smoking status: Every Day    Packs/day: 0.50    Years: 30.00    Pack years: 15.00    Types: Cigarettes    Start date: 12/16/1986   Smokeless tobacco: Never   Tobacco comments:    Smoked 1 ppd for 25 years  Vaping Use   Vaping Use: Never used  Substance Use Topics   Alcohol use: Not Currently    Alcohol/week: 0.0 standard drinks   Drug use: No    ROS   Objective:   Vitals: BP (!) 141/83 (BP Location: Left Arm)    Pulse 69    Temp 97.9 F (36.6 C) (Oral)    Resp 18    LMP  (LMP Unknown) Comment: on [place]   SpO2 95%   Physical Exam Constitutional:      General: She is not in acute distress.    Appearance: Normal appearance. She is well-developed. She is not ill-appearing, toxic-appearing or diaphoretic.  HENT:     Head: Normocephalic and atraumatic.     Nose: Nose normal.     Mouth/Throat:     Mouth: Mucous membranes are moist.     Pharynx: Oropharynx is clear.  Eyes:     General: No scleral icterus.       Right eye: No discharge.        Left eye: No  discharge.     Extraocular Movements: Extraocular movements intact.     Conjunctiva/sclera: Conjunctivae normal.     Pupils: Pupils are equal, round, and reactive to light.  Cardiovascular:     Rate and Rhythm: Normal rate.  Pulmonary:     Effort: Pulmonary effort is normal.  Musculoskeletal:     Cervical back: Spasms and tenderness (positive Spurling manuever to the right) present. No swelling, edema, deformity, erythema, signs of trauma, lacerations, rigidity, torticollis, bony tenderness or crepitus. Pain with movement present. Decreased range of motion.  Skin:    General: Skin is warm and dry.  Neurological:     General: No focal deficit present.     Mental Status: She is alert and oriented to person, place, and time.  Psychiatric:        Mood and Affect: Mood normal.        Behavior: Behavior normal.        Thought Content: Thought content normal.        Judgment: Judgment normal.    DG Cervical Spine Complete  Result Date: 12/03/2021 CLINICAL DATA:  Right arm pain and weakness for 3 weeks EXAM: CERVICAL SPINE - COMPLETE 4+ VIEW COMPARISON:  None. FINDINGS: Frontal, bilateral oblique, lateral views of the cervical spine are obtained. Alignment is anatomic to the cervicothoracic junction. There is mild spondylosis at C4-5 and C5-6, with mild right neural foraminal narrowing at those levels. Remaining neural foramina are patent. No acute fractures. Prevertebral soft tissues are unremarkable. Lung apices are clear. IMPRESSION: 1. C4-5 and C5-6 spondylosis, with mild right neural foraminal encroachment. Electronically Signed   By: Randa Ngo M.D.   On: 12/03/2021 16:00     Assessment and Plan :   PDMP not reviewed this encounter.  1. Cervical spondylosis without myelopathy   2. Chronic neck pain    Recommended a higher dose steroid course than what  we did last time.  This will be a 9-day course starting at 60 mg for 3 days then decreasing every 3 days.  Recommended continuing  with follow-up with her specialist, completing the MRI.  We will trial hydroxyzine to help her sleep at night.  Deferred repeat imaging given lack of trauma since her last visit.  Counseled patient on potential for adverse effects with medications prescribed/recommended today, ER and return-to-clinic precautions discussed, patient verbalized understanding.    Jaynee Eagles, PA-C 12/21/21 1242

## 2021-12-21 NOTE — Telephone Encounter (Signed)
Lm on vm for patient to return call.   She has been scheduled for a single balloon enteroscopy at [facility] on Thursday, 02/07/22 at 10:45 am. Patient will need to arrive at [facility] by 9:15 am with a care partner.

## 2021-12-21 NOTE — Telephone Encounter (Signed)
Pt returned call. She states that her CT enterography is scheduled for 01/01/22 at 8 am. We have reviewed her enteroscopy appt at [facility]. Pt is aware that I will send her instructions via my chart and I will place a copy in the mail for her. Pt verbalized understanding and had no concerns at the end of the call.   Ambulatory referral to GI in epic.

## 2021-12-21 NOTE — Telephone Encounter (Signed)
Called and spoke with patient. We reviewed the information below and Dr. Loletha Carrow' recommendations. Pt is aware that we are cancelling the VCE. She knows that she will need to go by the lab in the basement of our office at her convenience to have labs drawn. We have reviewed the purpose of the CT enterography, pt knows to expect a call from radiology scheduling to set up this appt. We have discussed the balloon enteroscopy. Pt knows that we will contact her about a [facility] hospital date.

## 2021-12-21 NOTE — ED Triage Notes (Signed)
Pt reports neck pain and right arm pain x 1 1/2 month. States pain is interfering with her work and sleep. Tramadol and gabapentin gives no relief.  States Pt reports she has a MRI schedule for 12/26/2020.

## 2021-12-21 NOTE — Telephone Encounter (Signed)
This patient was seen in the office earlier this week for ongoing abdominal pain, intermittent obstructive symptoms, diarrhea and prior CT findings suggesting intussusception and possible enteritis.  (Please see my note for details).  My plan for further testing after that visit was a small bowel video capsule study, which is scheduled for 12/31/2021.  However, I have been thinking more about her case in the last few days, have reviewed her CT scans again discussed the case with a colleague, and have come up with a different plan. As she and I had discussed, I am concerned that the small bowel video capsule could get lodged if there is an area of narrowing, possibly requiring repeat surgery.  Looking at the most recent CT scan abdomen from September 22, the abnormal area in the small bowel is likely within reach of a balloon enteroscopy, a special type of scope able to get further into the small bowel than the scope I did in 2020.  Dr. Bryan Lemma performs this procedure and I reviewed the case with him.  We agree that if there are similar findings on a CT now in a similar area, it is likely to be reached with a special scope.  If so, then a diagnosis might be made, thereby eliminating the need for the video capsule study and its risks.  I do not what small bowel condition she has, but I am concerned that it could be small bowel Crohn's disease, even though her surgeon reportedly did not see any visibly abnormal small bowel at the time of laparoscopy in early November.  So my recommendations at this point are as follows:   - Cancel the upcoming video capsule study   - CT enterography to evaluate generalized abdominal pain, diarrhea, nausea and small bowel abnormalities on prior CT.   -CBC with differential, serum IgE level, ESR, CRP   -Look at Dr. Corrie Mckusick long schedule for an available time to do a single balloon enteroscopy (Dr. Bryan Lemma is copied on this message as well)  If Mariah Rice is  agreeable to all of that, please make the arrangements.  (Of note, she appears to be in the ED today for cervical spine pain, so if you do not reach her by phone, please try again on Monday)  - HD

## 2021-12-25 NOTE — Telephone Encounter (Signed)
Thank you for the update - I appreciate your efforts on this patient.  - HD

## 2021-12-26 ENCOUNTER — Other Ambulatory Visit: Payer: Self-pay

## 2021-12-26 ENCOUNTER — Encounter: Payer: Self-pay | Admitting: Family Medicine

## 2021-12-26 ENCOUNTER — Ambulatory Visit (HOSPITAL_COMMUNITY)
Admission: RE | Admit: 2021-12-26 | Discharge: 2021-12-26 | Disposition: A | Discharge status: Home / Self Care | Payer: 59 | Source: Ambulatory Visit | Attending: Family Medicine | Admitting: Family Medicine

## 2021-12-26 ENCOUNTER — Other Ambulatory Visit: Payer: Self-pay | Admitting: Family Medicine

## 2021-12-26 DIAGNOSIS — M542 Cervicalgia: Secondary | ICD-10-CM | POA: Diagnosis not present

## 2021-12-26 DIAGNOSIS — R29898 Other symptoms and signs involving the musculoskeletal system: Secondary | ICD-10-CM

## 2021-12-31 ENCOUNTER — Other Ambulatory Visit: Payer: Self-pay | Admitting: Family Medicine

## 2021-12-31 ENCOUNTER — Telehealth: Payer: Self-pay

## 2021-12-31 NOTE — Telephone Encounter (Signed)
Received a message from [place] in [place], she stated that patient's CT enterography was cancelled. Cancel reason states "No Chiropodist (Auth required - email sent to the office)". I have sent a message to Joey to follow up on this authorization. Joey stated that he call to follow up on this and they did not receive the clinical documents so he re-faxed them today. Will await response from [place] with update from insurance.

## 2021-12-31 NOTE — Telephone Encounter (Signed)
Approval received for CT enterography Approval # [id number], 12/31/21-03/31/22.  Secure staff message sent to radiology schedulers to contact patient to get appt rescheduled.

## 2022-01-01 ENCOUNTER — Ambulatory Visit (HOSPITAL_COMMUNITY): Payer: 59

## 2022-01-03 ENCOUNTER — Encounter: Payer: Self-pay | Admitting: Family Medicine

## 2022-01-08 ENCOUNTER — Encounter: Payer: Self-pay | Admitting: Gastroenterology

## 2022-01-10 ENCOUNTER — Telehealth: Payer: Self-pay

## 2022-01-10 NOTE — Telephone Encounter (Signed)
My chart message sent to patient to follow up on CT appt that has not been rescheduled.

## 2022-01-11 NOTE — Telephone Encounter (Signed)
Pt has reviewed my chart message. Pt's CT appt has been rescheduled to 01/22/22 at 8:30 am.

## 2022-01-21 ENCOUNTER — Encounter: Payer: Self-pay | Admitting: Family Medicine

## 2022-01-21 ENCOUNTER — Other Ambulatory Visit: Payer: Self-pay | Admitting: Family Medicine

## 2022-01-21 DIAGNOSIS — M542 Cervicalgia: Secondary | ICD-10-CM

## 2022-01-21 NOTE — Telephone Encounter (Signed)
Due to insurance plan, there isn't an Development worker, community in the area.  Patient was referred to [facility] Neurology at her request but they denied it stating they don't treat her diagnosis.  Will check with MD to see if there is another route patient can try.   Thanks [employer]

## 2022-01-21 NOTE — Progress Notes (Signed)
Called patient in regard to her previous referral for neurosurgery.  Apparently the only group in our area is not in her network and the referral was then closed.  I recommended to the patient that she call her insurance company to ask for local in network neurosurgeons.  Explained to her that she may have to go to [facility], [place], [place], etc. according to what her network allows.  I explained to her that I would put the referral back in for neurosurgery.  I explained that with her history of arm weakness in the setting of spinal canal stenosis and severe right and moderate left neural foraminal narrowing that I would like for her to be evaluated by neurosurgeon to make sure this is not a problem and easily improved by surgery.  She is in agreement with this and plans to call her insurance company for options.

## 2022-01-22 ENCOUNTER — Other Ambulatory Visit: Payer: Self-pay

## 2022-01-22 ENCOUNTER — Encounter (HOSPITAL_COMMUNITY): Payer: Self-pay

## 2022-01-22 ENCOUNTER — Ambulatory Visit (HOSPITAL_COMMUNITY)
Admission: RE | Admit: 2022-01-22 | Discharge: 2022-01-22 | Disposition: A | Discharge status: Home / Self Care | Payer: 59 | Source: Ambulatory Visit | Attending: Gastroenterology | Admitting: Gastroenterology

## 2022-01-22 DIAGNOSIS — R1084 Generalized abdominal pain: Secondary | ICD-10-CM | POA: Insufficient documentation

## 2022-01-22 DIAGNOSIS — R194 Change in bowel habit: Secondary | ICD-10-CM | POA: Diagnosis present

## 2022-01-22 DIAGNOSIS — K561 Intussusception: Secondary | ICD-10-CM | POA: Insufficient documentation

## 2022-01-22 DIAGNOSIS — R112 Nausea with vomiting, unspecified: Secondary | ICD-10-CM | POA: Insufficient documentation

## 2022-01-22 LAB — POCT I-STAT CREATININE: Creatinine, Ser: 0.7 mg/dL (ref 0.44–1.00)

## 2022-01-22 MED ORDER — BARIUM SULFATE 0.1 % PO SUSP
450.0000 mL | Freq: Once | ORAL | Status: AC
  Administered 2022-01-22: 450 mL via ORAL

## 2022-01-22 MED ORDER — BARIUM SULFATE 0.1 % PO SUSP
ORAL | Status: AC
  Filled 2022-01-22: qty 3

## 2022-01-22 MED ORDER — SODIUM CHLORIDE (PF) 0.9 % IJ SOLN
INTRAMUSCULAR | Status: AC
  Filled 2022-01-22: qty 50

## 2022-01-22 MED ORDER — IOHEXOL 300 MG/ML  SOLN
100.0000 mL | Freq: Once | INTRAMUSCULAR | Status: AC | PRN
  Administered 2022-01-22: 100 mL via INTRAVENOUS

## 2022-01-24 ENCOUNTER — Telehealth: Payer: Self-pay

## 2022-01-24 NOTE — Telephone Encounter (Signed)
See CT result phone note

## 2022-01-24 NOTE — Telephone Encounter (Signed)
-----   Message from Doran Stabler, MD sent at 01/23/2022  4:58 PM EST ----- Herbert Seta,   I reviewed the report and images from this CT enterography, and also discussed the case with Dr. Bryan Lemma afterward.  Curiously, the previously seen areas of swelling in the small bowel are no longer seen. If she does in fact have small bowel Crohn's disease (which is currently my main concern based on her clinical course and previous CT scan findings), then this improvement in CT scan appearance could be accounted for by a recent short course of steroids that she received for symptomatic cervical spine disc problems.  After discussing the case with Dr. Bryan Lemma, we agree she should proceed with the enteroscopy test with him scheduled for February 23.  HD

## 2022-01-24 NOTE — Telephone Encounter (Signed)
Patient returned your call and would like you to call her as soon as you can.  Thank you.

## 2022-01-24 NOTE — Telephone Encounter (Signed)
Left message for patient to please call back. 

## 2022-01-26 ENCOUNTER — Other Ambulatory Visit: Payer: Self-pay | Admitting: Family Medicine

## 2022-01-30 ENCOUNTER — Encounter (HOSPITAL_COMMUNITY): Payer: Self-pay | Admitting: Gastroenterology

## 2022-02-07 ENCOUNTER — Encounter (HOSPITAL_COMMUNITY): Payer: Self-pay | Admitting: Gastroenterology

## 2022-02-07 ENCOUNTER — Ambulatory Visit (HOSPITAL_COMMUNITY)
Admission: RE | Admit: 2022-02-07 | Discharge: 2022-02-07 | Disposition: A | Discharge status: Home / Self Care | Payer: 59 | Attending: Gastroenterology | Admitting: Gastroenterology

## 2022-02-07 ENCOUNTER — Ambulatory Visit (HOSPITAL_COMMUNITY): Payer: 59 | Admitting: Anesthesiology

## 2022-02-07 ENCOUNTER — Ambulatory Visit (HOSPITAL_BASED_OUTPATIENT_CLINIC_OR_DEPARTMENT_OTHER): Payer: 59 | Admitting: Anesthesiology

## 2022-02-07 ENCOUNTER — Other Ambulatory Visit: Payer: Self-pay

## 2022-02-07 ENCOUNTER — Encounter (HOSPITAL_COMMUNITY)
Admission: RE | Disposition: A | Discharge status: Home / Self Care | Payer: Self-pay | Source: Home / Self Care | Attending: Gastroenterology

## 2022-02-07 DIAGNOSIS — R1084 Generalized abdominal pain: Secondary | ICD-10-CM | POA: Insufficient documentation

## 2022-02-07 DIAGNOSIS — K561 Intussusception: Secondary | ICD-10-CM

## 2022-02-07 DIAGNOSIS — I10 Essential (primary) hypertension: Secondary | ICD-10-CM | POA: Insufficient documentation

## 2022-02-07 DIAGNOSIS — R112 Nausea with vomiting, unspecified: Secondary | ICD-10-CM

## 2022-02-07 DIAGNOSIS — F17210 Nicotine dependence, cigarettes, uncomplicated: Secondary | ICD-10-CM | POA: Insufficient documentation

## 2022-02-07 DIAGNOSIS — R933 Abnormal findings on diagnostic imaging of other parts of digestive tract: Secondary | ICD-10-CM

## 2022-02-07 DIAGNOSIS — K219 Gastro-esophageal reflux disease without esophagitis: Secondary | ICD-10-CM | POA: Diagnosis not present

## 2022-02-07 DIAGNOSIS — J449 Chronic obstructive pulmonary disease, unspecified: Secondary | ICD-10-CM | POA: Insufficient documentation

## 2022-02-07 DIAGNOSIS — F418 Other specified anxiety disorders: Secondary | ICD-10-CM

## 2022-02-07 HISTORY — PX: BIOPSY: SHX5522

## 2022-02-07 HISTORY — PX: ENTEROSCOPY: SHX5533

## 2022-02-07 HISTORY — PX: BALLOON ENTEROSCOPY: SHX6863

## 2022-02-07 SURGERY — ENTEROSCOPY
Anesthesia: Monitor Anesthesia Care

## 2022-02-07 MED ORDER — LACTATED RINGERS IV SOLN: INTRAVENOUS | Status: DC

## 2022-02-07 MED ORDER — LIDOCAINE 2% (20 MG/ML) 5 ML SYRINGE
INTRAMUSCULAR | Status: DC | PRN
  Administered 2022-02-07: 100 mg via INTRAVENOUS

## 2022-02-07 MED ORDER — LABETALOL HCL 5 MG/ML IV SOLN
INTRAVENOUS | Status: DC | PRN
Start: 2022-02-07 — End: 2022-02-07
  Administered 2022-02-07 (×3): 2.5 mg via INTRAVENOUS

## 2022-02-07 MED ORDER — PROPOFOL 500 MG/50ML IV EMUL
INTRAVENOUS | Status: DC | PRN
  Administered 2022-02-07: 150 ug/kg/min via INTRAVENOUS

## 2022-02-07 MED ORDER — PROPOFOL 10 MG/ML IV BOLUS
INTRAVENOUS | Status: DC | PRN
  Administered 2022-02-07: 20 mg via INTRAVENOUS
  Administered 2022-02-07: 40 mg via INTRAVENOUS

## 2022-02-07 MED ORDER — SODIUM CHLORIDE 0.9 % IV SOLN: INTRAVENOUS | Status: DC

## 2022-02-07 NOTE — H&P (Signed)
GASTROENTEROLOGY PROCEDURE H&P NOTE   Primary Care Physician: Lurline Del, DO    Reason for Procedure:  Abnormal imaging, generalized abdominal pain  Plan:    Single balloon enteroscopy  Patient is appropriate for endoscopic procedure(s) at [facility] End[facility] unit.  The nature of the procedure, as well as the risks, benefits, and alternatives were carefully and thoroughly reviewed with the patient. Ample time for discussion and questions allowed. The patient understood, was satisfied, and agreed to proceed.     HPI: Mariah Rice is a 51 y.o. female who presents for single balloon enteroscopy for evaluation of abnormal imaging and generalized abdominal pain.  -04/23/2019: CT AP: Moderately dilated proximal small bowel loops are seen with transition point in the left lower abdomen. This is consistent with a partial small bowel obstruction, and may be due to adhesion. Small bowel wall thickening is seen involving the proximal jejunum, suspicious for enteritis. No evidence of pneumatosis. - 08/27/2021: CT AP: 2 adjacent small bowel intussusceptions are seen in the proximal jejunum in the left upper quadrant.  Abnormal jejunal wall thickening is seen in this region, and this could be due to enteritis although a lead mass cannot be excluded. No other areas of small-bowel wall thickening are seen. No evidence of bowel obstruction.  No lymphadenopathy. - 10/16/2021: Robotic assisted laparoscopy with reportedly normal-appearing small bowel.  No visible abnormalities, no resection performed. - 12/18/2021: Follow-up in the GI clinic with Dr. Loletha Carrow - 01/22/2022: CT enterography: No small bowel intussusception on today's study with complete resolution of the 2 regions identified on the previous exam. There is no bowel wall thickening or perienteric edema/inflammation on today's study. Terminal ileum is normal in appearance. No secondary findings of small bowel stricture.  Past Medical History:   Diagnosis Date   Abnormal finding on GI tract imaging    Anaphylaxis 07/26/2020   Anxiety    COPD (chronic obstructive pulmonary disease) (HCC)    COPD exacerbation ([place]) 07/17/2020   Dental abscess    Depression    Family history of breast cancer    Family history of breast cancer 01/26/2019   Family history of ovarian cancer    Genetic testing 03/08/2019   Negative genetic testing on the CancerNext-Expanded+RNAinsight testing.  The CancerNext-Expanded gene panel offered by [facility] and includes sequencing and rearrangement analysis for the following 67 genes: AIP, ALK, APC*, ATM*, BAP1, BARD1, BLM, BMPR1A, BRCA1*, BRCA2*, BRIP1*, CDH1*, CDK4, CDKN1B, CDKN2A, CHEK2*, DICER1, FANCC, FH, FLCN, GALNT12, HOXB13, MAX, MEN1, MET, MLH1*, MRE11A, MSH2*   GERD (gastroesophageal reflux disease)    Headache    migraines   Hypertension    Hyperthyroidism 1990s   PONV (postoperative nausea and vomiting)    Shortness of breath dyspnea    with anxiety   Small bowel obstruction (HCC)    Tick bite of abdomen 05/13/2017    Past Surgical History:  Procedure Laterality Date   DILATION AND CURETTAGE OF UTERUS  1999   ENTEROSCOPY N/A 05/12/2019   Procedure: ENTEROSCOPY;  Surgeon: Doran Stabler, MD;  Location: [facility];  Service: Gastroenterology;  Laterality: N/A;   FOOT SURGERY Right    INCISE AND DRAIN ABCESS  2005   LAPAROSCOPIC ASSISTED VAGINAL HYSTERECTOMY N/A 07/11/2015   Procedure: LAPAROSCOPIC ASSISTED VAGINAL Total HYSTERECTOMY;  Surgeon: Osborne Oman, MD;  Location: [facility];  Service: Gynecology;  Laterality: N/A;   LAPAROSCOPIC BILATERAL SALPINGECTOMY Bilateral 07/11/2015   Procedure: LAPAROSCOPIC BILATERAL SALPINGO OOPHORECTOMY ;  Surgeon: Osborne Oman,  MD;  Location: [facility];  Service: Gynecology;  Laterality: Bilateral;   tubal ligation  2000   WISDOM TOOTH EXTRACTION     XI ROBOT ASSISTED DIAGNOSTIC LAPAROSCOPY N/A 10/16/2021   Procedure: XI ROBOT ASSISTED  DIAGNOSTIC LAPAROSCOPY;  Surgeon: Olean Ree, MD;  Location: [facility];  Service: General;  Laterality: N/A;    Prior to Admission medications   Medication Sig Start Date End Date Taking? Authorizing Provider  acetaminophen (TYLENOL) 500 MG tablet Take 2 tablets (1,000 mg total) by mouth every 6 (six) hours as needed for mild pain. 10/16/21  Yes Piscoya, Jacqulyn Bath, MD  albuterol (VENTOLIN HFA) 108 (90 Base) MCG/ACT inhaler Inhale 2 puffs into the lungs every 6 (six) hours as needed for wheezing or shortness of breath.   Yes [provider]  EPINEPHrine 0.3 mg/0.3 mL IJ SOAJ injection Inject 0.3 mg into the muscle as needed for anaphylaxis (for difficulty breathing, throat, tongue or lip swelling. must come to ED after use.). 09/21/21  Yes Welborn, Ryan, DO  estradiol (ESTRACE) 2 MG tablet Take 1 tablet (2 mg total) by mouth daily. 03/05/21  Yes Benay Pike, MD  fluticasone (FLONASE) 50 MCG/ACT nasal spray USE 1 SPRAY IN EACH NOSTRIL ONCE DAILY FOR 14 DAYS Patient taking differently: Place 1 spray into both nostrils at bedtime. 10/04/21  Yes Welborn, Ryan, DO  hydrOXYzine (ATARAX) 50 MG tablet Take 1 tablet (50 mg total) by mouth at bedtime as needed. 12/21/21  Yes Jaynee Eagles, PA-C  methocarbamol (ROBAXIN) 750 MG tablet TAKE 1 TABLET BY MOUTH 2 TIMES DAILY AS NEEDED FOR MUSCLE SPASMS. 01/01/22  Yes Welborn, Ryan, DO  omeprazole (PRILOSEC) 20 MG capsule Take 1 capsule (20 mg total) by mouth daily. 09/21/21  Yes Welborn, Ryan, DO  ondansetron (ZOFRAN) 4 MG tablet TAKE 1 TABLET BY MOUTH EVERY 4 HOURS AS NEEDED FOR NAUSEA OR VOMITING 01/28/22  Yes Welborn, Ryan, DO  oxcarbazepine (TRILEPTAL) 600 MG tablet Take 1 tablet (600 mg total) by mouth 2 (two) times daily. 01/19/21  Yes Milus Banister C, DO  pravastatin (PRAVACHOL) 40 MG tablet Take 1 tablet (40 mg total) by mouth daily. Patient taking differently: Take 40 mg by mouth at bedtime. 12/18/21  Yes Welborn, Ryan, DO  propranolol ER (INDERAL LA) 60 MG  24 hr capsule Take 1 capsule (60 mg total) by mouth daily. 09/21/21  Yes Welborn, Ryan, DO  SPIRIVA RESPIMAT 2.5 MCG/ACT AERS Inhale 2 puffs into the lungs daily. 08/14/21  Yes Lattie Haw, MD  gabapentin (NEURONTIN) 100 MG capsule Take 1 capsule (100 mg total) by mouth 3 (three) times daily. Patient not taking: Reported on 02/05/2022 12/03/21   Lurline Del, DO  polyethylene glycol powder (GLYCOLAX/MIRALAX) 17 GM/SCOOP powder Take 17 g by mouth daily. Patient not taking: Reported on 02/05/2022 09/21/21   Lurline Del, DO    No current facility-administered medications for this encounter.    Allergies as of 12/21/2021 - Review Complete 12/21/2021  Allergen Reaction Noted   Amoxicillin Anaphylaxis 10/10/2021   Azithromycin Anaphylaxis 07/20/2020   Meloxicam Nausea And Vomiting 10/14/2018   Sumatriptan Nausea Only, Rash, and Other (See Comments) 12/12/2017   Penicillins Nausea Only and Rash 11/10/2012   Sulfa antibiotics Nausea Only and Rash 11/10/2012    Family History  Problem Relation Age of Onset   Ovarian cancer Mother 1       d. 44   Heart disease Father    Hypertension Father    COPD Father    Hypertension Paternal  Aunt 66   Lung cancer Paternal Aunt 29   Breast cancer Paternal Aunt 33   Thyroid disease Maternal Grandmother    Lymphoma Maternal Grandmother 61       NHL, recurrance at 80   Lung cancer Maternal Grandmother 61   Hypertension Paternal Grandmother    Heart disease Paternal Grandmother    Heart Problems Paternal Grandmother    Dementia Paternal Grandmother    Colon cancer Neg Hx    Esophageal cancer Neg Hx    Stomach cancer Neg Hx    Pancreatic cancer Neg Hx     Social History   Socioeconomic History   Marital status: Single    Spouse name: Not on file   Number of children: Not on file   Years of education: Not on file   Highest education level: Not on file  Occupational History   Not on file  Tobacco Use   Smoking status: Every Day     Packs/day: 0.50    Years: 30.00    Pack years: 15.00    Types: Cigarettes    Start date: 12/16/1986   Smokeless tobacco: Never   Tobacco comments:    Smoked 1 ppd for 25 years  Vaping Use   Vaping Use: Never used  Substance and Sexual Activity   Alcohol use: Not Currently    Alcohol/week: 0.0 standard drinks   Drug use: No   Sexual activity: Not on file  Other Topics Concern   Not on file  Social History Narrative   Not on file   Social Determinants of Health   Financial Resource Strain: Not on file  Food Insecurity: Not on file  Transportation Needs: Not on file  Physical Activity: Not on file  Stress: Not on file  Social Connections: Not on file  Intimate Partner Violence: Not on file    Physical Exam: Vital signs in last 24 hours: @LMP   (LMP Unknown) Comment: on Megace GEN: NAD EYE: Sclerae anicteric ENT: MMM CV: Non-tachycardic Pulm: CTA b/l GI: Soft, NT/ND NEURO:  Alert & Oriented x [place], DO [facility]   02/07/2022 9:50 AM

## 2022-02-07 NOTE — Interval H&P Note (Signed)
History and Physical Interval Note:  02/07/2022 11:00 AM  Mariah Rice  has presented today for surgery, with the diagnosis of small bowel abnormalities on prior CT, nausea.  The various methods of treatment have been discussed with the patient and family. After consideration of risks, benefits and other options for treatment, the patient has consented to  Procedure(s) with comments: ENTEROSCOPY (N/A) - single balloon as a surgical intervention.  The patient's history has been reviewed, patient examined, no change in status, stable for surgery.  I have reviewed the patient's chart and labs.  Questions were answered to the patient's satisfaction.     [name] Katherene Dinino

## 2022-02-07 NOTE — Anesthesia Postprocedure Evaluation (Signed)
Anesthesia Post Note  Patient: Mariah Rice  Procedure(s) Performed: ENTEROSCOPY     Patient location during evaluation: Endoscopy Anesthesia Type: MAC Level of consciousness: awake and alert Pain management: pain level controlled Vital Signs Assessment: post-procedure vital signs reviewed and stable Respiratory status: spontaneous breathing, nonlabored ventilation, respiratory function stable and patient connected to nasal cannula oxygen Cardiovascular status: blood pressure returned to baseline and stable Postop Assessment: no apparent nausea or vomiting Anesthetic complications: no   No notable events documented.  Last Vitals:  Vitals:   02/07/22 1210 02/07/22 1220  BP: (!) 185/93 (!) 186/98  Pulse: 67 66  Resp: 17 15  Temp:    SpO2: 96% 97%    Last Pain:  Vitals:   02/07/22 1014  TempSrc: Temporal  PainSc: 0-No pain                 Sherod Cisse L Brodin Gelpi

## 2022-02-07 NOTE — Discharge Instructions (Signed)
YOU HAD AN ENDOSCOPIC PROCEDURE TODAY: Refer to the procedure report and other information in the discharge instructions given to you for any specific questions about what was found during the examination. If this information does not answer your questions, please call [place] office at [phone] to clarify.   YOU SHOULD EXPECT: Some feelings of bloating in the abdomen. Passage of more gas than usual. Walking can help get rid of the air that was put into your GI tract during the procedure and reduce the bloating. If you had a lower endoscopy (such as a colonoscopy or flexible sigmoidoscopy) you may notice spotting of blood in your stool or on the toilet paper. Some abdominal soreness may be present for a day or two, also.  DIET: Your first meal following the procedure should be a light meal and then it is ok to progress to your normal diet. A half-sandwich or bowl of soup is an example of a good first meal. Heavy or fried foods are harder to digest and may make you feel nauseous or bloated. Drink plenty of fluids but you should avoid alcoholic beverages for 24 hours. If you had a esophageal dilation, please see attached instructions for diet.    ACTIVITY: Your care partner should take you home directly after the procedure. You should plan to take it easy, moving slowly for the rest of the day. You can resume normal activity the day after the procedure however YOU SHOULD NOT DRIVE, use power tools, machinery or perform tasks that involve climbing or major physical exertion for 24 hours (because of the sedation medicines used during the test).   SYMPTOMS TO REPORT IMMEDIATELY: A gastroenterologist can be reached at any hour. Please call [phone]  for any of the following symptoms:   Following upper endoscopy (EGD, EUS, ERCP, esophageal dilation) Vomiting of blood or coffee ground material  New, significant abdominal pain  New, significant chest pain or pain under the shoulder blades  Painful or  persistently difficult swallowing  New shortness of breath  Black, tarry-looking or red, bloody stools  FOLLOW UP:  If any biopsies were taken you will be contacted by phone or by letter within the next 1-3 weeks. Call [phone]  if you have not heard about the biopsies in 3 weeks.  Please also call with any specific questions about appointments or follow up tests.  

## 2022-02-07 NOTE — Transfer of Care (Signed)
Immediate Anesthesia Transfer of Care Note  Patient: Mariah Rice  Procedure(s) Performed: ENTEROSCOPY  Patient Location: PACU and Endoscopy Unit  Anesthesia Type:MAC  Level of Consciousness: awake and alert   Airway & Oxygen Therapy: Patient Spontanous Breathing and Patient connected to face mask oxygen  Post-op Assessment: Report given to RN and Post -op Vital signs reviewed and stable  Post vital signs: Reviewed and stable  Last Vitals:  Vitals Value Taken Time  BP 161/82 02/07/22 1152  Temp    Pulse 74 02/07/22 1152  Resp 18 02/07/22 1152  SpO2 96 % 02/07/22 1152  Vitals shown include unvalidated device data.  Last Pain:  Vitals:   02/07/22 1014  TempSrc: Temporal  PainSc: 0-No pain         Complications: No notable events documented.

## 2022-02-07 NOTE — Anesthesia Preprocedure Evaluation (Addendum)
Anesthesia Evaluation  Patient identified by MRN, date of birth, ID band Patient awake    Reviewed: Allergy & Precautions, NPO status , Patient's Chart, lab work & pertinent test results, reviewed documented beta blocker date and time   History of Anesthesia Complications (+) PONV and history of anesthetic complications  Airway Mallampati: III  TM Distance: >3 FB Neck ROM: Full    Dental  (+) Dental Advisory Given, Chipped,    Pulmonary COPD,  COPD inhaler, Current SmokerPatient did not abstain from smoking.,    Pulmonary exam normal breath sounds clear to auscultation       Cardiovascular hypertension, Pt. on home beta blockers and Pt. on medications Normal cardiovascular exam Rhythm:Regular Rate:Normal  TTE 2021 1. Left ventricular ejection fraction, by estimation, is 60 to 65%. The  left ventricle has normal function. The left ventricle has no regional  wall motion abnormalities. Left ventricular diastolic parameters are  indeterminate.  2. Right ventricular systolic function is normal. The right ventricular  size is normal. Tricuspid regurgitation signal is inadequate for assessing  PA pressure.  3. The mitral valve is normal in structure. No evidence of mitral valve  regurgitation.  4. The aortic valve was not well visualized. Aortic valve regurgitation  is not visualized. No aortic stenosis is present.  5. The inferior vena cava is normal in size with greater than 50%  respiratory variability, suggesting right atrial pressure of 3 mmHg.    Neuro/Psych  Headaches, PSYCHIATRIC DISORDERS Anxiety Depression    GI/Hepatic Neg liver ROS, GERD  Controlled and Medicated,  Endo/Other  negative endocrine ROS  Renal/GU negative Renal ROS  negative genitourinary   Musculoskeletal negative musculoskeletal ROS (+)   Abdominal   Peds  Hematology negative hematology ROS (+)   Anesthesia Other Findings    Reproductive/Obstetrics                            Anesthesia Physical Anesthesia Plan  ASA: 3  Anesthesia Plan: MAC   Post-op Pain Management:    Induction: Intravenous  PONV Risk Score and Plan: Propofol infusion and Treatment may vary due to age or medical condition  Airway Management Planned: Natural Airway  Additional Equipment:   Intra-op Plan:   Post-operative Plan:   Informed Consent: I have reviewed the patients History and Physical, chart, labs and discussed the procedure including the risks, benefits and alternatives for the proposed anesthesia with the patient or authorized representative who has indicated his/her understanding and acceptance.     Dental advisory given  Plan Discussed with: CRNA  Anesthesia Plan Comments:         Anesthesia Quick Evaluation

## 2022-02-07 NOTE — Op Note (Signed)
[facility] Patient Name: Mariah Rice Procedure Date: 02/07/2022 MRN: [id number] Attending MD: Gerrit Heck , MD Date of Birth: 1971-04-19 CSN: [id number] Age: 51 Admit Type: Outpatient Procedure:                Small bowel enteroscopy (Single balloon enteroscopy) Indications:              Abnormal abdominal CT x2                           - 04/23/2019: CT AP: Moderately dilated proximal                            small bowel loops are seen with transition point in                            the left lower abdomen. This is                           consistent with a partial small bowel obstruction,                            and may be due to adhesion. Small bowel wall                            thickening is seen involving the proximal                           jejunum, suspicious for enteritis. No evidence of                            pneumatosis.                           - 08/27/2021: CT AP: 2 adjacent small bowel                            intussusceptions are seen in the proximal jejunum                            in the left upper quadrant. Abnormal jejunal wall                            thickening is seen in this region, and this could                            be due to enteritis although a lead mass cannot be                            excluded. No other areas of small-bowel wall                            thickening are seen. No evidence of bowel  obstruction. No lymphadenopathy.                           - 10/16/2021: Robotic assisted laparoscopy with                            reportedly normal-appearing small bowel. No visible                            abnormalities, no resection performed.                           - 01/22/2022: CT enterography: No small bowel                            intussusception on today's study with complete                            resolution of the 2 regions identified on the                             previous exam. There is no bowel wall thickening or                            perienteric edema/inflammation on today's study.                            Terminal ileum is normal in appearance. No                            secondary findings of small bowel stricture. Providers:                Gerrit Heck, MD, Dulcy Fanny, Luan Moore, Technician, Glenis Smoker, CRNA Referring MD:              Medicines:                Monitored Anesthesia Care Complications:            No immediate complications. Estimated Blood Loss:     Estimated blood loss was minimal. Procedure:                Pre-Anesthesia Assessment:                           - Prior to the procedure, a History and Physical                            was performed, and patient medications and                            allergies were reviewed. The patient's tolerance of                            previous anesthesia  was also reviewed. The risks                            and benefits of the procedure and the sedation                            options and risks were discussed with the patient.                            All questions were answered, and informed consent                            was obtained. Prior Anticoagulants: The patient has                            taken no previous anticoagulant or antiplatelet                            agents. ASA Grade Assessment: III - A patient with                            severe systemic disease. After reviewing the risks                            and benefits, the patient was deemed in                            satisfactory condition to undergo the procedure.                           After obtaining informed consent, the endoscope was                            passed under direct vision. Throughout the                            procedure, the patient's blood pressure, pulse, and                            oxygen saturations were  monitored continuously. The                            [id number]-Q180 ([id number]) Olympus enteroscope with single                            balloon overtube was introduced through the mouth                            and advanced as far as possible. The overtube was                            then advanced through the pylorus, and through a  series of balloon inflations/deflations and                            advancement/reduction of the enteroscope, the                            enteroscope was advanced to the mid-jejunum.                            Advanced the enteroscope approximately 150 cm                            beyond the pylorus. The single balloon enteroscopy                            was accomplished without difficulty. The patient                            tolerated the procedure well. Scope In: Scope Out: Findings:      The esophagus was normal.      The stomach was normal.      There was no evidence of significant pathology in the entire examined       duodenum.      There was no evidence of significant pathology in the proximal jejunum       and in the mid-jejunum. Advanced the enteroscope approximately 150 cm       beyond the pylorus. There were no areas of mucosal erythema, edema,       erosions, or ulceration. There were no areas of obstruction of luminal       narrowing in the examined small bowel. Biopsies were taken throughout       the small bowel with a cold forceps for histology. Estimated blood loss       was minimal. Impression:               - Normal esophagus.                           - Normal stomach.                           - Normal examined duodenum.                           - The examined portion of the jejunum was normal.                            Biopsied. Recommendation:           - Discharge patient to home (with escort).                           - Await pathology results.                           - Continue  present medications.                           -  Advance diet as tolerated back to baseline.                           - Follow-up with Dr. Loletha Carrow in the GI clinic at                            appointment to be scheduled. Procedure Code(s):        --- Professional ---                           [id number], Small intestinal endoscopy, enteroscopy                            beyond second portion of duodenum, not including                            ileum; with biopsy, single or multiple Diagnosis Code(s):        --- Professional ---                           R93.3, Abnormal findings on diagnostic imaging of                            other parts of digestive tract CPT copyright 2019 American Medical Association. All rights reserved. The codes documented in this report are preliminary and upon coder review may  be revised to meet current compliance requirements. Gerrit Heck, MD 02/07/2022 11:53:19 AM Number of Addenda: 0

## 2022-02-07 NOTE — Anesthesia Procedure Notes (Signed)
Date/Time: 02/07/2022 11:08 AM Performed by: Cynda Familia, CRNA Pre-anesthesia Checklist: Patient identified, Suction available, Emergency Drugs available, Patient being monitored and Timeout performed Patient Re-evaluated:Patient Re-evaluated prior to induction Oxygen Delivery Method: Simple face mask Placement Confirmation: positive ETCO2 and breath sounds checked- equal and bilateral Dental Injury: Teeth and Oropharynx as per pre-operative assessment

## 2022-02-08 ENCOUNTER — Encounter (HOSPITAL_COMMUNITY): Payer: Self-pay | Admitting: Gastroenterology

## 2022-02-08 LAB — SURGICAL PATHOLOGY

## 2022-02-11 ENCOUNTER — Other Ambulatory Visit: Payer: Self-pay

## 2022-02-12 ENCOUNTER — Encounter: Payer: Self-pay | Admitting: Family Medicine

## 2022-02-12 NOTE — Telephone Encounter (Signed)
Called patient to discuss. Patient is not currently having SHOB and is able to speak in complete sentences. Patient states that she feels like that she has been having a flare up with COPD and allergies. Patient states that she is almost out of albuterol inhaler and would like a refill in case she needs it.   Offered to schedule patient appointment with next available doctor. Patient requests to schedule with PCP. PCP next available is Monday, 3/6. Scheduled for 3/6 and provided with strict ED precautions. Patient verbalizes understanding.   Talbot Grumbling, RN

## 2022-02-13 ENCOUNTER — Encounter: Payer: Self-pay | Admitting: Gastroenterology

## 2022-02-13 MED ORDER — ALBUTEROL SULFATE HFA 108 (90 BASE) MCG/ACT IN AERS: 2.0000 | INHALATION_SPRAY | Freq: Four times a day (QID) | RESPIRATORY_TRACT | 1 refills | Status: DC | PRN

## 2022-02-17 NOTE — Progress Notes (Signed)
? ? ?  SUBJECTIVE:  ? ?CHIEF COMPLAINT / HPI:  ? ?Fatigue: ?Patient states that for the past week or so she is felt fatigued.  She denies any change in her cough, no fevers, no symptoms of a viral respiratory infection.  She denies any alcohol other than maybe 1 glass of wine every day or 2.  She denies any excess caffeine.  She states that she has been told in the past that she snores when she sleeps.  She has not had any change in her work schedule.  She states that she did have a "thyroid issue" a long time ago.  She has been using Atarax to sleep at night due to right arm pain and is awaiting to get in with neurosurgery for this. ? ?PERTINENT  PMH / PSH: prediabetes ? ?OBJECTIVE:  ? ?BP 132/79   Pulse 64   Ht '5\' 4"'$  (1.626 m)   Wt 216 lb 9.6 oz (98.2 kg)   LMP  (LMP Unknown) Comment: on Megace  SpO2 99%   BMI 37.18 kg/m?   ? ? ?General: NAD, pleasant, able to participate in exam ?HEENT: No cervical lymphadenopathy, no pharyngeal erythema ?Cardiac: RRR, no murmurs. ?Respiratory: CTAB, normal effort, No wheezes, rales or rhonchi ?Skin: warm and dry, no rashes noted ?Neuro: alert, no obvious focal deficits ?Psych: Normal affect and mood ? ?ASSESSMENT/PLAN:  ? ?Elevated blood pressure reading: ?Blood pressure appropriate today.  Continue monitoring. ? ?Fatigue: ?We will check CBC, CMP, TSH.  Sleep apnea is a possibility but the symptoms of only been going on for about a week and a half.  She has been taking Atarax to help her sleep due to right arm pain for which she is awaiting getting in with neurosurgery.  I discussed ending the Atarax as well to monitor her symptoms.  She is going to follow-up in 1 week to discuss the results of the lab work and to decide neck steps.  We will consider referral for sleep study if lab work does not explain her fatigue. ? ?Lurline Del, DO ?[place]  ? ? ? ?

## 2022-02-18 ENCOUNTER — Ambulatory Visit (INDEPENDENT_AMBULATORY_CARE_PROVIDER_SITE_OTHER): Payer: 59 | Admitting: Family Medicine

## 2022-02-18 ENCOUNTER — Encounter: Payer: Self-pay | Admitting: Family Medicine

## 2022-02-18 ENCOUNTER — Other Ambulatory Visit: Payer: Self-pay

## 2022-02-18 VITALS — BP 132/79 | HR 64 | Ht 64.0 in | Wt 216.6 lb

## 2022-02-18 DIAGNOSIS — R5383 Other fatigue: Secondary | ICD-10-CM

## 2022-02-18 DIAGNOSIS — R29898 Other symptoms and signs involving the musculoskeletal system: Secondary | ICD-10-CM

## 2022-02-19 LAB — COMPREHENSIVE METABOLIC PANEL
ALT: 11 IU/L (ref 0–32)
AST: 12 IU/L (ref 0–40)
Albumin/Globulin Ratio: 1.6 (ref 1.2–2.2)
Albumin: 4.2 g/dL (ref 3.8–4.8)
Alkaline Phosphatase: 102 IU/L (ref 44–121)
BUN/Creatinine Ratio: 12 (ref 9–23)
BUN: 8 mg/dL (ref 6–24)
Bilirubin Total: <0.2 mg/dL (ref 0.0–1.2)
CO2: 22 mmol/L (ref 20–29)
Calcium: 8.9 mg/dL (ref 8.7–10.2)
Chloride: 92 mmol/L — ABNORMAL LOW (ref 96–106)
Creatinine, Ser: 0.69 mg/dL (ref 0.57–1.00)
Globulin, Total: 2.6 g/dL (ref 1.5–4.5)
Glucose: 86 mg/dL (ref 70–99)
Potassium: 5 mmol/L (ref 3.5–5.2)
Sodium: 128 mmol/L — ABNORMAL LOW (ref 134–144)
Total Protein: 6.8 g/dL (ref 6.0–8.5)
eGFR: 107 mL/min/{1.73_m2} (ref >59)

## 2022-02-19 LAB — CBC WITH DIFFERENTIAL/PLATELET
Basophils Absolute: 0.1 10*3/uL (ref 0.0–0.2)
Basos: 1 %
EOS (ABSOLUTE): 0.1 10*3/uL (ref 0.0–0.4)
Eos: 2 %
Hematocrit: 40.3 % (ref 34.0–46.6)
Hemoglobin: 14.3 g/dL (ref 11.1–15.9)
Immature Grans (Abs): 0 10*3/uL (ref 0.0–0.1)
Immature Granulocytes: 0 %
Lymphocytes Absolute: 2.6 10*3/uL (ref 0.7–3.1)
Lymphs: 39 %
MCH: 31.2 pg (ref 26.6–33.0)
MCHC: 35.5 g/dL (ref 31.5–35.7)
MCV: 88 fL (ref 79–97)
Monocytes Absolute: 0.5 10*3/uL (ref 0.1–0.9)
Monocytes: 8 %
Neutrophils Absolute: 3.5 10*3/uL (ref 1.4–7.0)
Neutrophils: 50 %
Platelets: 305 10*3/uL (ref 150–450)
RBC: 4.59 x10E6/uL (ref 3.77–5.28)
RDW: 12.8 % (ref 11.7–15.4)
WBC: 6.8 10*3/uL (ref 3.4–10.8)

## 2022-02-19 LAB — TSH: TSH: 3.08 u[IU]/mL (ref 0.450–4.500)

## 2022-02-24 ENCOUNTER — Encounter: Payer: Self-pay | Admitting: Family Medicine

## 2022-02-25 MED ORDER — OXCARBAZEPINE 600 MG PO TABS: 600.0000 mg | ORAL_TABLET | Freq: Two times a day (BID) | ORAL | 6 refills | Status: DC

## 2022-02-26 NOTE — Progress Notes (Signed)
? ? ?  SUBJECTIVE:  ? ?CHIEF COMPLAINT / HPI:  ? ?Follow-up-fatigue: ?Patient previously seen by me on 3/6 with concerns of fatigue.  We checked a CBC, CMP, TSH at that time.  These labs did not show any obvious cause for her fatigue.  She did express some symptoms concerning for sleep apnea with morning fatigue, she has been told she snores.  Today she states she continues to have the fatigue, snoring, and has not yet found a cause.  She endorses waking up multiple times throughout the night. ? ?Right hand/finger tingling: ?Started 3 days ago, she's not sure which fingers bother her most. 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  We have attempted to get her in with neurosurgery but we do not have one in the area that her insurance will take and she decided against traveling to further areas at last discussion.. ? ?History of bipolar disorder: ?No SI or HI today, previously on vraylar and trileptal. Requests getting in with someone to restart these medications.  ? ?PERTINENT  PMH / PSH: depression ? ?OBJECTIVE:  ? ?BP 132/60   Ht '5\' 4"'$  (1.626 m)   Wt 213 lb (96.6 kg)   LMP  (LMP Unknown) Comment: on Megace  BMI 36.56 kg/m?   ? ?General: NAD, pleasant, able to participate in exam ?Respiratory: No respiratory distress ?MSK: No visual abnormality of the right hand, she is neurovascularly intact, strength 5/5 bilaterally with grip and finger separation.  Positive Tinel's and positive Phalen's on the right ?Psych: Normal affect and mood ? ?ASSESSMENT/PLAN:  ? ? Fatigue: ?Previous labs including CBC, CMP, TSH with no obvious causes for her fatigue.  Do have some concern for sleep apnea.  Will refer for sleep study. ? ?Carpal tunnel: ?Present for a few days on the right.  Positive Tinel's and Phalen's.  Discussed using a night wrist splint for the next few weeks.  She is  going to follow-up with me if her symptoms or not improving in 2 weeks and we will discuss neck steps including carpal tunnel injections. ? ?History of bipolar disorder: ?Patient request getting set up with a psychiatrist to restart previous medications.  No SI or HI today.  We will place this referral as well as a referral for our community coordination services to help get her in with psychiatry. ? ? ?Lurline Del, DO ?[place]  ? ? ? ?

## 2022-02-27 ENCOUNTER — Other Ambulatory Visit: Payer: Self-pay

## 2022-02-27 ENCOUNTER — Telehealth: Payer: Self-pay | Admitting: *Deleted

## 2022-02-27 ENCOUNTER — Encounter: Payer: Self-pay | Admitting: Family Medicine

## 2022-02-27 ENCOUNTER — Ambulatory Visit (INDEPENDENT_AMBULATORY_CARE_PROVIDER_SITE_OTHER): Payer: 59 | Admitting: Family Medicine

## 2022-02-27 VITALS — BP 132/60 | HR 89 | Ht 64.0 in | Wt 213.0 lb

## 2022-02-27 DIAGNOSIS — R5383 Other fatigue: Secondary | ICD-10-CM | POA: Diagnosis not present

## 2022-02-27 DIAGNOSIS — R0683 Snoring: Secondary | ICD-10-CM | POA: Diagnosis not present

## 2022-02-27 DIAGNOSIS — F39 Unspecified mood [affective] disorder: Secondary | ICD-10-CM

## 2022-02-27 NOTE — Patient Instructions (Signed)
For your carpal tunnel I would like for you to order a "carpal tunnel wrist splint" which you can find on [facility].  I would like to see back in 2 weeks if this is not improving your symptoms. ? ?I have placed a referral for psychiatry and also for our community health specialist that can help you get in with psychiatry for medication management. ? ?For your fatigue I am placing referral for sleep studies and they should contact you to complete that work-up. ?

## 2022-02-27 NOTE — Chronic Care Management (AMB) (Signed)
?  Care Management  ? ?Note ? ?02/27/2022 ?Name: Mariah Rice MRN: [id number] DOB: [birth year] ? ?Mariah Rice is a 51 y.o. year old female who is a primary care patient of Lurline Del, DO. I reached out to Gemini M Harroun by phone today in response to a referral sent by [name]'s primary care provider.  ? ?[name] was given information about care management services today including:  ?Care management services include personalized support from designated clinical staff supervised by her physician, including individualized plan of care and coordination with other care providers ?24/7 contact phone numbers for assistance for urgent and routine care needs. ?The patient may stop care management services at any time by phone call to the office staff. ? ?Patient agreed to services and verbal consent obtained.  ? ?Follow up plan: ?Telephone appointment with care management team member scheduled for:03/20/22 ? ?Laverda Sorenson  ?Care Guide, Embedded Care Coordination ?[place]  Care Management  ?Direct Dial: [phone] ? ? ?

## 2022-03-06 ENCOUNTER — Encounter: Payer: Self-pay | Admitting: Family Medicine

## 2022-03-07 ENCOUNTER — Other Ambulatory Visit: Payer: Self-pay | Admitting: Family Medicine

## 2022-03-07 ENCOUNTER — Other Ambulatory Visit: Payer: Self-pay

## 2022-03-07 MED ORDER — ESTRADIOL 2 MG PO TABS: 2.0000 mg | ORAL_TABLET | Freq: Every day | ORAL | 0 refills | Status: DC

## 2022-03-07 MED ORDER — METHOCARBAMOL 750 MG PO TABS: ORAL_TABLET | ORAL | 2 refills | Status: DC

## 2022-03-07 NOTE — Telephone Encounter (Signed)
Called patient discussed her request for refill of estradiol.  She states that she was started this after having her hysterectomy a while back.  She is a current smoker and I discussed the risk of this medication including increased risk of blood clots, heart attacks, breast cancer, etc.  I informed her that I can send in a 1 month supply to allow her time to get an office appointment for [place] to fully discuss the risk and benefits of this medication to determine if it is appropriate to continue.  I did explain to her that I recommended trying to reduce this medication and find an alternative particularly long-term going forward to reduce these risks.  Patient is agreeable to this and plans to make a follow-up appointment with me in the next couple of weeks to discuss this medication.  I will send in a 1 month supply. ?

## 2022-03-20 ENCOUNTER — Telehealth: Payer: 59

## 2022-03-20 ENCOUNTER — Encounter: Payer: Self-pay | Admitting: Family Medicine

## 2022-03-21 NOTE — Telephone Encounter (Signed)
?  Care Management  ? ?Follow Up Note ? ? ?03/20/2022 ?Name: Mariah Rice MRN: [id number] DOB: 1971-07-20 ? ? ?Referred by: Lurline Del, DO ?Reason for referral : No chief complaint on file. ? ? ?An unsuccessful telephone outreach was attempted today. The patient was referred to the case management team for assistance with care management and care coordination.  ? ?Follow Up Plan: The care management team will reach out to the patient again over the next 30 days.  ? ?Milus Height, BSW  ?Social Worker ?[facility] Care Management  ?[phone] ?  ?

## 2022-04-01 ENCOUNTER — Other Ambulatory Visit: Payer: Self-pay | Admitting: Family Medicine

## 2022-04-04 ENCOUNTER — Telehealth: Payer: Self-pay | Admitting: *Deleted

## 2022-04-04 ENCOUNTER — Ambulatory Visit: Payer: 59 | Admitting: Podiatry

## 2022-04-04 NOTE — Chronic Care Management (AMB) (Signed)
?  Care Management  ? ?Note ? ?04/04/2022 ?Name: Mariah Rice MRN: [id number] DOB: 1971/08/07 ? ?Mariah Rice is a 51 y.o. year old female who is a primary care patient of Lurline Del, DO and is actively engaged with the care management team. I reached out to Chevonne M Duhamel by phone today to assist with re-scheduling an initial visit with the BSW ? ?Follow up plan: ?Unsuccessful telephone outreach attempt made. A HIPAA compliant phone message was left for the patient providing contact information and requesting a return call.  ?The care management team will reach out to the patient again over the next 7 days.  ?If patient returns call to provider office, please advise to call [place] at [phone]. ? ?Laverda Sorenson  ?Care Guide, Embedded Care Coordination ?[place]  Care Management  ?Direct Dial: [phone] ? ?

## 2022-04-04 NOTE — Telephone Encounter (Signed)
ATC patient x1.  No answer.  LVM to return call.  When patient returns call, please see if patient can come in at 9:30 am instead of 9:00 am on 4/27.  Tammy will not be available that day until 9:30 am.  Will await return call. ?

## 2022-04-11 ENCOUNTER — Institutional Professional Consult (permissible substitution): Payer: 59 | Admitting: Adult Health

## 2022-04-11 NOTE — Chronic Care Management (AMB) (Signed)
?  Care Management  ? ?Note ? ?04/11/2022 ?Name: Mariah Rice MRN: [id number] DOB: 1971/04/09 ? ?Mariah Rice is a 51 y.o. year old female who is a primary care patient of Lurline Del, DO and is actively engaged with the care management team. I reached out to Annasophia M Verbeke by phone today to assist with re-scheduling an initial visit with the BSW ? ?Follow up plan: ?Unsuccessful telephone outreach attempt made. A HIPAA compliant phone message was left for the patient providing contact information and requesting a return call.  ?The care management team will reach out to the patient again over the next 7 days.  ?If patient returns call to provider office, please advise to call [place] at [phone]. ? ?Laverda Sorenson  ?Care Guide, Embedded Care Coordination ?[place]  Care Management  ?Direct Dial: [phone] ? ? ?

## 2022-04-12 NOTE — Chronic Care Management (AMB) (Signed)
?  Care Management  ? ?Note ? ?04/12/2022 ?Name: Mariah Rice MRN: [id number] DOB: [birth year] ? ?Mariah Rice is a 51 y.o. year old female who is a primary care patient of Lurline Del, DO and is actively engaged with the care management team. I reached out to [name] by phone today to assist with re-scheduling an initial visit with the BSW ? ?Follow up plan: ?A third unsuccessful telephone outreach attempt made. A HIPAA compliant phone message was left for the patient providing contact information and requesting a return call.  ?Unable to make contact on outreach attempts x 3. PCP Lurline Del, DO notified via routed documentation in medical record. We have been unable to make contact with the patient for follow up. The care management team is available to follow up with the patient after provider conversation with the patient regarding recommendation for care management engagement and subsequent re-referral to the care management team.  ? ?Laverda Sorenson  ?Care Guide, Embedded Care Coordination ?[place]  Care Management  ?Direct Dial: [phone] ? ?

## 2022-04-18 ENCOUNTER — Institutional Professional Consult (permissible substitution): Payer: 59 | Admitting: Adult Health

## 2022-04-18 ENCOUNTER — Encounter: Payer: Self-pay | Admitting: Family Medicine

## 2022-04-18 ENCOUNTER — Ambulatory Visit: Payer: 59 | Admitting: Family Medicine

## 2022-04-18 NOTE — Progress Notes (Deleted)
    SUBJECTIVE:   CHIEF COMPLAINT / HPI:   Migraines: Previously used propanolol but was unable to increase further due to interactions with her inhaler.  In the past used sumatriptan but developed an allergy to it.  Has also tried Topamax in the past***. She states***.  PERTINENT  PMH / PSH: ***  OBJECTIVE:   LMP  (LMP Unknown) Comment: on Megace ***  General: NAD, pleasant, able to participate in exam Cardiac: RRR, no murmurs. Respiratory: CTAB, normal effort, No wheezes, rales or rhonchi Skin: warm and dry, no rashes noted Neuro: alert, CN II through XII intact, fine touch sensation intact in upper and lower extremities bilaterally, strength 5/5 in upper and lower extremities bilaterally Psych: Normal affect and mood  ASSESSMENT/PLAN:   No problem-specific Assessment & Plan notes found for this encounter.   Migraines:   Lurline Del, [place]    {    This will disappear when note is signed, click to select method of visit    :1}

## 2022-05-01 NOTE — Progress Notes (Signed)
SUBJECTIVE:   CHIEF COMPLAINT / HPI:   Frequent migraines: Previously used propanolol but was unable to increase further due to interactions with her inhaler.  In the past used sumatriptan but developed an allergy to it.  Has also tried Topamax in the past but make her nauseated. She states she has been having them about every other day which she thinks may be due to work schedule and seasonal allergies. She previously used propranolol '80mg'$  and noted it worked well but was reduced to '60mg'$  due to copd history and increase in cough. She is using flonase at night and zyrtec daily.  She does not think the zyrtec is working well for her like it used to. She states when her migraines occur they are unilateral and associated with nausea and photophobia.  She is not currently having a migraine.  Renew disability parking pass: Patient had a right ankle surgery last May and still has some issues with arthritis and pain with ambulating long distances.  I previously completed disability parking pass for her because of this.  She presents today to renew this.  Her primary issue with this is that limits her ability to ambulate long distances due to pain and discomfort.  PERTINENT  PMH / PSH: History of migraines  OBJECTIVE:   BP 138/83   Pulse 60   Ht '5\' 4"'$  (1.626 m)   Wt 214 lb 6 oz (97.2 kg)   LMP  (LMP Unknown) Comment: on Megace  SpO2 97%   BMI 36.80 kg/m    General: NAD, pleasant, able to participate in exam Respiratory: No respiratory distress Neuro: alert, no focal deficits MSK: Right ankle with surgical scars present on the lateral medial aspect which appear well-healed.  She does have some discomfort when manipulating the ankle joint as well as palpating the lateral and medial joint line of the ankle.  No erythema or swelling. Psych: Normal affect and mood  ASSESSMENT/PLAN:   Migraines: Continues to have these about 3 times per week.  Her migraines are unilateral and associated with  photophobia and nausea.  She has had migraines for a long period of time and has tried multiple different avenues for prevention including propranolol, sumatriptan, Topamax.  Her propranolol is limited in the maximum dose due to history of COPD and coughing when increased, she also has allergies to triptans and Topamax in the past has made her nauseated.  Discussed that I think it would be reasonable to get her in with neurology to evaluate if some of the newer migraine medications may be appropriate for her.  She is in agreement with this.  Additionally her allergy symptoms seem to make her migraines worse and so I discussed switching her Zyrtec to Claritin and using the Flonase 2 sprays daily in each nare versus once daily.  She is going to follow-up with me as needed  Encounter for completion of form: Patient also presents to renew her disability parking pass.  This was completed due to pain with ambulating long distances because of a previous right ankle surgery which occurred last year.  On physical exam her surgical scars are well healed but she does have some discomfort with manipulating the joint.  I do think is reasonable to extend her parking disability pass and have done so for 6 months.  I completed the paperwork with the patient and provided it to her.  She is going follow-up as needed.  She request refill of her Spiriva which I have placed.  Lurline Del, [place]

## 2022-05-02 ENCOUNTER — Ambulatory Visit (INDEPENDENT_AMBULATORY_CARE_PROVIDER_SITE_OTHER): Payer: 59 | Admitting: Family Medicine

## 2022-05-02 ENCOUNTER — Encounter: Payer: Self-pay | Admitting: Family Medicine

## 2022-05-02 VITALS — BP 138/83 | HR 60 | Ht 64.0 in | Wt 214.4 lb

## 2022-05-02 DIAGNOSIS — G43809 Other migraine, not intractable, without status migrainosus: Secondary | ICD-10-CM

## 2022-05-02 DIAGNOSIS — J449 Chronic obstructive pulmonary disease, unspecified: Secondary | ICD-10-CM

## 2022-05-02 MED ORDER — SPIRIVA RESPIMAT 2.5 MCG/ACT IN AERS: 2.0000 | INHALATION_SPRAY | Freq: Every day | RESPIRATORY_TRACT | 11 refills | Status: DC

## 2022-05-02 NOTE — Patient Instructions (Signed)
I am renewing your disability parking pass for 6 months.  I provided you the form for this today.  For your migraines I think the next step would be to get you in with neurology to see if there are any additional medications we can use.  I do not think it is a good idea to increase the propranolol right now until we see what options neurology may have.  I have placed this referral and they should reach out to you in the next week or 2 to get you set up.

## 2022-05-07 ENCOUNTER — Encounter: Payer: Self-pay | Admitting: Neurology

## 2022-05-19 ENCOUNTER — Other Ambulatory Visit: Payer: Self-pay | Admitting: Family Medicine

## 2022-05-21 ENCOUNTER — Encounter: Payer: Self-pay | Admitting: *Deleted

## 2022-05-27 ENCOUNTER — Ambulatory Visit
Admission: EM | Admit: 2022-05-27 | Discharge: 2022-05-27 | Disposition: A | Discharge status: Home / Self Care | Payer: 59 | Attending: Nurse Practitioner | Admitting: Nurse Practitioner

## 2022-05-27 DIAGNOSIS — R21 Rash and other nonspecific skin eruption: Secondary | ICD-10-CM

## 2022-05-27 MED ORDER — PERMETHRIN 5 % EX CREA: TOPICAL_CREAM | CUTANEOUS | 0 refills | Status: DC

## 2022-05-27 NOTE — ED Provider Notes (Signed)
[facility]    CSN: [id number] Arrival date & time: 05/27/22  1600      History   Chief Complaint Chief Complaint  Patient presents with   Rash    That's itching so bad - Entered by patient    HPI Mariah Rice is a 51 y.o. female.   Patient presents today for intensely pruritic rash that has been ongoing for the past 2 weeks.  Reports that she was at the beach last week, however rash started before this.  Reports the rash is in her groin, thighs, abdomen, back, neck, arms.  It is very itchy and there are small bumps.  She denies any redness, burning, oozing, blisters.  She denies any pain.  Denies any recent change in detergents, soaps, personal care products.  She has tried calamine lotion, hydrocortisone cream, and Benadryl without relief.  She denies any shortness of breath, throat or tongue swelling, and new muscle pain or joint aches.    Past Medical History:  Diagnosis Date   Abnormal finding on GI tract imaging    Anaphylaxis 07/26/2020   Anxiety    COPD (chronic obstructive pulmonary disease) (HCC)    COPD exacerbation ([place]) 07/17/2020   Dental abscess    Depression    Family history of breast cancer    Family history of breast cancer 01/26/2019   Family history of ovarian cancer    Genetic testing 03/08/2019   Negative genetic testing on the CancerNext-Expanded+RNAinsight testing.  The CancerNext-Expanded gene panel offered by [facility] and includes sequencing and rearrangement analysis for the following 67 genes: AIP, ALK, APC*, ATM*, BAP1, BARD1, BLM, BMPR1A, BRCA1*, BRCA2*, BRIP1*, CDH1*, CDK4, CDKN1B, CDKN2A, CHEK2*, DICER1, FANCC, FH, FLCN, GALNT12, HOXB13, MAX, MEN1, MET, MLH1*, MRE11A, MSH2*   GERD (gastroesophageal reflux disease)    Headache    migraines   Hypertension    Hyperthyroidism 1990s   PONV (postoperative nausea and vomiting)    Shortness of breath dyspnea    with anxiety   Small bowel obstruction (HCC)    Tick bite of  abdomen 05/13/2017    Patient Active Problem List   Diagnosis Date Noted   Generalized abdominal pain    Small bowel intussusception ([place])    Pre-operative clearance 10/11/2021   Closed nondisplaced fracture of right calcaneus with routine healing 01/05/2021   Plantar fasciitis 09/12/2020   Sinus disorder 08/02/2020   Bilateral leg edema 08/02/2020   Anxiety and depression    Abnormal finding on GI tract imaging    Hyponatremia 05/03/2019   COPD suggested by initial evaluation ([place]) 02/15/2019   Chronic bilateral low back pain without sciatica 06/06/2018   Prediabetes 05/31/2018   Migraine without aura and with status migrainosus, not intractable 11/30/2017   Neck pain 03/26/2017   Mood disorder ([place]) 10/06/2016   Right ear pain 07/23/2016   GERD (gastroesophageal reflux disease) 10/19/2015   H/O bilateral oophorectomy 08/13/2015   Tobacco abuse 11/10/2012   Obesity (BMI 35.0-39.9 without comorbidity) 11/10/2012   Hypertension 11/10/2012    Past Surgical History:  Procedure Laterality Date   BALLOON ENTEROSCOPY  02/07/2022   Procedure: BALLOON ENTEROSCOPY;  Surgeon: Lavena Bullion, DO;  Location: [facility];  Service: Gastroenterology;;   BIOPSY  02/07/2022   Procedure: BIOPSY;  Surgeon: Lavena Bullion, DO;  Location: [facility];  Service: Gastroenterology;;   DILATION AND CURETTAGE OF UTERUS  1999   ENTEROSCOPY N/A 05/12/2019   Procedure: ENTEROSCOPY;  Surgeon: Doran Stabler, MD;  Location: [facility];  Service: Gastroenterology;  Laterality: N/A;   ENTEROSCOPY N/A 02/07/2022   Procedure: ENTEROSCOPY;  Surgeon: Lavena Bullion, DO;  Location: [facility];  Service: Gastroenterology;  Laterality: N/A;  single balloon   FOOT SURGERY Right    INCISE AND DRAIN ABCESS  2005   LAPAROSCOPIC ASSISTED VAGINAL HYSTERECTOMY N/A 07/11/2015   Procedure: LAPAROSCOPIC ASSISTED VAGINAL Total HYSTERECTOMY;  Surgeon: Osborne Oman, MD;  Location: [facility];  Service:  Gynecology;  Laterality: N/A;   LAPAROSCOPIC BILATERAL SALPINGECTOMY Bilateral 07/11/2015   Procedure: LAPAROSCOPIC BILATERAL SALPINGO OOPHORECTOMY ;  Surgeon: Osborne Oman, MD;  Location: [facility];  Service: Gynecology;  Laterality: Bilateral;   tubal ligation  2000   WISDOM TOOTH EXTRACTION     XI ROBOT ASSISTED DIAGNOSTIC LAPAROSCOPY N/A 10/16/2021   Procedure: XI ROBOT ASSISTED DIAGNOSTIC LAPAROSCOPY;  Surgeon: Olean Ree, MD;  Location: [facility];  Service: General;  Laterality: N/A;    OB History     Gravida  4   Para  2   Term  2   Preterm      AB  2   Living  2      SAB  2   IAB      Ectopic      Multiple      Live Births               Home Medications    Prior to Admission medications   Medication Sig Start Date End Date Taking? Authorizing Provider  permethrin (ELIMITE) 5 % cream Apply to affected area once 05/27/22  Yes Eulogio Bear, NP  acetaminophen (TYLENOL) 500 MG tablet Take 2 tablets (1,000 mg total) by mouth every 6 (six) hours as needed for mild pain. 10/16/21   Piscoya, Jacqulyn Bath, MD  albuterol (VENTOLIN HFA) 108 (90 Base) MCG/ACT inhaler Inhale 2 puffs into the lungs every 6 (six) hours as needed for wheezing or shortness of breath. 02/13/22   Lurline Del, DO  EPINEPHrine 0.3 mg/0.3 mL IJ SOAJ injection Inject 0.3 mg into the muscle as needed for anaphylaxis (for difficulty breathing, throat, tongue or lip swelling. must come to ED after use.). 09/21/21   Lurline Del, DO  estradiol (ESTRACE) 2 MG tablet TAKE 1 TABLET BY MOUTH EVERY DAY 04/01/22   Welborn, Ryan, DO  fluticasone (FLONASE) 50 MCG/ACT nasal spray USE 1 SPRAY IN EACH NOSTRIL ONCE DAILY FOR 14 DAYS Patient taking differently: Place 1 spray into both nostrils at bedtime. 10/04/21   Lurline Del, DO  gabapentin (NEURONTIN) 100 MG capsule Take 1 capsule (100 mg total) by mouth 3 (three) times daily. Patient not taking: Reported on 02/05/2022 12/03/21   Lurline Del, DO   hydrOXYzine (ATARAX) 50 MG tablet Take 1 tablet (50 mg total) by mouth at bedtime as needed. 12/21/21   Jaynee Eagles, PA-C  methocarbamol (ROBAXIN) 750 MG tablet TAKE 1 TABLET BY MOUTH 2 TIMES DAILY AS NEEDED FOR MUSCLE SPASMS. 05/20/22   Lurline Del, DO  omeprazole (PRILOSEC) 20 MG capsule Take 1 capsule (20 mg total) by mouth daily. 09/21/21   Welborn, Ryan, DO  ondansetron (ZOFRAN) 4 MG tablet TAKE 1 TABLET BY MOUTH EVERY 4 HOURS AS NEEDED FOR NAUSEA OR VOMITING 01/28/22   Lurline Del, DO  oxcarbazepine (TRILEPTAL) 600 MG tablet Take 1 tablet (600 mg total) by mouth 2 (two) times daily. 02/25/22   Welborn, Ryan, DO  polyethylene glycol powder (GLYCOLAX/MIRALAX) 17 GM/SCOOP powder Take 17 g by mouth daily. Patient  not taking: Reported on 02/05/2022 09/21/21   Lurline Del, DO  pravastatin (PRAVACHOL) 40 MG tablet Take 1 tablet (40 mg total) by mouth daily. Patient taking differently: Take 40 mg by mouth at bedtime. 12/18/21   Lurline Del, DO  propranolol ER (INDERAL LA) 60 MG 24 hr capsule Take 1 capsule (60 mg total) by mouth daily. 09/21/21   Welborn, Ryan, DO  SPIRIVA RESPIMAT 2.5 MCG/ACT AERS Inhale 2 puffs into the lungs daily. 05/02/22   Lurline Del, DO    Family History Family History  Problem Relation Age of Onset   Ovarian cancer Mother 30       d. 46   Heart disease Father    Hypertension Father    COPD Father    Hypertension Paternal Aunt 55   Lung cancer Paternal Aunt 74   Breast cancer Paternal Aunt 57   Thyroid disease Maternal Grandmother    Lymphoma Maternal Grandmother 61       NHL, recurrance at 31   Lung cancer Maternal Grandmother 61   Hypertension Paternal Grandmother    Heart disease Paternal Grandmother    Heart Problems Paternal Grandmother    Dementia Paternal Grandmother    Colon cancer Neg Hx    Esophageal cancer Neg Hx    Stomach cancer Neg Hx    Pancreatic cancer Neg Hx     Social History Social History   Tobacco Use   Smoking status: Every Day     Packs/day: 0.50    Years: 30.00    Total pack years: 15.00    Types: Cigarettes    Start date: 12/16/1986   Smokeless tobacco: Never   Tobacco comments:    Smoked 1 ppd for 25 years  Vaping Use   Vaping Use: Never used  Substance Use Topics   Alcohol use: Yes    Comment: Occas   Drug use: No     Allergies   Amoxicillin, Azithromycin, Meloxicam, Sumatriptan, Penicillins, and Sulfa antibiotics   Review of Systems Review of Systems Per HPI  Physical Exam Triage Vital Signs ED Triage Vitals  Enc Vitals Group     BP 05/27/22 1628 (!) 153/90     Pulse Rate 05/27/22 1628 64     Resp 05/27/22 1628 18     Temp 05/27/22 1628 97.7 F (36.5 C)     Temp Source 05/27/22 1628 Oral     SpO2 05/27/22 1628 94 %     Weight --      Height --      Head Circumference --      Peak Flow --      Pain Score 05/27/22 1624 0     Pain Loc --      Pain Edu? --      Excl. in [place]? --    No data found.  Updated Vital Signs BP (!) 153/90 (BP Location: Right Arm)   Pulse 64   Temp 97.7 F (36.5 C) (Oral)   Resp 18   LMP  (LMP Unknown) Comment: on Megace  SpO2 94%   Visual Acuity Right Eye Distance:   Left Eye Distance:   Bilateral Distance:    Right Eye Near:   Left Eye Near:    Bilateral Near:     Physical Exam Vitals and nursing note reviewed.  Constitutional:      General: She is not in acute distress.    Appearance: Normal appearance. She is not toxic-appearing.  Pulmonary:     Effort:  Pulmonary effort is normal. No respiratory distress.  Skin:    General: Skin is warm.     Capillary Refill: Capillary refill takes less than 2 seconds.     Coloration: Skin is not jaundiced or pale.     Findings: Rash present. No erythema. Rash is papular and urticarial.     Comments: Widespread, various distinct, papular lesions to trunk, neck, bilateral arms.  There is no surrounding erythema, fluctuance, or drainage.    Neurological:     Mental Status: She is alert and oriented to  person, place, and time.  Psychiatric:        Behavior: Behavior is cooperative.      UC Treatments / Results  Labs (all labs ordered are listed, but only abnormal results are displayed) Labs Reviewed - No data to display  EKG   Radiology No results found.  Procedures Procedures (including critical care time)  Medications Ordered in UC Medications - No data to display  Initial Impression / Assessment and Plan / UC Course  I have reviewed the triage vital signs and the nursing notes.  Pertinent labs & imaging results that were available during my care of the patient were reviewed by me and considered in my medical decision making (see chart for details).    Suspect scabies.  Treat with permethrin cream once.  Encouraged use of 25 to 50 mg of Benadryl at nighttime.  Discussed hygiene of bed linens and clothing.  Note given for work.  Seek care if symptoms persist or worsen despite treatment. Final Clinical Impressions(s) / UC Diagnoses   Final diagnoses:  Rash and nonspecific skin eruption   Discharge Instructions   None    ED Prescriptions     Medication Sig Dispense Auth. Provider   permethrin (ELIMITE) 5 % cream Apply to affected area once 60 g Eulogio Bear, NP      PDMP not reviewed this encounter.   Eulogio Bear, NP 05/27/22 [phone]

## 2022-05-27 NOTE — ED Triage Notes (Signed)
Pt states she has a rash under her left arm, around her neck, both legs, under breast, on fingers and in her scalp for the past 2 weeks  Pt states she tried Benadryl, Cortizone 10 and calamine lotion without any relief  Pt state she has not changed lotion, detergents, body washes or food  Pt states she is itching really bad

## 2022-05-29 ENCOUNTER — Ambulatory Visit (INDEPENDENT_AMBULATORY_CARE_PROVIDER_SITE_OTHER): Payer: 59 | Admitting: Family Medicine

## 2022-05-29 ENCOUNTER — Ambulatory Visit (INDEPENDENT_AMBULATORY_CARE_PROVIDER_SITE_OTHER): Payer: 59 | Admitting: Licensed Clinical Social Worker

## 2022-05-29 ENCOUNTER — Encounter: Payer: Self-pay | Admitting: Family Medicine

## 2022-05-29 ENCOUNTER — Encounter (HOSPITAL_COMMUNITY): Payer: Self-pay | Admitting: Licensed Clinical Social Worker

## 2022-05-29 VITALS — BP 146/84 | HR 62 | Ht 64.0 in | Wt 210.4 lb

## 2022-05-29 DIAGNOSIS — F411 Generalized anxiety disorder: Secondary | ICD-10-CM

## 2022-05-29 DIAGNOSIS — L282 Other prurigo: Secondary | ICD-10-CM

## 2022-05-29 DIAGNOSIS — F4310 Post-traumatic stress disorder, unspecified: Secondary | ICD-10-CM | POA: Insufficient documentation

## 2022-05-29 DIAGNOSIS — F3130 Bipolar disorder, current episode depressed, mild or moderate severity, unspecified: Secondary | ICD-10-CM | POA: Insufficient documentation

## 2022-05-29 MED ORDER — PREDNISONE 20 MG PO TABS: 20.0000 mg | ORAL_TABLET | Freq: Every day | ORAL | 0 refills | Status: AC

## 2022-05-29 MED ORDER — CETIRIZINE HCL 10 MG PO TABS: 10.0000 mg | ORAL_TABLET | Freq: Every day | ORAL | 0 refills | Status: DC

## 2022-05-29 MED ORDER — FAMOTIDINE 20 MG PO TABS: 20.0000 mg | ORAL_TABLET | Freq: Two times a day (BID) | ORAL | 0 refills | Status: DC

## 2022-05-29 NOTE — Progress Notes (Signed)
    SUBJECTIVE:   CHIEF COMPLAINT / HPI:   Patient presents for pruritic rash that started 2 weeks ago.  They initially thought it might be due to fleas from her dogs and had an extremity, clean the house but patient's symptoms still persisted.  They also have the dogs cleaned with no improvement.  She was seen in the urgent care several days ago and was diagnosed as possible scabies and given permethrin cream.  She notes that her husband has never had any of the symptoms, though he is taking the cream with her as well now.  She is still not noticing improvement and has had all of her sheets and everything in the house washed in hot water and dried with high heat.  The itching is better now that she is now scratching herself until she bleeds, it is mainly localized to her neck, upper and lower extremities.  PERTINENT  PMH / PSH: Reviewed  OBJECTIVE:   BP (!) 146/84   Pulse 62   Ht '5\' 4"'$  (1.626 m)   Wt 210 lb 6.4 oz (95.4 kg)   LMP  (LMP Unknown) Comment: on Megace  SpO2 98%   BMI 36.12 kg/m   General: NAD, well-appearing, well-nourished Respiratory: No respiratory distress, breathing comfortably, able to speak in full sentences Skin: Several erythematous nodules present on neck, bilateral upper extremities.  None visualized on the back around the stomach.  Several bleeding excoriations noted on the left upper extremity. Psych: Appropriate affect and mood  ASSESSMENT/PLAN:   Pruritic rash Consideration for a bug bite etiology, less likely scabies given lack of response to permethrin and has been has not been affected by the symptoms.  Unclear if there is an allergy that patient may have reacted to, does not appear consistent with urticaria given that the lesions have not been migratory and do not have the typical appearance.  At this time, will consider it to be a possible bug bite and will treat with the intention of improving the pruritic symptoms. - Prednisone 20 mg daily x5 days   - Zyrtec 10 mg 1-2 tablets per day - Famotidine 20 mg daily - If not improving after steroid treatment, patient is to seek care afterwards - Educated patient on the possible side effects of prednisone use including insomnia, irritability, appetite changes   Darnell Stimson, DO [place]
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
## 2022-05-29 NOTE — Patient Instructions (Signed)
I am not completely sure of what this rash is from, there is a possibility was from bug bites or from another source.  We will try to get the rash calm down with several medications.  These are not geared to treat anything other than the inflammatory reaction that is causing your itching.  Keep an eye on to see if you have any new spots that pop up after starting his medications.  Please try not to itch the areas.  I can start you on steroids that you will take once a day for the next 5 days, steroids can cause issues with sleeping and can also make people little more irritable.  We risk and start you on medications called Zyrtec and famotidine to help.  You are able to take Zyrtec twice a day if you need it.  If you have no improvement after the steroid course, please come back in and get evaluated.

## 2022-05-31 ENCOUNTER — Telehealth (INDEPENDENT_AMBULATORY_CARE_PROVIDER_SITE_OTHER): Payer: 59 | Admitting: Physician Assistant

## 2022-05-31 ENCOUNTER — Other Ambulatory Visit (HOSPITAL_COMMUNITY): Payer: Self-pay | Admitting: Physician Assistant

## 2022-05-31 DIAGNOSIS — F4310 Post-traumatic stress disorder, unspecified: Secondary | ICD-10-CM | POA: Diagnosis not present

## 2022-05-31 DIAGNOSIS — F3130 Bipolar disorder, current episode depressed, mild or moderate severity, unspecified: Secondary | ICD-10-CM | POA: Diagnosis not present

## 2022-05-31 DIAGNOSIS — F411 Generalized anxiety disorder: Secondary | ICD-10-CM

## 2022-05-31 MED ORDER — GABAPENTIN 100 MG PO CAPS: 100.0000 mg | ORAL_CAPSULE | Freq: Three times a day (TID) | ORAL | 1 refills | Status: DC

## 2022-05-31 MED ORDER — CARIPRAZINE HCL 3 MG PO CAPS: 3.0000 mg | ORAL_CAPSULE | Freq: Every day | ORAL | 1 refills | Status: DC

## 2022-05-31 MED ORDER — CARIPRAZINE HCL 1.5 MG PO CAPS: ORAL_CAPSULE | ORAL | 0 refills | Status: DC
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
## 2022-05-31 NOTE — Telephone Encounter (Signed)
Patient's medication requires a prior authorization.

## 2022-06-04 ENCOUNTER — Encounter (HOSPITAL_COMMUNITY): Payer: Self-pay | Admitting: Physician Assistant

## 2022-06-04 ENCOUNTER — Telehealth (HOSPITAL_COMMUNITY): Payer: Self-pay | Admitting: *Deleted

## 2022-06-04 NOTE — Telephone Encounter (Signed)
VM from patient requesting a PA be done on one of her medicines, but did not name medicine or say who was calling but did leave a phone number I could find her. I assumed it was a PA request for [employer] as its expensive. She is a new patient to [place] and I dont have her past hx or anything in the providers note to say what she has tried and failed, only that she has had success with Vraylar in the past. In past experiences with [facility], which is what patient has for insurance they will not cover it as its not on their formulary. This medicine per her pharmacy out of pocket is 3200.00. It is unlikely to be covered. Notified her PA and he will call to get more history, until then I will not inititate PA.

## 2022-06-05 ENCOUNTER — Other Ambulatory Visit: Payer: Self-pay | Admitting: Family Medicine

## 2022-06-05 ENCOUNTER — Telehealth (HOSPITAL_COMMUNITY): Payer: Self-pay | Admitting: *Deleted

## 2022-06-05 NOTE — Telephone Encounter (Signed)
Call from patient re PA for her Vraylar. She is unable to remember medicines she had tried and failed for her bipolar but will call me back with the list when she can get her thoughts together. I will forward this message to the provider to see if he is able to prescribe and alternative to the Vraylor that would be less expensive. Cash price is 3200.00 She is willing to try something else. When she is able to give me her failed meds I will try to submit the PA.

## 2022-06-05 NOTE — Telephone Encounter (Signed)
Submitted PA to [employer], Mariah Rice is on their profile and now waiting on a determination that can take up to 24 hours. Case # for the 1/5 mg is [id number], 3 mg [id number]. Mariah Rice did call back to say she could not tolerate Depakote, made her sick and Latuda was ineffective. Had some good response with Lamictal at a high dose.

## 2022-06-11 ENCOUNTER — Telehealth (HOSPITAL_COMMUNITY): Payer: Self-pay | Admitting: *Deleted

## 2022-06-12 ENCOUNTER — Telehealth (HOSPITAL_COMMUNITY): Payer: Self-pay | Admitting: *Deleted

## 2022-06-12 NOTE — Telephone Encounter (Signed)
VM from patient, since Arman Filter was not approved seeking what else she can be taking in the absence of Vraylar. Her provider is out of the office this week, will call her with that information and let Ludwig Clarks know on his return she would like a call. Her next appt with the provider is on 07/30/22.

## 2022-06-20 ENCOUNTER — Other Ambulatory Visit: Payer: Self-pay | Admitting: Family Medicine

## 2022-06-24 ENCOUNTER — Other Ambulatory Visit: Payer: Self-pay | Admitting: Family Medicine

## 2022-06-25 ENCOUNTER — Other Ambulatory Visit: Payer: Self-pay | Admitting: Family Medicine

## 2022-07-10 ENCOUNTER — Telehealth (HOSPITAL_COMMUNITY): Payer: Self-pay | Admitting: *Deleted

## 2022-07-10 DIAGNOSIS — F3130 Bipolar disorder, current episode depressed, mild or moderate severity, unspecified: Secondary | ICD-10-CM

## 2022-07-10 NOTE — Telephone Encounter (Signed)
PATIENT CALLED toU F/U  WITH MEDICATION CHANGES.  PATIENT PREVUOUSLY ON  VRAYLAR BUT HAS LOST HER INSURANCE COVERAGE & THAT MEDICATION IS NO LONGER AFFORDABLE.    PRIOR TO VRAYLAR PATIENT WAS ON LAMOTRIGINE 25 MG TAKE (1) TABLET (2) TIMES DAILY .  AND NOTED THAT HER & PROVIDER BEFORE HIS ABSENCE OF OUT OF OFFICE DISCUSSED GOING BACK ON  LAMOTRIGINE 25 MG

## 2022-07-11 MED ORDER — LAMOTRIGINE 25 MG PO TABS: 25.0000 mg | ORAL_TABLET | Freq: Every day | ORAL | 1 refills | Status: DC

## 2022-07-11 NOTE — Telephone Encounter (Signed)
Prescription has been sent. Attempted to call patient 4 times to make it clear that patient will need to take Lamictal '25mg'$  daily for 7 days before increasing to '50mg'$  daily. Patient did not answer the phone.   PGY-3 Damita Dunnings, MD

## 2022-07-11 NOTE — Addendum Note (Signed)
Addended by: Damita Dunnings B on: 07/11/2022 11:36 AM   Modules accepted: Orders

## 2022-07-11 NOTE — Addendum Note (Signed)
Addended by: Damita Dunnings B on: 07/11/2022 11:11 AM   Modules accepted: Orders

## 2022-07-19 ENCOUNTER — Telehealth (HOSPITAL_COMMUNITY): Payer: Self-pay | Admitting: *Deleted

## 2022-07-19 NOTE — Telephone Encounter (Signed)
Call from patient stating she is not doing well with her bipolar and seeking whatever med she can have since we couldn't get her vraylor approved on her current insurance. Rx called in for her Latuda last week but no one able to reach her to inform her it was sent in. She was informed today, reminded of her appt with her provider on the 15 th and will pick her new rx up today from her preferred pharmacy

## 2022-07-20 ENCOUNTER — Other Ambulatory Visit: Payer: Self-pay | Admitting: Family Medicine

## 2022-07-30 ENCOUNTER — Telehealth (INDEPENDENT_AMBULATORY_CARE_PROVIDER_SITE_OTHER): Payer: 59 | Admitting: Physician Assistant

## 2022-07-30 ENCOUNTER — Encounter (HOSPITAL_COMMUNITY): Payer: Self-pay | Admitting: Physician Assistant

## 2022-07-30 DIAGNOSIS — F3130 Bipolar disorder, current episode depressed, mild or moderate severity, unspecified: Secondary | ICD-10-CM | POA: Diagnosis not present

## 2022-07-30 DIAGNOSIS — F4310 Post-traumatic stress disorder, unspecified: Secondary | ICD-10-CM

## 2022-07-30 DIAGNOSIS — F411 Generalized anxiety disorder: Secondary | ICD-10-CM

## 2022-07-30 MED ORDER — AMITRIPTYLINE HCL 10 MG PO TABS: 10.0000 mg | ORAL_TABLET | Freq: Every day | ORAL | 2 refills | Status: DC

## 2022-07-30 MED ORDER — LAMOTRIGINE 100 MG PO TABS: 100.0000 mg | ORAL_TABLET | Freq: Every day | ORAL | 2 refills | Status: DC

## 2022-07-30 MED ORDER — OXCARBAZEPINE 600 MG PO TABS: 600.0000 mg | ORAL_TABLET | Freq: Two times a day (BID) | ORAL | 2 refills | Status: DC

## 2022-07-30 NOTE — Progress Notes (Signed)
[facility] MD/PA/NP OP Progress Note  Virtual Visit via Video Note  I connected with Mariah Rice on 07/30/22 at  2:30 PM EDT by a video enabled telemedicine application and verified that I am speaking with the correct person using two identifiers.  Location: Patient: Home Provider: Clinic   I discussed the limitations of evaluation and management by telemedicine and the availability of in person appointments. The patient expressed understanding and agreed to proceed.  Follow Up Instructions:  I discussed the assessment and treatment plan with the patient. The patient was provided an opportunity to ask questions and all were answered. The patient agreed with the plan and demonstrated an understanding of the instructions.   The patient was advised to call back or seek an in-person evaluation if the symptoms worsen or if the condition fails to improve as anticipated.  I provided 15 minutes of non-face-to-face time during this encounter.  [name], PA   07/30/2022 4:10 PM Mariah Rice  MRN:  [id number]  Chief Complaint:  Chief Complaint  Patient presents with   Follow-up   Medication Management   HPI:   Mariah Rice is a 51 year old female with a past psychiatric history significant for bipolar 1 disorder, PTSD, and generalized anxiety disorder who presents to [facility] via virtual video visit for follow-up and medication management.  Patient is currently being managed on the following medications:  Lamotrigine 50 mg daily Oxcarbazepine 600 mg 2 times daily  Prior to this encounter, provider discussed with patient about the possibility of being placed on Vraylar.  Patient was unable to be placed on Vraylar due to the cost of the medication.  Patient was informed by Dr. Candie Chroman to take lamotrigine 25 mg for 7 days followed by lamotrigine 50 mg daily for the management of her bipolar disorder.  Patient reports that she has noticed a  little difference in her mood.  Patient states that the edge has been taken off her mood since being on lamotrigine that she has been able to calm down some.  Patient notes that she is also not screaming/yelling or jumping off the handle since being placed on lamotrigine.  Patient endorses depression once in a while but states that her episodes are not as frequent as they were before.  She reports that her anxiety is still elevated and endorses stressors related to work.  A PHQ-9 screen was performed with the patient scoring a 12.  A GAD-7 screen was also performed with the patient scoring an 18.  Patient is alert and oriented x 4, calm, cooperative, and fully engaged in conversation during the encounter.  Patient endorses being a little calm but states that her mood is still up in the air.  Patient denies suicidal or homicidal ideations.  She further denies auditory or visual hallucinations and does not appear to be responding to internal/external stimuli.  Patient endorses fair sleep and receives on average 5 hours of sleep each night.  Patient endorses good appetite and eats on average 2 meals per day.  Patient endorses alcohol consumption stating that she is a social drinker.  Patient endorses tobacco use and smokes on average 1/2 pack/day.  Patient denies illicit drug use.  Visit Diagnosis:    ICD-10-CM   1. Bipolar I disorder, most recent episode depressed (HCC)  F31.30 amitriptyline (ELAVIL) 10 MG tablet    oxcarbazepine (TRILEPTAL) 600 MG tablet    lamoTRIgine (LAMICTAL) 100 MG tablet    2. PTSD (  post-traumatic stress disorder)  F43.10     3. GAD (generalized anxiety disorder)  F41.1 amitriptyline (ELAVIL) 10 MG tablet      Past Psychiatric History:  Bipolar 1 disorder PTSD Generalized anxiety disorder  Past Medical History:  Past Medical History:  Diagnosis Date   Abnormal finding on GI tract imaging    Anaphylaxis 07/26/2020   Anxiety    COPD (chronic obstructive pulmonary  disease) (HCC)    COPD exacerbation ([place]) 07/17/2020   Dental abscess    Depression    Family history of breast cancer    Family history of breast cancer 01/26/2019   Family history of ovarian cancer    Genetic testing 03/08/2019   Negative genetic testing on the CancerNext-Expanded+RNAinsight testing.  The CancerNext-Expanded gene panel offered by [facility] and includes sequencing and rearrangement analysis for the following 67 genes: AIP, ALK, APC*, ATM*, BAP1, BARD1, BLM, BMPR1A, BRCA1*, BRCA2*, BRIP1*, CDH1*, CDK4, CDKN1B, CDKN2A, CHEK2*, DICER1, FANCC, FH, FLCN, GALNT12, HOXB13, MAX, MEN1, MET, MLH1*, MRE11A, MSH2*   GERD (gastroesophageal reflux disease)    Headache    migraines   Hypertension    Hyperthyroidism 1990s   PONV (postoperative nausea and vomiting)    Shortness of breath dyspnea    with anxiety   Small bowel obstruction (HCC)    Tick bite of abdomen 05/13/2017    Past Surgical History:  Procedure Laterality Date   BALLOON ENTEROSCOPY  02/07/2022   Procedure: BALLOON ENTEROSCOPY;  Surgeon: Lavena Bullion, DO;  Location: [facility];  Service: Gastroenterology;;   BIOPSY  02/07/2022   Procedure: BIOPSY;  Surgeon: Lavena Bullion, DO;  Location: [facility];  Service: Gastroenterology;;   DILATION AND CURETTAGE OF UTERUS  1999   ENTEROSCOPY N/A 05/12/2019   Procedure: ENTEROSCOPY;  Surgeon: Doran Stabler, MD;  Location: [facility];  Service: Gastroenterology;  Laterality: N/A;   ENTEROSCOPY N/A 02/07/2022   Procedure: ENTEROSCOPY;  Surgeon: Lavena Bullion, DO;  Location: [facility];  Service: Gastroenterology;  Laterality: N/A;  single balloon   FOOT SURGERY Right    INCISE AND DRAIN ABCESS  2005   LAPAROSCOPIC ASSISTED VAGINAL HYSTERECTOMY N/A 07/11/2015   Procedure: LAPAROSCOPIC ASSISTED VAGINAL Total HYSTERECTOMY;  Surgeon: Osborne Oman, MD;  Location: [facility];  Service: Gynecology;  Laterality: N/A;   LAPAROSCOPIC BILATERAL  SALPINGECTOMY Bilateral 07/11/2015   Procedure: LAPAROSCOPIC BILATERAL SALPINGO OOPHORECTOMY ;  Surgeon: Osborne Oman, MD;  Location: [facility];  Service: Gynecology;  Laterality: Bilateral;   tubal ligation  2000   WISDOM TOOTH EXTRACTION     XI ROBOT ASSISTED DIAGNOSTIC LAPAROSCOPY N/A 10/16/2021   Procedure: XI ROBOT ASSISTED DIAGNOSTIC LAPAROSCOPY;  Surgeon: Olean Ree, MD;  Location: [facility];  Service: General;  Laterality: N/A;    Family Psychiatric History:  Patient denies a family history of psychiatric illness  Family History:  Family History  Problem Relation Age of Onset   Ovarian cancer Mother 82       d. 69   Heart disease Father    Hypertension Father    COPD Father    Hypertension Paternal Aunt 81   Lung cancer Paternal Aunt 74   Breast cancer Paternal Aunt 37   Thyroid disease Maternal Grandmother    Lymphoma Maternal Grandmother 23       NHL, recurrance at 49   Lung cancer Maternal Grandmother 61   Hypertension Paternal Grandmother    Heart disease Paternal Grandmother    Heart Problems Paternal Grandmother  Dementia Paternal Grandmother    Colon cancer Neg Hx    Esophageal cancer Neg Hx    Stomach cancer Neg Hx    Pancreatic cancer Neg Hx     Social History:  Social History   Socioeconomic History   Marital status: Single    Spouse name: Not on file   Number of children: Not on file   Years of education: Not on file   Highest education level: Not on file  Occupational History   Not on file  Tobacco Use   Smoking status: Every Day    Packs/day: 1.00    Years: 30.00    Total pack years: 30.00    Types: Cigarettes    Start date: 12/16/1986   Smokeless tobacco: Never   Tobacco comments:    Smoked 1 ppd for 25 years  Vaping Use   Vaping Use: Never used  Substance and Sexual Activity   Alcohol use: Yes    Comment: Occas   Drug use: No   Sexual activity: Not Currently    Birth control/protection: None  Other Topics Concern   Not on file   Social History Narrative   Not on file   Social Determinants of Health   Financial Resource Strain: High Risk (05/29/2022)   Overall Financial Resource Strain (CARDIA)    Difficulty of Paying Living Expenses: Very hard  Food Insecurity: Food Insecurity Present (05/29/2022)   Hunger Vital Sign    Worried About Running Out of Food in the Last Year: Sometimes true    Ran Out of Food in the Last Year: Never true  Transportation Needs: No Transportation Needs (05/29/2022)   PRAPARE - Hydrologist (Medical): No    Lack of Transportation (Non-Medical): No  Physical Activity: Insufficiently Active (05/29/2022)   Exercise Vital Sign    Days of Exercise per Week: 2 days    Minutes of Exercise per Session: 20 min  Stress: Stress Concern Present (05/29/2022)   [place]    Feeling of Stress : Rather much  Social Connections: Moderately Isolated (05/29/2022)   Social Connection and Isolation Panel [NHANES]    Frequency of Communication with Friends and Family: More than three times a week    Frequency of Social Gatherings with Friends and Family: More than three times a week    Attends Religious Services: Never    Marine scientist or Organizations: No    Attends Archivist Meetings: Never    Marital Status: Living with partner    Allergies:  Allergies  Allergen Reactions   Amoxicillin Anaphylaxis   Azithromycin Anaphylaxis   Meloxicam Nausea And Vomiting   Sumatriptan Nausea Only, Rash and Other (See Comments)    Throat felt like it was closing up   Penicillins Nausea Only and Rash    Has patient had a PCN reaction causing immediate rash, facial/tongue/throat swelling, SOB or lightheadedness with hypotension: no Has patient had a PCN reaction causing severe rash involving mucus membranes or skin necrosis:unknown Has patient had a PCN reaction that required hospitalization :  yes Has patient had a PCN reaction occurring within the last 10 years: no If all of the above answers are "NO", then may proceed with Cephalosporin use.    Sulfa Antibiotics Nausea Only and Rash    Metabolic Disorder Labs: Lab Results  Component Value Date   HGBA1C 5.6 10/11/2021   No results found for: "PROLACTIN" Lab Results  Component Value Date   CHOL 162 04/05/2020   TRIG 151 (H) 04/05/2020   HDL 59 04/05/2020   CHOLHDL 2.7 04/05/2020   VLDL 37 01/05/2014   LDLCALC 77 04/05/2020   LDLCALC 142 (H) 09/17/2017   Lab Results  Component Value Date   TSH 3.080 02/18/2022   TSH 3.390 01/20/2019    Therapeutic Level Labs: No results found for: "LITHIUM" No results found for: "VALPROATE" No results found for: "CBMZ"  Current Medications: Current Outpatient Medications  Medication Sig Dispense Refill   amitriptyline (ELAVIL) 10 MG tablet Take 1 tablet (10 mg total) by mouth at bedtime. 30 tablet 2   lamoTRIgine (LAMICTAL) 100 MG tablet Take 1 tablet (100 mg total) by mouth daily. 30 tablet 2   acetaminophen (TYLENOL) 500 MG tablet Take 2 tablets (1,000 mg total) by mouth every 6 (six) hours as needed for mild pain.     albuterol (VENTOLIN HFA) 108 (90 Base) MCG/ACT inhaler Inhale 2 puffs into the lungs every 6 (six) hours as needed for wheezing or shortness of breath. 8 g 1   cetirizine (ZYRTEC) 10 MG tablet TAKE 1 TABLET BY MOUTH EVERY DAY 60 tablet 0   EPINEPHrine 0.3 mg/0.3 mL IJ SOAJ injection Inject 0.3 mg into the muscle as needed for anaphylaxis (for difficulty breathing, throat, tongue or lip swelling. must come to ED after use.). 1 each 0   estradiol (ESTRACE) 2 MG tablet TAKE 1 TABLET BY MOUTH EVERY DAY 90 tablet 1   famotidine (PEPCID) 20 MG tablet TAKE 1 TABLET BY MOUTH TWICE A DAY 180 tablet 1   fluticasone (FLONASE) 50 MCG/ACT nasal spray USE 1 SPRAY IN EACH NOSTRIL ONCE DAILY FOR 14 DAYS (Patient taking differently: Place 1 spray into both nostrils at bedtime.)  48 mL 3   gabapentin (NEURONTIN) 100 MG capsule Take 1 capsule (100 mg total) by mouth 3 (three) times daily. 90 capsule 1   hydrOXYzine (ATARAX) 50 MG tablet Take 1 tablet (50 mg total) by mouth at bedtime as needed. 30 tablet 0   lamoTRIgine (LAMICTAL) 25 MG tablet Take 1 tablet (25 mg total) by mouth daily. For 7 days take 33m (1 tablet) then Increase to 2 tablets (528m after 7 days. 67 tablet 1   methocarbamol (ROBAXIN) 750 MG tablet TAKE 1 TABLET BY MOUTH 2 TIMES DAILY AS NEEDED FOR MUSCLE SPASMS. 30 tablet 0   omeprazole (PRILOSEC) 20 MG capsule Take 1 capsule (20 mg total) by mouth daily. 90 capsule 3   ondansetron (ZOFRAN) 4 MG tablet TAKE 1 TABLET BY MOUTH EVERY 4 HOURS AS NEEDED FOR NAUSEA OR VOMITING 21 tablet 0   oxcarbazepine (TRILEPTAL) 600 MG tablet Take 1 tablet (600 mg total) by mouth 2 (two) times daily. 60 tablet 2   permethrin (ELIMITE) 5 % cream Apply to affected area once 60 g 0   polyethylene glycol powder (GLYCOLAX/MIRALAX) 17 GM/SCOOP powder Take 17 g by mouth daily. (Patient not taking: Reported on 02/05/2022) 3350 g 1   pravastatin (PRAVACHOL) 40 MG tablet Take 1 tablet (40 mg total) by mouth daily. (Patient taking differently: Take 40 mg by mouth at bedtime.) 90 tablet 3   propranolol ER (INDERAL LA) 60 MG 24 hr capsule Take 1 capsule (60 mg total) by mouth daily. 90 capsule 3   SPIRIVA RESPIMAT 2.5 MCG/ACT AERS Inhale 2 puffs into the lungs daily. 4 g 11   No current facility-administered medications for this visit.     Musculoskeletal: Strength & Muscle  Tone: Unable to assess due to telemedicine visit [place]: Unable to assess due to telemedicine visit Patient leans: Unable to assess due to telemedicine visit  Psychiatric Specialty Exam: Review of Systems  Psychiatric/Behavioral:  Positive for sleep disturbance. Negative for decreased concentration, dysphoric mood, hallucinations, self-injury and suicidal ideas. The patient is nervous/anxious. The  patient is not hyperactive.     There were no vitals taken for this visit.There is no height or weight on file to calculate BMI.  General Appearance: Well Groomed  Eye Contact:  Good  Speech:  Clear and Coherent and Normal Rate  Volume:  Normal  Mood:  Anxious and Depressed  Affect:  Congruent  Thought Process:  Coherent, Goal Directed, and Descriptions of Associations: Intact  Orientation:  Full (Time, Place, and Person)  Thought Content: WDL   Suicidal Thoughts:  No  Homicidal Thoughts:  No  Memory:  Immediate;   Good Recent;   Good Remote;   Good  Judgement:  Good  Insight:  Good  Psychomotor Activity:  Normal  Concentration:  Concentration: Good and Attention Span: Good  Recall:  Good  Fund of Knowledge: Good  Language: Good  Akathisia:  No  Handed:  Left  AIMS (if indicated): not done  Assets:  Communication Skills Desire for Improvement Financial Resources/Insurance Housing Social Support Transportation Vocational/Educational  ADL's:  Intact  Cognition: WNL  Sleep:  Fair   Screenings: GAD-7    Flowsheet Row Video Visit from 07/30/2022 in [facility] Video Visit from 05/31/2022 in [facility] Counselor from 05/29/2022 in [facility] Office Visit from 01/05/2020 in [place] Office Visit from 04/22/2017 in [place]  Total GAD-7 Score 18 18 20 19 21       PHQ2-9    Flowsheet Row Video Visit from 07/30/2022 in [facility] Most recent reading at 07/30/2022  2:44 PM Video Visit from 05/31/2022 in [facility] Most recent reading at 05/31/2022  1:21 PM Office Visit from 05/29/2022 in [place] Most recent reading at 05/29/2022 11:14 AM Counselor from 05/29/2022 in [facility] Most recent reading at 05/29/2022  9:13 AM Office Visit from  05/02/2022 in [place] Most recent reading at 05/02/2022  8:26 AM  PHQ-2 Total Score 4 4 3 2 1   PHQ-9 Total Score 12 18 13 11 10       Flowsheet Row Video Visit from 07/30/2022 in [facility] Video Visit from 05/31/2022 in [facility] Counselor from 05/29/2022 in [place] No Risk        Assessment and Plan:   Mariah Rice is a 51 year old female with a past psychiatric history significant for bipolar 1 disorder, PTSD, and generalized anxiety disorder who presents to [facility] via virtual video visit for follow-up and medication management.  She reports that her mood has improved some since being placed on lamotrigine.  Patient still continues to endorse some depression and elevated anxiety.  She also states that her sleep has been fair.  Patient was recommended increasing her dosage of lamotrigine from 50 mg to 100 mg daily for the management of her mood.  Patient was also recommended amitriptyline for the management of the depressive phases of her bipolar disorder.  Patient  was agreeable to recommendations.  Patient's medications to be e-prescribed to pharmacy of choice.  Collaboration of Care: Collaboration of Care: Medication Management AEB provider managing patient's psychiatric medications and Psychiatrist AEB patient being followed by a mental health provider  Patient/Guardian was advised Release of Information must be obtained prior to any record release in order to collaborate their care with an outside provider. Patient/Guardian was advised if they have not already done so to contact the registration department to sign all necessary forms in order for [place] to release information regarding their care.   Consent: Patient/Guardian gives verbal consent for treatment and assignment of benefits for  services provided during this visit. Patient/Guardian expressed understanding and agreed to proceed.   1. Bipolar I disorder, most recent episode depressed (HCC)  - amitriptyline (ELAVIL) 10 MG tablet; Take 1 tablet (10 mg total) by mouth at bedtime.  Dispense: 30 tablet; Refill: 2 - oxcarbazepine (TRILEPTAL) 600 MG tablet; Take 1 tablet (600 mg total) by mouth 2 (two) times daily.  Dispense: 60 tablet; Refill: 2 - lamoTRIgine (LAMICTAL) 100 MG tablet; Take 1 tablet (100 mg total) by mouth daily.  Dispense: 30 tablet; Refill: 2  2. PTSD (post-traumatic stress disorder)   3. GAD (generalized anxiety disorder)  - amitriptyline (ELAVIL) 10 MG tablet; Take 1 tablet (10 mg total) by mouth at bedtime.  Dispense: 30 tablet; Refill: 2  Patient to follow up in 2 months Provider spent a total of 15 minutes with the patient/reviewing patient's chart  [name], PA 07/30/2022, 4:10 PM

## 2022-08-05 ENCOUNTER — Other Ambulatory Visit: Payer: Self-pay | Admitting: Student

## 2022-08-14 ENCOUNTER — Other Ambulatory Visit: Payer: Self-pay | Admitting: Student

## 2022-08-17 ENCOUNTER — Encounter: Payer: Self-pay | Admitting: Student

## 2022-08-17 DIAGNOSIS — J449 Chronic obstructive pulmonary disease, unspecified: Secondary | ICD-10-CM

## 2022-08-20 MED ORDER — SPIRIVA RESPIMAT 2.5 MCG/ACT IN AERS: 2.0000 | INHALATION_SPRAY | Freq: Every day | RESPIRATORY_TRACT | 3 refills | Status: DC

## 2022-08-21 ENCOUNTER — Other Ambulatory Visit (HOSPITAL_COMMUNITY): Payer: Self-pay | Admitting: Physician Assistant

## 2022-08-21 DIAGNOSIS — F411 Generalized anxiety disorder: Secondary | ICD-10-CM

## 2022-08-21 DIAGNOSIS — F3130 Bipolar disorder, current episode depressed, mild or moderate severity, unspecified: Secondary | ICD-10-CM

## 2022-08-29 ENCOUNTER — Other Ambulatory Visit: Payer: Self-pay | Admitting: Student

## 2022-09-08 ENCOUNTER — Other Ambulatory Visit: Payer: Self-pay | Admitting: Student

## 2022-09-08 ENCOUNTER — Encounter: Payer: Self-pay | Admitting: Student

## 2022-09-09 ENCOUNTER — Other Ambulatory Visit: Payer: Self-pay | Admitting: Student

## 2022-09-09 DIAGNOSIS — G8929 Other chronic pain: Secondary | ICD-10-CM

## 2022-09-09 MED ORDER — METHOCARBAMOL 750 MG PO TABS: ORAL_TABLET | ORAL | 0 refills | Status: DC

## 2022-09-17 ENCOUNTER — Other Ambulatory Visit: Payer: Self-pay | Admitting: Student

## 2022-09-17 ENCOUNTER — Encounter: Payer: Self-pay | Admitting: Student

## 2022-09-17 DIAGNOSIS — K219 Gastro-esophageal reflux disease without esophagitis: Secondary | ICD-10-CM

## 2022-09-17 DIAGNOSIS — M545 Low back pain, unspecified: Secondary | ICD-10-CM

## 2022-09-17 DIAGNOSIS — G43001 Migraine without aura, not intractable, with status migrainosus: Secondary | ICD-10-CM

## 2022-09-17 MED ORDER — OMEPRAZOLE 20 MG PO CPDR: 20.0000 mg | DELAYED_RELEASE_CAPSULE | Freq: Every day | ORAL | 0 refills | Status: DC

## 2022-09-17 MED ORDER — PROPRANOLOL HCL ER 60 MG PO CP24: 60.0000 mg | ORAL_CAPSULE | Freq: Every day | ORAL | 0 refills | Status: DC

## 2022-09-22 ENCOUNTER — Other Ambulatory Visit (HOSPITAL_COMMUNITY): Payer: Self-pay | Admitting: Physician Assistant

## 2022-09-22 DIAGNOSIS — F3130 Bipolar disorder, current episode depressed, mild or moderate severity, unspecified: Secondary | ICD-10-CM

## 2022-09-26 NOTE — Progress Notes (Signed)
NEUROLOGY CONSULTATION NOTE  Mariah Rice MRN: [id number] DOB: May 16, 1971  Referring provider: Talbert Cage, MD Primary care provider: Gerrit Heck, MD  Reason for consult:  migraines  Assessment/Plan:   Migraine without aura, without status migrainosus, not intractable.  Migraine prevention:  Start Aimovig 175m every 28 days.  Discontinue propranolol Migraine rescue:  rizatriptan 175m Zofran for nausea.  Discontinue Tylenol. Limit use of pain relievers to no more than 2 days out of week to prevent risk of rebound or medication-overuse headache. Keep headache diary Caffeine cessation Smoking cessation Follow up 4-5 months.    Subjective:  Mariah Rice a 89ear old female with COPD, HTN and hyperthyroidism who presents for migraines.  History supplemented by referring provider's note.  Onset:  her 30s Location:  starts in back of eyes and spreads to top of head Quality:  pounding Intensity:  severe.   Aura:  absent Prodrome:  absent Associated symptoms:  Photophobia, phonophobia, nausea.  She denies associated unilateral numbness or weakness. Duration:  1 day Frequency:  every 2 days Frequency of abortive medication: Tylenol 4 days a week Triggers:  unknown Relieving factors:  cold rag on forehead and rest in dark room Activity:  cannot function about once a week  MRI of brain with and without contrast on 10/31/2020 personally reviewed was unremarkable.   Also history of neck and right sided cervical radicular pain.  MRI of cervical spine on 12/26/2021 personally reviewed revealed degenerative changes at C5-6 with mild spinal stenosis and severe right/moderate left neural foraminal stenosis as well as mild degenerative changes at C4-5 without  significant spinal canal or neural foraminal stenosis.    Past NSAIDS/analgesics:  Fioricet, ibuprofen, naproxen, diclofenac, tramadol Past abortive triptans:  sumatriptan 10066mast abortive ergotamine:   none Past muscle relaxants:  tizanidine, cyclobenzaprine Past anti-emetic:  promethazine Past antihypertensive medications:  amlodipine, HCTZ Past antidepressant medications:  duloxetine, sertraline, bupropion, citalopram Past anticonvulsant medications:  topiramate, Depakote, gabapentin Past anti-CGRP:  none Past vitamins/Herbal/Supplements:  none Past antihistamines/decongestants:  Benadryl Other past therapies:  none  Current NSAIDS/analgesics:  acetaminophen 1000m43mrrent triptans:  none Current ergotamine:  none Current anti-emetic:  ondansetron 4mg 70mrent muscle relaxants:  methocarbamol 750mg 51ment Antihypertensive medications:  propranolol ER 60mg d81m (80mg ex80mbated cough and concern with COPD) Current Antidepressant medications:  amitriptyline 10mg QHS35meep) Current Anticonvulsant medications:  lamotrigine 100mg dail88mxycarbazepine 600mg BID C65mnt anti-CGRP:  none Current Vitamins/Herbal/Supplements:  none Current Antihistamines/Decongestants:  hydroxyzine, Flonase, Zyrtec Other therapy:  none Hormone/birth control:  estradiol Other medications:  albuterol   Caffeine:  2 cups coffee daily, [place] iced tea  Alcohol:  occasional  Smoker:  1 ppd Diet:  No soda.  Water Exercise:  tries to walk but just had foot surgery Depression:  yes; Anxiety:  yes.  Bipolar disorder Other pain:  neck pain, shoulder pain bilaterally Sleep hygiene:  yes.  Takes amitriptyline Family history of headache:  mom      PAST MEDICAL HISTORY: Past Medical History:  Diagnosis Date   Abnormal finding on GI tract imaging    Anaphylaxis 07/26/2020   Anxiety    COPD (chronic obstructive pulmonary disease) (HCC)    COPD exacerbation (HCC) 08/02/[place]   Dental abscess    Depression    Family history of breast cancer    Family history of breast cancer 01/26/2019   Family history of ovarian cancer    Genetic testing 03/08/2019   Negative genetic testing on the  CancerNext-Expanded+RNAinsight testing.  The CancerNext-Expanded gene panel offered by [facility] and includes sequencing and rearrangement analysis for the following 67 genes: AIP, ALK, APC*, ATM*, BAP1, BARD1, BLM, BMPR1A, BRCA1*, BRCA2*, BRIP1*, CDH1*, CDK4, CDKN1B, CDKN2A, CHEK2*, DICER1, FANCC, FH, FLCN, GALNT12, HOXB13, MAX, MEN1, MET, MLH1*, MRE11A, MSH2*   GERD (gastroesophageal reflux disease)    Headache    migraines   Hypertension    Hyperthyroidism 1990s   PONV (postoperative nausea and vomiting)    Shortness of breath dyspnea    with anxiety   Small bowel obstruction (HCC)    Tick bite of abdomen 05/13/2017    PAST SURGICAL HISTORY: Past Surgical History:  Procedure Laterality Date   BALLOON ENTEROSCOPY  02/07/2022   Procedure: BALLOON ENTEROSCOPY;  Surgeon: Lavena Bullion, DO;  Location: [facility];  Service: Gastroenterology;;   BIOPSY  02/07/2022   Procedure: BIOPSY;  Surgeon: Lavena Bullion, DO;  Location: [facility];  Service: Gastroenterology;;   DILATION AND CURETTAGE OF UTERUS  1999   ENTEROSCOPY N/A 05/12/2019   Procedure: ENTEROSCOPY;  Surgeon: Doran Stabler, MD;  Location: [facility];  Service: Gastroenterology;  Laterality: N/A;   ENTEROSCOPY N/A 02/07/2022   Procedure: ENTEROSCOPY;  Surgeon: Lavena Bullion, DO;  Location: [facility];  Service: Gastroenterology;  Laterality: N/A;  single balloon   FOOT SURGERY Right    INCISE AND DRAIN ABCESS  2005   LAPAROSCOPIC ASSISTED VAGINAL HYSTERECTOMY N/A 07/11/2015   Procedure: LAPAROSCOPIC ASSISTED VAGINAL Total HYSTERECTOMY;  Surgeon: Osborne Oman, MD;  Location: [facility];  Service: Gynecology;  Laterality: N/A;   LAPAROSCOPIC BILATERAL SALPINGECTOMY Bilateral 07/11/2015   Procedure: LAPAROSCOPIC BILATERAL SALPINGO OOPHORECTOMY ;  Surgeon: Osborne Oman, MD;  Location: [place];  Service: Gynecology;  Laterality: Bilateral;   tubal ligation  2000   WISDOM TOOTH EXTRACTION     XI ROBOT  ASSISTED DIAGNOSTIC LAPAROSCOPY N/A 10/16/2021   Procedure: XI ROBOT ASSISTED DIAGNOSTIC LAPAROSCOPY;  Surgeon: Olean Ree, MD;  Location: [facility];  Service: General;  Laterality: N/A;    MEDICATIONS: Current Outpatient Medications on File Prior to Visit  Medication Sig Dispense Refill   acetaminophen (TYLENOL) 500 MG tablet Take 2 tablets (1,000 mg total) by mouth every 6 (six) hours as needed for mild pain.     albuterol (VENTOLIN HFA) 108 (90 Base) MCG/ACT inhaler Inhale 2 puffs into the lungs every 6 (six) hours as needed for wheezing or shortness of breath. 8 g 1   amitriptyline (ELAVIL) 10 MG tablet TAKE 1 TABLET BY MOUTH EVERYDAY AT BEDTIME 90 tablet 1   cetirizine (ZYRTEC) 10 MG tablet TAKE 1 TABLET BY MOUTH EVERY DAY 90 tablet 1   EPINEPHrine 0.3 mg/0.3 mL IJ SOAJ injection Inject 0.3 mg into the muscle as needed for anaphylaxis (for difficulty breathing, throat, tongue or lip swelling. must come to ED after use.). 1 each 0   estradiol (ESTRACE) 2 MG tablet TAKE 1 TABLET BY MOUTH EVERY DAY 90 tablet 1   famotidine (PEPCID) 20 MG tablet TAKE 1 TABLET BY MOUTH TWICE A DAY 180 tablet 1   fluticasone (FLONASE) 50 MCG/ACT nasal spray USE 1 SPRAY IN EACH NOSTRIL ONCE DAILY FOR 14 DAYS (Patient taking differently: Place 1 spray into both nostrils at bedtime.) 48 mL 3   gabapentin (NEURONTIN) 100 MG capsule Take 1 capsule (100 mg total) by mouth 3 (three) times daily. 90 capsule 1   hydrOXYzine (ATARAX) 50 MG tablet Take 1 tablet (50 mg total) by mouth at bedtime as  needed. 30 tablet 0   lamoTRIgine (LAMICTAL) 100 MG tablet Take 1 tablet (100 mg total) by mouth daily. 30 tablet 2   lamoTRIgine (LAMICTAL) 25 MG tablet Take 1 tablet (25 mg total) by mouth daily. For 7 days take 77m (1 tablet) then Increase to 2 tablets (55m after 7 days. 67 tablet 1   methocarbamol (ROBAXIN) 750 MG tablet Take 1 tablet two times daily as needed 60 tablet 0   omeprazole (PRILOSEC) 20 MG capsule Take 1 capsule  (20 mg total) by mouth daily. 90 capsule 0   ondansetron (ZOFRAN) 4 MG tablet TAKE 1 TABLET BY MOUTH EVERY 4 HOURS AS NEEDED FOR NAUSEA OR VOMITING 21 tablet 0   oxcarbazepine (TRILEPTAL) 600 MG tablet Take 1 tablet (600 mg total) by mouth 2 (two) times daily. 60 tablet 2   permethrin (ELIMITE) 5 % cream Apply to affected area once 60 g 0   polyethylene glycol powder (GLYCOLAX/MIRALAX) 17 GM/SCOOP powder Take 17 g by mouth daily. (Patient not taking: Reported on 02/05/2022) 3350 g 1   pravastatin (PRAVACHOL) 40 MG tablet Take 1 tablet (40 mg total) by mouth daily. (Patient taking differently: Take 40 mg by mouth at bedtime.) 90 tablet 3   propranolol ER (INDERAL LA) 60 MG 24 hr capsule Take 1 capsule (60 mg total) by mouth daily. 90 capsule 0   SPIRIVA RESPIMAT 2.5 MCG/ACT AERS Inhale 2 puffs into the lungs daily. 4 g 3   No current facility-administered medications on file prior to visit.    ALLERGIES: Allergies  Allergen Reactions   Amoxicillin Anaphylaxis   Azithromycin Anaphylaxis   Meloxicam Nausea And Vomiting   Sumatriptan Nausea Only, Rash and Other (See Comments)    Throat felt like it was closing up   Penicillins Nausea Only and Rash    Has patient had a PCN reaction causing immediate rash, facial/tongue/throat swelling, SOB or lightheadedness with hypotension: no Has patient had a PCN reaction causing severe rash involving mucus membranes or skin necrosis:unknown Has patient had a PCN reaction that required hospitalization : yes Has patient had a PCN reaction occurring within the last 10 years: no If all of the above answers are "NO", then may proceed with Cephalosporin use.    Sulfa Antibiotics Nausea Only and Rash    FAMILY HISTORY: Family History  Problem Relation Age of Onset   Ovarian cancer Mother 2970     d. 3131 Heart disease Father    Hypertension Father    COPD Father    Hypertension Paternal Aunt 5567 Lung cancer Paternal Aunt 7497 Breast cancer Paternal  AuAunt 33 Thyroid disease Maternal Grandmother    Lymphoma Maternal Grandmother 61       NHL, recurrance at 8580 Lung cancer Maternal Grandmother 61   Hypertension Paternal Grandmother    Heart disease Paternal Grandmother    Heart Problems Paternal Grandmother    Dementia Paternal Grandmother    Colon cancer Neg Hx    Esophageal cancer Neg Hx    Stomach cancer Neg Hx    Pancreatic cancer Neg Hx     Objective:  Blood pressure (!) 152/91, pulse 63, height 5' 4"  (1.626 m), weight 209 lb 3.2 oz (94.9 kg), SpO2 96 %. General: No acute distress.  Patient appears well-groomed.   Head:  Normocephalic/atraumatic Eyes:  fundi examined but not visualized Neck: supple, no paraspinal tenderness, full range of motion Back: No paraspinal tenderness Heart:  regular rate and rhythm Lungs: Clear to auscultation bilaterally. Vascular: No carotid bruits. Neurological Exam: Mental status: alert and oriented to person, place, and time, speech fluent and not dysarthric, language intact. Cranial nerves: CN I: not tested CN II: pupils equal, round and reactive to light, visual fields intact CN III, IV, VI:  full range of motion, no nystagmus, no ptosis CN V: facial sensation intact. CN VII: upper and lower face symmetric CN VIII: hearing intact CN IX, X: gag intact, uvula midline CN XI: sternocleidomastoid and trapezius muscles intact CN XII: tongue midline Bulk & Tone: normal, no fasciculations. Motor:  muscle strength 5/5 throughout Sensation:  Pinprick, temperature and vibratory sensation intact. Deep Tendon Reflexes:  2+ throughout,  toes downgoing.   Finger to nose testing:  Without dysmetria.   Heel to shin:  Without dysmetria.   Gait:  Normal station and stride.  Romberg negative.    Thank you for allowing me to take part in the care of this patient.  Metta Clines, DO  CC:  Talbert Cage, MD  Gerrit Heck, MD

## 2022-09-30 ENCOUNTER — Ambulatory Visit: Payer: 59 | Admitting: Neurology

## 2022-09-30 ENCOUNTER — Encounter: Payer: Self-pay | Admitting: Neurology

## 2022-09-30 VITALS — BP 152/91 | HR 63 | Ht 64.0 in | Wt 209.2 lb

## 2022-09-30 DIAGNOSIS — G43001 Migraine without aura, not intractable, with status migrainosus: Secondary | ICD-10-CM | POA: Diagnosis not present

## 2022-09-30 MED ORDER — RIZATRIPTAN BENZOATE 10 MG PO TBDP: 10.0000 mg | ORAL_TABLET | ORAL | 5 refills | Status: DC | PRN

## 2022-09-30 MED ORDER — AIMOVIG 140 MG/ML ~~LOC~~ SOAJ: 140.0000 mg | SUBCUTANEOUS | 11 refills | Status: DC

## 2022-09-30 NOTE — Patient Instructions (Addendum)
  Start Aimovig '140mg'$  every 28 days Take rizatriptan '10mg'$  at earliest onset of headache.  May repeat dose once in 2 hours if needed.  Maximum 2 tablets in 24 hours. Take Zofran for nausea Limit use of pain relievers to no more than 2 days out of the week.  These medications include acetaminophen, NSAIDs (ibuprofen/Advil/Motrin, naproxen/Aleve, triptans (Imitrex/sumatriptan), Excedrin, and narcotics.  This will help reduce risk of rebound headaches. Be aware of common food triggers:  - Caffeine:  coffee, black tea, cola, Mt. Dew  - Chocolate  - Dairy:  aged cheeses (brie, blue, cheddar, gouda, [place], provolone, [place], Swiss, etc), chocolate milk, buttermilk, sour cream, limit eggs and yogurt  - Nuts, peanut butter  - Alcohol  - Cereals/grains:  FRESH breads (fresh bagels, sourdough, doughnuts), yeast productions  - Processed/canned/aged/cured meats (pre-packaged deli meats, hotdogs)  - MSG/glutamate:  soy sauce, flavor enhancer, pickled/preserved/marinated foods  - Sweeteners:  aspartame (Equal, Nutrasweet).  Sugar and Splenda are okay  - Vegetables:  legumes (lima beans, lentils, snow peas, fava beans, pinto peans, peas, garbanzo beans), sauerkraut, onions, olives, pickles  - Fruit:  avocados, bananas, citrus fruit (orange, lemon, grapefruit), mango  - Other:  Frozen meals, macaroni and cheese Routine exercise Stay adequately hydrated (aim for 64 oz water daily) Keep headache diary Maintain proper stress management Maintain proper sleep hygiene Do not skip meals Consider supplements:  magnesium citrate '400mg'$  daily, riboflavin '400mg'$  daily, coenzyme Q10 '100mg'$  three times daily. Follow up 4-5 months.

## 2022-10-02 ENCOUNTER — Telehealth (HOSPITAL_COMMUNITY): Payer: 59 | Admitting: Physician Assistant

## 2022-10-02 ENCOUNTER — Other Ambulatory Visit: Payer: Self-pay | Admitting: Student

## 2022-10-02 DIAGNOSIS — G8929 Other chronic pain: Secondary | ICD-10-CM
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
## 2022-10-15 NOTE — Patient Instructions (Signed)
Thank you for attending your appointment today.  -- INCREASE lamotrigine to 150 mg daily -- INCREASE amitriptyline to 25 mg nightly -- Continue other medications as prescribed.  Please do not make any changes to medications without first discussing with your provider. If you are experiencing a psychiatric emergency, please call 911 or present to your nearest emergency department. Additional crisis, medication management, and therapy resources are included below.  [facility]  [address] [place], [place] [address] [phone] WALK-IN URGENT CARE 24/7 FOR ANYONE [address], [place], [place]  [phone] Fax: [phone] [email or web address] *Interpreters available *Accepts all insurance and uninsured for Urgent Care needs *Accepts Medicaid and uninsured for outpatient treatment (below)      ONLY FOR [facility]  Below:    Outpatient New Patient Assessment/Therapy Walk-ins:        Monday -Thursday 8am until slots are full.        Every Friday 1pm-4pm  (first come, first served)                   New Patient Psychiatry/Medication Management        Monday-Friday 8am-11am (first come, first served)               For all walk-ins we ask that you arrive by 7:15am, because patients will be seen in the order of arrival.

## 2022-10-16 ENCOUNTER — Telehealth (INDEPENDENT_AMBULATORY_CARE_PROVIDER_SITE_OTHER): Payer: 59 | Admitting: Psychiatry

## 2022-10-16 ENCOUNTER — Encounter (HOSPITAL_COMMUNITY): Payer: Self-pay | Admitting: Psychiatry

## 2022-10-16 DIAGNOSIS — F3130 Bipolar disorder, current episode depressed, mild or moderate severity, unspecified: Secondary | ICD-10-CM

## 2022-10-16 DIAGNOSIS — R69 Illness, unspecified: Secondary | ICD-10-CM | POA: Diagnosis not present

## 2022-10-16 DIAGNOSIS — F411 Generalized anxiety disorder: Secondary | ICD-10-CM

## 2022-10-16 DIAGNOSIS — G479 Sleep disorder, unspecified: Secondary | ICD-10-CM | POA: Insufficient documentation

## 2022-10-16 MED ORDER — LAMOTRIGINE 100 MG PO TABS: 150.0000 mg | ORAL_TABLET | Freq: Every day | ORAL | 2 refills | Status: DC

## 2022-10-16 MED ORDER — AMITRIPTYLINE HCL 25 MG PO TABS: 25.0000 mg | ORAL_TABLET | Freq: Every evening | ORAL | 2 refills | Status: DC

## 2022-10-20 ENCOUNTER — Encounter: Payer: Self-pay | Admitting: Student

## 2022-10-24 ENCOUNTER — Encounter: Payer: Self-pay | Admitting: Student

## 2022-10-29 ENCOUNTER — Other Ambulatory Visit: Payer: Self-pay | Admitting: Student

## 2022-10-29 ENCOUNTER — Telehealth: Payer: Self-pay | Admitting: Anesthesiology

## 2022-10-29 DIAGNOSIS — G8929 Other chronic pain: Secondary | ICD-10-CM

## 2022-10-29 NOTE — Telephone Encounter (Signed)
Patient requests a call back. Has questions about her Aimovig medication.

## 2022-10-29 NOTE — Telephone Encounter (Signed)
LMOVM for patient to call the office back.

## 2022-10-30 ENCOUNTER — Ambulatory Visit: Payer: 59 | Admitting: Podiatry

## 2022-10-30 ENCOUNTER — Encounter: Payer: Self-pay | Admitting: Podiatry

## 2022-10-30 ENCOUNTER — Ambulatory Visit (INDEPENDENT_AMBULATORY_CARE_PROVIDER_SITE_OTHER): Payer: 59

## 2022-10-30 DIAGNOSIS — M722 Plantar fascial fibromatosis: Secondary | ICD-10-CM

## 2022-10-30 DIAGNOSIS — M7671 Peroneal tendinitis, right leg: Secondary | ICD-10-CM | POA: Diagnosis not present

## 2022-10-30 MED ORDER — TRIAMCINOLONE ACETONIDE 10 MG/ML IJ SUSP
10.0000 mg | Freq: Once | INTRAMUSCULAR | Status: AC
  Administered 2022-10-30: 10 mg

## 2022-10-30 NOTE — Telephone Encounter (Signed)
Patient called again for [facility]. She said she did not complete the dose for her last injection, she pulled the needle out too quickly and it squirted everywhere.

## 2022-10-30 NOTE — Progress Notes (Signed)
Subjective:   Patient ID: Mariah Rice, female   DOB: 51 y.o.   MRN: [id number]   HPI Patient presents stating she is developing a lot of pain on the outside of her right foot   ROS      Objective:  Physical Exam  Neurovascular status intact with patient's heel doing very well with inflammation that is developed in the lateral side of the right foot around the peroneal insertion     Assessment:  Possibility for peroneal tendinitis right     Plan:  H&P condition reviewed recommended careful injection explained procedure and risk patient wants this done and I did sterile prep injected the sheath of the peroneal tendon 3 mg Dexasone Kenalog 5 mg Xylocaine advised on ice support and patient will be seen back as needed  X-rays indicate no signs of a fracture or arthritis associated with condition arch height remains good

## 2022-10-31 ENCOUNTER — Telehealth: Payer: Self-pay

## 2022-10-31 NOTE — Telephone Encounter (Signed)
Patient advised to contact the company and advise.  In the meantime while she waits she may come the office to pick up samples.

## 2022-10-31 NOTE — Telephone Encounter (Signed)
Medication Samples have been provided to the patient.  Drug name: amovig       Strength: 140        Qty: 1  LOT: [id number]  Exp.Date: 12/15/22  Dosing instructions: as directed  The patient has been instructed regarding the correct time, dose, and frequency of taking this medication, including desired effects and most common side effects.

## 2022-11-06 ENCOUNTER — Other Ambulatory Visit: Payer: Self-pay | Admitting: Podiatry

## 2022-11-06 MED ORDER — TERBINAFINE HCL 250 MG PO TABS: ORAL_TABLET | ORAL | 0 refills | Status: DC

## 2022-11-10 NOTE — Progress Notes (Signed)
  SUBJECTIVE:   CHIEF COMPLAINT / HPI:   On estradiol after hysterectomy/oophorectomy about 67yr ago for hormone replacement - advised to start weaning this around menopausal age by Dr. WShan Levans Has not had any adverse reactions to it. Mother died of ovarian cancer.  Last took it 2 weeks ago, since then has been having night sweats.   Denies vaginal bleeding. Smokes 1ppd x about 40 years. Uses spiriva for COPD. No drinking. Discussed risks/benefits of continuing therapy and patient is willing to start weaning to '1mg'$  today and see how she does.  Requesting handicap form due to plantar fasciitis in R foot, follows w/ Dr. RPaulla Dolly Reports only being able to walk less than 1 block before having severe foot pain and some trouble breathing due to COPD, has to stop and rest. Denies chest pain with this.  Elevated BP - was well controlled on amlodipine but was dc'd due to leg swelling. Endorses occasional headaches but nothing out of the ordinary. Well controlled on Aimovig injection for headaches. Denies vision changes, CP/SOB    OBJECTIVE:  BP (!) 168/95   Pulse 88   Wt 208 lb (94.3 kg)   LMP  (LMP Unknown) Comment: on Megace  SpO2 100%   BMI 35.70 kg/m   General: NAD, pleasant, able to participate in exam Cardiac: RRR, no murmurs auscultated Respiratory: CTAB, normal WOB Abdomen: soft, non-tender, non-distended, normoactive bowel sounds Extremities: warm and well perfused, no edema or cyanosis Skin: warm and dry, no rashes noted Neuro: alert, no obvious focal deficits, speech normal Psych: Normal affect and mood  ASSESSMENT/PLAN:   H/O bilateral oophorectomy Assessment & Plan: On '2mg'$  estradiol qd for hormone replacement after hysterectomy w/ b/l oophorectomy several years ago. Well controlled on this. Discussed risks/benefits of continuing therapy especially since pt is a smoker. Provided return precautions with regards to DVT/PE. Previously recommended to start weaning estradiol  around menopausal age. Will go down to '1mg'$  estradiol and see how she does for the next 6 months, at which time can consider going down to 0.'5mg'$ .  Orders: -     Estradiol; Take 1 tablet (1 mg total) by mouth daily.  Dispense: 30 tablet; Refill: 3  Primary hypertension Assessment & Plan: 168/95 today after recheck. No red flag symptoms. Was previously on amlodipine which was dc'd due to leg swelling. Will start losartan '25mg'$ . F/u in 2 weeks for BMP check and [place] BP check. Advised to keep BP log at home. Consider checking A1c at next visit.  Orders: -     Basic metabolic panel; Future -     Losartan Potassium; Take 1 tablet (25 mg total) by mouth at bedtime.  Dispense: 90 tablet; Refill: 3   Filled out handicap application form and provided to patient - she is unable to walk >20047fithout having to stop and rest due to her COPD and plantar fasciitis  Meds ordered this encounter  Medications   estradiol (ESTRACE) 1 MG tablet    Sig: Take 1 tablet (1 mg total) by mouth daily.    Dispense:  30 tablet    Refill:  3   losartan (COZAAR) 25 MG tablet    Sig: Take 1 tablet (25 mg total) by mouth at bedtime.    Dispense:  90 tablet    Refill:  3   Return in about 2 weeks (around 11/25/2022) for BMP lab only.  AtiAugust AlbinoD [facility]

## 2022-11-11 ENCOUNTER — Encounter: Payer: Self-pay | Admitting: Family Medicine

## 2022-11-11 ENCOUNTER — Ambulatory Visit (INDEPENDENT_AMBULATORY_CARE_PROVIDER_SITE_OTHER): Payer: 59 | Admitting: Family Medicine

## 2022-11-11 VITALS — BP 168/95 | HR 88 | Wt 208.0 lb

## 2022-11-11 DIAGNOSIS — I10 Essential (primary) hypertension: Secondary | ICD-10-CM | POA: Diagnosis not present

## 2022-11-11 DIAGNOSIS — K219 Gastro-esophageal reflux disease without esophagitis: Secondary | ICD-10-CM | POA: Diagnosis not present

## 2022-11-11 DIAGNOSIS — Z90722 Acquired absence of ovaries, bilateral: Secondary | ICD-10-CM | POA: Diagnosis not present

## 2022-11-11 DIAGNOSIS — J449 Chronic obstructive pulmonary disease, unspecified: Secondary | ICD-10-CM | POA: Diagnosis not present

## 2022-11-11 DIAGNOSIS — R32 Unspecified urinary incontinence: Secondary | ICD-10-CM | POA: Diagnosis not present

## 2022-11-11 DIAGNOSIS — Z720 Tobacco use: Secondary | ICD-10-CM | POA: Diagnosis not present

## 2022-11-11 DIAGNOSIS — R69 Illness, unspecified: Secondary | ICD-10-CM | POA: Diagnosis not present

## 2022-11-11 DIAGNOSIS — E785 Hyperlipidemia, unspecified: Secondary | ICD-10-CM | POA: Diagnosis not present

## 2022-11-11 DIAGNOSIS — Z803 Family history of malignant neoplasm of breast: Secondary | ICD-10-CM | POA: Diagnosis not present

## 2022-11-11 DIAGNOSIS — Z880 Allergy status to penicillin: Secondary | ICD-10-CM | POA: Diagnosis not present

## 2022-11-11 DIAGNOSIS — Z833 Family history of diabetes mellitus: Secondary | ICD-10-CM | POA: Diagnosis not present

## 2022-11-11 DIAGNOSIS — Z8249 Family history of ischemic heart disease and other diseases of the circulatory system: Secondary | ICD-10-CM | POA: Diagnosis not present

## 2022-11-11 DIAGNOSIS — J309 Allergic rhinitis, unspecified: Secondary | ICD-10-CM | POA: Diagnosis not present

## 2022-11-11 MED ORDER — LOSARTAN POTASSIUM 25 MG PO TABS: 25.0000 mg | ORAL_TABLET | Freq: Every day | ORAL | 3 refills | Status: DC

## 2022-11-11 MED ORDER — ESTRADIOL 1 MG PO TABS: 1.0000 mg | ORAL_TABLET | Freq: Every day | ORAL | 3 refills | Status: DC

## 2022-11-11 NOTE — Assessment & Plan Note (Signed)
On '2mg'$  estradiol qd for hormone replacement after hysterectomy w/ b/l oophorectomy several years ago. Well controlled on this. Discussed risks/benefits of continuing therapy especially since pt is a smoker. Provided return precautions with regards to DVT/PE. Previously recommended to start weaning estradiol around menopausal age. Will go down to '1mg'$  estradiol and see how she does for the next 6 months, at which time can consider going down to 0.'5mg'$ .

## 2022-11-11 NOTE — Assessment & Plan Note (Addendum)
168/95 today after recheck. No red flag symptoms. Was previously on amlodipine which was dc'd due to leg swelling. Will start losartan '25mg'$ . F/u in 2 weeks for BMP check and [place] BP check. Advised to keep BP log at home. Consider checking A1c at next visit.

## 2022-11-11 NOTE — Patient Instructions (Signed)
It was wonderful to see you today.  Please bring ALL of your medications with you to every visit.   Updates from today's visit:  Please take the losartan daily for your blood pressure. Please come back in 2 weeks to get your blood drawn, and 1 month to revisit your blood pressure  Please take '1mg'$  of the estradiol instead of '2mg'$ . We can see how this goes for the next 6 months or so. If you start to experience severe night sweats and feel like you need the '2mg'$ , you can give our office a call and we can likely go back up to the '2mg'$ .   Please follow up in 1 month  Thank you for choosing [facility].   Please call [phone] with any questions about today's appointment.  Please be sure to schedule follow up at the front  desk before you leave today.   August Albino, MD  Family Medicine     Blood Pressure Record Sheet To take your blood pressure, you will need a blood pressure machine. You can buy a blood pressure machine (blood pressure monitor) at your clinic, drug store, or online. When choosing one, consider: An automatic monitor that has an arm cuff. A cuff that wraps snugly around your upper arm. You should be able to fit only one finger between your arm and the cuff. A device that stores blood pressure reading results. Do not choose a monitor that measures your blood pressure from your wrist or finger. Follow your health care provider's instructions for how to take your blood pressure. To use this form: Take your blood pressure medications every day These measurements should be taken when you have been at rest for at least 10-15 min Take at least 2 readings with each blood pressure check. This makes sure the results are correct. Wait 1-2 minutes between measurements. Write down the results in the spaces on this form. Keep in mind it should always be recorded systolic over diastolic. Both numbers are important.  Repeat this every day for 2-3 weeks, or as told by  your health care provider.  Make a follow-up appointment with your health care provider to discuss the results.  Blood Pressure Log Date Medications taken? (Y/N) Blood Pressure Time of Day

## 2022-11-12 ENCOUNTER — Other Ambulatory Visit (HOSPITAL_COMMUNITY): Payer: Self-pay | Admitting: Physician Assistant

## 2022-11-12 DIAGNOSIS — F3130 Bipolar disorder, current episode depressed, mild or moderate severity, unspecified: Secondary | ICD-10-CM

## 2022-11-13 ENCOUNTER — Ambulatory Visit (INDEPENDENT_AMBULATORY_CARE_PROVIDER_SITE_OTHER): Payer: 59 | Admitting: Family Medicine

## 2022-11-13 ENCOUNTER — Telehealth: Payer: Self-pay

## 2022-11-13 VITALS — BP 158/74 | HR 75 | Wt 205.0 lb

## 2022-11-13 DIAGNOSIS — I10 Essential (primary) hypertension: Secondary | ICD-10-CM

## 2022-11-13 DIAGNOSIS — Z720 Tobacco use: Secondary | ICD-10-CM | POA: Diagnosis not present

## 2022-11-13 MED ORDER — LOSARTAN POTASSIUM 25 MG PO TABS: 50.0000 mg | ORAL_TABLET | Freq: Every day | ORAL | 3 refills | Status: DC

## 2022-11-13 NOTE — Progress Notes (Signed)
    SUBJECTIVE:   CHIEF COMPLAINT: BP elevation  HPI:   Mariah Rice is a 51 y.o.  with history notable for tobacco use, migraines, and hypertension presenting for follow up.  She is concerned today because she just feels tired.  She feels like she is getting one of her migraines although her migraine medication is working and preventing her from getting the actual migraine.  She is acutely concerned because this morning her blood pressure was in the 170s at home.  She has a home cuff.  She denies headaches, chest pain, dyspnea changes, vision changes.  She reports compliance with her medications which were reviewed.  She endorses some mild nausea which, with her migraine-like symptoms but denies vomiting or other new changes.  The patient currently works at [employer].  Day in and day out she sees grieving families who are suffering the loss of loved ones.  This is very tough on her.  She does have her mom to talk to.  She lives with her boyfriend.  She feels that the stress at work and the constant grief she sees is really affecting her.  No SI or HI.  She is not interested in therapy right now but maybe in the future.Marland Kitchen   PERTINENT  PMH / PSH/Family/Social History : Migraine headaches, hypertension  OBJECTIVE:   BP (!) 158/74   Pulse 75   Wt 205 lb (93 kg)   LMP  (LMP Unknown) Comment: on Megace  SpO2 97%   BMI 35.19 kg/m   Today's weight:  Last Weight  Most recent update: 11/13/2022  1:56 PM    Weight  93 kg (205 lb)            Review of prior weights: Filed Weights   11/13/22 1356  Weight: 205 lb (93 kg)    HEENT PERRL Cardiac: Regular rate and rhythm. Normal S1/S2. No murmurs, rubs, or gallops appreciated. Lungs: Clear bilaterally to ascultation.  Abdomen: Normoactive bowel sounds. No tenderness to deep or light palpation. No rebound or guarding.    Psych: Pleasant and appropriate    ASSESSMENT/PLAN:   Hypertension BP above goal and markedly elevated at  home. Likely needs 2 agents. Discussed smoking cessation, lifestyle changes Increase losartan to 50 mg She will bring cuff to next visit for calibration   Tobacco abuse Cessation resources provided.    Acute stress reaction Supportive listening provided Discussed therapy options Work note given for today and tomorrow  At follow up: consider transitioning to combination with CCB.      Dorris Singh, [place]

## 2022-11-13 NOTE — Assessment & Plan Note (Addendum)
BP above goal and markedly elevated at home. Likely needs 2 agents. Discussed smoking cessation, lifestyle changes Increase losartan to 50 mg She will bring cuff to next visit for calibration

## 2022-11-13 NOTE — Assessment & Plan Note (Signed)
Cessation resources provided.

## 2022-11-13 NOTE — Telephone Encounter (Signed)
Patient calls nurse line regarding continued elevated BP.   She reports this morning BP was 170/104. She has been taking losartan at bedtime since Monday.   Patient is asymptomatic at this time. However, she states that she has a history of migraines and feels like she may be starting to get one.   Spoke with Dr. Joeseph Amor regarding patient. He recommended that she continue current medication regimen and follow up in clinic if she were to become symptomatic or BP does not improve over the next few days.   Discussed with patient. She is asking to be seen today as she is very worried about continued elevated BP readings. Scheduled for appointment this afternoon.   ED precautions discussed.   Talbot Grumbling, RN

## 2022-11-13 NOTE — Patient Instructions (Addendum)
It was wonderful to see you today.  Please bring ALL of your medications with you to every visit.   Today we talked about:  - Please increase your blood pressure pills to two tablets per day   - Bring your blood pressure cuff to your next visit  - We will get labs at your next visit  - For information on therapists, please go to [email or web address]. You can also contact your insurance company to find an in-network therapist.  Tobacco use is damaging to your body. It increases your risk of stroke, heart attack, lung cancer, and serious lung disease in the future. It also reduces your fertility.   Quitting tobacco is the best thing for your health but is a challenge---nicotine, a chemical in cigarettes, is highly addictive.   You can call 1 800 QUIT NOW (1-209 591 8147)---you will be connected with a Artist. They can also mail you nicotine gums, lozenges, and patches to quit.   Ask me about patches (which you wear all day) and gums (which you use when you have a craving) to help you quit.   There are safe, effective medications to help you quit--  Varencline---also called Chantix---- is the most common medication used to help people stop smoking. It starts a low dose and is increased. I recommended choosing a quit date then starting the medication 8 days before this. Side effects include mild headache, difficulty sleeping, and odd dreams. The medication is typically very well tolerated.     Bupropion---also called Zyban---- is started 1 week before your quit date. You take 1 pill for three days then increase to 1 pill twice per day. Side effects include a mild headache and anxiety---this usually goes away. Some patients experience weight loss.      Thank you for choosing [facility].   Please call [phone] with any questions about today's appointment.  Please be sure to schedule follow up at the front  desk before you leave today.   Mariah Singh, MD  Family Medicine

## 2022-11-21 ENCOUNTER — Telehealth: Payer: Self-pay

## 2022-11-21 ENCOUNTER — Encounter: Payer: Self-pay | Admitting: Family Medicine

## 2022-11-21 ENCOUNTER — Ambulatory Visit (INDEPENDENT_AMBULATORY_CARE_PROVIDER_SITE_OTHER): Payer: 59 | Admitting: Family Medicine

## 2022-11-21 VITALS — BP 161/95 | HR 88 | Ht 64.0 in | Wt 206.8 lb

## 2022-11-21 DIAGNOSIS — I10 Essential (primary) hypertension: Secondary | ICD-10-CM | POA: Diagnosis not present

## 2022-11-21 DIAGNOSIS — E871 Hypo-osmolality and hyponatremia: Secondary | ICD-10-CM

## 2022-11-21 DIAGNOSIS — R7303 Prediabetes: Secondary | ICD-10-CM | POA: Diagnosis not present

## 2022-11-21 DIAGNOSIS — E669 Obesity, unspecified: Secondary | ICD-10-CM | POA: Diagnosis not present

## 2022-11-21 LAB — POCT GLYCOSYLATED HEMOGLOBIN (HGB A1C): Hemoglobin A1C: 5.5 % (ref 4.0–5.6)

## 2022-11-21 MED ORDER — AMLODIPINE BESYLATE 5 MG PO TABS: 5.0000 mg | ORAL_TABLET | Freq: Every day | ORAL | 0 refills | Status: DC

## 2022-11-21 NOTE — Assessment & Plan Note (Signed)
BP elevated in the office, patient does not seem to tolerate the losartan due to significant fatigue.  Given that is the only change that has occurred with medications in the last month we will discontinue losartan at this time and instead initiate amlodipine.  Patient is also noted to have hyponatremia, anticipate that CCB would be better alternative at this time. - Discontinue losartan 50 mg daily - Start amlodipine 5 mg, instructed to increase dose to 10 mg if tolerating after 1 week - CMP today

## 2022-11-21 NOTE — Assessment & Plan Note (Signed)
Patient with recent symptoms of polydipsia and polyuria.  A1c was checked to ensure no progression of insulin resistance.  A1c was 5.5, unlikely to be contributing to current symptomology

## 2022-11-21 NOTE — Progress Notes (Signed)
    SUBJECTIVE:   CHIEF COMPLAINT / HPI:   Fatigue and HTN - Started Losartan in the last month and then increased the dose - Has been feeling fatigued since starting the medication - Taking her medications at night - Wakes up multiple times per night, which is baseline  - Has elevated anxiety, which is at baseline. Has a stressful job  - Having increased urination and thirst   PERTINENT  PMH / PSH: Reviewed   OBJECTIVE:   BP (!) 161/95   Pulse 88   Ht '5\' 4"'$  (1.626 m)   Wt 206 lb 12.8 oz (93.8 kg)   LMP  (LMP Unknown) Comment: on Megace  SpO2 99%   BMI 35.50 kg/m   Gen: well-appearing, NAD CV: RRR, no m/r/g appreciated, no peripheral edema Pulm: CTAB, no wheezes/crackles GI: soft, non-tender, non-distended  ASSESSMENT/PLAN:   Hypertension BP elevated in the office, patient does not seem to tolerate the losartan due to significant fatigue.  Given that is the only change that has occurred with medications in the last month we will discontinue losartan at this time and instead initiate amlodipine.  Patient is also noted to have hyponatremia, anticipate that CCB would be better alternative at this time. - Discontinue losartan 50 mg daily - Start amlodipine 5 mg, instructed to increase dose to 10 mg if tolerating after 1 week - CMP today  Prediabetes Patient with recent symptoms of polydipsia and polyuria.  A1c was checked to ensure no progression of insulin resistance.  A1c was 5.5, unlikely to be contributing to current symptomology - Lipid panel today     Rise Patience, [place]

## 2022-11-21 NOTE — Telephone Encounter (Signed)
Patient calls nurse line reporting elevated blood pressures at home.   She reports last night her blood pressure was 168/103 and this morning 173/105. She denies any headaches, vision changes, chest pains or SOB. She reports she just feels terrible.   She reports compliance with her medications.   Patient scheduled this afternoon for evaluation. Patient to bring blood pressure cuff to apt.   Strict ED precautions given.

## 2022-11-21 NOTE — Patient Instructions (Addendum)
Given his significant fatigue after starting losartan, I am going to switch to a different medication called amlodipine.  I want you to start off taking 1 pill (5 mg) daily for the next week, if you feel like you are tolerating the medication okay and your blood pressures are still elevated > 40/90 then I want you to increase it to 2 tablets (10 mg).

## 2022-11-22 ENCOUNTER — Telehealth: Payer: Self-pay | Admitting: Family Medicine

## 2022-11-22 DIAGNOSIS — E871 Hypo-osmolality and hyponatremia: Secondary | ICD-10-CM

## 2022-11-22 LAB — COMPREHENSIVE METABOLIC PANEL
ALT: 12 IU/L (ref 0–32)
AST: 16 IU/L (ref 0–40)
Albumin/Globulin Ratio: 1.7 (ref 1.2–2.2)
Albumin: 4.4 g/dL (ref 3.9–4.9)
Alkaline Phosphatase: 101 IU/L (ref 44–121)
BUN/Creatinine Ratio: 6 — ABNORMAL LOW (ref 9–23)
BUN: 5 mg/dL — ABNORMAL LOW (ref 6–24)
Bilirubin Total: 0.3 mg/dL (ref 0.0–1.2)
CO2: 22 mmol/L (ref 20–29)
Calcium: 9.1 mg/dL (ref 8.7–10.2)
Chloride: 87 mmol/L — ABNORMAL LOW (ref 96–106)
Creatinine, Ser: 0.77 mg/dL (ref 0.57–1.00)
Globulin, Total: 2.6 g/dL (ref 1.5–4.5)
Glucose: 84 mg/dL (ref 70–99)
Potassium: 4.3 mmol/L (ref 3.5–5.2)
Sodium: 124 mmol/L — ABNORMAL LOW (ref 134–144)
Total Protein: 7 g/dL (ref 6.0–8.5)
eGFR: 95 mL/min/{1.73_m2} (ref >59)

## 2022-11-22 LAB — LIPID PANEL
Chol/HDL Ratio: 2.9 ratio (ref 0.0–4.4)
Cholesterol, Total: 181 mg/dL (ref 100–199)
HDL: 63 mg/dL (ref >39)
LDL Chol Calc (NIH): 104 mg/dL — ABNORMAL HIGH (ref 0–99)
Triglycerides: 75 mg/dL (ref 0–149)
VLDL Cholesterol Cal: 14 mg/dL (ref 5–40)

## 2022-11-22 NOTE — Telephone Encounter (Signed)
Opened in error

## 2022-11-22 NOTE — Telephone Encounter (Signed)
Attempted to call patient regarding recent lab results. Patient hyponatremia has worsened to 124, Losartan was stopped at last visit. Patient needs to come in for repeat BMP and a serum osm and urine osm to evaluate the possible causes.   Left message for patient to call and schedule a lab visit next week.    Sophie Quiles, DO

## 2022-11-25 ENCOUNTER — Other Ambulatory Visit: Payer: 59

## 2022-11-27 ENCOUNTER — Encounter: Payer: Self-pay | Admitting: Family Medicine

## 2022-11-28 ENCOUNTER — Ambulatory Visit (INDEPENDENT_AMBULATORY_CARE_PROVIDER_SITE_OTHER): Payer: 59 | Admitting: Family Medicine

## 2022-11-28 ENCOUNTER — Ambulatory Visit: Payer: 59

## 2022-11-28 VITALS — BP 148/86 | HR 89 | Wt 206.0 lb

## 2022-11-28 DIAGNOSIS — Z23 Encounter for immunization: Secondary | ICD-10-CM

## 2022-11-28 DIAGNOSIS — I10 Essential (primary) hypertension: Secondary | ICD-10-CM

## 2022-11-28 DIAGNOSIS — F419 Anxiety disorder, unspecified: Secondary | ICD-10-CM

## 2022-11-28 DIAGNOSIS — R61 Generalized hyperhidrosis: Secondary | ICD-10-CM | POA: Diagnosis not present

## 2022-11-28 DIAGNOSIS — R69 Illness, unspecified: Secondary | ICD-10-CM | POA: Diagnosis not present

## 2022-11-28 DIAGNOSIS — F32A Depression, unspecified: Secondary | ICD-10-CM

## 2022-11-28 MED ORDER — BUSPIRONE HCL 10 MG PO TABS: 10.0000 mg | ORAL_TABLET | Freq: Every day | ORAL | 1 refills | Status: DC

## 2022-11-28 MED ORDER — HYDROCHLOROTHIAZIDE 12.5 MG PO TABS: 12.5000 mg | ORAL_TABLET | Freq: Every day | ORAL | 1 refills | Status: DC

## 2022-11-28 MED ORDER — CLONIDINE HCL 0.1 MG PO TABS: 0.1000 mg | ORAL_TABLET | Freq: Two times a day (BID) | ORAL | 1 refills | Status: DC

## 2022-11-28 NOTE — Patient Instructions (Addendum)
It was wonderful to see you today. Thank you for allowing me to be a part of your care. Below is a short summary of what we discussed at your visit today:  Anxiety Start Buspirone. Take 10 mg once per day. May increase by 10 mg every 1-2 weeks to a max dose of 60 mg per day.  Make sure you tell your psychiatrist about this change.   Blood pressure Continue amlodipine 10 mg every day.  Start HCTZ 12.5 mg daily.  Come back in 1 week for follow up and likely kidney recheck.   Vaccines Today you received the annual flu vaccine. You may experience some residual soreness at the injection site.  Gentle stretches and regular use of that arm will help speed up your recovery.  As the vaccines are giving your immune system a "practice run" against specific infections, you may feel a little under the weather for the next several days.  We recommend rest as needed and hydrating.   Please bring all of your medications to every appointment!  If you have any questions or concerns, please do not hesitate to contact us via phone or MyChart message.   Ezequiel Essex, MD

## 2022-11-28 NOTE — Progress Notes (Signed)
SUBJECTIVE:   CHIEF COMPLAINT / HPI:   Hypertension Evaluated in the office 11/21/2022 by Dr. Casandra Doffing.  At that time, patient reported losartan gave her significant fatigue.  She was started on amlodipine 5 mg (with instruction to increase up to 10 mg). Given her hyponatremia, we are somewhat limited on agents to use.  Today, patient reports that she does not believe the medication is working well.  She has been taking the 5 mg amlodipine as directed, has not been on amlodipine 10 for 2 days.  Home BP logs (from below) indicate hypertension on home cuff.  Please note the home cuff was brought her last appointment and confirmed to be close to the clinic cuff.  Home systolics have run 562-130Q, diastolics 65H-846N.  The patient believes that her elevated blood sugar is likely due to severe anxiety.  She reports longstanding anxiety for years.  She currently sees Dr. Lorenda Peck at [facility] behavioral health for diagnoses including bipolar 1 and PTSD.  She reports that she has been voicing concern for her anxiety for some time now, but does not like this has been addressed.  She reports previously being on Xanax, Klonopin, and Valium at various times (separately, not combined) with some relief.  She reports plan that she is at her wits end and would like some relief from the anxiety.  BP Readings from Last 3 Encounters:  11/28/22 (!) 148/86  11/21/22 (!) 161/95  11/13/22 (!) 158/74       PERTINENT  PMH / PSH: Hypertension, CAD, GERD, prediabetes, bipolar 1 disorder, PTSD, GAD   OBJECTIVE:   BP (!) 148/86   Pulse 89   Wt 206 lb (93.4 kg)   LMP  (LMP Unknown) Comment: on Megace  SpO2 99%   BMI 35.36 kg/m    PHQ-9:     11/21/2022    1:55 PM 11/13/2022    1:57 PM 11/11/2022    8:34 AM  Depression screen PHQ 2/9  Decreased Interest '1 1 1  '$ Down, Depressed, Hopeless 0 1 1  PHQ - 2 Score '1 2 2  '$ Altered sleeping '3 2 2  '$ Tired, decreased energy '3 3 3  '$ Change in appetite '2 1 1   '$ Feeling bad or failure about yourself  0 0 0  Trouble concentrating 0 0 0  Moving slowly or fidgety/restless 1 0 0  Suicidal thoughts 0 0 0  PHQ-9 Score '10 8 8    '$ GAD-7:     11/28/2022    2:30 PM 07/30/2022    2:46 PM 05/31/2022    1:24 PM 05/29/2022    9:15 AM  GAD 7 : Generalized Anxiety Score  Nervous, Anxious, on Edge 3     Control/stop worrying 3     Worry too much - different things 3     Trouble relaxing 3     Restless 3     Easily annoyed or irritable 3     Afraid - awful might happen 3     Total GAD 7 Score 21     Anxiety Difficulty Extremely difficult        Information is confidential and restricted. Go to Review Flowsheets to unlock data.   Physical Exam General: Awake, alert, oriented Cardiovascular: Regular rate and rhythm, S1 and S2 present, no murmurs auscultated Respiratory: Lung fields clear to auscultation bilaterally  ASSESSMENT/PLAN:   Hypertension Remains elevated.  No red flag signs or symptoms today.  Continue amlodipine 10 mg, add clonidine 0.1  mg twice daily. Will obtain BMP today.  Given complaints of palpitations and sweating episodes, will also obtain TSH today.  Last TSH normal 9 or so months ago.  Patient to return in approximately 1 week for recheck and obtaining of hyponatremia labs, to include urine and blood studies.  Anxiety and depression Currently under the care of psychiatry at [facility] behavioral health.  Declined to prescribe benzodiazepines today, would defer to psychiatry for that.  Offered hydroxyzine, patient reports previous trial without much benefit.  Will start BuSpar 10 mg daily, with instructions to taper up to 60 mg slowly.  See AVS for more.     Ezequiel Essex, MD [place]

## 2022-11-29 NOTE — Assessment & Plan Note (Signed)
Currently under the care of psychiatry at [facility] behavioral health.  Declined to prescribe benzodiazepines today, would defer to psychiatry for that.  Offered hydroxyzine, patient reports previous trial without much benefit.  Will start BuSpar 10 mg daily, with instructions to taper up to 60 mg slowly.  See AVS for more.

## 2022-11-29 NOTE — Assessment & Plan Note (Signed)
Remains elevated.  No red flag signs or symptoms today.  Continue amlodipine 10 mg, add clonidine 0.1 mg twice daily. Will obtain BMP today.  Given complaints of palpitations and sweating episodes, will also obtain TSH today.  Last TSH normal 9 or so months ago.  Patient to return in approximately 1 week for recheck and obtaining of hyponatremia labs, to include urine and blood studies.

## 2022-12-03 ENCOUNTER — Other Ambulatory Visit: Payer: Self-pay | Admitting: Family Medicine

## 2022-12-03 ENCOUNTER — Telehealth (HOSPITAL_COMMUNITY): Payer: Self-pay

## 2022-12-03 ENCOUNTER — Other Ambulatory Visit (HOSPITAL_COMMUNITY): Payer: Self-pay | Admitting: Psychiatry

## 2022-12-03 DIAGNOSIS — Z90722 Acquired absence of ovaries, bilateral: Secondary | ICD-10-CM

## 2022-12-03 DIAGNOSIS — F411 Generalized anxiety disorder: Secondary | ICD-10-CM

## 2022-12-03 DIAGNOSIS — F3130 Bipolar disorder, current episode depressed, mild or moderate severity, unspecified: Secondary | ICD-10-CM

## 2022-12-03 MED ORDER — AMITRIPTYLINE HCL 25 MG PO TABS: 25.0000 mg | ORAL_TABLET | Freq: Every evening | ORAL | 2 refills | Status: DC

## 2022-12-05 ENCOUNTER — Other Ambulatory Visit: Payer: Self-pay | Admitting: Family Medicine

## 2022-12-05 DIAGNOSIS — F419 Anxiety disorder, unspecified: Secondary | ICD-10-CM

## 2022-12-05 NOTE — Telephone Encounter (Signed)
Tapering up; 90 day rx inappropriate given gradually increasing dosage

## 2022-12-10 ENCOUNTER — Other Ambulatory Visit: Payer: Self-pay | Admitting: Student

## 2022-12-10 DIAGNOSIS — M545 Low back pain, unspecified: Secondary | ICD-10-CM

## 2022-12-10 NOTE — Progress Notes (Unsigned)
SUBJECTIVE:   CHIEF COMPLAINT / HPI:   BP f/u -Started on clonidine 0.46m BID at last visit due to poor response to losartan. Continues on amlodipine 166mqd. Taking these meds as prescribed. -Noted to have hyponatremia on previous BMP, was told to come in for recheck -Denies recent severe HA, vision changes. Endorses occasional nausea which isn't too abnormal for her   Anxiety, depression -Started on Buspar 1028mt last visit, follows w/ psychiatry. -This has been helping a little, plans to see psychiatrist later today  Nausea -Intermittent, not too bad, has had this before, used to take zofran, sometimes keeps her from eating -requesting more zofran. No vomiting or fevers or abd pain. No changes in BMs  Orthopnea/dyspnea -States since having flu shot, has been using her inhaler a bit more -Also has some mild bilateral leg swelling and usually can't lay flat but this has been going on several years - normal EF in 2021. No worsening of her symptoms recently    PERTINENT  PMH / PSH:   Past Medical History:  Diagnosis Date   Abnormal finding on GI tract imaging    Anaphylaxis 07/26/2020   Anxiety    COPD (chronic obstructive pulmonary disease) (HCC)    [place] [facility]   Dental abscess    Depression    Family history of breast cancer    Family history of breast cancer 01/26/2019   Family history of ovarian cancer    Genetic testing 03/08/2019   Negative genetic testing on the CancerNext-Expanded+RNAinsight testing.  The CancerNext-Expanded gene panel offered by [facility] includes sequencing and rearrangement analysis for the following 67 genes: AIP, ALK, APC*, ATM*, BAP1, BARD1, BLM, BMPR1A, BRCA1*, BRCA2*, BRIP1*, CDH1*, CDK4, CDKN1B, CDKN2A, CHEK2*, DICER1, FANCC, FH, FLCN, GALNT12, HOXB13, MAX, MEN1, MET, MLH1*, MRE11A, MSH2*   GERD (gastroesophageal reflux disease)    Headache    migraines   Hypertension    Hyperthyroidism 1990s   PONV  (postoperative nausea and vomiting)    Shortness of breath dyspnea    with anxiety   Small bowel obstruction (HCC)    Tick bite of abdomen 05/13/2017    OBJECTIVE:  BP 136/78   Pulse 92   Ht _0  (1.626 m)   Wt 211 lb (95.7 kg)   LMP  (LMP Unknown) Comment: on Megace  SpO2 100%   BMI 36.22 kg/m   General: NAD, pleasant, able to participate in exam Cardiac: RRR, no murmurs auscultated Respiratory: CTAB, normal WOB Abdomen: soft, non-tender, non-distended, normoactive bowel sounds Extremities: warm and well perfused, trace b/l lower extremity edema Skin: warm and dry, no rashes noted Neuro: alert, no obvious focal deficits, speech normal Psych: Normal affect and mood  ASSESSMENT/PLAN:   Primary hypertension Assessment & Plan: Appears to be well-controlled overall on clonidine 0.1 mg twice daily and amlodipine 10 mg daily.  136/78 today.  A few elevated symptoms on her home BP log but nothing too concerning.  Will continue on current regimen and follow-up in 2 to 3 months   Bilateral leg edema Assessment & Plan: Not worsening recently; has been present for several years.  Also reporting chronic orthopnea.  On exam, has trace bilateral lower extremity edema with reassuring pulmonary exam.  May be in the setting of venous stasis versus amlodipine use versus less likely CHF.  Echo in 2021 was performed for the same reason and showed normal EF but unable to evaluate for diastolic dysfunction.  Given that her symptoms have not  been worsening recently and her blood pressure is well-controlled, do not feel that we need to change her medications or repeat echo urgently.  However, will check BNP today and if this is elevated may consider repeat echo.  Orders: -     Brain natriuretic peptide  Hyponatremia Assessment & Plan: Noted to have sodium 124 on the last BMP, was requested to have a repeat BMP performed and check her urine and serum osmolality.  Will obtain these today.  Notably,  on further review, patient is on Trileptal which may be contributing.  She was noted to have hyponatremia in the past as well. Patient remains asymptomatic but will ensure that there is no pathologic reason for this  Orders: -     Osmolality, urine -     Osmolality -     Basic metabolic panel  COPD suggested by initial evaluation (HCC) -     Spiriva Respimat; Inhale 2 puffs into the lungs daily.  Dispense: 4 g; Refill: 3  Nausea -     Ondansetron HCl; TAKE 1 TABLET BY MOUTH EVERY 4 HOURS AS NEEDED FOR NAUSEA OR VOMITING  Dispense: 21 tablet; Refill: 0   Meds ordered this encounter  Medications   SPIRIVA RESPIMAT 2.5 MCG/ACT AERS    Sig: Inhale 2 puffs into the lungs daily.    Dispense:  4 g    Refill:  3   ondansetron (ZOFRAN) 4 MG tablet    Sig: TAKE 1 TABLET BY MOUTH EVERY 4 HOURS AS NEEDED FOR NAUSEA OR VOMITING    Dispense:  21 tablet    Refill:  0   Return in about 2 months (around 02/11/2023) for BP f/u.  August Albino, MD [facility]

## 2022-12-10 NOTE — Patient Instructions (Incomplete)
Thank you for attending your appointment today.  -- STOP amitriptyline given ineffectiveness -- START Unisom (doxylamine) 12.5-25 mg nightly as needed before bedtime -- INCREASE Buspar to 15 mg twice daily -- Continue other medications as prescribed. -- We may need to make adjustments to your Trileptal if it is felt this may be the cause of your low sodium; we will plan to address this further at your next visit  Please do not make any changes to medications without first discussing with your provider. If you are experiencing a psychiatric emergency, please call 911 or present to your nearest emergency department. Additional crisis, medication management, and therapy resources are included below.  [facility]  [address], [place], [place] [address] [phone] WALK-IN URGENT CARE 24/7 FOR ANYONE [address], [place], [place]  [phone] Fax: [phone] [email or web address] *Interpreters available *Accepts all insurance and uninsured for Urgent Care needs *Accepts Medicaid and uninsured for outpatient treatment (below)      ONLY FOR [facility]  Below:    Outpatient New Patient Assessment/Therapy Walk-ins:        Monday -Thursday 8am until slots are full.        Every Friday 1pm-4pm  (first come, first served)                   New Patient Psychiatry/Medication Management        Monday-Friday 8am-11am (first come, first served)               For all walk-ins we ask that you arrive by 7:15am, because patients will be seen in the order of arrival.
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
## 2022-12-11 ENCOUNTER — Ambulatory Visit (INDEPENDENT_AMBULATORY_CARE_PROVIDER_SITE_OTHER): Payer: 59 | Admitting: Family Medicine

## 2022-12-11 ENCOUNTER — Encounter: Payer: Self-pay | Admitting: Family Medicine

## 2022-12-11 ENCOUNTER — Telehealth (INDEPENDENT_AMBULATORY_CARE_PROVIDER_SITE_OTHER): Payer: 59 | Admitting: Psychiatry

## 2022-12-11 ENCOUNTER — Encounter (HOSPITAL_COMMUNITY): Payer: Self-pay | Admitting: Psychiatry

## 2022-12-11 VITALS — BP 136/78 | HR 92 | Ht 64.0 in | Wt 211.0 lb

## 2022-12-11 DIAGNOSIS — F3130 Bipolar disorder, current episode depressed, mild or moderate severity, unspecified: Secondary | ICD-10-CM | POA: Diagnosis not present

## 2022-12-11 DIAGNOSIS — I10 Essential (primary) hypertension: Secondary | ICD-10-CM | POA: Diagnosis not present

## 2022-12-11 DIAGNOSIS — R69 Illness, unspecified: Secondary | ICD-10-CM | POA: Diagnosis not present

## 2022-12-11 DIAGNOSIS — R110 Nausea: Secondary | ICD-10-CM | POA: Diagnosis not present

## 2022-12-11 DIAGNOSIS — F419 Anxiety disorder, unspecified: Secondary | ICD-10-CM | POA: Diagnosis not present

## 2022-12-11 DIAGNOSIS — E871 Hypo-osmolality and hyponatremia: Secondary | ICD-10-CM | POA: Diagnosis not present

## 2022-12-11 DIAGNOSIS — R600 Localized edema: Secondary | ICD-10-CM | POA: Diagnosis not present

## 2022-12-11 DIAGNOSIS — J449 Chronic obstructive pulmonary disease, unspecified: Secondary | ICD-10-CM

## 2022-12-11 MED ORDER — DOXYLAMINE SUCCINATE (SLEEP) 25 MG PO TABS: 12.5000 mg | ORAL_TABLET | Freq: Every evening | ORAL | 2 refills | Status: DC | PRN

## 2022-12-11 MED ORDER — BUSPIRONE HCL 15 MG PO TABS: 15.0000 mg | ORAL_TABLET | Freq: Two times a day (BID) | ORAL | 2 refills | Status: DC

## 2022-12-11 MED ORDER — SPIRIVA RESPIMAT 2.5 MCG/ACT IN AERS: 2.0000 | INHALATION_SPRAY | Freq: Every day | RESPIRATORY_TRACT | 3 refills | Status: DC

## 2022-12-11 MED ORDER — ONDANSETRON HCL 4 MG PO TABS: ORAL_TABLET | ORAL | 0 refills | Status: DC

## 2022-12-11 MED ORDER — OXCARBAZEPINE 600 MG PO TABS: 600.0000 mg | ORAL_TABLET | Freq: Two times a day (BID) | ORAL | 2 refills | Status: DC

## 2022-12-11 MED ORDER — LAMOTRIGINE 100 MG PO TABS: 150.0000 mg | ORAL_TABLET | Freq: Every day | ORAL | 2 refills | Status: DC

## 2022-12-11 NOTE — Assessment & Plan Note (Signed)
Not worsening recently; has been present for several years.  Also reporting chronic orthopnea.  On exam, has trace bilateral lower extremity edema with reassuring pulmonary exam.  May be in the setting of venous stasis versus amlodipine use versus less likely CHF.  Echo in 2021 was performed for the same reason and showed normal EF but unable to evaluate for diastolic dysfunction.  Given that her symptoms have not been worsening recently and her blood pressure is well-controlled, do not feel that we need to change her medications or repeat echo urgently.  However, will check BNP today and if this is elevated may consider repeat echo.

## 2022-12-11 NOTE — Assessment & Plan Note (Signed)
Noted to have sodium 124 on the last BMP, was requested to have a repeat BMP performed and check her urine and serum osmolality.  Will obtain these today.  Notably, on further review, patient is on Trileptal which may be contributing.  She was noted to have hyponatremia in the past as well. Patient remains asymptomatic but will ensure that there is no pathologic reason for this

## 2022-12-11 NOTE — Patient Instructions (Signed)
It was wonderful to see you today.  Please bring ALL of your medications with you to every visit.   Updates from today's visit:  I'll call you if anything is abnormal. Otherwise you may get a mychart message or letter  Continue your clonidine and amlodipine - you can take amlodipine ('10mg'$  total) all at once if you want  I"ve sent in some Zofran for your nausea - try to limit how much you use this  Sent in refills of Robaxin and Spiriva  Please let your psychiatrist know about clonidine interaction with amitriptyline and see if there's a better option for you  Please follow up in 2-3 months   Thank you for choosing [facility].   Please call [phone] with any questions about today's appointment.  Please be sure to schedule follow up at the front  desk before you leave today.   August Albino, MD  Family Medicine

## 2022-12-11 NOTE — Assessment & Plan Note (Signed)
Appears to be well-controlled overall on clonidine 0.1 mg twice daily and amlodipine 10 mg daily.  136/78 today.  A few elevated symptoms on her home BP log but nothing too concerning.  Will continue on current regimen and follow-up in 2 to 3 months

## 2022-12-12 ENCOUNTER — Encounter: Payer: Self-pay | Admitting: Family Medicine

## 2022-12-12 LAB — BASIC METABOLIC PANEL
BUN/Creatinine Ratio: 13 (ref 9–23)
BUN: 9 mg/dL (ref 6–24)
CO2: 21 mmol/L (ref 20–29)
Calcium: 8.8 mg/dL (ref 8.7–10.2)
Chloride: 94 mmol/L — ABNORMAL LOW (ref 96–106)
Creatinine, Ser: 0.67 mg/dL (ref 0.57–1.00)
Glucose: 82 mg/dL (ref 70–99)
Potassium: 4.3 mmol/L (ref 3.5–5.2)
Sodium: 131 mmol/L — ABNORMAL LOW (ref 134–144)
eGFR: 107 mL/min/{1.73_m2} (ref >59)

## 2022-12-12 LAB — OSMOLALITY, URINE: Osmolality, Ur: 553 mOsmol/kg

## 2022-12-12 LAB — OSMOLALITY: Osmolality Meas: 263 mOsmol/kg — ABNORMAL LOW (ref 275–295)

## 2022-12-12 LAB — BRAIN NATRIURETIC PEPTIDE: BNP: 5.6 pg/mL (ref 0.0–100.0)

## 2022-12-13 ENCOUNTER — Other Ambulatory Visit: Payer: Self-pay | Admitting: Student

## 2022-12-13 DIAGNOSIS — K219 Gastro-esophageal reflux disease without esophagitis: Secondary | ICD-10-CM

## 2022-12-14 ENCOUNTER — Other Ambulatory Visit: Payer: Self-pay | Admitting: Family Medicine

## 2022-12-20 ENCOUNTER — Other Ambulatory Visit: Payer: Self-pay | Admitting: Family Medicine

## 2022-12-20 DIAGNOSIS — I10 Essential (primary) hypertension: Secondary | ICD-10-CM

## 2023-01-08 ENCOUNTER — Other Ambulatory Visit: Payer: Self-pay | Admitting: Family Medicine

## 2023-01-08 DIAGNOSIS — F419 Anxiety disorder, unspecified: Secondary | ICD-10-CM

## 2023-01-08 DIAGNOSIS — G8929 Other chronic pain: Secondary | ICD-10-CM

## 2023-01-22 ENCOUNTER — Encounter (HOSPITAL_COMMUNITY): Payer: Self-pay | Admitting: Physician Assistant

## 2023-01-22 ENCOUNTER — Telehealth (INDEPENDENT_AMBULATORY_CARE_PROVIDER_SITE_OTHER): Payer: 59 | Admitting: Physician Assistant

## 2023-01-22 DIAGNOSIS — R69 Illness, unspecified: Secondary | ICD-10-CM | POA: Diagnosis not present

## 2023-01-22 DIAGNOSIS — F3130 Bipolar disorder, current episode depressed, mild or moderate severity, unspecified: Secondary | ICD-10-CM

## 2023-01-22 DIAGNOSIS — G479 Sleep disorder, unspecified: Secondary | ICD-10-CM | POA: Diagnosis not present

## 2023-01-22 DIAGNOSIS — F419 Anxiety disorder, unspecified: Secondary | ICD-10-CM

## 2023-01-22 MED ORDER — DOXYLAMINE SUCCINATE (SLEEP) 25 MG PO TABS: 12.5000 mg | ORAL_TABLET | Freq: Every evening | ORAL | 3 refills | Status: DC | PRN

## 2023-01-22 MED ORDER — LAMOTRIGINE 100 MG PO TABS: 150.0000 mg | ORAL_TABLET | Freq: Every day | ORAL | 3 refills | Status: DC

## 2023-01-22 MED ORDER — BUSPIRONE HCL 15 MG PO TABS: 22.5000 mg | ORAL_TABLET | Freq: Two times a day (BID) | ORAL | 3 refills | Status: DC

## 2023-01-22 MED ORDER — OXCARBAZEPINE 600 MG PO TABS: 600.0000 mg | ORAL_TABLET | Freq: Two times a day (BID) | ORAL | 3 refills | Status: DC
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
## 2023-01-30 ENCOUNTER — Other Ambulatory Visit: Payer: Self-pay | Admitting: Student

## 2023-01-30 DIAGNOSIS — Z90722 Acquired absence of ovaries, bilateral: Secondary | ICD-10-CM

## 2023-01-31 ENCOUNTER — Other Ambulatory Visit: Payer: Self-pay | Admitting: Family Medicine

## 2023-01-31 DIAGNOSIS — R110 Nausea: Secondary | ICD-10-CM

## 2023-02-06 ENCOUNTER — Ambulatory Visit (INDEPENDENT_AMBULATORY_CARE_PROVIDER_SITE_OTHER): Payer: 59 | Admitting: Family Medicine

## 2023-02-06 ENCOUNTER — Encounter: Payer: Self-pay | Admitting: Family Medicine

## 2023-02-06 ENCOUNTER — Other Ambulatory Visit: Payer: Self-pay | Admitting: Family Medicine

## 2023-02-06 VITALS — BP 136/84 | HR 76 | Ht 64.0 in | Wt 214.6 lb

## 2023-02-06 DIAGNOSIS — Z6836 Body mass index (BMI) 36.0-36.9, adult: Secondary | ICD-10-CM | POA: Diagnosis not present

## 2023-02-06 DIAGNOSIS — E871 Hypo-osmolality and hyponatremia: Secondary | ICD-10-CM | POA: Diagnosis not present

## 2023-02-06 DIAGNOSIS — I10 Essential (primary) hypertension: Secondary | ICD-10-CM

## 2023-02-06 MED ORDER — WEGOVY 0.25 MG/0.5ML ~~LOC~~ SOAJ: 0.2500 mg | SUBCUTANEOUS | 0 refills | Status: DC

## 2023-02-06 NOTE — Assessment & Plan Note (Signed)
Stable on current regimen, continue amlodipine 35m qd and clonidine 0.184mBID

## 2023-02-06 NOTE — Patient Instructions (Signed)
It was wonderful to see you today.  Please bring ALL of your medications with you to every visit.   Updates from today's visit:  Continue your current blood pressure medication  I have sent in a prescription for Wegovy - hopefully your insurance covers this  I will give you a call if your results from today are abnormal  Please follow up in 1 month  Thank you for choosing [facility].   Please call [phone] with any questions about today's appointment.  Please be sure to schedule follow up at the front  desk before you leave today.   August Albino, MD  Family Medicine

## 2023-02-06 NOTE — Telephone Encounter (Signed)
Called patient to discuss that Mariah Rice would not be covered by her insurance.  She is still interested in speaking with our nutritionist Dr. Jenne Campus and is also interested in receiving a referral to the [facility] program.  I will message Dr. Jenne Campus to set up an appointment with her and also placed a referral for the exercise program.

## 2023-02-06 NOTE — Assessment & Plan Note (Signed)
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  Saw her psychiatry office earlier this month who recommended rechecking sodium at today's visit.  Will obtain BMP

## 2023-02-06 NOTE — Addendum Note (Signed)
Addended byJoeseph Amor, Emmaline Wahba on: 02/06/2023 05:09 PM   Modules accepted: Orders

## 2023-02-06 NOTE — Assessment & Plan Note (Signed)
Patient has a history of obesity resulting in significant comorbidity (hypertension, HLD) refractory to lifestyle and dietary changes.  Her obesity is also contributing to poor emotional wellbeing in the setting of her chronic bipolar disorder and anxiety.  She does not have diabetes (A1c 5.5 in December 2023) but I feel she may benefit from [facility].  Discussed risks/benefits of this medication.  Patient denies personal or family history of thyroid cancer/MEN syndrome.  Will start 0.25 mg weekly and follow-up in 1 month

## 2023-02-06 NOTE — Progress Notes (Signed)
  SUBJECTIVE:   CHIEF COMPLAINT / HPI:   Here for follow-up after visit on 12/11/2022: At that time, was continued on current hypertension regimen (clonidine 0.1 mg twice daily and amlodipine 10 mg daily).  Denies significant side effects including worsening leg swelling, dizziness, syncope, sedation  Also with hx of hyponatremia likely 2/2 Trileptal use - recently saw psychiatry who recommended rechecking sodium at today's visit.  Interested in discussing weight loss medication -Has tried dietary changes for a couple of years now (cutting down on fried foods, baking/grilling instead, eating salads, eating lean meats). Usually eating one average sized meal during the day -On her feet most of the day at work. Hard to exercise outside of work especially with foot pain -Interested in medications like Wegovy or Ozempic  Overall, feeling well but wants to work on weight loss because it has a significant impact on her day-to-day life and emotional wellbeing  PERTINENT  PMH / PSH: HTN, anxiety, bipolar I disorder   OBJECTIVE:  BP 136/84   Pulse 76   Ht 5' 4"$  (1.626 m)   Wt 214 lb 9.6 oz (97.3 kg)   LMP  (LMP Unknown) Comment: on Megace  SpO2 100%   BMI 36.84 kg/m   General: NAD, pleasant, able to participate in exam Cardiac: RRR, no murmurs auscultated Respiratory: CTAB, normal WOB Abdomen: soft, non-tender, non-distended, normoactive bowel sounds Extremities: warm and well perfused, trace bilateral lower extremity edema stable from prior exam Skin: warm and dry, no rashes noted Neuro: alert, no obvious focal deficits, speech normal Psych: Normal affect and mood  ASSESSMENT/PLAN:   Class 2 severe obesity with serious comorbidity and body mass index (BMI) of 36.0 to 36.9 in adult, unspecified obesity type [facility]) Assessment & Plan: Patient has a history of obesity resulting in significant comorbidity (hypertension, HLD) refractory to lifestyle and dietary changes.  Her obesity is also  contributing to poor emotional wellbeing in the setting of her chronic bipolar disorder and anxiety.  She does not have diabetes (A1c 5.5 in December 2023) but I feel she may benefit from [facility].  Discussed risks/benefits of this medication.  Patient denies personal or family history of thyroid cancer/MEN syndrome.  Will start 0.25 mg weekly and follow-up in 1 month  Orders: [id number]; Inject 0.25 mg into the skin once a week.  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  Saw her psychiatry office earlier this month who recommended rechecking sodium at today's visit.  Will obtain BMP  Orders: -     Basic metabolic panel  Primary hypertension Assessment & Plan: Stable on current regimen, continue amlodipine 44m qd and clonidine 0.172mBID    Meds ordered this encounter  Medications   Semaglutide-Weight Management (WEGOVY) 0.25 MG/0.5ML SOAJ    Sig: Inject 0.25 mg into the skin once a week.    Dispense:  2 mL    Refill:  0   Return in about 1 month (around 03/07/2023) for weight loss.  AtAugust AlbinoMD [facility]

## 2023-02-07 ENCOUNTER — Telehealth: Payer: Self-pay

## 2023-02-07 LAB — BASIC METABOLIC PANEL
BUN/Creatinine Ratio: 13 (ref 9–23)
BUN: 11 mg/dL (ref 6–24)
CO2: 22 mmol/L (ref 20–29)
Calcium: 9 mg/dL (ref 8.7–10.2)
Chloride: 99 mmol/L (ref 96–106)
Creatinine, Ser: 0.85 mg/dL (ref 0.57–1.00)
Glucose: 88 mg/dL (ref 70–99)
Potassium: 4.7 mmol/L (ref 3.5–5.2)
Sodium: 136 mmol/L (ref 134–144)
eGFR: 84 mL/min/{1.73_m2} (ref >59)

## 2023-02-07 NOTE — Telephone Encounter (Signed)
VMT pt requesting call back to discuss referral to PREP 

## 2023-02-11 ENCOUNTER — Other Ambulatory Visit: Payer: Self-pay | Admitting: Family Medicine

## 2023-02-11 DIAGNOSIS — M545 Low back pain, unspecified: Secondary | ICD-10-CM

## 2023-02-12 ENCOUNTER — Ambulatory Visit: Admit: 2023-02-12 | Discharge status: Absent without leave | Payer: 59

## 2023-02-13 ENCOUNTER — Other Ambulatory Visit: Payer: Self-pay

## 2023-02-13 ENCOUNTER — Ambulatory Visit (INDEPENDENT_AMBULATORY_CARE_PROVIDER_SITE_OTHER): Payer: 59

## 2023-02-13 ENCOUNTER — Ambulatory Visit
Admission: EM | Admit: 2023-02-13 | Discharge: 2023-02-13 | Disposition: A | Discharge status: Home / Self Care | Payer: 59 | Attending: Family Medicine | Admitting: Family Medicine

## 2023-02-13 VITALS — BP 159/95 | HR 70 | Temp 98.0°F | Resp 20

## 2023-02-13 DIAGNOSIS — M79641 Pain in right hand: Secondary | ICD-10-CM

## 2023-02-13 DIAGNOSIS — M79644 Pain in right finger(s): Secondary | ICD-10-CM | POA: Diagnosis not present

## 2023-02-13 DIAGNOSIS — S60221A Contusion of right hand, initial encounter: Secondary | ICD-10-CM

## 2023-02-13 DIAGNOSIS — M7989 Other specified soft tissue disorders: Secondary | ICD-10-CM | POA: Diagnosis not present
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
## 2023-02-13 NOTE — Discharge Instructions (Signed)
Rest, ice, elevation, compression wrap, ibuprofen as needed

## 2023-02-19 ENCOUNTER — Encounter: Payer: Self-pay | Admitting: Dietician

## 2023-02-19 ENCOUNTER — Encounter: Payer: 59 | Attending: Family Medicine | Admitting: Dietician

## 2023-02-19 DIAGNOSIS — Z713 Dietary counseling and surveillance: Secondary | ICD-10-CM | POA: Diagnosis not present

## 2023-02-19 DIAGNOSIS — Z6836 Body mass index (BMI) 36.0-36.9, adult: Secondary | ICD-10-CM | POA: Insufficient documentation

## 2023-02-19 NOTE — Progress Notes (Signed)
Medical Nutrition Therapy  Appointment Start time:  1000  Appointment End time:  [id number]  Primary concerns today: Pt states she wants to learn how to eat healthier.    Referral diagnosis: E66.01 Preferred learning style: no preference indicated Learning readiness: ready   NUTRITION ASSESSMENT   Anthropometrics  Ht: 64 in Wt: 209.6 lbs  Clinical Medical Hx: anxiety, COPD, depression, GERD, HTN, thyroid disease Medications: reviewed Labs: reviewed Notable Signs/Symptoms: none reported Food Allergies: none  Lifestyle & Dietary Hx  Pt states she gave up sodas 2 years ago but she still drinks [place] tea daily or sweet tea at home.   Pt states her doctor was going to put her on Ozempic but it was not approved.  Pt works 9am-5pm at a [employer]. Pt states she has high stress at work. To help relieve her stress she likes to go to her fathers grave.   Pt states sometimes she doesn't feel like eating breakfast but over the last week she has been eating a banana for breakfast.   Pt reports she notices her reflux is worse if she goes to bed with a full stomach, which happens on the weekend because her partner cooks dinner late.    Pt reports she has family hx of diabetes and she wants to prevent this.   Pt states she has a dog she wants to start walking.  Estimated daily fluid intake: 16 oz Supplements: none Sleep: 7-9 hours Stress / self-care: moderate-to-high stress,  Current average weekly physical activity: ADLs  24-Hr Dietary Recall First Meal: banana OR skips Snack: none OR crackers OR apple and peanut butter Second Meal: sandwich and chips OR skips Snack: nabs crackers  Third Meal: fried chicken and mashed potatoes/mac and cheese OR pork tenderloin and scalloped potatoes Snack: 5 mini hersheys bar Beverages: sweet tea, 20 oz [place] tea, coffee with 1 spoon sugar, 16 oz water.    NUTRITION DIAGNOSIS  NB-1.1 Food and nutrition-related knowledge deficit As related to lack  of prior nutrition education by a nutrition professional.  As evidenced by pt report.   NUTRITION INTERVENTION  Nutrition education (E-1) on the following topics:  Need for carbohydrates in the body and for the brain MyPlate Building balanced meals and snacks Consistent meal times and avoiding skipping meals Options for exercise and physical activity benefits/goals Reading nutrient label for added sugar Hydration needs and impact on blood sugar/blood pressure Whole vs refined grains  Handouts Provided Include  Dish Up a Healthy Meal Meal Ideas  Learning Style & Readiness for Change Teaching method utilized: Visual & Auditory  Demonstrated degree of understanding via: Teach Back  Barriers to learning/adherence to lifestyle change: none  Goals Established by Pt Goal: walk the dog for 10-15 minutes 4x/wk.   Goal: drink 2 water bottles daily, try to have 1 by lunch and 1 by the end of the day.    MONITORING & EVALUATION Dietary intake, weekly physical activity, and follow up in 3 months.  Next Steps  Patient is to call for questions.

## 2023-02-19 NOTE — Patient Instructions (Addendum)
Aim for 150 minutes of physical activity weekly. Make physical activity a part of your week. Try to include at least 30 minutes of physical activity 5 days each week or at least 150 minutes per week. Regular physical activity promotes overall health-including helping to reduce risk for heart disease and diabetes, promoting mental health, and helping [place] sleep better.     Goal: walk the dog for 10-15 minutes 4x/wk.   Goal: drink 2 water bottles daily, try to have 1 by lunch and 1 by the end of the day.   Aim to make 1/2 of your plate vegetables and/or fruit at least 2x/day  Rethink what you drink. Aim to reduce your sweet tea intake. Choose beverages without added sugar. Look for 0 carbs on the label.  Aim to eat within 1-2 hours of waking up and every 3-5 hours following. Avoid skipping meals.

## 2023-02-20 ENCOUNTER — Encounter: Payer: Self-pay | Admitting: Family Medicine

## 2023-03-01 ENCOUNTER — Other Ambulatory Visit (HOSPITAL_COMMUNITY): Payer: Self-pay | Admitting: Physician Assistant

## 2023-03-01 DIAGNOSIS — F419 Anxiety disorder, unspecified: Secondary | ICD-10-CM

## 2023-03-04 ENCOUNTER — Other Ambulatory Visit: Payer: Self-pay | Admitting: Family Medicine

## 2023-03-04 ENCOUNTER — Encounter: Payer: Self-pay | Admitting: Family Medicine

## 2023-03-04 MED ORDER — WEGOVY 0.25 MG/0.5ML ~~LOC~~ SOAJ: 0.2500 mg | SUBCUTANEOUS | 0 refills | Status: DC

## 2023-03-05 ENCOUNTER — Other Ambulatory Visit (HOSPITAL_COMMUNITY): Payer: Self-pay

## 2023-03-06 NOTE — Progress Notes (Addendum)
NEUROLOGY FOLLOW UP OFFICE NOTE  Mariah Rice [id number]  Assessment/Plan:   Migraine without aura, without status migrainosus, not intractable. Doing well but since blood pressure has been difficult to control, will change from Aimovig to Emgality Hypertension   Migraine prevention:  Stop Aimovig.  Start Emgality Migraine rescue:  rizatriptan 10mg ; Zofran for nausea.  Limit use of pain relievers to no more than 2 days out of week to prevent risk of rebound or medication-overuse headache. Keep headache diary 5 months       Subjective:  Mariah Rice is a 52 year old female with COPD, HTN and hyperthyroidism who follows up for migraines.  UPDATE: Started Aimovig.   Doing much better.  However, insurance no longer prefers Aimovig  Intensity:  mild Duration:  5 minutes (doesn't need rizatriptan) - doesn't progress to full blown headache Frequency:  once every 2 weeks Frequency of abortive medication: none Current NSAIDS/analgesics:  none Current triptans:  rizatriptan 10mg  Current ergotamine:  none Current anti-emetic:  ondansetron 4mg  Current muscle relaxants:  methocarbamol 750mg  Current Antihypertensive medications:  none Current Antidepressant medications:  amitriptyline 10mg  QHS (sleep) Current Anticonvulsant medications:  lamotrigine 100mg  daily, oxycarbazepine 600mg  BID Current anti-CGRP:  Aimovig 140mg  Current Vitamins/Herbal/Supplements:  none Current Antihistamines/Decongestants:  hydroxyzine, Flonase, Zyrtec Other therapy:  none Hormone/birth control:  estradiol Other medications:  albuterol     Caffeine:  2 cups coffee daily, [place] iced tea  Alcohol:  occasional  Smoker:  1 ppd Diet:  No soda.  Water Exercise:  tries to walk but just had foot surgery Depression:  yes; Anxiety:  yes.  Bipolar disorder Other pain:  neck pain, shoulder pain bilaterally Sleep hygiene:  yes.  Takes amitriptyline  HISTORY:  Onset:  her 30s Location:  starts in back  of eyes and spreads to top of head Quality:  pounding Intensity:  severe.   Aura:  absent Prodrome:  absent Associated symptoms:  Photophobia, phonophobia, nausea.  She denies associated unilateral numbness or weakness. Duration:  1 day Frequency:  every 2 days Frequency of abortive medication: Tylenol 4 days a week Triggers:  unknown Relieving factors:  cold rag on forehead and rest in dark room Activity:  cannot function about once a week   MRI of brain with and without contrast on 10/31/2020 personally reviewed was unremarkable.   Also history of neck and right sided cervical radicular pain.  MRI of cervical spine on 12/26/2021 personally reviewed revealed degenerative changes at C5-6 with mild spinal stenosis and severe right/moderate left neural foraminal stenosis as well as mild degenerative changes at C4-5 without  significant spinal canal or neural foraminal stenosis.     Past NSAIDS/analgesics:  Fioricet, ibuprofen, naproxen, acetaminophen, diclofenac, tramadol Past abortive triptans:  sumatriptan 100mg  Past abortive ergotamine:  none Past muscle relaxants:  tizanidine, cyclobenzaprine Past anti-emetic:  promethazine Past antihypertensive medications:  propranolol ER 60mg  daily (80mg  exacerbated cough and concern with COPD), amlodipine, HCTZ Past antidepressant medications:  duloxetine, sertraline, bupropion, citalopram Past anticonvulsant medications:  topiramate, Depakote, gabapentin Past anti-CGRP:  none Past vitamins/Herbal/Supplements:  none Past antihistamines/decongestants:  Benadryl Other past therapies:  none    Family history of headache:  mom  PAST MEDICAL HISTORY: Past Medical History:  Diagnosis Date   Abnormal finding on GI tract imaging    Anaphylaxis 07/26/2020   Anxiety    COPD (chronic obstructive pulmonary disease) (HCC)    COPD exacerbation (HCC) 07/17/2020   Dental abscess    Depression    Family  history of breast cancer    Family history of  breast cancer 01/26/2019   Family history of ovarian cancer    Genetic testing 03/08/2019   Negative genetic testing on the CancerNext-Expanded+RNAinsight testing.  The CancerNext-Expanded gene panel offered by [employer] and includes sequencing and rearrangement analysis for the following 67 genes: AIP, ALK, APC*, ATM*, BAP1, BARD1, BLM, BMPR1A, BRCA1*, BRCA2*, BRIP1*, CDH1*, CDK4, CDKN1B, CDKN2A, CHEK2*, DICER1, FANCC, FH, FLCN, GALNT12, HOXB13, MAX, MEN1, MET, MLH1*, MRE11A, MSH2*   GERD (gastroesophageal reflux disease)    Headache    migraines   Hypertension    Hyperthyroidism 1990s   PONV (postoperative nausea and vomiting)    Shortness of breath dyspnea    with anxiety   Small bowel obstruction (HCC)    Tick bite of abdomen 05/13/2017    MEDICATIONS: Current Outpatient Medications on File Prior to Visit  Medication Sig Dispense Refill   albuterol (VENTOLIN HFA) 108 (90 Base) MCG/ACT inhaler Inhale 2 puffs into the lungs every 6 (six) hours as needed for wheezing or shortness of breath. 8 g 1   amLODipine (NORVASC) 5 MG tablet TAKE 1 TAB DAILY. INCREASE TO 2 TABLETS (10MG ) DAILY IF BLOOD PRESSURE >140/90 AFTER 1 WEEK. 180 tablet 1   cetirizine (ZYRTEC) 10 MG tablet TAKE 1 TABLET BY MOUTH EVERY DAY 90 tablet 1   cloNIDine (CATAPRES) 0.1 MG tablet TAKE 1 TABLET BY MOUTH 2 TIMES DAILY. 180 tablet 1   EPINEPHrine 0.3 mg/0.3 mL IJ SOAJ injection Inject 0.3 mg into the muscle as needed for anaphylaxis (for difficulty breathing, throat, tongue or lip swelling. must come to ED after use.). 1 each 0   estradiol (ESTRACE) 1 MG tablet TAKE 1 TABLET BY MOUTH EVERY DAY 90 tablet 3   fluticasone (FLONASE) 50 MCG/ACT nasal spray USE 1 SPRAY IN EACH NOSTRIL ONCE DAILY FOR 14 DAYS (Patient taking differently: Place 1 spray into both nostrils at bedtime.) 48 mL 3   lamoTRIgine (LAMICTAL) 100 MG tablet Take 1.5 tablets (150 mg total) by mouth daily. 45 tablet 3   methocarbamol (ROBAXIN) 750 MG  tablet TAKE 1 TABLET TWO TIMES DAILY AS NEEDED 60 tablet 0   omeprazole (PRILOSEC) 20 MG capsule TAKE 1 CAPSULE BY MOUTH EVERY DAY 30 capsule 2   oxcarbazepine (TRILEPTAL) 600 MG tablet Take 1 tablet (600 mg total) by mouth 2 (two) times daily. 60 tablet 3   pravastatin (PRAVACHOL) 40 MG tablet Take 1 tablet (40 mg total) by mouth daily. (Patient taking differently: Take 40 mg by mouth at bedtime.) 90 tablet 3   Semaglutide-Weight Management (WEGOVY) 0.25 MG/0.5ML SOAJ Inject 0.25 mg into the skin once a week. 2 mL 0   SPIRIVA RESPIMAT 2.5 MCG/ACT AERS Inhale 2 puffs into the lungs daily. 4 g 3   busPIRone (BUSPAR) 15 MG tablet Take 1.5 tablets (22.5 mg total) by mouth 2 (two) times daily. (Patient not taking: Reported on 03/10/2023) 90 tablet 3   No current facility-administered medications on file prior to visit.     ALLERGIES: Allergies  Allergen Reactions   Amoxicillin Anaphylaxis   Azithromycin Anaphylaxis   Meloxicam Nausea And Vomiting   Sumatriptan Nausea Only, Rash and Other (See Comments)    Throat felt like it was closing up   Penicillins Nausea Only and Rash    Has patient had a PCN reaction causing immediate rash, facial/tongue/throat swelling, SOB or lightheadedness with hypotension: no Has patient had a PCN reaction causing severe rash involving mucus membranes or  skin necrosis:unknown Has patient had a PCN reaction that required hospitalization : yes Has patient had a PCN reaction occurring within the last 10 years: no If all of the above answers are "NO", then may proceed with Cephalosporin use.    Sulfa Antibiotics Nausea Only and Rash    FAMILY HISTORY: Family History  Problem Relation Age of Onset   Ovarian cancer Mother 73       d. 37   Heart disease Father    Hypertension Father    COPD Father    Hypertension Paternal Aunt 51   Lung cancer Paternal Aunt 44   Breast cancer Paternal Aunt 51   Thyroid disease Maternal Grandmother    Lymphoma Maternal  Grandmother 38       NHL, recurrance at 35   Lung cancer Maternal Grandmother 61   Hypertension Paternal Grandmother    Heart disease Paternal Grandmother    Heart Problems Paternal Grandmother    Dementia Paternal Grandmother    Colon cancer Neg Hx    Esophageal cancer Neg Hx    Stomach cancer Neg Hx    Pancreatic cancer Neg Hx       Objective:  Blood pressure (!) 148/87, pulse 70, resp. rate 18, height  (1.626 m), weight 212 lb (96.2 kg), SpO2 97 %. General: No acute distress.  Patient appears well-groomed.      Shon Millet, DO  CC: Vonna Drafts, MD

## 2023-03-08 ENCOUNTER — Other Ambulatory Visit: Payer: Self-pay | Admitting: Family Medicine

## 2023-03-08 DIAGNOSIS — K219 Gastro-esophageal reflux disease without esophagitis: Secondary | ICD-10-CM

## 2023-03-10 ENCOUNTER — Ambulatory Visit (INDEPENDENT_AMBULATORY_CARE_PROVIDER_SITE_OTHER): Payer: 59 | Admitting: Family Medicine

## 2023-03-10 ENCOUNTER — Other Ambulatory Visit: Payer: Self-pay | Admitting: Family Medicine

## 2023-03-10 ENCOUNTER — Encounter: Payer: Self-pay | Admitting: Family Medicine

## 2023-03-10 ENCOUNTER — Ambulatory Visit (INDEPENDENT_AMBULATORY_CARE_PROVIDER_SITE_OTHER): Payer: 59 | Admitting: Neurology

## 2023-03-10 ENCOUNTER — Other Ambulatory Visit (HOSPITAL_COMMUNITY): Payer: Self-pay

## 2023-03-10 ENCOUNTER — Encounter: Payer: Self-pay | Admitting: Neurology

## 2023-03-10 VITALS — BP 148/87 | HR 70 | Resp 18 | Ht 64.0 in | Wt 212.0 lb

## 2023-03-10 VITALS — BP 128/82 | HR 66 | Ht 64.0 in | Wt 212.0 lb

## 2023-03-10 DIAGNOSIS — I10 Essential (primary) hypertension: Secondary | ICD-10-CM

## 2023-03-10 DIAGNOSIS — Z6836 Body mass index (BMI) 36.0-36.9, adult: Secondary | ICD-10-CM

## 2023-03-10 DIAGNOSIS — G43001 Migraine without aura, not intractable, with status migrainosus: Secondary | ICD-10-CM | POA: Diagnosis not present

## 2023-03-10 DIAGNOSIS — M545 Low back pain, unspecified: Secondary | ICD-10-CM

## 2023-03-10 MED ORDER — EMGALITY 120 MG/ML ~~LOC~~ SOAJ: 240.0000 mg | Freq: Once | SUBCUTANEOUS | 0 refills | Status: AC

## 2023-03-10 MED ORDER — ONDANSETRON HCL 4 MG PO TABS: 4.0000 mg | ORAL_TABLET | Freq: Three times a day (TID) | ORAL | 5 refills | Status: DC | PRN

## 2023-03-10 MED ORDER — RIZATRIPTAN BENZOATE 10 MG PO TBDP: 10.0000 mg | ORAL_TABLET | ORAL | 5 refills | Status: DC | PRN

## 2023-03-10 NOTE — Patient Instructions (Addendum)
It was wonderful to see you today.  Please bring ALL of your medications with you to every visit.   Updates from today's visit:  I am sorry that insurance would not approve [facility] for you.  However, these medications are you and the rules around them are constantly evolving, so I will be sure to reach out if I learn about any new developments.  In the meantime, please be sure to follow-up with Dr. Jenne Campus and the [facility] exercise program.  Please follow up in about 6 months  Thank you for choosing [facility].   Please call [phone] with any questions about today's appointment.  Please be sure to schedule follow up at the front  desk before you leave today.   August Albino, MD  Family Medicine

## 2023-03-10 NOTE — Patient Instructions (Addendum)
Stop Aimovig.  Start Emgality - first dose is 2 injections.  Once you pick it up, contact me to let me know and I will send over the standing order for 1 injection every 28 days Rizatriptan.  Zofran for nausea

## 2023-03-10 NOTE — Assessment & Plan Note (Addendum)
Prior auth unfortunately denied for [facility]. Could be a good candidate for Zepbound but feel this is also likely to be denied given that it is a newer med. Will reach out if we learn about any changes in insurance coverage. Patient has been able to establish with our nutritionist and plans to follow-up with the [facility] exercise program which will hopefully help her.   May consider surgical intervention as a possibility down the road if she does not see benefit with lifestyle interventions.

## 2023-03-10 NOTE — Progress Notes (Signed)
  SUBJECTIVE:   CHIEF COMPLAINT / HPI:   Here for weight loss follow-up.  Attempted to prescribe Mancel Parsons but this was denied by insurance.  Also placed referral for [employer] exercise program and nutritionist Dr. Jenne Campus. -Patient was able to speak with Dr. Jenne Campus and is trying to make some healthy changes in her life including walking for 10 minutes a day and eating healthier. Next visit with her is in about 3 months -Patient is planning to attend the [employer] exercise program but is saving up money since it cost $100  Denies any acute concerns today    PERTINENT  PMH / PSH: Obesity, prediabetes   OBJECTIVE:  BP 128/82   Pulse 66   Ht 5\' 4"  (1.626 m)   Wt 212 lb (96.2 kg)   LMP  (LMP Unknown) Comment: on Megace  SpO2 99%   BMI 36.39 kg/m   General: NAD, pleasant, able to participate in exam CV: RRR no murmurs Respiratory: No respiratory distress, CTAB Skin: warm and dry, no rashes noted Psych: Normal affect and mood   ASSESSMENT/PLAN:   Class 2 severe obesity with serious comorbidity and body mass index (BMI) of 36.0 to 36.9 in adult, unspecified obesity type [facility]) Assessment & Plan: Prior auth unfortunately denied for [facility]. Could be a good candidate for Zepbound but feel this is also likely to be denied given that it is a newer med. Will reach out if we learn about any changes in insurance coverage. Patient has been able to establish with our nutritionist and plans to follow-up with the [facility] exercise program which will hopefully help her.   May consider surgical intervention as a possibility down the road if she does not see benefit with lifestyle interventions.     Return in 6 months (on 09/10/2023) for BP f/u, health maintenance.  August Albino, MD [facility]

## 2023-03-13 ENCOUNTER — Encounter: Payer: Self-pay | Admitting: Neurology

## 2023-03-14 ENCOUNTER — Other Ambulatory Visit (HOSPITAL_COMMUNITY): Payer: Self-pay

## 2023-03-16 ENCOUNTER — Encounter: Payer: Self-pay | Admitting: Neurology

## 2023-03-17 ENCOUNTER — Telehealth: Payer: Self-pay

## 2023-03-17 NOTE — Telephone Encounter (Signed)
Pt's shot is due tomorrow

## 2023-03-17 NOTE — Telephone Encounter (Signed)
Per Patient mychart message, I called pharmacy and they said my shot was not covered by my insurance but I look on my insurance app and it says it is and that it would cost me 15.00 I don't know what is going on   Please let me know   Thank u Mariah Rice patient we will let the Prior Auth team know one is needed for Emgality 120 mg every 28 days.   It may not be done in time she is due for her shot. No samples right now to give for Emgality.

## 2023-03-17 NOTE — Telephone Encounter (Signed)
Pt called back in. She stated her insurance company sent [place] an urgent prior authorization and for our office to send it back in as urgent so they can expedite the authorization.

## 2023-03-21 NOTE — Telephone Encounter (Signed)
Pt called back in stating her insurance still is telling her they haven't gotten a prior auth for her Emgality. They gave her a phone number to call to expedite it. That number is [phone].

## 2023-03-21 NOTE — Telephone Encounter (Signed)
Patient advised waiting on response.

## 2023-03-27 ENCOUNTER — Telehealth: Payer: Self-pay

## 2023-03-27 NOTE — Telephone Encounter (Signed)
A Prior Authorization was initiated & APPROVED for this patients OMEPRAZOLE 20MG  through CoverMyMeds.   Key: [id number]

## 2023-03-31 ENCOUNTER — Telehealth: Payer: Self-pay | Admitting: Anesthesiology

## 2023-03-31 MED ORDER — PREDNISONE 10 MG (21) PO TBPK: ORAL_TABLET | ORAL | 0 refills | Status: DC

## 2023-03-31 NOTE — Telephone Encounter (Signed)
Patient advised of Prednisone taper sent.

## 2023-03-31 NOTE — Telephone Encounter (Signed)
Pt left message stating Dr Everlena Cooper changed medication from Aimovig to [facility]. Emgality is not helping her migraines, states she's had a headache/migraine for several days and her rescue rizatriptan is not helping.

## 2023-03-31 NOTE — Telephone Encounter (Signed)
Patient states it "feels like someone is beating her in the head over and over".  Patient wants to know if she could have a prednisone  taper into the pharmacy.  The rizatriptan she is eating like Candy and not working to relive the Migraine.

## 2023-03-31 NOTE — Telephone Encounter (Signed)
OK to send prednisone taper.

## 2023-04-01 ENCOUNTER — Telehealth: Payer: Self-pay | Admitting: Pharmacy Technician

## 2023-04-01 ENCOUNTER — Other Ambulatory Visit (HOSPITAL_COMMUNITY): Payer: Self-pay

## 2023-04-01 NOTE — Telephone Encounter (Signed)
Patient Advocate Encounter  Prior Authorization for [facility]  has been approved.    PA# [id number] Effective dates: 4.16.24 through 7.16.24   Received notification from [facility] that prior authorization for [facility]  is required.   PA submitted on 4.16.24 Key [id number] Status is pending

## 2023-04-01 NOTE — Telephone Encounter (Signed)
Patient Advocate Encounter  Prior Authorization for [facility]  was not needed, per the plan. Please see screenshot below.     Will check with Sheena to see what pharmacy this medication is filled with.

## 2023-04-01 NOTE — Telephone Encounter (Signed)
Patient advised.

## 2023-04-01 NOTE — Telephone Encounter (Signed)
Patient Advocate Encounter  Received notification from [facility] that prior authorization for EMAGALITY  is required.   PA submitted on 4.16.24 Key [id number] Status is pending

## 2023-04-01 NOTE — Telephone Encounter (Signed)
ERROR

## 2023-04-04 ENCOUNTER — Encounter: Payer: Self-pay | Admitting: Neurology

## 2023-04-04 ENCOUNTER — Other Ambulatory Visit: Payer: Self-pay | Admitting: Neurology

## 2023-04-04 MED ORDER — EMGALITY 120 MG/ML ~~LOC~~ SOAJ: 120.0000 mg | SUBCUTANEOUS | 11 refills | Status: DC

## 2023-04-05 ENCOUNTER — Other Ambulatory Visit: Payer: Self-pay | Admitting: Family Medicine

## 2023-04-05 DIAGNOSIS — G8929 Other chronic pain: Secondary | ICD-10-CM

## 2023-04-07 ENCOUNTER — Encounter: Payer: Self-pay | Admitting: Family Medicine

## 2023-04-08 ENCOUNTER — Other Ambulatory Visit (HOSPITAL_COMMUNITY): Payer: Self-pay | Admitting: Psychiatry

## 2023-04-08 DIAGNOSIS — F419 Anxiety disorder, unspecified: Secondary | ICD-10-CM

## 2023-04-08 MED ORDER — PRAVASTATIN SODIUM 40 MG PO TABS: 40.0000 mg | ORAL_TABLET | Freq: Every day | ORAL | 3 refills | Status: DC

## 2023-04-15 ENCOUNTER — Encounter: Payer: Self-pay | Admitting: Family Medicine

## 2023-04-15 DIAGNOSIS — K219 Gastro-esophageal reflux disease without esophagitis: Secondary | ICD-10-CM

## 2023-04-16 MED ORDER — PRAVASTATIN SODIUM 40 MG PO TABS: 40.0000 mg | ORAL_TABLET | Freq: Every day | ORAL | 3 refills | Status: DC

## 2023-04-16 MED ORDER — OMEPRAZOLE 20 MG PO CPDR: 20.0000 mg | DELAYED_RELEASE_CAPSULE | Freq: Every day | ORAL | 2 refills | Status: DC

## 2023-04-23 ENCOUNTER — Telehealth (INDEPENDENT_AMBULATORY_CARE_PROVIDER_SITE_OTHER): Payer: 59 | Admitting: Physician Assistant

## 2023-04-23 DIAGNOSIS — F419 Anxiety disorder, unspecified: Secondary | ICD-10-CM | POA: Diagnosis not present

## 2023-04-23 DIAGNOSIS — F319 Bipolar disorder, unspecified: Secondary | ICD-10-CM | POA: Diagnosis not present

## 2023-04-23 DIAGNOSIS — F3130 Bipolar disorder, current episode depressed, mild or moderate severity, unspecified: Secondary | ICD-10-CM

## 2023-04-24 ENCOUNTER — Encounter (HOSPITAL_COMMUNITY): Payer: Self-pay | Admitting: Physician Assistant

## 2023-04-24 MED ORDER — OXCARBAZEPINE 600 MG PO TABS: 600.0000 mg | ORAL_TABLET | Freq: Two times a day (BID) | ORAL | 3 refills | Status: DC
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
## 2023-05-12 ENCOUNTER — Other Ambulatory Visit (HOSPITAL_COMMUNITY): Payer: Self-pay | Admitting: Physician Assistant

## 2023-05-12 DIAGNOSIS — F3130 Bipolar disorder, current episode depressed, mild or moderate severity, unspecified: Secondary | ICD-10-CM

## 2023-05-17 ENCOUNTER — Other Ambulatory Visit: Payer: Self-pay | Admitting: Family Medicine

## 2023-05-17 DIAGNOSIS — G8929 Other chronic pain: Secondary | ICD-10-CM

## 2023-05-22 ENCOUNTER — Ambulatory Visit: Payer: 59 | Admitting: Dietician

## 2023-05-23 ENCOUNTER — Other Ambulatory Visit: Payer: Self-pay

## 2023-05-23 ENCOUNTER — Encounter: Payer: Self-pay | Admitting: Family Medicine

## 2023-05-23 ENCOUNTER — Ambulatory Visit (INDEPENDENT_AMBULATORY_CARE_PROVIDER_SITE_OTHER): Payer: 59 | Admitting: Student

## 2023-05-23 ENCOUNTER — Emergency Department (HOSPITAL_COMMUNITY): Payer: 59

## 2023-05-23 ENCOUNTER — Ambulatory Visit (HOSPITAL_COMMUNITY)
Admission: RE | Admit: 2023-05-23 | Discharge: 2023-05-23 | Disposition: A | Discharge status: Home / Self Care | Payer: 59 | Source: Ambulatory Visit | Attending: Family Medicine | Admitting: Family Medicine

## 2023-05-23 ENCOUNTER — Emergency Department (HOSPITAL_COMMUNITY)
Admission: EM | Admit: 2023-05-23 | Discharge: 2023-05-23 | Disposition: A | Discharge status: Home / Self Care | Payer: 59 | Attending: Emergency Medicine | Admitting: Emergency Medicine

## 2023-05-23 VITALS — BP 133/83 | HR 72 | Ht 64.0 in | Wt 206.6 lb

## 2023-05-23 DIAGNOSIS — J449 Chronic obstructive pulmonary disease, unspecified: Secondary | ICD-10-CM | POA: Insufficient documentation

## 2023-05-23 DIAGNOSIS — I10 Essential (primary) hypertension: Secondary | ICD-10-CM | POA: Insufficient documentation

## 2023-05-23 DIAGNOSIS — R1013 Epigastric pain: Secondary | ICD-10-CM | POA: Insufficient documentation

## 2023-05-23 DIAGNOSIS — R079 Chest pain, unspecified: Secondary | ICD-10-CM | POA: Insufficient documentation

## 2023-05-23 DIAGNOSIS — E039 Hypothyroidism, unspecified: Secondary | ICD-10-CM | POA: Diagnosis not present

## 2023-05-23 DIAGNOSIS — R0789 Other chest pain: Secondary | ICD-10-CM | POA: Diagnosis not present

## 2023-05-23 LAB — BASIC METABOLIC PANEL
Anion gap: 8 (ref 5–15)
BUN: 6 mg/dL (ref 6–20)
CO2: 25 mmol/L (ref 22–32)
Calcium: 8.9 mg/dL (ref 8.9–10.3)
Chloride: 97 mmol/L — ABNORMAL LOW (ref 98–111)
Creatinine, Ser: 0.86 mg/dL (ref 0.44–1.00)
GFR, Estimated: >60 mL/min (ref >60)
Glucose, Bld: 104 mg/dL — ABNORMAL HIGH (ref 70–99)
Potassium: 3.9 mmol/L (ref 3.5–5.1)
Sodium: 130 mmol/L — ABNORMAL LOW (ref 135–145)

## 2023-05-23 LAB — CBC
HCT: 42.1 % (ref 36.0–46.0)
Hemoglobin: 14.5 g/dL (ref 12.0–15.0)
MCH: 31 pg (ref 26.0–34.0)
MCHC: 34.4 g/dL (ref 30.0–36.0)
MCV: 90 fL (ref 80.0–100.0)
Platelets: 290 10*3/uL (ref 150–400)
RBC: 4.68 MIL/uL (ref 3.87–5.11)
RDW: 13.4 % (ref 11.5–15.5)
WBC: 8.4 10*3/uL (ref 4.0–10.5)
nRBC: 0 % (ref 0.0–0.2)

## 2023-05-23 LAB — TROPONIN I (HIGH SENSITIVITY)
Troponin I (High Sensitivity): 3 ng/L (ref <18)
Troponin I (High Sensitivity): 3 ng/L (ref <18)

## 2023-05-23 MED ORDER — FAMOTIDINE IN NACL 20-0.9 MG/50ML-% IV SOLN
20.0000 mg | Freq: Once | INTRAVENOUS | Status: AC
  Administered 2023-05-23: 20 mg via INTRAVENOUS
  Filled 2023-05-23: qty 50

## 2023-05-23 MED ORDER — ASPIRIN 325 MG PO TABS
325.0000 mg | ORAL_TABLET | Freq: Once | ORAL | Status: AC
  Administered 2023-05-23: 325 mg via ORAL

## 2023-05-23 MED ORDER — LIDOCAINE VISCOUS HCL 2 % MT SOLN
15.0000 mL | Freq: Once | OROMUCOSAL | Status: AC
  Administered 2023-05-23: 15 mL via ORAL
  Filled 2023-05-23: qty 15

## 2023-05-23 MED ORDER — NITROGLYCERIN 0.4 MG SL SUBL
0.4000 mg | SUBLINGUAL_TABLET | Freq: Once | SUBLINGUAL | Status: AC
  Administered 2023-05-23: 0.4 mg via SUBLINGUAL

## 2023-05-23 MED ORDER — ALUM & MAG HYDROXIDE-SIMETH 200-200-20 MG/5ML PO SUSP
30.0000 mL | Freq: Once | ORAL | Status: AC
  Administered 2023-05-23: 30 mL via ORAL
  Filled 2023-05-23: qty 30

## 2023-05-23 NOTE — ED Notes (Signed)
Stepped outside pt is cold

## 2023-05-23 NOTE — Telephone Encounter (Signed)
Called patient to discuss concern further.  States that around 6:00-6:30 reports that chest and stomach began to hurt while drinking coffee and watching news. Chest pain was located in the middle of her chest. Feels like "chest and stomach" are being squeezed.   She states that chest pain has improved and pain is more so in her stomach now. She denies arm pain, jaw tightness, nausea, vomiting, or shortness of breath.   Spoke with Dr. McDiarmid regarding evaluation. Advised that as chest pain has improved, she can be evaluated this afternoon, however, if this returns or any worsening of symptoms, she would need to be evaluated in the ED.   Scheduled patient for this afternoon. Discussed ED precautions. Patient verbalizes understanding.   Veronda Prude, RN

## 2023-05-23 NOTE — Patient Instructions (Signed)
It was great to see you! Thank you for allowing me to participate in your care!  Our plans for today:  -We gave you aspirin and nitroglycerin in the clinic today -I strongly recommend you go straight to the ED for further workup after leaving this office visit as it is possible you could be having a heart attack based on your symptoms.     Dr. Erick Alley, DO [facility]

## 2023-05-23 NOTE — ED Triage Notes (Signed)
Pt with intermittent squeezing central chest pain with some radiation to back starting this morning at 0600. Endorses some associated sob and bilateral arm weakness. Tried taking an extra omeprazole without relief and went to PCP who gave her ASA and nitro with minimal relief. Had an episode similar in nature 1.5 weeks ago, which resolved without intervention.

## 2023-05-23 NOTE — Assessment & Plan Note (Signed)
Squeezing chest and stomach pain, worse with exertion is very concerning for MI. EKG nonconcerning and unchanged from prior but cannot rule out MI.  Do not think symptoms are related to COPD as shortness of breath is at baseline with normal work of breathing and good oxygen saturation.  If MI is ruled out in ED, can consider GERD as symptoms did get a little bit better after omeprazole.  -she was given nitroglycerin and aspirin 325 mg in clinic -She agrees with plan to go straight to ED.  Offered to arrange transport via EMS but patient declined and prefers her husband to take her

## 2023-05-23 NOTE — ED Provider Notes (Signed)
[place] EMERGENCY DEPARTMENT AT [facility] Provider Note   CSN: [id number] Arrival date & time: 05/23/23  1418     History  Chief Complaint  Patient presents with   Chest Pain    Mariah Rice is a 52 y.o. female with a history of COPD, GERD, hypertension, hyperthyroidism presents today for evaluation of chest pain.  She reports acute onset of chest pain this morning.  Pain is located in the center of her chest and in the epigastrium.  She described it as squeezing pain, nonexertional, constant, no aggravating or relieving factors..  She reports she had similar pain about 10 days ago which resolved on its own.  She thought it was acid reflux so she tried omeprazole this morning with no relief.  No personal or family history of DVT or PE.  No recent travel or surgery, leg swelling, hemoptysis.   Chest Pain     Past Medical History:  Diagnosis Date   Abnormal finding on GI tract imaging    Anaphylaxis 07/26/2020   Anxiety    COPD (chronic obstructive pulmonary disease) (HCC)    COPD exacerbation (HCC) 07/17/2020   Dental abscess    Depression    Family history of breast cancer    Family history of breast cancer 01/26/2019   Family history of ovarian cancer    Genetic testing 03/08/2019   Negative genetic testing on the CancerNext-Expanded+RNAinsight testing.  The CancerNext-Expanded gene panel offered by [facility] and includes sequencing and rearrangement analysis for the following 67 genes: AIP, ALK, APC*, ATM*, BAP1, BARD1, BLM, BMPR1A, BRCA1*, BRCA2*, BRIP1*, CDH1*, CDK4, CDKN1B, CDKN2A, CHEK2*, DICER1, FANCC, FH, FLCN, GALNT12, HOXB13, MAX, MEN1, MET, MLH1*, MRE11A, MSH2*   GERD (gastroesophageal reflux disease)    Headache    migraines   Hypertension    Hyperthyroidism 1990s   PONV (postoperative nausea and vomiting)    Shortness of breath dyspnea    with anxiety   Small bowel obstruction (HCC)    Tick bite of abdomen 05/13/2017   Past Surgical  History:  Procedure Laterality Date   BALLOON ENTEROSCOPY  02/07/2022   Procedure: BALLOON ENTEROSCOPY;  Surgeon: Shellia Cleverly, DO;  Location: [facility];  Service: Gastroenterology;;   BIOPSY  02/07/2022   Procedure: BIOPSY;  Surgeon: Shellia Cleverly, DO;  Location: [facility];  Service: Gastroenterology;;   DILATION AND CURETTAGE OF UTERUS  1999   ENTEROSCOPY N/A 05/12/2019   Procedure: ENTEROSCOPY;  Surgeon: Sherrilyn Rist, MD;  Location: [facility];  Service: Gastroenterology;  Laterality: N/A;   ENTEROSCOPY N/A 02/07/2022   Procedure: ENTEROSCOPY;  Surgeon: Shellia Cleverly, DO;  Location: [facility];  Service: Gastroenterology;  Laterality: N/A;  single balloon   FOOT SURGERY Right    INCISE AND DRAIN ABCESS  2005   LAPAROSCOPIC ASSISTED VAGINAL HYSTERECTOMY N/A 07/11/2015   Procedure: LAPAROSCOPIC ASSISTED VAGINAL Total HYSTERECTOMY;  Surgeon: Tereso Newcomer, MD;  Location: [facility];  Service: Gynecology;  Laterality: N/A;   LAPAROSCOPIC BILATERAL SALPINGECTOMY Bilateral 07/11/2015   Procedure: LAPAROSCOPIC BILATERAL SALPINGO OOPHORECTOMY ;  Surgeon: Tereso Newcomer, MD;  Location: [facility];  Service: Gynecology;  Laterality: Bilateral;   tubal ligation  2000   WISDOM TOOTH EXTRACTION     XI ROBOT ASSISTED DIAGNOSTIC LAPAROSCOPY N/A 10/16/2021   Procedure: XI ROBOT ASSISTED DIAGNOSTIC LAPAROSCOPY;  Surgeon: Henrene Dodge, MD;  Location: [facility];  Service: General;  Laterality: N/A;     Home Medications Prior to Admission medications  Medication Sig Start Date End Date Taking? Authorizing Provider  albuterol (VENTOLIN HFA) 108 (90 Base) MCG/ACT inhaler Inhale 2 puffs into the lungs every 6 (six) hours as needed for wheezing or shortness of breath. 02/13/22   Welborn, Ryan, DO  amLODipine (NORVASC) 5 MG tablet TAKE 1 TAB DAILY. INCREASE TO 2 TABLETS (10MG ) DAILY IF BLOOD PRESSURE >140/90 AFTER 1 WEEK. 12/18/22   Tiffany Kocher, DO  busPIRone (BUSPAR) 15 MG tablet  Take 1.5 tablets (22.5 mg total) by mouth 2 (two) times daily. Patient not taking: Reported on 03/10/2023 01/22/23 01/22/24  Karel Jarvis E, PA  cetirizine (ZYRTEC) 10 MG tablet TAKE 1 TABLET BY MOUTH EVERY DAY 08/05/22   Levin Erp, MD  cloNIDine (CATAPRES) 0.1 MG tablet TAKE 1 TABLET BY MOUTH 2 TIMES DAILY. 12/20/22   Vonna Drafts, MD  EPINEPHrine 0.3 mg/0.3 mL IJ SOAJ injection Inject 0.3 mg into the muscle as needed for anaphylaxis (for difficulty breathing, throat, tongue or lip swelling. must come to ED after use.). 09/21/21   Welborn, Ryan, DO  estradiol (ESTRACE) 1 MG tablet TAKE 1 TABLET BY MOUTH EVERY DAY 01/30/23   Vonna Drafts, MD  fluticasone (FLONASE) 50 MCG/ACT nasal spray USE 1 SPRAY IN EACH NOSTRIL ONCE DAILY FOR 14 DAYS Patient taking differently: Place 1 spray into both nostrils at bedtime. 10/04/21   Welborn, Ryan, DO  Galcanezumab-gnlm (EMGALITY) 120 MG/ML SOAJ Inject 120 mg into the skin every 28 (twenty-eight) days. 04/04/23   Drema Dallas, DO  lamoTRIgine (LAMICTAL) 100 MG tablet Take 1.5 tablets (150 mg total) by mouth daily. Patient not taking: Reported on 05/23/2023 01/22/23 01/22/24  Karel Jarvis E, PA  methocarbamol (ROBAXIN) 750 MG tablet TAKE 1 TABLET TWO TIMES DAILY AS NEEDED 05/19/23   Vonna Drafts, MD  omeprazole (PRILOSEC) 20 MG capsule Take 1 capsule (20 mg total) by mouth daily. 04/16/23   Lilland, Alana, DO  ondansetron (ZOFRAN) 4 MG tablet Take 1 tablet (4 mg total) by mouth every 8 (eight) hours as needed for nausea or vomiting. 03/10/23   Everlena Cooper, Adam R, DO  oxcarbazepine (TRILEPTAL) 600 MG tablet Take 1 tablet (600 mg total) by mouth 2 (two) times daily. 04/24/23 04/23/24  Nwoko, Tommas Olp, PA  pravastatin (PRAVACHOL) 40 MG tablet Take 1 tablet (40 mg total) by mouth daily. 04/16/23   Lilland, Alana, DO  predniSONE (STERAPRED UNI-PAK 21 TAB) 10 MG (21) TBPK tablet take 60mg  day 1, then 50mg  day 2, then 40mg  day 3, then 30mg  day 4, then 20mg  day 5, then 10mg  day 6, then  STOP Patient not taking: Reported on 05/23/2023 03/31/23   Drema Dallas, DO  rizatriptan (MAXALT-MLT) 10 MG disintegrating tablet Take 1 tablet (10 mg total) by mouth as needed for migraine (May repeat in 2 hours.  Maximum 2 tablets in 24 hours.). May repeat in 2 hours if needed 03/10/23   Drema Dallas, DO  Semaglutide-Weight Management (WEGOVY) 0.25 MG/0.5ML SOAJ Inject 0.25 mg into the skin once a week. Patient not taking: Reported on 05/23/2023 03/04/23   Vonna Drafts, MD  SPIRIVA RESPIMAT 2.5 MCG/ACT AERS Inhale 2 puffs into the lungs daily. 12/11/22   Vonna Drafts, MD      Allergies    Amoxicillin, Azithromycin, Meloxicam, Sumatriptan, Penicillins, and Sulfa antibiotics    Review of Systems   Review of Systems  Cardiovascular:  Positive for chest pain.    Physical Exam Updated Vital Signs BP (!) 154/91   Pulse 67   Temp 97.6  F (36.4 C)   Resp 16   LMP  (LMP Unknown) Comment: on Megace  SpO2 97%  Physical Exam Vitals and nursing note reviewed.  Constitutional:      Appearance: Normal appearance.  HENT:     Head: Normocephalic and atraumatic.     Mouth/Throat:     Mouth: Mucous membranes are moist.  Eyes:     General: No scleral icterus. Cardiovascular:     Rate and Rhythm: Normal rate and regular rhythm.     Pulses: Normal pulses.     Heart sounds: Normal heart sounds.  Pulmonary:     Effort: Pulmonary effort is normal.     Breath sounds: Normal breath sounds.  Abdominal:     General: Abdomen is flat.     Palpations: Abdomen is soft.     Tenderness: There is abdominal tenderness in the epigastric area.  Musculoskeletal:        General: No deformity.  Skin:    General: Skin is warm.     Findings: No rash.  Neurological:     General: No focal deficit present.     Mental Status: She is alert.  Psychiatric:        Mood and Affect: Mood normal.     ED Results / Procedures / Treatments   Labs (all labs ordered are listed, but only abnormal results are  displayed) Labs Reviewed  BASIC METABOLIC PANEL - Abnormal; Notable for the following components:      Result Value   Sodium 130 (*)    Chloride 97 (*)    Glucose, Bld 104 (*)    All other components within normal limits  CBC  TROPONIN I (HIGH SENSITIVITY)  TROPONIN I (HIGH SENSITIVITY)    EKG EKG Interpretation  Date/Time:  Friday May 23 2023 14:55:17 EDT Ventricular Rate:  63 PR Interval:  206 QRS Duration: 84 QT Interval:  408 QTC Calculation: 417 R Axis:   82 Text Interpretation: Normal sinus rhythm Septal infarct , age undetermined Abnormal ECG When compared with ECG of 23-May-2023 13:51, PREVIOUS ECG IS PRESENT No sig change Confirmed by Alvester Chou [phone]) on 05/23/2023 4:59:03 PM  Radiology DG Chest 2 View  Result Date: 05/23/2023 CLINICAL DATA:  Chest pain EXAM: CHEST - 2 VIEW COMPARISON:  X-ray 07/20/2020 FINDINGS: Hyperinflation. No pneumothorax or edema. Slight blunting of the posterior costophrenic angles in the lateral view. Tiny effusions are not excluded. Focal opacity at the medial right lung base. Based on the appearance this could represent prominent fat as seen on CT scan of 01/22/2022. no separate consolidation. IMPRESSION: Question tiny effusion versus pleural thickening. No consolidation or pneumothorax. Electronically Signed   By: Karen Kays M.D.   On: 05/23/2023 15:30    Procedures Procedures    Medications Ordered in ED Medications  famotidine (PEPCID) IVPB 20 mg premix (has no administration in time range)  alum & mag hydroxide-simeth (MAALOX/MYLANTA) 200-200-20 MG/5ML suspension 30 mL (has no administration in time range)    And  lidocaine (XYLOCAINE) 2 % viscous mouth solution 15 mL (has no administration in time range)    ED Course/ Medical Decision Making/ A&P Clinical Course as of 05/23/23 1926  Caleen Essex May 23, 2023  1919 This is a very pleasant 52 year old female with a history reflux gastritis, hypertension, HLD, smoking, presented to ED  with epigastric discomfort that began at rest this morning.  Patient reports that she drank black coffee and no food this morning.  Later she was on the  couch watching TV and began having epigastric discomfort, which he said initially felt like her reflux but then felt "much worse like I was being squeezed from the inside".  Since arriving in the ED her pain has improved.  It is minimal now.  She denies lightheadedness, diaphoresis, radiation of pain into her jaw or arms.  She denies any known coronary history or significant family history of coronary disease at a young age.  She does report a history of smoking, high blood pressure and high cholesterol.  Her labs were largely unremarkable.,  Delta troponin is negative.  X-ray unremarkable.  EKG showing no acute ischemic findings per my interpretation.  Symptoms improved with Maalox and Pepcid.  I strongly suspect that this is consistent reflux gastritis, but given her age and cardiovascular risk factors, I do think it is reasonable to refer her to cardiology for a second opinion.  Heartscore of 3.  The patient and her husband are quite comfortable with this plan and very much wanting to go home.  Outpatient amatory referral placed [MT]    Clinical Course User Index [MT] Trifan, Kermit Balo, MD                             Medical Decision Making Amount and/or Complexity of Data Reviewed Labs: ordered. Radiology: ordered.  Risk OTC drugs. Prescription drug management.   This patient presents to the ED for chest pain, this involves an extensive number of treatment options, and is a complaint that carries with a high risk of complications and morbidity.  The differential diagnosis includes ACS, pericarditis, PE, pneumothorax, pneumonia, GERD, muscular pain, less likely dissection with essentially normal blood pressure, symmetric bilateral pulses, and no back pain..  This is not an exhaustive list.  Lab tests: I ordered and personally interpreted labs.   The pertinent results include: WBC unremarkable. Hbg unremarkable. Platelets unremarkable. Electrolytes unremarkable. BUN, creatinine unremarkable.  Delta troponin negative.  Imaging studies: I ordered imaging studies. I personally reviewed, interpreted imaging and agree with the radiologist's interpretations. The results include: Chest x-ray showed mild effusion.  Problem list/ ED course/ Critical interventions/ Medical management: HPI: See above Vital signs within normal range and stable throughout visit. Laboratory/imaging studies significant for: See above. On physical examination, patient is afebrile and appears in no acute distress.  EKG without signs of active ischemia, given the timing of pain to ED presentation, delta troponin was negative so doubt NSTEMI.  Presentation not consistent with acute PE, patient has low Wells score, no shortness of breath, no tachypnea, or hypoxia.  Based on chest x-ray unlikely pneumonia, pneumothorax. Symptoms most likely consistent with GERD vs gastroenteritis. Given GI cocktail here in the ER.  Reevaluation of patient after this medication showed that patient improved.  Heart score 3 so plan to discharge patient home with cardiology follow-up. I have reviewed the patient home medicines and have made adjustments as needed.  Cardiac monitoring/EKG: The patient was maintained on a cardiac monitor.  I personally reviewed and interpreted the cardiac monitor which showed an underlying rhythm of: sinus rhythm.  Additional history obtained: External records from outside source obtained and reviewed including: Chart review including previous notes, labs, imaging.  Consultations obtained:  Disposition Continued outpatient therapy. Follow-up with cardiology recommended for reevaluation of symptoms. Treatment plan discussed with patient.  Pt acknowledged understanding was agreeable to the plan. Worrisome signs and symptoms were discussed with patient, and patient  acknowledged understanding to return  to the ED if they noticed these signs and symptoms. Patient was stable upon discharge.   This chart was dictated using voice recognition software.  Despite best efforts to proofread,  errors can occur which can change the documentation meaning.          Final Clinical Impression(s) / ED Diagnoses Final diagnoses:  Chest pain, unspecified type    Rx / DC Orders ED Discharge Orders     None         Jeanelle Malling, [place] 05/23/23 1944    Terald Sleeper, MD 05/24/23 [phone]

## 2023-05-23 NOTE — Discharge Instructions (Addendum)
Please take your medications as prescribed. Take tylenol/ibuprofen every 6 hours for pain. I recommend close follow-up with cardiology for reevaluation.  Please do not hesitate to return to emergency department if worrisome signs symptoms we discussed become apparent.

## 2023-05-23 NOTE — Progress Notes (Signed)
    SUBJECTIVE:   CHIEF COMPLAINT / HPI:   Chest and stomach pain Patient spoke to RN earlier today stating around 6 AM her chest and stomach started hurting (felt like they were being squeezed) while she was drinking coffee. She thought it may be indigestion and took an extra dose of omeprazole. Symptoms eased up significantly within a few hours although she still reports mild squeezing sensation of chest and stomach now, it is worse with exertion. Also complains of significant fatigue since this morning. Had a similar episode to this about a week ago which resolved on its own after lying down.   No nausea, vomiting, diaphoresis. Does admit to SOB but attributes this to basline COPD. No increased cough.   PERTINENT  PMH / PSH: HTN, COPD, migraines, GERD, mood disorder, obesity  OBJECTIVE:   BP 133/83 (BP Location: Left Arm, Patient Position: Sitting, Cuff Size: Normal)   Pulse 72   Ht 5\' 4"  (1.626 m)   Wt 206 lb 9.6 oz (93.7 kg)   LMP  (LMP Unknown) Comment: on Megace  SpO2 99%   BMI 35.46 kg/m    General: NAD, pleasant, able to participate in exam Cardiac: RRR, no murmurs. Respiratory: normal effort, very mild expiratory wheezing throughout  Abdomen: non tender, nondistended, soft Extremities: trace pitting edema of BLEs Skin: warm and dry Neuro: alert, no obvious focal deficits Psych: Normal affect and mood  ASSESSMENT/PLAN:   Chest pain Squeezing chest and stomach pain, worse with exertion is very concerning for MI. EKG nonconcerning and unchanged from prior but cannot rule out MI.  Do not think symptoms are related to COPD as shortness of breath is at baseline with normal work of breathing and good oxygen saturation.  If MI is ruled out in ED, can consider GERD as symptoms did get a little bit better after omeprazole.  -she was given nitroglycerin and aspirin 325 mg in clinic -She agrees with plan to go straight to ED.  Offered to arrange transport via EMS but patient  declined and prefers her husband to take her     Dr. Erick Alley, DO [facility]

## 2023-05-27 NOTE — Progress Notes (Unsigned)
Cardiology Office Note:   Date:  05/29/2023  ID:  Naliya Cheza, [place] Apr 23, 1971, MRN [id number]  History of Present Illness:   Jeriyah M Beacher is a 52 y.o. female with history of COPD, anxiety, depression, GERD and HTN who was referred by Dr. Wylene Simmer for further evaluation of chest pain.  Was seen in [facility] ER on 06/07 for evaluation of chest pain. Pain was not exertional in nature. Trop negative x2. ECG nonischemic. CXR with ? Tiny effusion vs pleural thickening but otherwise unremarkable. She was discharged home with CV follow-up.  Today, the patient states that last Friday she got up to go to work and drank coffee and developed squeezing pain in the center of her chest. Episode lasted about 72min-1h before abating. Was seen by her PCP who referred her to the ER as detailed above. Since her hospital visit, she has had one other episode of chest discomfort this morning. Otherwise, has chronic dyspnea on exertion which she attributes to COPD, but this has not progressed. Has mild edema with prolonged sitting. Has always sleep propped up for comfort but no PND. No personal history of known CAD or HF.   Family history: Paternal grandmother with CHF, father with CAD with suspected massive MI in 28, maternal grandmother with Afib  Blood pressure mainly 130/70-80.  Past Medical History:  Diagnosis Date   Abnormal finding on GI tract imaging    Anaphylaxis 07/26/2020   Anxiety    COPD (chronic obstructive pulmonary disease) (HCC)    COPD exacerbation (HCC) 07/17/2020   Dental abscess    Depression    Family history of breast cancer    Family history of breast cancer 01/26/2019   Family history of ovarian cancer    Genetic testing 03/08/2019   Negative genetic testing on the CancerNext-Expanded+RNAinsight testing.  The CancerNext-Expanded gene panel offered by [facility] and includes sequencing and rearrangement analysis for the following 67 genes: AIP, ALK, APC*, ATM*, BAP1, BARD1, BLM, BMPR1A,  BRCA1*, BRCA2*, BRIP1*, CDH1*, CDK4, CDKN1B, CDKN2A, CHEK2*, DICER1, FANCC, FH, FLCN, GALNT12, HOXB13, MAX, MEN1, MET, MLH1*, MRE11A, MSH2*   GERD (gastroesophageal reflux disease)    Headache    migraines   Hypertension    Hyperthyroidism 1990s   PONV (postoperative nausea and vomiting)    Shortness of breath dyspnea    with anxiety   Small bowel obstruction (HCC)    Tick bite of abdomen 05/13/2017     ROS: As per HPI  Studies Reviewed:    EKG:  ECG today shows NSR, HR 66-personally reviewed  Cardiac Studies & Procedures       ECHOCARDIOGRAM  ECHOCARDIOGRAM COMPLETE 08/03/2020  Narrative ECHOCARDIOGRAM REPORT    Patient Name:   Jeffie Pollock Date of Exam: 08/03/2020 Medical Rec #:  [id number]      Height:       64.0 in Accession #:    [id number]     Weight:       220.6 lb Date of Birth:  [birth year]      BSA:          2.040 m Patient Age:    48 years       BP:           150/88 mmHg Patient Gender: F              HR:           62 bpm. Exam Location:  Outpatient  Procedure: 2D Echo, Cardiac Doppler and Color Doppler  Indications:    R06.9 DOE  History:        Patient has prior history of Echocardiogram examinations, most recent 05/24/2019. COPD; Risk Factors:Hypertension.  Sonographer:    Eulah Pont RDCS Referring Phys: [id number] MARSHALL L CHAMBLISS  IMPRESSIONS   1. Left ventricular ejection fraction, by estimation, is 60 to 65%. The left ventricle has normal function. The left ventricle has no regional wall motion abnormalities. Left ventricular diastolic parameters are indeterminate. 2. Right ventricular systolic function is normal. The right ventricular size is normal. Tricuspid regurgitation signal is inadequate for assessing PA pressure. 3. The mitral valve is normal in structure. No evidence of mitral valve regurgitation. 4. The aortic valve was not well visualized. Aortic valve regurgitation is not visualized. No aortic stenosis is present. 5. The inferior  vena cava is normal in size with greater than 50% respiratory variability, suggesting right atrial pressure of 3 mmHg.  FINDINGS Left Ventricle: Left ventricular ejection fraction, by estimation, is 60 to 65%. The left ventricle has normal function. The left ventricle has no regional wall motion abnormalities. The left ventricular internal cavity size was normal in size. There is no left ventricular hypertrophy. Left ventricular diastolic parameters are indeterminate.  Right Ventricle: The right ventricular size is normal. Right vetricular wall thickness was not assessed. Right ventricular systolic function is normal. Tricuspid regurgitation signal is inadequate for assessing PA pressure.  Left Atrium: Left atrial size was normal in size.  Right Atrium: Right atrial size was normal in size.  Pericardium: Trivial pericardial effusion is present.  Mitral Valve: The mitral valve is normal in structure. No evidence of mitral valve regurgitation.  Tricuspid Valve: The tricuspid valve is normal in structure. Tricuspid valve regurgitation is not demonstrated.  Aortic Valve: The aortic valve was not well visualized. Aortic valve regurgitation is not visualized. No aortic stenosis is present.  Pulmonic Valve: The pulmonic valve was not well visualized. Pulmonic valve regurgitation is not visualized.  Aorta: The aortic root and ascending aorta are structurally normal, with no evidence of dilitation.  Venous: The inferior vena cava is normal in size with greater than 50% respiratory variability, suggesting right atrial pressure of 3 mmHg.  IAS/Shunts: The interatrial septum was not well visualized.   LEFT VENTRICLE PLAX 2D LVIDd:         4.20 cm  Diastology LVIDs:         2.70 cm  LV e' lateral:   10.40 cm/s LV PW:         1.00 cm  LV E/e' lateral: 12.1 LV IVS:        0.90 cm  LV e' medial:    6.92 cm/s LVOT diam:     2.10 cm  LV E/e' medial:  18.2 LV SV:         82 LV SV Index:   40 LVOT  Area:     3.46 cm   RIGHT VENTRICLE RV S prime:     11.70 cm/s TAPSE (M-mode): 2.6 cm  LEFT ATRIUM             Index       RIGHT ATRIUM           Index LA diam:        3.10 cm 1.52 cm/m  RA Area:     11.60 cm LA Vol (A2C):   45.9 ml 22.50 ml/m RA Volume:   26.60 ml  13.04 ml/m LA Vol (A4C):   32.8 ml 16.08 ml/m LA Biplane  Vol: 39.4 ml 19.32 ml/m AORTIC VALVE LVOT Vmax:   99.80 cm/s LVOT Vmean:  68.300 cm/s LVOT VTI:    0.237 m  AORTA Ao Root diam: 2.90 cm Ao Asc diam:  2.80 cm  MITRAL VALVE MV Area (PHT): 3.17 cm     SHUNTS MV Decel Time: 239 msec     Systemic VTI:  0.24 m MV E velocity: 126.00 cm/s  Systemic Diam: 2.10 cm MV A velocity: 97.40 cm/s MV E/A ratio:  1.29  Epifanio Lesches MD Electronically signed by Epifanio Lesches MD Signature Date/Time: 08/03/2020/8:08:59 PM    Final              Risk Assessment/Calculations:              Physical Exam:   VS:  BP 128/72   Pulse 73   Ht 5\' 4"  (1.626 m)   Wt 209 lb 9.6 oz (95.1 kg)   LMP  (LMP Unknown) Comment: on Megace  SpO2 95%   BMI 35.98 kg/m    Wt Readings from Last 3 Encounters:  05/29/23 209 lb 9.6 oz (95.1 kg)  05/23/23 206 lb 9.6 oz (93.7 kg)  03/10/23 212 lb (96.2 kg)     GEN: Well nourished, well developed in no acute distress NECK: No JVD; No carotid bruits CARDIAC: RRR, no murmurs, rubs, gallops RESPIRATORY:  Clear to auscultation without rales, wheezing or rhonchi  ABDOMEN: Soft, non-tender, non-distended EXTREMITIES:  No edema; No deformity   ASSESSMENT AND PLAN:   #Atypical Chest Pain: -Patient with squeezing discomfort when drinking coffee lasting 30-1h prompting ER visit as above -ER work-up reassuring but certainly has risk factors for CAD with tobacco use, family history, HTN and HLD -Will check coronary CTA for further evaluation -Check TTE   #HTN: -Fairly well controlled 130/70-80s at home -Continue amlodipine 5mg  dialy -Continue clonidine 0.1mg  BID for  now -May consider switching meds in the future and trial agent other than clonidine if able (prefers to start with ischemic work-up first)  #HLD: -Continue pravstatin 40mg  daily -Has an allergy to another statin (unsure which one it was) -May need to adjust pravastatin pending CTA findings  #Tobacco Abuse: -Working on cessation        Signed, Meriam Sprague, MD

## 2023-05-29 ENCOUNTER — Ambulatory Visit: Payer: 59 | Attending: Cardiology | Admitting: Cardiology

## 2023-05-29 ENCOUNTER — Encounter: Payer: Self-pay | Admitting: Cardiology

## 2023-05-29 VITALS — BP 128/72 | HR 73 | Ht 64.0 in | Wt 209.6 lb

## 2023-05-29 DIAGNOSIS — I10 Essential (primary) hypertension: Secondary | ICD-10-CM | POA: Diagnosis not present

## 2023-05-29 DIAGNOSIS — Z720 Tobacco use: Secondary | ICD-10-CM

## 2023-05-29 DIAGNOSIS — E782 Mixed hyperlipidemia: Secondary | ICD-10-CM

## 2023-05-29 DIAGNOSIS — R072 Precordial pain: Secondary | ICD-10-CM

## 2023-05-29 MED ORDER — METOPROLOL TARTRATE 100 MG PO TABS: 100.0000 mg | ORAL_TABLET | Freq: Once | ORAL | 0 refills | Status: DC

## 2023-05-29 NOTE — Patient Instructions (Signed)
Medication Instructions:   Your physician recommends that you continue on your current medications as directed. Please refer to the Current Medication list given to you today.  *If you need a refill on your cardiac medications before your next appointment, please call your pharmacy*   Testing/Procedures:  Your physician has requested that you have an echocardiogram. Echocardiography is a painless test that uses sound waves to create images of your heart. It provides your doctor with information about the size and shape of your heart and how well your heart's chambers and valves are working. This procedure takes approximately one hour. There are no restrictions for this procedure. Please do NOT wear cologne, perfume, aftershave, or lotions (deodorant is allowed). Please arrive 15 minutes prior to your appointment time.       Your cardiac CT will be scheduled at one of the below locations:   [facility] [address] [place], [place] [address] [phone]   If scheduled at [facility], please arrive at the [facility] and Children's Entrance (Entrance C2) of [facility] 30 minutes prior to test start time. You can use the FREE valet parking offered at entrance C (encouraged to control the heart rate for the test)  Proceed to the [facility] Radiology Department (first floor) to check-in and test prep.  All radiology patients and guests should use entrance C2 at [facility], accessed from [facility], even though the hospital's physical address listed is [address].      Please follow these instructions carefully (unless otherwise directed):   On the Night Before the Test: Be sure to Drink plenty of water. Do not consume any caffeinated/decaffeinated beverages or chocolate 12 hours prior to your test. Do not take any antihistamines 12 hours prior to your test.  DO NOT TAKE YOUR ZYRTEC 12 HOURS PRIOR TO THIS TEST   On  the Day of the Test: Drink plenty of water until 1 hour prior to the test. Do not eat any food 1 hour prior to test. You may take your regular medications prior to the test.  Take metoprolol 100 MG BY MOUTH (Lopressor) two hours prior to test.  FEMALES- please wear underwire-free bra if available, avoid dresses & tight clothing        After the Test: Drink plenty of water. After receiving IV contrast, you may experience a mild flushed feeling. This is normal. On occasion, you may experience a mild rash up to 24 hours after the test. This is not dangerous. If this occurs, you can take Benadryl 25 mg and increase your fluid intake. If you experience trouble breathing, this can be serious. If it is severe call 911 IMMEDIATELY. If it is mild, please call our office.   We will call to schedule your test 2-4 weeks out understanding that some insurance companies will need an authorization prior to the service being performed.   For non-scheduling related questions, please contact the cardiac imaging nurse navigator should you have any questions/concerns: Rockwell Alexandria, Cardiac Imaging Nurse Navigator Larey Brick, Cardiac Imaging Nurse Navigator [facility] Direct Office Dial: [phone]   For scheduling needs, including cancellations and rescheduling, please call [place], [phone].    Follow-Up: At [facility], you and your health needs are our priority.  As part of our continuing mission to provide you with exceptional heart care, we have created designated Provider Care Teams.  These Care Teams include your primary Cardiologist (physician)  and Advanced Practice Providers (APPs -  Physician Assistants and Nurse Practitioners) who all work together to provide you with the care you need, when you need it.  We recommend signing up for the patient portal called "MyChart".  Sign up information is provided on this After Visit Summary.  MyChart is used  to connect with patients for Virtual Visits (Telemedicine).  Patients are able to view lab/test results, encounter notes, upcoming appointments, etc.  Non-urgent messages can be sent to your provider as well.   To learn more about what you can do with MyChart, go to [email or web address].    Your next appointment:   1 year(s)  Provider:   DR. MARK SKAINS

## 2023-05-30 NOTE — Addendum Note (Signed)
Addended byFerne Reus on: 05/30/2023 03:18 PM   Modules accepted: Orders

## 2023-06-04 ENCOUNTER — Telehealth: Payer: Self-pay | Admitting: *Deleted

## 2023-06-04 ENCOUNTER — Encounter: Payer: Self-pay | Admitting: Cardiology

## 2023-06-04 NOTE — Telephone Encounter (Signed)
-----   Message from Lorrin Jackson sent at 06/04/2023 12:55 PM EDT ----- Regarding: ct scan Scheduled 6/24 at 12:30

## 2023-06-05 ENCOUNTER — Encounter: Payer: Self-pay | Admitting: *Deleted

## 2023-06-06 ENCOUNTER — Telehealth (HOSPITAL_COMMUNITY): Payer: Self-pay | Admitting: *Deleted

## 2023-06-06 NOTE — Telephone Encounter (Signed)
Attempted to call patient regarding upcoming cardiac CT appointment. Left message on voicemail with name and callback number Hailyn Zarr RN Navigator Cardiac Imaging [facility] [phone] Office  

## 2023-06-09 ENCOUNTER — Ambulatory Visit (HOSPITAL_COMMUNITY)
Admission: RE | Admit: 2023-06-09 | Discharge: 2023-06-09 | Disposition: A | Discharge status: Home / Self Care | Payer: 59 | Source: Ambulatory Visit | Attending: Cardiology | Admitting: Cardiology

## 2023-06-09 ENCOUNTER — Telehealth: Payer: Self-pay | Admitting: Cardiology

## 2023-06-09 DIAGNOSIS — R072 Precordial pain: Secondary | ICD-10-CM | POA: Diagnosis not present

## 2023-06-09 MED ORDER — NITROGLYCERIN 0.4 MG SL SUBL
SUBLINGUAL_TABLET | SUBLINGUAL | Status: AC
  Filled 2023-06-09: qty 2

## 2023-06-09 MED ORDER — IOHEXOL 350 MG/ML SOLN
100.0000 mL | Freq: Once | INTRAVENOUS | Status: AC | PRN
  Administered 2023-06-09: 100 mL via INTRAVENOUS

## 2023-06-09 MED ORDER — NITROGLYCERIN 0.4 MG SL SUBL
0.8000 mg | SUBLINGUAL_TABLET | SUBLINGUAL | Status: DC | PRN
  Administered 2023-06-09: 0.8 mg via SUBLINGUAL

## 2023-06-09 NOTE — Progress Notes (Signed)
Patient tolerated procedure without distress 

## 2023-06-09 NOTE — Telephone Encounter (Signed)
  Pt c/o medication issue:  1. Name of Medication: BP  medication  2. How are you currently taking this medication (dosage and times per day)?   3. Are you having a reaction (difficulty breathing--STAT)?   4. What is your medication issue? Pt would like to know if she can take her 2 other heart meds before her CT scan today at 12:30 pm

## 2023-06-09 NOTE — Telephone Encounter (Signed)
Went over the pts Cardiac CT instructions with her, as referenced below.  Advised her that the only med she had to hold for this test was her zyrtec.  She confirmed with me she hasn't taken any zyrtec in the last couple days.  Advised her that she should take her metoprolol tartrate 100 mg po 2 hrs prior to scan today.  Advised her she would be taking the metoprolol 100 mg tablet today at 1030 for CTA at 12:30 pm.  Advised her she can hold her other BP meds if she'd like this morning or she can take them, either way is acceptable.    Pt will have her CTA as scheduled for today at 12:30 pm.   Pt had no further questions or concerns about her CTA today.  Pt verbalized understanding and agrees with this plan. Pt was more than gracious for all the assistance provided.       Your cardiac CT will be scheduled at one of the below locations:    [facility] [address] [place], [place] [address] [phone]     If scheduled at [facility], please arrive at the [facility] and Children's Entrance (Entrance C2) of [facility] 30 minutes prior to test start time. You can use the FREE valet parking offered at entrance C (encouraged to control the heart rate for the test)  Proceed to the [facility] Radiology Department (first floor) to check-in and test prep.   All radiology patients and guests should use entrance C2 at [facility], accessed from [facility], even though the hospital's physical address listed is [address]          Please follow these instructions carefully (unless otherwise directed):     On the Night Before the Test: Be sure to Drink plenty of water. Do not consume any caffeinated/decaffeinated beverages or chocolate 12 hours prior to your test. Do not take any antihistamines 12 hours prior to your test.  DO NOT TAKE YOUR ZYRTEC 12 HOURS PRIOR TO THIS TEST     On the Day of the Test: Drink plenty of water  until 1 hour prior to the test. Do not eat any food 1 hour prior to test. You may take your regular medications prior to the test.  Take metoprolol 100 MG BY MOUTH (Lopressor) two hours prior to test.   FEMALES- please wear underwire-free bra if available, avoid dresses & tight clothing          After the Test: Drink plenty of water. After receiving IV contrast, you may experience a mild flushed feeling. This is normal. On occasion, you may experience a mild rash up to 24 hours after the test. This is not dangerous. If this occurs, you can take Benadryl 25 mg and increase your fluid intake. If you experience trouble breathing, this can be serious. If it is severe call 911 IMMEDIATELY. If it is mild, please call our office.     We will call to schedule your test 2-4 weeks out understanding that some insurance companies will need an authorization prior to the service being performed.    For non-scheduling related questions, please contact the cardiac imaging nurse navigator should you have any questions/concerns: Rockwell Alexandria, Cardiac Imaging Nurse Navigator Larey Brick, Cardiac Imaging Nurse Navigator [facility] Direct Office Dial: [phone]    For scheduling needs, including cancellations and rescheduling, please call [place], [phone].

## 2023-06-10 ENCOUNTER — Telehealth: Payer: Self-pay | Admitting: *Deleted

## 2023-06-10 DIAGNOSIS — Z79899 Other long term (current) drug therapy: Secondary | ICD-10-CM

## 2023-06-10 DIAGNOSIS — E782 Mixed hyperlipidemia: Secondary | ICD-10-CM

## 2023-06-10 DIAGNOSIS — I2510 Atherosclerotic heart disease of native coronary artery without angina pectoris: Secondary | ICD-10-CM

## 2023-06-10 MED ORDER — ROSUVASTATIN CALCIUM 10 MG PO TABS
10.0000 mg | ORAL_TABLET | Freq: Every day | ORAL | 2 refills | Status: DC
Start: 2023-06-10 — End: 2023-08-04

## 2023-06-10 NOTE — Telephone Encounter (Signed)
The patient has been notified of the result and verbalized understanding.  All questions (if any) were answered.  Pt is aware that we will stop her pravastatin and switch her to crestor 10 mg po daily and she will need to come back in for repeat lipids in 8 weeks, to reassess her LDL.   Confirmed the pharmacy of choice with the pt.   Scheduled the pt for repeat lipids in 8 weeks on 08/07/23.  She is aware to come fasting to this lab appt.   Pt verbalized understanding and agrees with this plan.

## 2023-06-10 NOTE — Telephone Encounter (Signed)
-----   Message from Meriam Sprague, MD sent at 06/09/2023  8:57 PM EDT ----- The CT scan of her heart arteries shows mild disease. Her Ca score is 15.6 which is the 93% for age, gender, race matched controls. This means she has mild plaque but it is not blocking blood flow. The goal is to prevent this plaque from worsening with time. To do this, can we change her prava to crestor 10mg  daily and repeat lipids in 8 weeks with goal LDL<70.

## 2023-06-12 ENCOUNTER — Other Ambulatory Visit: Payer: Self-pay | Admitting: Student

## 2023-06-20 ENCOUNTER — Other Ambulatory Visit: Payer: Self-pay | Admitting: Family Medicine

## 2023-06-20 DIAGNOSIS — I10 Essential (primary) hypertension: Secondary | ICD-10-CM

## 2023-06-24 ENCOUNTER — Telehealth: Payer: Self-pay | Admitting: *Deleted

## 2023-06-24 DIAGNOSIS — K769 Liver disease, unspecified: Secondary | ICD-10-CM

## 2023-06-24 NOTE — Telephone Encounter (Signed)
Pt aware of over-read by Radiologist and that Dr. Shari Prows suggest that we order for her to get a liver US done to better assess finding.  Informed the pt that Dr. Shari Prows also wants her to get in with her PCP for this, to follow and manage.  Pt is aware that I will place the order for he liver US in the system and send a message to our [facility] Scheduling team to call her back to arrange this appt.   Pt aware I will also forward a copy of this report to her PCP on file, for reference when seeing the pt.   Pt verbalized understanding and agrees with this plan.

## 2023-06-24 NOTE — Telephone Encounter (Signed)
-----   Message from Dossie Arbour, RN sent at 06/24/2023  8:13 AM EDT -----  ----- Message ----- From: Meriam Sprague, MD Sent: 06/16/2023   9:52 AM EDT To: [facility] Triage  The CT scan that was over-read by our radiologist showed a small 1.3cm lesion in the liver. This may reflect a hemangioma (collection of blood vessels) but to assess this further, they recommend a liver ultrasound which we can order. Please ensure she follows up with her PCP as well.

## 2023-06-25 ENCOUNTER — Ambulatory Visit (HOSPITAL_COMMUNITY): Payer: 59

## 2023-07-01 ENCOUNTER — Ambulatory Visit (HOSPITAL_COMMUNITY)
Admission: RE | Admit: 2023-07-01 | Discharge: 2023-07-01 | Disposition: A | Discharge status: Home / Self Care | Payer: 59 | Source: Ambulatory Visit | Attending: Cardiology | Admitting: Cardiology

## 2023-07-01 DIAGNOSIS — K769 Liver disease, unspecified: Secondary | ICD-10-CM | POA: Insufficient documentation

## 2023-07-04 ENCOUNTER — Other Ambulatory Visit: Payer: Self-pay | Admitting: Family Medicine

## 2023-07-04 DIAGNOSIS — G8929 Other chronic pain: Secondary | ICD-10-CM

## 2023-07-05 ENCOUNTER — Telehealth: Payer: Self-pay | Admitting: Pharmacy Technician

## 2023-07-05 ENCOUNTER — Other Ambulatory Visit (HOSPITAL_COMMUNITY): Payer: Self-pay

## 2023-07-05 ENCOUNTER — Other Ambulatory Visit: Payer: Self-pay | Admitting: Family Medicine

## 2023-07-05 DIAGNOSIS — J449 Chronic obstructive pulmonary disease, unspecified: Secondary | ICD-10-CM

## 2023-07-05 NOTE — Telephone Encounter (Signed)
Pharmacy Patient Advocate Encounter   Received notification from CoverMyMeds that prior authorization for [facility] 120MG  is required/requested.   Insurance verification completed.   The patient is insured through [employer] [facility] .   Per test claim: PA submitted to [employer] [facility] via CoverMyMeds Key/confirmation #/EOC [facility] Status is pending

## 2023-07-07 ENCOUNTER — Other Ambulatory Visit (HOSPITAL_COMMUNITY): Payer: Self-pay

## 2023-07-07 NOTE — Telephone Encounter (Signed)
Pharmacy Patient Advocate Encounter  Received notification from [employer] [facility] that Prior Authorization for [employer] 120MG /ML auto-injectors (migraine) has been APPROVED from 07-05-2023 to 07-04-2024.Marland Kitchen  PA #/Case ID/Reference #: [id number]

## 2023-07-09 NOTE — Telephone Encounter (Signed)
Patient advised.

## 2023-07-15 ENCOUNTER — Ambulatory Visit (INDEPENDENT_AMBULATORY_CARE_PROVIDER_SITE_OTHER): Payer: 59 | Admitting: Family Medicine

## 2023-07-15 ENCOUNTER — Encounter: Payer: Self-pay | Admitting: Family Medicine

## 2023-07-15 VITALS — BP 140/91 | HR 62 | Ht 64.0 in | Wt 208.6 lb

## 2023-07-15 DIAGNOSIS — D1803 Hemangioma of intra-abdominal structures: Secondary | ICD-10-CM | POA: Diagnosis not present

## 2023-07-15 DIAGNOSIS — Z720 Tobacco use: Secondary | ICD-10-CM

## 2023-07-15 DIAGNOSIS — F419 Anxiety disorder, unspecified: Secondary | ICD-10-CM

## 2023-07-15 DIAGNOSIS — I10 Essential (primary) hypertension: Secondary | ICD-10-CM

## 2023-07-15 DIAGNOSIS — F32A Depression, unspecified: Secondary | ICD-10-CM

## 2023-07-15 NOTE — Patient Instructions (Addendum)
Started here by everything going on right now.  Hope things get better.  Please do not hesitate to get back in touch if you need to talk.  I encourage you to seek out therapy and pursue mindful meditation and focus on yourself.  If things get worse we can consider having you see a psychiatrist.  Please continue to check your blood pressure at home and let me know if you regularly see numbers that are above 150-160.  You do not need any additional imaging of the benign lesion noted on your liver.  It is called a hemangioma and is small enough to where it should not cause any problems.

## 2023-07-15 NOTE — Assessment & Plan Note (Signed)
1.8 x 1.3 cm hemangioma noted on coronary CTA and RUQ ultrasound.  Asymptomatic.  No need for follow-up imaging given size less than 5 cm.

## 2023-07-15 NOTE — Assessment & Plan Note (Signed)
BP is very slightly elevated in clinic today likely in the setting of her stress.  Overall is well-controlled on amlodipine and clonidine.  Continue current regimen, advised patient to reach out if she sees elevated BP at home.

## 2023-07-15 NOTE — Progress Notes (Signed)
SUBJECTIVE:   CHIEF COMPLAINT / HPI:   Here to discuss spot on liver -CT scan done by cardiology showed small 1.3 cm lesion in the liver that may reflect a hemangioma.  Liver ultrasound was completed and showed 1.8 x 1.3 cm hyperechoic lesion most likely representative of a hemangioma.  Denies any abdominal/RUQ pain  She has close follow-up scheduled with cardiology.  Under a lot of stress recently - frequently gets chest pain associated with anxiety. Has tried many different medications without relief including buspar, lamictal, atarax. Tried her friend's xanax which she said helped a lot.  Recently unemployed x2 weeks which has been exacerbating her anxiety. She's also been taking care of multiple family members recently. Feels like she's been neglecting herself.  Has been trying to quit smoking but has run into issues with this especially given her recent stress.  She would like to continue trying on her own but expresses interest in possibly seeing Dr. Raymondo Band eventually.  PERTINENT  PMH / PSH:   Past Medical History:  Diagnosis Date   Abnormal finding on GI tract imaging    Anaphylaxis 07/26/2020   Anxiety    COPD (chronic obstructive pulmonary disease) (HCC)    COPD exacerbation (HCC) 07/17/2020   Dental abscess    Depression    Family history of breast cancer    Family history of breast cancer 01/26/2019   Family history of ovarian cancer    Genetic testing 03/08/2019   Negative genetic testing on the CancerNext-Expanded+RNAinsight testing.  The CancerNext-Expanded gene panel offered by [employer] and includes sequencing and rearrangement analysis for the following 67 genes: AIP, ALK, APC*, ATM*, BAP1, BARD1, BLM, BMPR1A, BRCA1*, BRCA2*, BRIP1*, CDH1*, CDK4, CDKN1B, CDKN2A, CHEK2*, DICER1, FANCC, FH, FLCN, GALNT12, HOXB13, MAX, MEN1, MET, MLH1*, MRE11A, MSH2*   GERD (gastroesophageal reflux disease)    Headache    migraines   Hypertension    Hyperthyroidism 1990s    PONV (postoperative nausea and vomiting)    Shortness of breath dyspnea    with anxiety   Small bowel obstruction (HCC)    Tick bite of abdomen 05/13/2017    OBJECTIVE:  BP (!) 140/91   Pulse 62   Ht 5\' 4"  (1.626 m)   Wt 208 lb 9.6 oz (94.6 kg)   LMP  (LMP Unknown) Comment: on Megace  SpO2 100%   BMI 35.81 kg/m   General: NAD, pleasant, able to participate in exam Cardiac: RRR, no murmurs auscultated Respiratory: CTAB, normal WOB Abdomen: soft, non-tender, non-distended, normoactive bowel sounds Extremities: warm and well perfused, no edema or cyanosis Skin: warm and dry, no rashes noted Neuro: alert, no obvious focal deficits, speech normal Psych: Normal affect and mood   RUQ [place]: Liver:   1.8 x 1.3 cm hyperechoic lesion in the right lobe liver correlating to the CT finding. Within normal limits in parenchymal echogenicity. Portal vein is patent on color Doppler imaging with normal direction of blood flow towards the liver.   Other: None.   IMPRESSION: 1.8 x 1.3 cm hyperechoic lesion in the right lobe liver correlating to the CT finding. This most likely represents a hemangioma. Electronically Signed   By: Sherian Rein M.D.   On: 07/01/2023 09:37  ASSESSMENT/PLAN:   Liver hemangioma Assessment & Plan: 1.8 x 1.3 cm hemangioma noted on coronary CTA and RUQ ultrasound.  Asymptomatic.  No need for follow-up imaging given size less than 5 cm.   Primary hypertension Assessment & Plan: BP is very slightly elevated in  clinic today likely in the setting of her stress.  Overall is well-controlled on amlodipine and clonidine.  Continue current regimen, advised patient to reach out if she sees elevated BP at home.   Anxiety and depression Assessment & Plan: Going through a lot recently with work and family.  Recently unemployed but is actively looking for work.  Advised patient to establish with therapy.  She was previously on Lamictal and BuSpar for mood disorder but  stopped these on her own.  If symptoms worsen would recommend that she see psychiatry.     Tobacco abuse Assessment & Plan: Discuss smoking cessation options with patient.  She wishes to continue trying on her own.  She has tried patches and gum in the past.  She is open to medications and seeing Dr. Raymondo Band but would like to revisit this at a later time.    No orders of the defined types were placed in this encounter.  Return in about 3 months (around 10/15/2023).  Vonna Drafts, MD [facility]

## 2023-07-15 NOTE — Assessment & Plan Note (Signed)
Going through a lot recently with work and family.  Recently unemployed but is actively looking for work.  Advised patient to establish with therapy.  She was previously on Lamictal and BuSpar for mood disorder but stopped these on her own.  If symptoms worsen would recommend that she see psychiatry.

## 2023-07-15 NOTE — Assessment & Plan Note (Signed)
Discuss smoking cessation options with patient.  She wishes to continue trying on her own.  She has tried patches and gum in the past.  She is open to medications and seeing Dr. Raymondo Band but would like to revisit this at a later time.

## 2023-07-16 ENCOUNTER — Encounter (HOSPITAL_COMMUNITY): Payer: Self-pay

## 2023-07-16 ENCOUNTER — Telehealth (HOSPITAL_COMMUNITY): Payer: 59 | Admitting: Physician Assistant

## 2023-07-17 ENCOUNTER — Ambulatory Visit (HOSPITAL_COMMUNITY): Payer: 59 | Attending: Cardiology

## 2023-07-17 DIAGNOSIS — R072 Precordial pain: Secondary | ICD-10-CM | POA: Diagnosis not present

## 2023-07-17 LAB — ECHOCARDIOGRAM COMPLETE
Area-P 1/2: 4.54 cm2
S' Lateral: 3.1 cm

## 2023-07-22 DIAGNOSIS — J449 Chronic obstructive pulmonary disease, unspecified: Secondary | ICD-10-CM | POA: Diagnosis not present

## 2023-07-22 DIAGNOSIS — R32 Unspecified urinary incontinence: Secondary | ICD-10-CM | POA: Diagnosis not present

## 2023-07-22 DIAGNOSIS — Z720 Tobacco use: Secondary | ICD-10-CM | POA: Diagnosis not present

## 2023-07-22 DIAGNOSIS — I2510 Atherosclerotic heart disease of native coronary artery without angina pectoris: Secondary | ICD-10-CM | POA: Diagnosis not present

## 2023-07-22 DIAGNOSIS — Z79899 Other long term (current) drug therapy: Secondary | ICD-10-CM | POA: Diagnosis not present

## 2023-07-22 DIAGNOSIS — Z809 Family history of malignant neoplasm, unspecified: Secondary | ICD-10-CM | POA: Diagnosis not present

## 2023-07-22 DIAGNOSIS — I10 Essential (primary) hypertension: Secondary | ICD-10-CM | POA: Diagnosis not present

## 2023-07-22 DIAGNOSIS — K219 Gastro-esophageal reflux disease without esophagitis: Secondary | ICD-10-CM | POA: Diagnosis not present

## 2023-07-22 DIAGNOSIS — Z880 Allergy status to penicillin: Secondary | ICD-10-CM | POA: Diagnosis not present

## 2023-07-22 DIAGNOSIS — M1990 Unspecified osteoarthritis, unspecified site: Secondary | ICD-10-CM | POA: Diagnosis not present

## 2023-07-22 DIAGNOSIS — F319 Bipolar disorder, unspecified: Secondary | ICD-10-CM | POA: Diagnosis not present

## 2023-07-22 DIAGNOSIS — E785 Hyperlipidemia, unspecified: Secondary | ICD-10-CM | POA: Diagnosis not present

## 2023-07-28 ENCOUNTER — Encounter: Payer: Self-pay | Admitting: Family Medicine

## 2023-07-29 ENCOUNTER — Ambulatory Visit (INDEPENDENT_AMBULATORY_CARE_PROVIDER_SITE_OTHER): Payer: 59 | Admitting: Student

## 2023-07-29 VITALS — BP 133/87 | HR 68 | Ht 64.0 in | Wt 206.4 lb

## 2023-07-29 DIAGNOSIS — L299 Pruritus, unspecified: Secondary | ICD-10-CM | POA: Diagnosis not present

## 2023-07-29 MED ORDER — FLUCONAZOLE 150 MG PO TABS
150.0000 mg | ORAL_TABLET | ORAL | 0 refills | Status: DC
Start: 2023-07-29 — End: 2023-08-12

## 2023-07-29 NOTE — Progress Notes (Unsigned)
  SUBJECTIVE:   CHIEF COMPLAINT / HPI:   Itching Sensation -Patient reports of itching and scratching x 3 weeks  -Has tried Benadryl and Calamine lotion  -Denies fever or chills  -No new deodorant, new lotion, new detergent or moisturizer  -Cannot even wear a bra  -Areas of pruritus: underneath breast, head, axillary  -No drainage -Has tried to keep area dry    PERTINENT  PMH / PSH:  HTN Migraines Hemangioma  Sinuses GERD PTSD Tobacco Use  Prediabetes  Mood disorder  Hyponatremia  GAD COPD  Bipolar 1   Patient Care Team: Vonna Drafts, MD as PCP - General (Family Medicine) OBJECTIVE:  BP 133/87   Pulse 68   Ht 5\' 4"  (1.626 m)   Wt 206 lb 6.4 oz (93.6 kg)   LMP  (LMP Unknown) Comment: on Megace  SpO2 99%   BMI 35.43 kg/m  Physical Exam  General: NAD, pleasant, able to participate in exam Respiratory: No respiratory distress Skin: warm and dry, erythema under breasts bilaterally, no drainage, swelling, no open areas of ulcerations Psych: Normal affect and mood  ASSESSMENT/PLAN:  Itching Assessment & Plan: Considered systemic causes of chronic pruritus without presence of rash including CKD, cholestasis, Hodgkin's lymphoma, polycythemia vera, HIV, hyperthyroidism.  Lacks other symptoms/signs associated with these, but will order screening labs.  Will treat for intertrigo, Diflucan. Keep dry and try to avoid clothing that would rub the area. Return in 2 weeks if not improving.   Orders: -     Comprehensive metabolic panel -     CBC with Differential/Platelet -     Fluconazole; Take 1 tablet (150 mg total) by mouth once a week for 14 days.  Dispense: 2 tablet; Refill: 0 -     TSH Rfx on Abnormal to Free T4   Return in about 2 weeks (around 08/12/2023), or if symptoms worsen or fail to improve. Alfredo Martinez, MD 07/30/2023, 12:10 PM PGY-3, [facility]

## 2023-07-29 NOTE — Patient Instructions (Addendum)
It was great to see you today! Thank you for choosing [facility] for your primary care.  Today we addressed: I will get some labwork as well to make sure there is not another cause of your symptoms  We will try the oral medication to take once a week for the next two weeks  Keep dry and use barrier creams when able   If you haven't already, sign up for My Chart to have easy access to your labs results, and communication with your primary care physician. We are checking some labs today. If they are abnormal, I will call you. If they are normal, I will send you a MyChart message (if it is active) or a letter in the mail. If you do not hear about your labs in the next 2 weeks, please call the office. I recommend that you always bring your medications to each appointment as this makes it easy to ensure you are on the correct medications and helps [place] not miss refills when you need them. Call the clinic at [phone] if your symptoms worsen or you have any concerns. Return in about 2 weeks (around 08/12/2023), or if symptoms worsen or fail to improve. Please arrive 15 minutes before your appointment to ensure smooth check in process.  We appreciate your efforts in making this happen.  Thank you for allowing me to participate in your care, Alfredo Martinez, MD 07/29/2023, 3:49 PM PGY-3, [facility]

## 2023-07-30 NOTE — Assessment & Plan Note (Signed)
Considered systemic causes of chronic pruritus without presence of rash including CKD, cholestasis, Hodgkin's lymphoma, polycythemia vera, HIV, hyperthyroidism.  Lacks other symptoms/signs associated with these, but will order screening labs.  Will treat for intertrigo, Diflucan. Keep dry and try to avoid clothing that would rub the area. Return in 2 weeks if not improving.

## 2023-08-01 ENCOUNTER — Other Ambulatory Visit: Payer: Self-pay | Admitting: Family Medicine

## 2023-08-01 DIAGNOSIS — M545 Low back pain, unspecified: Secondary | ICD-10-CM

## 2023-08-04 ENCOUNTER — Encounter: Payer: Self-pay | Admitting: Family Medicine

## 2023-08-04 ENCOUNTER — Telehealth: Payer: Self-pay | Admitting: Cardiology

## 2023-08-04 DIAGNOSIS — E782 Mixed hyperlipidemia: Secondary | ICD-10-CM

## 2023-08-04 DIAGNOSIS — Z79899 Other long term (current) drug therapy: Secondary | ICD-10-CM

## 2023-08-04 DIAGNOSIS — I2510 Atherosclerotic heart disease of native coronary artery without angina pectoris: Secondary | ICD-10-CM

## 2023-08-04 DIAGNOSIS — Z90722 Acquired absence of ovaries, bilateral: Secondary | ICD-10-CM

## 2023-08-04 MED ORDER — ROSUVASTATIN CALCIUM 10 MG PO TABS
10.0000 mg | ORAL_TABLET | Freq: Every day | ORAL | 3 refills | Status: DC
Start: 2023-08-04 — End: 2023-08-26

## 2023-08-04 NOTE — Telephone Encounter (Signed)
Pt's medication was sent to pt's pharmacy as requested. Confirmation received.  °

## 2023-08-04 NOTE — Telephone Encounter (Signed)
*  STAT* If patient is at the pharmacy, call can be transferred to refill team.   1. Which medications need to be refilled? (please list name of each medication and dose if known) rosuvastatin (CRESTOR) 10 MG tablet    2. Would you like to learn more about the convenience, safety, & potential cost savings by using the [facility]? No   3. Are you open to using the [facility] (Type [facility].) No   4. Which pharmacy/location (including street and city if local pharmacy) is medication to be sent to?  [id number] [place], [place] - [address]     5. Do they need a 30 day or 90 day supply? 90 day

## 2023-08-05 MED ORDER — ESTRADIOL 2 MG PO TABS
2.0000 mg | ORAL_TABLET | Freq: Every day | ORAL | 1 refills | Status: DC
Start: 2023-08-05 — End: 2023-10-27

## 2023-08-07 ENCOUNTER — Ambulatory Visit: Payer: 59 | Attending: Cardiology

## 2023-08-07 DIAGNOSIS — I2510 Atherosclerotic heart disease of native coronary artery without angina pectoris: Secondary | ICD-10-CM

## 2023-08-07 DIAGNOSIS — E782 Mixed hyperlipidemia: Secondary | ICD-10-CM | POA: Diagnosis not present

## 2023-08-07 DIAGNOSIS — Z79899 Other long term (current) drug therapy: Secondary | ICD-10-CM

## 2023-08-08 LAB — LIPID PANEL
Chol/HDL Ratio: 2.5 ratio (ref 0.0–4.4)
Cholesterol, Total: 134 mg/dL (ref 100–199)
HDL: 53 mg/dL (ref >39)
LDL Chol Calc (NIH): 68 mg/dL (ref 0–99)
Triglycerides: 63 mg/dL (ref 0–149)
VLDL Cholesterol Cal: 13 mg/dL (ref 5–40)

## 2023-08-11 NOTE — Progress Notes (Unsigned)
NEUROLOGY FOLLOW UP OFFICE NOTE  Mariah Rice [id number]  Assessment/Plan:   Migraine without aura, without status migrainosus, not intractable. Doing well but since blood pressure has been difficult to control, will change from Aimovig to Emgality Hypertension   Migraine prevention:  Emgality *** Migraine rescue:  rizatriptan 10mg ; Zofran for nausea. *** Limit use of pain relievers to no more than 2 days out of week to prevent risk of rebound or medication-overuse headache. Keep headache diary ***       Subjective:  Mariah Rice is a 52 year old female with COPD, HTN and hyperthyroidism who follows up for migraines.  UPDATE: Due to insurance preference, Aimovig was switched to [employer]   *** Intensity:  mild Duration:  5 minutes (doesn't need rizatriptan) - doesn't progress to full blown headache *** Frequency:  once every 2 weeks *** Frequency of abortive medication: none Current NSAIDS/analgesics:  none Current triptans:  rizatriptan 10mg  Current ergotamine:  none Current anti-emetic:  ondansetron 4mg  Current muscle relaxants:  methocarbamol 750mg  Current Antihypertensive medications:  none Current Antidepressant medications:  amitriptyline 10mg  QHS (sleep) Current Anticonvulsant medications:  lamotrigine 100mg  daily, oxycarbazepine 600mg  BID Current anti-CGRP:  Aimovig 140mg  Current Vitamins/Herbal/Supplements:  none Current Antihistamines/Decongestants:  hydroxyzine, Flonase, Zyrtec Other therapy:  none Hormone/birth control:  estradiol Other medications:  albuterol     Caffeine:  2 cups coffee daily, [place] iced tea  Alcohol:  occasional  Smoker:  1 ppd Diet:  No soda.  Water Exercise:  tries to walk but just had foot surgery Depression:  yes; Anxiety:  yes.  Bipolar disorder Other pain:  neck pain, shoulder pain bilaterally Sleep hygiene:  yes.  Takes amitriptyline  HISTORY:  Onset:  her 30s Location:  starts in back of eyes and spreads to top of  head Quality:  pounding Intensity:  severe.   Aura:  absent Prodrome:  absent Associated symptoms:  Photophobia, phonophobia, nausea.  She denies associated unilateral numbness or weakness. Duration:  1 day Frequency:  every 2 days Frequency of abortive medication: Tylenol 4 days a week Triggers:  unknown Relieving factors:  cold rag on forehead and rest in dark room Activity:  cannot function about once a week   MRI of brain with and without contrast on 10/31/2020 personally reviewed was unremarkable.   Also history of neck and right sided cervical radicular pain.  MRI of cervical spine on 12/26/2021 personally reviewed revealed degenerative changes at C5-6 with mild spinal stenosis and severe right/moderate left neural foraminal stenosis as well as mild degenerative changes at C4-5 without  significant spinal canal or neural foraminal stenosis.     Past NSAIDS/analgesics:  Fioricet, ibuprofen, naproxen, acetaminophen, diclofenac, tramadol Past abortive triptans:  sumatriptan 100mg  Past abortive ergotamine:  none Past muscle relaxants:  tizanidine, cyclobenzaprine Past anti-emetic:  promethazine Past antihypertensive medications:  propranolol ER 60mg  daily (80mg  exacerbated cough and concern with COPD), amlodipine, HCTZ Past antidepressant medications:  duloxetine, sertraline, bupropion, citalopram Past anticonvulsant medications:  topiramate, Depakote, gabapentin Past anti-CGRP:  Aimovig 140mg  (effective - insurance problem) Past vitamins/Herbal/Supplements:  none Past antihistamines/decongestants:  Benadryl Other past therapies:  none    Family history of headache:  mom  PAST MEDICAL HISTORY: Past Medical History:  Diagnosis Date   Abnormal finding on GI tract imaging    Anaphylaxis 07/26/2020   Anxiety    COPD (chronic obstructive pulmonary disease) (HCC)    COPD exacerbation (HCC) 07/17/2020   Dental abscess    Depression    Family  history of breast cancer    Family  history of breast cancer 01/26/2019   Family history of ovarian cancer    Genetic testing 03/08/2019   Negative genetic testing on the CancerNext-Expanded+RNAinsight testing.  The CancerNext-Expanded gene panel offered by [employer] and includes sequencing and rearrangement analysis for the following 67 genes: AIP, ALK, APC*, ATM*, BAP1, BARD1, BLM, BMPR1A, BRCA1*, BRCA2*, BRIP1*, CDH1*, CDK4, CDKN1B, CDKN2A, CHEK2*, DICER1, FANCC, FH, FLCN, GALNT12, HOXB13, MAX, MEN1, MET, MLH1*, MRE11A, MSH2*   GERD (gastroesophageal reflux disease)    Headache    migraines   Hypertension    Hyperthyroidism 1990s   PONV (postoperative nausea and vomiting)    Shortness of breath dyspnea    with anxiety   Small bowel obstruction (HCC)    Tick bite of abdomen 05/13/2017    MEDICATIONS: Current Outpatient Medications on File Prior to Visit  Medication Sig Dispense Refill   albuterol (VENTOLIN HFA) 108 (90 Base) MCG/ACT inhaler Inhale 2 puffs into the lungs every 6 (six) hours as needed for wheezing or shortness of breath. 8 g 1   amLODipine (NORVASC) 5 MG tablet TAKE 1 TABLET DAILY. INCREASE TO 2 TABS DAILY IF BLOOD PRESSURE > 140/90 AFTER 1 WEEK. 60 tablet 5   busPIRone (BUSPAR) 15 MG tablet Take 1.5 tablets (22.5 mg total) by mouth 2 (two) times daily. 90 tablet 3   cetirizine (ZYRTEC) 10 MG tablet TAKE 1 TABLET BY MOUTH EVERY DAY 90 tablet 1   cloNIDine (CATAPRES) 0.1 MG tablet TAKE 1 TABLET BY MOUTH TWICE A DAY 60 tablet 5   EPINEPHrine 0.3 mg/0.3 mL IJ SOAJ injection Inject 0.3 mg into the muscle as needed for anaphylaxis (for difficulty breathing, throat, tongue or lip swelling. must come to ED after use.). 1 each 0   estradiol (ESTRACE) 2 MG tablet Take 1 tablet (2 mg total) by mouth daily. 90 tablet 1   fluconazole (DIFLUCAN) 150 MG tablet Take 1 tablet (150 mg total) by mouth once a week for 14 days. 2 tablet 0   fluticasone (FLONASE) 50 MCG/ACT nasal spray USE 1 SPRAY IN EACH NOSTRIL ONCE DAILY  FOR 14 DAYS (Patient taking differently: Place 1 spray into both nostrils at bedtime.) 48 mL 3   Galcanezumab-gnlm (EMGALITY) 120 MG/ML SOAJ Inject 120 mg into the skin every 28 (twenty-eight) days. 1.12 mL 11   lamoTRIgine (LAMICTAL) 100 MG tablet Take 1.5 tablets (150 mg total) by mouth daily. 45 tablet 3   methocarbamol (ROBAXIN) 750 MG tablet TAKE 1 TABLET TWO TIMES DAILY AS NEEDED 60 tablet 0   metoprolol tartrate (LOPRESSOR) 100 MG tablet Take 1 tablet (100 mg total) by mouth once for 1 dose. Take 90-120 minutes prior to scan. 1 tablet 0   omeprazole (PRILOSEC) 20 MG capsule Take 1 capsule (20 mg total) by mouth daily. 30 capsule 2   ondansetron (ZOFRAN) 4 MG tablet Take 1 tablet (4 mg total) by mouth every 8 (eight) hours as needed for nausea or vomiting. 20 tablet 5   oxcarbazepine (TRILEPTAL) 600 MG tablet TAKE 1 TABLET BY MOUTH TWICE A DAY 60 tablet 3   predniSONE (STERAPRED UNI-PAK 21 TAB) 10 MG (21) TBPK tablet take 60mg  day 1, then 50mg  day 2, then 40mg  day 3, then 30mg  day 4, then 20mg  day 5, then 10mg  day 6, then STOP 21 tablet 0   rizatriptan (MAXALT-MLT) 10 MG disintegrating tablet Take 1 tablet (10 mg total) by mouth as needed for migraine (May repeat in 2  hours.  Maximum 2 tablets in 24 hours.). May repeat in 2 hours if needed 10 tablet 5   rosuvastatin (CRESTOR) 10 MG tablet Take 1 tablet (10 mg total) by mouth daily. 90 tablet 3   Semaglutide-Weight Management (WEGOVY) 0.25 MG/0.5ML SOAJ Inject 0.25 mg into the skin once a week. 2 mL 0   SPIRIVA RESPIMAT 2.5 MCG/ACT AERS INHALE 2 PUFFS BY MOUTH INTO THE LUNGS DAILY 30 each 3   No current facility-administered medications on file prior to visit.     ALLERGIES: Allergies  Allergen Reactions   Amoxicillin Anaphylaxis   Azithromycin Anaphylaxis   Meloxicam Nausea And Vomiting   Sumatriptan Nausea Only, Rash and Other (See Comments)    Throat felt like it was closing up   Penicillins Nausea Only and Rash    Has patient had  a PCN reaction causing immediate rash, facial/tongue/throat swelling, SOB or lightheadedness with hypotension: no Has patient had a PCN reaction causing severe rash involving mucus membranes or skin necrosis:unknown Has patient had a PCN reaction that required hospitalization : yes Has patient had a PCN reaction occurring within the last 10 years: no If all of the above answers are "NO", then may proceed with Cephalosporin use.    Sulfa Antibiotics Nausea Only and Rash    FAMILY HISTORY: Family History  Problem Relation Age of Onset   Ovarian cancer Mother 32       d. 82   Heart disease Father    Hypertension Father    COPD Father    Hypertension Paternal Aunt 79   Lung cancer Paternal Aunt 64   Breast cancer Paternal Aunt 70   Thyroid disease Maternal Grandmother    Lymphoma Maternal Grandmother 35       NHL, recurrance at 40   Lung cancer Maternal Grandmother 61   Hypertension Paternal Grandmother    Heart disease Paternal Grandmother    Heart Problems Paternal Grandmother    Dementia Paternal Grandmother    Colon cancer Neg Hx    Esophageal cancer Neg Hx    Stomach cancer Neg Hx    Pancreatic cancer Neg Hx       Objective:  *** General: No acute distress.  Patient appears well-groomed.   Head:  Normocephalic/atraumatic Neck:  Supple.  No paraspinal tenderness.  Full range of motion. Heart:  Regular rate and rhythm. Neuro:  Alert and oriented.  Speech fluent and not dysarthric.  Language intact.  CN II-XII intact.  Bulk and tone normal.  Muscle strength 5/5 throughout.  Deep tendon reflexes 2+ throughout.  Gait normal.  Romberg negative.    Shon Millet, DO  CC: Vonna Drafts, MD

## 2023-08-12 ENCOUNTER — Encounter (HOSPITAL_COMMUNITY): Payer: Self-pay | Admitting: Physician Assistant

## 2023-08-12 ENCOUNTER — Telehealth (INDEPENDENT_AMBULATORY_CARE_PROVIDER_SITE_OTHER): Payer: 59 | Admitting: Physician Assistant

## 2023-08-12 ENCOUNTER — Ambulatory Visit: Payer: 59 | Admitting: Neurology

## 2023-08-12 ENCOUNTER — Encounter: Payer: Self-pay | Admitting: Neurology

## 2023-08-12 VITALS — BP 139/91 | HR 70 | Resp 20 | Wt 208.0 lb

## 2023-08-12 DIAGNOSIS — F411 Generalized anxiety disorder: Secondary | ICD-10-CM

## 2023-08-12 DIAGNOSIS — G43001 Migraine without aura, not intractable, with status migrainosus: Secondary | ICD-10-CM | POA: Diagnosis not present

## 2023-08-12 DIAGNOSIS — F3130 Bipolar disorder, current episode depressed, mild or moderate severity, unspecified: Secondary | ICD-10-CM | POA: Diagnosis not present

## 2023-08-12 MED ORDER — OXCARBAZEPINE 600 MG PO TABS
600.0000 mg | ORAL_TABLET | Freq: Two times a day (BID) | ORAL | 3 refills | Status: DC
Start: 2023-08-12 — End: 2023-11-11

## 2023-08-12 MED ORDER — CLONAZEPAM 0.5 MG PO TABS
0.5000 mg | ORAL_TABLET | Freq: Every evening | ORAL | 0 refills | Status: DC | PRN
Start: 2023-08-12 — End: 2023-09-25

## 2023-08-12 MED ORDER — RIZATRIPTAN BENZOATE 10 MG PO TBDP: 10.0000 mg | ORAL_TABLET | ORAL | 5 refills | Status: DC | PRN
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
## 2023-08-12 NOTE — Patient Instructions (Addendum)
Emgality every 28 days Rizatriptan as needed Limit use of pain relievers to no more than 2 days out of week to prevent risk of rebound or medication-overuse headache. Keep headache diary Follow up 6-7 months

## 2023-08-19 ENCOUNTER — Other Ambulatory Visit: Payer: Self-pay | Admitting: Student

## 2023-08-19 DIAGNOSIS — L299 Pruritus, unspecified: Secondary | ICD-10-CM

## 2023-08-26 ENCOUNTER — Telehealth: Payer: Self-pay | Admitting: Cardiology

## 2023-08-26 DIAGNOSIS — E782 Mixed hyperlipidemia: Secondary | ICD-10-CM

## 2023-08-26 DIAGNOSIS — Z79899 Other long term (current) drug therapy: Secondary | ICD-10-CM

## 2023-08-26 DIAGNOSIS — I2510 Atherosclerotic heart disease of native coronary artery without angina pectoris: Secondary | ICD-10-CM

## 2023-08-26 MED ORDER — ROSUVASTATIN CALCIUM 10 MG PO TABS
10.0000 mg | ORAL_TABLET | Freq: Every day | ORAL | 2 refills | Status: DC
Start: 2023-08-26 — End: 2024-10-05

## 2023-08-26 NOTE — Telephone Encounter (Signed)
Pt's medication was sent to pt's pharmacy as requested. Confirmation received.  °

## 2023-08-26 NOTE — Telephone Encounter (Signed)
*  STAT* If patient is at the pharmacy, call can be transferred to refill team.   1. Which medications need to be refilled? (please list name of each medication and dose if known) Rosuvastatin   Would you like to learn more about the convenience, safety, & potential cost savings by using the [facility]?     3. Are you open to using the [facility] (Type [facility]. ).   4. Which pharmacy/location (including street and city if local pharmacy) is medication to be sent to? [facility] [address], [place]   5. Do they need a 30 day or 90 day supply? 90 days and refills- patient last appointment was June [id number]

## 2023-09-03 ENCOUNTER — Ambulatory Visit (INDEPENDENT_AMBULATORY_CARE_PROVIDER_SITE_OTHER): Payer: 59 | Admitting: Family Medicine

## 2023-09-03 ENCOUNTER — Encounter: Payer: Self-pay | Admitting: Family Medicine

## 2023-09-03 VITALS — BP 128/74 | HR 70 | Ht 64.0 in | Wt 206.5 lb

## 2023-09-03 DIAGNOSIS — Z23 Encounter for immunization: Secondary | ICD-10-CM

## 2023-09-03 DIAGNOSIS — L570 Actinic keratosis: Secondary | ICD-10-CM

## 2023-09-03 NOTE — Progress Notes (Signed)
    SUBJECTIVE:   CHIEF COMPLAINT / HPI:   Here for checkup Feeling well overall Psychiatrist started klonopin which has really been helping her Estradiol working well  Spot on L shoulder Noticed L shoulder spot a couple of months ago Has not increased in size or changed in color over that period time No bleeding, drainage, blistering The area is not painful but occasionally gets itchy Has not tried any creams She does have a smoking history and reports that she often goes to the beach and used to use tanning beds often  PERTINENT  PMH / PSH: HTN, HLD, obesity, bipolar disorder, s/p bilateral oophorectomy  OBJECTIVE:   BP 128/74   Pulse 70   Ht 5\' 4"  (1.626 m)   Wt 206 lb 8 oz (93.7 kg)   LMP  (LMP Unknown) Comment: on Megace  SpO2 100%   BMI 35.45 kg/m    General: NAD, pleasant, able to participate in exam Cardiac: RRR, no murmurs auscultated Respiratory: CTAB, normal WOB Abdomen: soft, non-tender, non-distended, normoactive bowel sounds Extremities: warm and well perfused, no edema or cyanosis Skin: Left shoulder with 1 cm round scaly lesion, nontender, no bleeding or drainage, fairly well-defined borders with no significant discoloration, no underlying induration or fluctuance Neuro: alert, no obvious focal deficits, speech normal Psych: Normal affect and mood     ASSESSMENT/PLAN:   Assessment & Plan Actinic keratosis Left shoulder.  Exam is consistent with AK although she is at higher risk of skin cancer given her smoking history, and significant exposure to sunlight/radiation through tanning beds in the past.  However, does not appear to be melanoma, squamous or basal cell carcinoma.  Cryotherapy as below, follow-up in 3 weeks.  If no improvement, may consider referral to dermatology or skin clinic for biopsy. Encounter for immunization Received flu shot today  Diagnosis: Actinic keratosis Procedure: Cryotherapy Location: Left shoulder  After discussion of  the risks, benefits, and alternative therapies available, the patient elected to proceed. After obtaining written informed consent, the patient's identity, procedure, and site were verified during a time out prior to proceeding procedure. The lesion on the left shoulder were treated using liquid nitrogen spray gun for 6 second per cycle, 3 cycles total. The patient tolerated the procedure well and there were no immediate complications.  Patient was advised to return for follow-up in 3 weeks.  Vonna Drafts, MD [facility]

## 2023-09-03 NOTE — Assessment & Plan Note (Signed)
Left shoulder.  Exam is consistent with AK although she is at higher risk of skin cancer given her smoking history, and significant exposure to sunlight/radiation through tanning beds in the past.  However, does not appear to be melanoma, squamous or basal cell carcinoma.  Cryotherapy as below, follow-up in 3 weeks.  If no improvement, may consider referral to dermatology or skin clinic for biopsy.

## 2023-09-03 NOTE — Patient Instructions (Addendum)
Please follow up in about 3 weeks to check on your skin rash

## 2023-09-23 ENCOUNTER — Encounter: Payer: Self-pay | Admitting: Family Medicine

## 2023-09-23 ENCOUNTER — Ambulatory Visit (INDEPENDENT_AMBULATORY_CARE_PROVIDER_SITE_OTHER): Payer: 59 | Admitting: Family Medicine

## 2023-09-23 VITALS — BP 125/76 | HR 81 | Ht 64.0 in | Wt 201.4 lb

## 2023-09-23 DIAGNOSIS — G8929 Other chronic pain: Secondary | ICD-10-CM | POA: Diagnosis not present

## 2023-09-23 DIAGNOSIS — Z23 Encounter for immunization: Secondary | ICD-10-CM | POA: Diagnosis not present

## 2023-09-23 DIAGNOSIS — Z1231 Encounter for screening mammogram for malignant neoplasm of breast: Secondary | ICD-10-CM

## 2023-09-23 DIAGNOSIS — L570 Actinic keratosis: Secondary | ICD-10-CM | POA: Diagnosis not present

## 2023-09-23 DIAGNOSIS — M545 Low back pain, unspecified: Secondary | ICD-10-CM | POA: Diagnosis not present

## 2023-09-23 MED ORDER — METHOCARBAMOL 750 MG PO TABS
ORAL_TABLET | ORAL | 0 refills | Status: DC
Start: 2023-09-23 — End: 2023-11-09

## 2023-09-23 MED ORDER — SHINGRIX 50 MCG/0.5ML IM SUSR
INTRAMUSCULAR | 1 refills | Status: DC
Start: 2023-09-23 — End: 2024-09-20

## 2023-09-23 NOTE — Progress Notes (Signed)
    SUBJECTIVE:   CHIEF COMPLAINT / HPI:   F/u for actinic keratosis noted at last visit 9/18 At that visit cryotherapy was performed Since then the lesion has blistered a little.  Still no pain or itching  Got a job at [employer]   PERTINENT  PMH / PSH: Hypertension, hyperlipidemia  OBJECTIVE:   BP 125/76   Pulse 81   Ht 5\' 4"  (1.626 m)   Wt 201 lb 6 oz (91.3 kg)   LMP  (LMP Unknown) Comment: on Megace  SpO2 100%   BMI 34.57 kg/m    General: NAD, pleasant, able to participate in exam Respiratory: No respiratory distress Skin: Left shoulder with 1 cm round lesion, blistered, no fluid collection or underlying fluctuance/induration.  No current bleeding or drainage.  See image below Psych: Normal affect and mood    ASSESSMENT/PLAN:   Assessment & Plan Actinic keratosis Appropriately blistering and healing after cryotherapy 3 weeks ago.  Continue to follow over time.  Provided return precautions for infection.  Discussed use of barrier cream. F/u at future visits Screening mammogram for breast cancer Ordered mammogram today Need for shingles vaccine Provided printed Rx   Vonna Drafts, MD [facility]

## 2023-09-23 NOTE — Patient Instructions (Signed)
Please let us know if the area gets infected or starts causing pain.  Please also let us know if it grows in size.  It may take a couple of months to heal completely and we will keep an eye on it  Please use Vaseline or another barrier cream to prevent it from rubbing up against your clothes.

## 2023-09-23 NOTE — Assessment & Plan Note (Addendum)
Appropriately blistering and healing after cryotherapy 3 weeks ago.  Continue to follow over time.  Provided return precautions for infection.  Discussed use of barrier cream. F/u at future visits

## 2023-09-24 ENCOUNTER — Other Ambulatory Visit (HOSPITAL_COMMUNITY): Payer: Self-pay | Admitting: Physician Assistant

## 2023-09-24 ENCOUNTER — Telehealth (HOSPITAL_COMMUNITY): Payer: Self-pay | Admitting: *Deleted

## 2023-09-24 DIAGNOSIS — F411 Generalized anxiety disorder: Secondary | ICD-10-CM

## 2023-09-24 NOTE — Telephone Encounter (Signed)
Patient called asking for refill of Clonazepam. Next appointment in November.

## 2023-09-25 NOTE — Telephone Encounter (Signed)
Message acknowledged and reviewed.

## 2023-10-06 ENCOUNTER — Encounter: Payer: Self-pay | Admitting: Family Medicine

## 2023-10-06 DIAGNOSIS — K219 Gastro-esophageal reflux disease without esophagitis: Secondary | ICD-10-CM

## 2023-10-06 MED ORDER — OMEPRAZOLE 20 MG PO CPDR
20.0000 mg | DELAYED_RELEASE_CAPSULE | Freq: Every day | ORAL | 2 refills | Status: DC
Start: 2023-10-06 — End: 2023-12-01

## 2023-10-08 ENCOUNTER — Encounter: Payer: Self-pay | Admitting: Family Medicine

## 2023-10-08 DIAGNOSIS — N644 Mastodynia: Secondary | ICD-10-CM

## 2023-10-09 ENCOUNTER — Other Ambulatory Visit: Payer: Self-pay | Admitting: Family Medicine

## 2023-10-09 ENCOUNTER — Encounter: Payer: Self-pay | Admitting: Family Medicine

## 2023-10-09 DIAGNOSIS — N644 Mastodynia: Secondary | ICD-10-CM

## 2023-10-15 ENCOUNTER — Encounter: Payer: Self-pay | Admitting: Family Medicine

## 2023-10-17 ENCOUNTER — Other Ambulatory Visit: Payer: Self-pay | Admitting: Family Medicine

## 2023-10-17 DIAGNOSIS — N644 Mastodynia: Secondary | ICD-10-CM | POA: Diagnosis not present

## 2023-10-27 ENCOUNTER — Other Ambulatory Visit: Payer: Self-pay | Admitting: Family Medicine

## 2023-10-27 DIAGNOSIS — Z90722 Acquired absence of ovaries, bilateral: Secondary | ICD-10-CM

## 2023-10-29 ENCOUNTER — Other Ambulatory Visit: Payer: 59

## 2023-10-29 ENCOUNTER — Encounter: Payer: Self-pay | Admitting: Family Medicine

## 2023-10-30 ENCOUNTER — Ambulatory Visit (INDEPENDENT_AMBULATORY_CARE_PROVIDER_SITE_OTHER): Payer: 59 | Admitting: Student

## 2023-10-30 VITALS — BP 142/79 | HR 62 | Ht 64.0 in | Wt 202.8 lb

## 2023-10-30 DIAGNOSIS — M5489 Other dorsalgia: Secondary | ICD-10-CM | POA: Diagnosis not present

## 2023-10-30 NOTE — Patient Instructions (Addendum)
It was great to see you! Thank you for allowing me to participate in your care!  I recommend that you always bring your medications to each appointment as this makes it easy to ensure we are on the correct medications and helps [place] not miss when refills are needed.  Our plans for today:  - Back Pain  Back imaging  [facility] [address] [place], [place] [address]  [phone]    Pain Management   -Muscle relaxer to be used as needed    Take your Robaxin as needed    *Will make you sleep, don't drive or do important task   -Voltaren gel to be used throughout day   -Continue lidocaine patches   -Ibuprofen 800 mg three times a day    *Max dose of 2400 mg in a day      Take care and seek immediate care sooner if you develop any concerns.   Dr. Bess Kinds, MD [facility]

## 2023-10-30 NOTE — Progress Notes (Signed)
  SUBJECTIVE:   CHIEF COMPLAINT / HPI:   Back Pain Has sclerosis in her back, but upper back hurts and when she presses on it, it goes down her leg. Lifting right leg causing pain and throbbing. Back pain got worse 4-5 days ago. Standing on her feet is causing a lot of pain. Has tried muscle rubs and icey hot without relief. No fevers, body aches, chills or systemic symptoms. Notes significant neck ache that runs down to her right leg. No recent hx of truama or accident. Leg get's numb and tingles/aches. Normal bathroom habits.    PERTINENT  PMH / PSH:    OBJECTIVE:  LMP  (LMP Unknown) Comment: on [facility] Physical Exam Constitutional:      General: She is in acute distress.     Appearance: She is not ill-appearing.  Musculoskeletal:     Cervical back: Tenderness and bony tenderness present. No swelling, edema, deformity, erythema, signs of trauma, lacerations, rigidity, spasms or torticollis. Pain with movement present. Decreased range of motion.     Thoracic back: Tenderness and bony tenderness present. No swelling, edema, deformity, signs of trauma, lacerations or spasms. Decreased range of motion.     Lumbar back: Tenderness and bony tenderness present. No swelling, edema, deformity, signs of trauma or spasms. Decreased range of motion. Positive right straight leg raise test. Negative left straight leg raise test.  Neurological:     Mental Status: She is alert.      ASSESSMENT/PLAN:   Assessment & Plan Midline back pain, unspecified back location, unspecified chronicity Patient comes in with 4 to 5 days of upper back pain, that radiates down her leg.  Patient reports history of sclerosis and, and chronic pain, but notes it has been worse recently.  Patient denies any history of trauma, or systemic symptoms of infection.  Patient with normal bathroom habits.  Patient with tenderness to palpation overlying midline of back, in all areas, with exquisite tenderness in the cervical/thoracic  area.  Concern for possible compression fracture possibly secondary to osteoporosis, or worsening OA.  Low concern for muscular etiology given midline bony tenderness.  Will obtain imaging and recommend over-the-counter pain meds. - DG cervical/thoracic/lumbar spine - Over-the-counter pain meds (Voltaren gel, ibuprofen, lidocaine patch) - Encourage Robaxin use, as patient has this medication at home No follow-ups on file. Bess Kinds, MD 10/30/2023, 8:39 AM PGY-3, [facility]

## 2023-10-30 NOTE — Assessment & Plan Note (Addendum)
Patient comes in with 4 to 5 days of upper back pain, that radiates down her leg.  Patient reports history of sclerosis and, and chronic pain, but notes it has been worse recently.  Patient denies any history of trauma, or systemic symptoms of infection.  Patient with normal bathroom habits.  Patient with tenderness to palpation overlying midline of back, in all areas, with exquisite tenderness in the cervical/thoracic area.  Concern for possible compression fracture possibly secondary to osteoporosis, or worsening OA.  Low concern for muscular etiology given midline bony tenderness.  Will obtain imaging and recommend over-the-counter pain meds. - DG cervical/thoracic/lumbar spine - Over-the-counter pain meds (Voltaren gel, ibuprofen, lidocaine patch) - Encourage Robaxin use, as patient has this medication at home

## 2023-10-31 ENCOUNTER — Other Ambulatory Visit: Payer: Self-pay | Admitting: Student

## 2023-10-31 ENCOUNTER — Encounter: Payer: Self-pay | Admitting: Student

## 2023-10-31 ENCOUNTER — Ambulatory Visit (HOSPITAL_COMMUNITY): Payer: 59

## 2023-10-31 ENCOUNTER — Encounter (HOSPITAL_COMMUNITY): Payer: Self-pay

## 2023-10-31 ENCOUNTER — Ambulatory Visit
Admission: RE | Admit: 2023-10-31 | Discharge: 2023-10-31 | Disposition: A | Discharge status: Home / Self Care | Payer: 59 | Source: Ambulatory Visit | Attending: Family Medicine | Admitting: Family Medicine

## 2023-10-31 DIAGNOSIS — M5489 Other dorsalgia: Secondary | ICD-10-CM

## 2023-10-31 DIAGNOSIS — M549 Dorsalgia, unspecified: Secondary | ICD-10-CM | POA: Diagnosis not present

## 2023-10-31 DIAGNOSIS — M47816 Spondylosis without myelopathy or radiculopathy, lumbar region: Secondary | ICD-10-CM | POA: Diagnosis not present

## 2023-10-31 DIAGNOSIS — M5412 Radiculopathy, cervical region: Secondary | ICD-10-CM | POA: Diagnosis not present

## 2023-11-09 ENCOUNTER — Other Ambulatory Visit: Payer: Self-pay | Admitting: Family Medicine

## 2023-11-09 DIAGNOSIS — G8929 Other chronic pain: Secondary | ICD-10-CM

## 2023-11-10 MED ORDER — METHOCARBAMOL 750 MG PO TABS: ORAL_TABLET | ORAL | 0 refills | Status: DC

## 2023-11-11 ENCOUNTER — Telehealth (INDEPENDENT_AMBULATORY_CARE_PROVIDER_SITE_OTHER): Payer: 59 | Admitting: Physician Assistant

## 2023-11-11 DIAGNOSIS — F411 Generalized anxiety disorder: Secondary | ICD-10-CM | POA: Diagnosis not present

## 2023-11-11 DIAGNOSIS — F3130 Bipolar disorder, current episode depressed, mild or moderate severity, unspecified: Secondary | ICD-10-CM | POA: Diagnosis not present

## 2023-11-11 MED ORDER — OXCARBAZEPINE 600 MG PO TABS: 600.0000 mg | ORAL_TABLET | Freq: Two times a day (BID) | ORAL | 3 refills | Status: DC

## 2023-11-11 MED ORDER — CLONAZEPAM 0.5 MG PO TABS: 0.5000 mg | ORAL_TABLET | Freq: Every day | ORAL | 0 refills | Status: DC | PRN

## 2023-11-12 ENCOUNTER — Encounter (HOSPITAL_COMMUNITY): Payer: Self-pay | Admitting: Physician Assistant
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
## 2023-11-14 ENCOUNTER — Telehealth: Payer: 59 | Admitting: Family Medicine

## 2023-11-14 DIAGNOSIS — J441 Chronic obstructive pulmonary disease with (acute) exacerbation: Secondary | ICD-10-CM | POA: Diagnosis not present

## 2023-11-14 MED ORDER — GUAIFENESIN 200 MG PO TABS: 200.0000 mg | ORAL_TABLET | Freq: Four times a day (QID) | ORAL | 0 refills | Status: AC | PRN

## 2023-11-14 MED ORDER — PREDNISONE 10 MG (21) PO TBPK: ORAL_TABLET | ORAL | 0 refills | Status: DC

## 2023-11-14 MED ORDER — BENZONATATE 100 MG PO CAPS: 100.0000 mg | ORAL_CAPSULE | Freq: Three times a day (TID) | ORAL | 0 refills | Status: AC | PRN

## 2023-11-14 MED ORDER — DOXYCYCLINE HYCLATE 100 MG PO TABS: 100.0000 mg | ORAL_TABLET | Freq: Two times a day (BID) | ORAL | 0 refills | Status: AC

## 2023-11-14 NOTE — Patient Instructions (Signed)
Ashwini M Dizdarevic, thank you for joining Reed Pandy, PA-C for today's virtual visit.  While this provider is not your primary care provider (PCP), if your PCP is located in our provider database this encounter information will be shared with them immediately following your visit.   A [facility] MyChart account gives you access to today's visit and all your visits, tests, and labs performed at [facility] " click here if you don't have a [facility] MyChart account or go to [email or web address]  Consent: (Patient) Mariah Rice provided verbal consent for this virtual visit at the beginning of the encounter.  Current Medications:  Current Outpatient Medications:    albuterol (VENTOLIN HFA) 108 (90 Base) MCG/ACT inhaler, Inhale 2 puffs into the lungs every 6 (six) hours as needed for wheezing or shortness of breath., Disp: 8 g, Rfl: 1   amLODipine (NORVASC) 5 MG tablet, TAKE 1 TABLET DAILY. INCREASE TO 2 TABS DAILY IF BLOOD PRESSURE > 140/90 AFTER 1 WEEK., Disp: 60 tablet, Rfl: 5   cetirizine (ZYRTEC) 10 MG tablet, TAKE 1 TABLET BY MOUTH EVERY DAY (Patient not taking: Reported on 09/03/2023), Disp: 90 tablet, Rfl: 1   clonazePAM (KLONOPIN) 0.5 MG tablet, Take 1 tablet (0.5 mg total) by mouth daily as needed for anxiety., Disp: 30 tablet, Rfl: 0   cloNIDine (CATAPRES) 0.1 MG tablet, TAKE 1 TABLET BY MOUTH TWICE A DAY, Disp: 60 tablet, Rfl: 5   EPINEPHrine 0.3 mg/0.3 mL IJ SOAJ injection, Inject 0.3 mg into the muscle as needed for anaphylaxis (for difficulty breathing, throat, tongue or lip swelling. must come to ED after use.)., Disp: 1 each, Rfl: 0   estradiol (ESTRACE) 2 MG tablet, TAKE 1 TABLET BY MOUTH EVERY DAY, Disp: 90 tablet, Rfl: 0   fluticasone (FLONASE) 50 MCG/ACT nasal spray, USE 1 SPRAY IN EACH NOSTRIL ONCE DAILY FOR 14 DAYS (Patient taking differently: Place 1 spray into both nostrils at bedtime.), Disp: 48 mL, Rfl: 3   Galcanezumab-gnlm (EMGALITY) 120 MG/ML SOAJ,  Inject 120 mg into the skin every 28 (twenty-eight) days., Disp: 1.12 mL, Rfl: 11   methocarbamol (ROBAXIN) 750 MG tablet, Take 1 tablet two times daily as needed, Disp: 60 tablet, Rfl: 0   metoprolol tartrate (LOPRESSOR) 100 MG tablet, Take 1 tablet (100 mg total) by mouth once for 1 dose. Take 90-120 minutes prior to scan., Disp: 1 tablet, Rfl: 0   omeprazole (PRILOSEC) 20 MG capsule, Take 1 capsule (20 mg total) by mouth daily., Disp: 30 capsule, Rfl: 2   ondansetron (ZOFRAN) 4 MG tablet, Take 1 tablet (4 mg total) by mouth every 8 (eight) hours as needed for nausea or vomiting., Disp: 20 tablet, Rfl: 5   oxcarbazepine (TRILEPTAL) 600 MG tablet, Take 1 tablet (600 mg total) by mouth 2 (two) times daily., Disp: 60 tablet, Rfl: 3   rizatriptan (MAXALT-MLT) 10 MG disintegrating tablet, Take 1 tablet (10 mg total) by mouth as needed for migraine (May repeat in 2 hours.  Maximum 2 tablets in 24 hours.). May repeat in 2 hours if needed, Disp: 10 tablet, Rfl: 5   rosuvastatin (CRESTOR) 10 MG tablet, Take 1 tablet (10 mg total) by mouth daily., Disp: 90 tablet, Rfl: 2   Semaglutide-Weight Management (WEGOVY) 0.25 MG/0.5ML SOAJ, Inject 0.25 mg into the skin once a week. (Patient not taking: Reported on 08/12/2023), Disp: 2 mL, Rfl: 0   SPIRIVA RESPIMAT 2.5 MCG/ACT AERS, INHALE 2 PUFFS BY MOUTH INTO THE LUNGS DAILY, Disp: 30 each, Rfl: 3  Zoster Vaccine Adjuvanted [facility]) injection, Administer Shingrix vaccination now and repeat in two months, Disp: 1 each, Rfl: 1   Medications ordered in this encounter:  No orders of the defined types were placed in this encounter.    *If you need refills on other medications prior to your next appointment, please contact your pharmacy*  Follow-Up: Call back or seek an in-person evaluation if the symptoms worsen or if the condition fails to improve as anticipated.   Virtual Care [phone]  Other Instructions Chronic Obstructive Pulmonary Disease  Exacerbation  Chronic obstructive pulmonary disease (COPD) is a long-term (chronic) lung problem. In COPD, the flow of air from the lungs is limited. COPD exacerbations are times that breathing gets worse and you need more than your normal treatment. Without treatment, they can be life-threatening. If they happen often, your lungs can become more damaged. What are the causes? Having infections that affect your airways and lungs. Being exposed to: Smoke. Air pollution. Chemical fumes. Dust. Things that can cause an allergic reaction (allergens). Not taking your usual COPD medicines as told. Having medical problems already, such as heart failure or infections not involving the lungs. In many cases, the cause is not known. What increases the risk? Smoking. Being an older adult. Having frequent prior COPD exacerbations. What are the signs or symptoms? Increased coughing. Increased mucus from your lungs. Increased wheezing. Increased shortness of breath. Fast breathing and finding it hard to breathe. Chest tightness. Less energy than usual. Sleep disruption from symptoms. Confusion. Increased sleepiness. Often, these symptoms happen or get worse even with the use of medicines. How is this treated? Treatment for this condition depends on how bad it is and the cause of the symptoms. You may need to stay in the hospital for treatment. Treatment may include: Taking medicines. Using oxygen. Being treated with different ways to clear your airway, such as using a mask to deliver oxygen. Follow these instructions at home: Medicines Take over-the-counter and prescription medicines only as told by your doctor. Use all inhaled medicines the correct way. If you were prescribed an antibiotic or steroid medicine, take it as told by your doctor. Do not stop taking it even if you start to feel better. Lifestyle Do not smoke or use any products that contain nicotine or tobacco. If you need help  quitting, ask your doctor. Eat healthy foods. Exercise regularly. Get enough sleep. Most adults need 7 or more hours per night. Avoid tobacco smoke and other things that can bother your lungs. Several times a day, wash your hands with soap and water for at least 20 seconds. If you cannot use soap and water, use hand sanitizer. This may help keep you from getting an infection. During flu season, avoid areas that are crowded with people. General instructions Drink enough fluid to keep your pee (urine) pale yellow. Do not do this if your doctor has told you not to. Use a cool mist machine (vaporizer). If you use oxygen or a machine that turns medicine into a mist (nebulizer), continue to use it as told. Keep all follow-up visits. How is this prevented? Keep up with shots (vaccinations) as told by your doctor. Be sure to get a yearly flu (influenza) shot. If you smoke, quit smoking. Smoking makes the problem worse. Follow all instructions for rehabilitation. These are steps you can take to make your body work better. Work with your doctor to develop and follow an action plan. This tells you what steps to take when  you experience certain symptoms. Contact a doctor if: Your COPD symptoms get worse than normal. Get help right away if: You are short of breath and it gets worse, even when you are resting. You have trouble talking. You have chest pain. You cough up blood. You have a fever. You keep vomiting. You feel weak or you pass out (faint). You feel confused. You are not able to sleep because of your symptoms. You have trouble doing daily activities. These symptoms may be an emergency. Get help right away. Call your local emergency services (911 in the [place] Do not wait to see if the symptoms will go away. Do not drive yourself to the hospital. Summary COPD exacerbations are times that breathing gets worse and you need more treatment than normal. COPD exacerbations can be very serious  and may cause your lungs to become more damaged. Do not smoke. If you need help quitting, ask your doctor. Stay up to date on your shots. Get a flu shot every year. This information is not intended to replace advice given to you by your health care provider. Make sure you discuss any questions you have with your health care provider. Document Revised: 10/25/2020 Document Reviewed: 10/10/2020 Elsevier Patient Education  2024 Elsevier Inc.    If you have been instructed to have an in-person evaluation today at a local Urgent Care facility, please use the link below. It will take you to a list of all of our available [facility], including address, phone number and hours of operation. Please do not delay care.  [facility]  If you or a family member do not have a primary care provider, use the link below to schedule a visit and establish care. When you choose a [facility] primary care physician or advanced practice provider, you gain a long-term partner in health. Find a Primary Care Provider  Learn more about [facility]'s in-office and virtual care options: [facility] - Get Care Now

## 2023-11-14 NOTE — Progress Notes (Signed)
Virtual Visit Consent   Mariah Rice, you are scheduled for a virtual visit with a [facility] provider today. Just as with appointments in the office, your consent must be obtained to participate. Your consent will be active for this visit and any virtual visit you may have with one of our providers in the next 365 days. If you have a MyChart account, a copy of this consent can be sent to you electronically.  As this is a virtual visit, video technology does not allow for your provider to perform a traditional examination. This may limit your provider's ability to fully assess your condition. If your provider identifies any concerns that need to be evaluated in person or the need to arrange testing (such as labs, EKG, etc.), we will make arrangements to do so. Although advances in technology are sophisticated, we cannot ensure that it will always work on either your end or our end. If the connection with a video visit is poor, the visit may have to be switched to a telephone visit. With either a video or telephone visit, we are not always able to ensure that we have a secure connection.  By engaging in this virtual visit, you consent to the provision of healthcare and authorize for your insurance to be billed (if applicable) for the services provided during this visit. Depending on your insurance coverage, you may receive a charge related to this service.  I need to obtain your verbal consent now. Are you willing to proceed with your visit today? Mariah Rice has provided verbal consent on 11/14/2023 for a virtual visit (video or telephone). Reed Pandy, [place]  Date: 11/14/2023 11:04 AM  Virtual Visit via Video Note   I, Reed Pandy, connected with  Mariah Rice  ([id number], [birth year]) on 11/14/23 at 11:00 AM EST by a video-enabled telemedicine application and verified that I am speaking with the correct person using two identifiers.  Location: Patient: Virtual Visit Location Patient:  Home Provider: Virtual Visit Location Provider: Home Office   I discussed the limitations of evaluation and management by telemedicine and the availability of in person appointments. The patient expressed understanding and agreed to proceed.    History of Present Illness: Mariah Rice is a 52 y.o. who identifies as a female who was assigned female at birth, and is being seen today for c/o Since last night has been feeling really bad.  Pt states she has no energy and is coughing really bad.  Pt states she called into work this morning because she is really ill.  Pt denies fever, N/V.  Pt states she has a small headache. Pt states she is coughing up yellow mucus. Pt states she has a little difficulty breathing because she has COPD and has used her emergency inhaler more often than she usually does. Pt states she has increased wheezing, especially when laying down.   HPI: HPI  Problems:  Patient Active Problem List   Diagnosis Date Noted   Actinic keratosis 09/03/2023   Liver hemangioma 07/15/2023   Chest pain 05/23/2023   Class 2 severe obesity with serious comorbidity and body mass index (BMI) of 36.0 to 36.9 in adult [facility]) 02/06/2023   Sleep disturbance 10/16/2022   PTSD (post-traumatic stress disorder) 05/29/2022   Bipolar I disorder, most recent episode depressed (HCC) 05/29/2022   Generalized abdominal pain    Small bowel intussusception (HCC)    Pre-operative clearance 10/11/2021   Closed nondisplaced fracture of right calcaneus with routine healing  01/05/2021   Plantar fasciitis 09/12/2020   Sinus disorder 08/02/2020   Bilateral leg edema 08/02/2020   Anxiety and depression    Abnormal finding on GI tract imaging    Hyponatremia 05/03/2019   COPD suggested by initial evaluation (HCC) 02/15/2019   Chronic bilateral low back pain without sciatica 06/06/2018   Prediabetes 05/31/2018   Migraine without aura and with status migrainosus, not intractable 11/30/2017   Itching  05/13/2017   Neck pain 03/26/2017   Mood disorder (HCC) 10/06/2016   Right ear pain 07/23/2016   GERD (gastroesophageal reflux disease) 10/19/2015   H/O bilateral oophorectomy 08/13/2015   Midline back pain 03/15/2015   GAD (generalized anxiety disorder) 01/06/2013   Tobacco abuse 11/10/2012   Obesity (BMI 35.0-39.9 without comorbidity) 11/10/2012   Hypertension 11/10/2012    Allergies:  Allergies  Allergen Reactions   Amoxicillin Anaphylaxis   Azithromycin Anaphylaxis   Meloxicam Nausea And Vomiting   Sumatriptan Nausea Only, Rash and Other (See Comments)    Throat felt like it was closing up   Penicillins Nausea Only and Rash    Has patient had a PCN reaction causing immediate rash, facial/tongue/throat swelling, SOB or lightheadedness with hypotension: no Has patient had a PCN reaction causing severe rash involving mucus membranes or skin necrosis:unknown Has patient had a PCN reaction that required hospitalization : yes Has patient had a PCN reaction occurring within the last 10 years: no If all of the above answers are "NO", then may proceed with Cephalosporin use.    Sulfa Antibiotics Nausea Only and Rash   Medications:  Current Outpatient Medications:    albuterol (VENTOLIN HFA) 108 (90 Base) MCG/ACT inhaler, Inhale 2 puffs into the lungs every 6 (six) hours as needed for wheezing or shortness of breath., Disp: 8 g, Rfl: 1   amLODipine (NORVASC) 5 MG tablet, TAKE 1 TABLET DAILY. INCREASE TO 2 TABS DAILY IF BLOOD PRESSURE > 140/90 AFTER 1 WEEK., Disp: 60 tablet, Rfl: 5   benzonatate (TESSALON) 100 MG capsule, Take 1 capsule (100 mg total) by mouth 3 (three) times daily as needed for up to 7 days for cough., Disp: 21 capsule, Rfl: 0   cetirizine (ZYRTEC) 10 MG tablet, TAKE 1 TABLET BY MOUTH EVERY DAY (Patient not taking: Reported on 09/03/2023), Disp: 90 tablet, Rfl: 1   clonazePAM (KLONOPIN) 0.5 MG tablet, Take 1 tablet (0.5 mg total) by mouth daily as needed for anxiety.,  Disp: 30 tablet, Rfl: 0   cloNIDine (CATAPRES) 0.1 MG tablet, TAKE 1 TABLET BY MOUTH TWICE A DAY, Disp: 60 tablet, Rfl: 5   doxycycline (VIBRA-TABS) 100 MG tablet, Take 1 tablet (100 mg total) by mouth 2 (two) times daily for 7 days., Disp: 14 tablet, Rfl: 0   EPINEPHrine 0.3 mg/0.3 mL IJ SOAJ injection, Inject 0.3 mg into the muscle as needed for anaphylaxis (for difficulty breathing, throat, tongue or lip swelling. must come to ED after use.)., Disp: 1 each, Rfl: 0   estradiol (ESTRACE) 2 MG tablet, TAKE 1 TABLET BY MOUTH EVERY DAY, Disp: 90 tablet, Rfl: 0   fluticasone (FLONASE) 50 MCG/ACT nasal spray, USE 1 SPRAY IN EACH NOSTRIL ONCE DAILY FOR 14 DAYS (Patient taking differently: Place 1 spray into both nostrils at bedtime.), Disp: 48 mL, Rfl: 3   Galcanezumab-gnlm (EMGALITY) 120 MG/ML SOAJ, Inject 120 mg into the skin every 28 (twenty-eight) days., Disp: 1.12 mL, Rfl: 11   guaiFENesin 200 MG tablet, Take 1 tablet (200 mg total) by mouth every 6 (six)  hours as needed for up to 7 days for cough or to loosen phlegm., Disp: 28 tablet, Rfl: 0   methocarbamol (ROBAXIN) 750 MG tablet, Take 1 tablet two times daily as needed, Disp: 60 tablet, Rfl: 0   metoprolol tartrate (LOPRESSOR) 100 MG tablet, Take 1 tablet (100 mg total) by mouth once for 1 dose. Take 90-120 minutes prior to scan., Disp: 1 tablet, Rfl: 0   omeprazole (PRILOSEC) 20 MG capsule, Take 1 capsule (20 mg total) by mouth daily., Disp: 30 capsule, Rfl: 2   ondansetron (ZOFRAN) 4 MG tablet, Take 1 tablet (4 mg total) by mouth every 8 (eight) hours as needed for nausea or vomiting., Disp: 20 tablet, Rfl: 5   oxcarbazepine (TRILEPTAL) 600 MG tablet, Take 1 tablet (600 mg total) by mouth 2 (two) times daily., Disp: 60 tablet, Rfl: 3   predniSONE (STERAPRED UNI-PAK 21 TAB) 10 MG (21) TBPK tablet, Take following package directions., Disp: 21 tablet, Rfl: 0   rizatriptan (MAXALT-MLT) 10 MG disintegrating tablet, Take 1 tablet (10 mg total) by mouth  as needed for migraine (May repeat in 2 hours.  Maximum 2 tablets in 24 hours.). May repeat in 2 hours if needed, Disp: 10 tablet, Rfl: 5   rosuvastatin (CRESTOR) 10 MG tablet, Take 1 tablet (10 mg total) by mouth daily., Disp: 90 tablet, Rfl: 2   Semaglutide-Weight Management (WEGOVY) 0.25 MG/0.5ML SOAJ, Inject 0.25 mg into the skin once a week. (Patient not taking: Reported on 08/12/2023), Disp: 2 mL, Rfl: 0   SPIRIVA RESPIMAT 2.5 MCG/ACT AERS, INHALE 2 PUFFS BY MOUTH INTO THE LUNGS DAILY, Disp: 30 each, Rfl: 3   Zoster Vaccine Adjuvanted [facility]) injection, Administer Shingrix vaccination now and repeat in two months, Disp: 1 each, Rfl: 1  Observations/Objective: Patient is well-developed, well-nourished in no acute distress.  Resting comfortably at home.  Head is normocephalic, atraumatic.  No labored breathing. Speech is clear and coherent with logical content.  Patient is alert and oriented at baseline.    Assessment and Plan: 1. COPD with acute exacerbation (HCC) - predniSONE (STERAPRED UNI-PAK 21 TAB) 10 MG (21) TBPK tablet; Take following package directions.  Dispense: 21 tablet; Refill: 0 - guaiFENesin 200 MG tablet; Take 1 tablet (200 mg total) by mouth every 6 (six) hours as needed for up to 7 days for cough or to loosen phlegm.  Dispense: 28 tablet; Refill: 0 - benzonatate (TESSALON) 100 MG capsule; Take 1 capsule (100 mg total) by mouth 3 (three) times daily as needed for up to 7 days for cough.  Dispense: 21 capsule; Refill: 0 - doxycycline (VIBRA-TABS) 100 MG tablet; Take 1 tablet (100 mg total) by mouth 2 (two) times daily for 7 days.  Dispense: 14 tablet; Refill: 0  -Advised Pt if worsening symptoms to proceed to urgent care or emergency room hospital  -Pt verbalized understanding  Follow Up Instructions: I discussed the assessment and treatment plan with the patient. The patient was provided an opportunity to ask questions and all were answered. The patient agreed with  the plan and demonstrated an understanding of the instructions.  A copy of instructions were sent to the patient via MyChart unless otherwise noted below.     The patient was advised to call back or seek an in-person evaluation if the symptoms worsen or if the condition fails to improve as anticipated.    Reed Pandy, PA-C

## 2023-11-16 ENCOUNTER — Encounter: Payer: Self-pay | Admitting: Neurology

## 2023-11-17 ENCOUNTER — Ambulatory Visit (HOSPITAL_COMMUNITY)
Admission: RE | Admit: 2023-11-17 | Discharge: 2023-11-17 | Disposition: A | Discharge status: Home / Self Care | Payer: 59 | Source: Ambulatory Visit | Attending: Family Medicine | Admitting: Family Medicine

## 2023-11-17 ENCOUNTER — Telehealth: Payer: Self-pay

## 2023-11-17 ENCOUNTER — Encounter: Payer: Self-pay | Admitting: Student

## 2023-11-17 ENCOUNTER — Ambulatory Visit (INDEPENDENT_AMBULATORY_CARE_PROVIDER_SITE_OTHER): Payer: 59 | Admitting: Student

## 2023-11-17 VITALS — BP 136/78 | HR 68 | Temp 98.0°F | Ht 64.0 in | Wt 198.4 lb

## 2023-11-17 DIAGNOSIS — J449 Chronic obstructive pulmonary disease, unspecified: Secondary | ICD-10-CM

## 2023-11-17 DIAGNOSIS — J441 Chronic obstructive pulmonary disease with (acute) exacerbation: Secondary | ICD-10-CM | POA: Diagnosis not present

## 2023-11-17 MED ORDER — SPIRIVA RESPIMAT 2.5 MCG/ACT IN AERS: INHALATION_SPRAY | RESPIRATORY_TRACT | 3 refills | Status: DC

## 2023-11-17 MED ORDER — ALBUTEROL SULFATE (2.5 MG/3ML) 0.083% IN NEBU
2.5000 mg | INHALATION_SOLUTION | Freq: Once | RESPIRATORY_TRACT | Status: AC
  Administered 2023-11-17: 2.5 mg via RESPIRATORY_TRACT

## 2023-11-17 MED ORDER — PREDNISONE 20 MG PO TABS: 40.0000 mg | ORAL_TABLET | Freq: Every day | ORAL | 0 refills | Status: AC

## 2023-11-17 MED ORDER — SODIUM CHLORIDE 0.9 % IV SOLN: 40.0000 mg | Freq: Once | INTRAVENOUS | Status: DC

## 2023-11-17 MED ORDER — METHYLPREDNISOLONE ACETATE 40 MG/ML IJ SUSP
40.0000 mg | Freq: Once | INTRAMUSCULAR | Status: AC
  Administered 2023-11-17: 40 mg via INTRAMUSCULAR

## 2023-11-17 MED ORDER — IPRATROPIUM BROMIDE 0.02 % IN SOLN
0.5000 mg | Freq: Once | RESPIRATORY_TRACT | Status: AC
  Administered 2023-11-17: 0.5 mg via RESPIRATORY_TRACT

## 2023-11-17 MED ORDER — IPRATROPIUM-ALBUTEROL 0.5-2.5 (3) MG/3ML IN SOLN: 3.0000 mL | Freq: Once | RESPIRATORY_TRACT | Status: DC

## 2023-11-17 NOTE — Telephone Encounter (Signed)
Per patient mychart message, Could u please send me new insurance company a authorization form please for my emgality, I haven't had it for couple days. My new insurance is [employer] and [employer] Yenni Hice my id for this [id number].. member code is [id number] and number is [id number].. please!!   Thank u Darl Dena

## 2023-11-17 NOTE — Addendum Note (Signed)
Addended by: Veronda Prude on: 11/17/2023 04:20 PM   Modules accepted: Orders

## 2023-11-17 NOTE — Progress Notes (Signed)
    SUBJECTIVE:   CHIEF COMPLAINT / HPI: Cough   Worsening respiratory symptoms that started after Thanksgiving. The patient reports a severe cough, wheezing, and facial redness. The patient sought care via telemedicine and was prescribed Virtual visit 11/29 dx with COPD exacerbation treated with sterapred pack, doxycycline 100 BID for 7 days, tessalon and guaifenesin which she reports has not been effective. The patient also mentions using an emergency inhaler multiple times a day before starting these medications. The patient also mentions a recent inability to afford her Spiriva medication, which she has been without for about two weeks.she has been coughing up yellow phlegm during this time and no blood in the sputum.  The patient has been unable to work due to her symptoms and has tested negative for COVID-19. Denies any fevers.  PERTINENT  PMH / PSH: COPD, HTN  OBJECTIVE:   BP 136/78   Pulse 68   Temp 98 F (36.7 C)   Ht 5\' 4"  (1.626 m)   Wt 198 lb 6.4 oz (90 kg)   LMP  (LMP Unknown) Comment: on Megace  SpO2 99%   BMI 34.06 kg/m   General: ill but nontoxic, NAD, awake, alert, responsive to questions Head: Normocephalic atraumatic, nasal congestion present, moist mucous membranes CV: Regular rate and rhythm no murmurs rubs or gallops Respiratory: Diffuse wheezing throughout posterior lung fields, chest rises symmetrically,  no increased work of breathing room air saturating at high 90s  After [employer] and Depo injection: Respiratory: Mild wheezing throughout posterior lung fields, improved from previous examination, speaking full sentences   ASSESSMENT/PLAN:   Assessment & Plan COPD exacerbation (HCC) Most likely COPD exacerbation given cough with productive sputum and significant wheezing on examination.  Other differential includes pneumonia although patient has not been having any fevers.  Viral etiology possible as well as mentions a couple of her relatives have had similar  symptoms. - predniSONE (DELTASONE) 20 MG tablet; Take 2 tablets (40 mg total) by mouth daily with breakfast for 4 days.  Dispense: 8 tablet; Refill: 0 - continue doxycycline - ipratropium-albuterol (DUONEB) 0.5-2.5 (3) MG/3ML nebulizer solution 3 mL (given today) - DG Chest 2 View; Future - methylPREDNISolone acetate (DEPO-MEDROL) injection 40 mg (given today) - SPIRIVA RESPIMAT 2.5 MCG/ACT AERS; INHALE 2 PUFFS BY MOUTH INTO THE LUNGS DAILY  Dispense: 30 each; Refill: 3  - 1 week follow up-may benefit from Trelegy    Levin Erp, MD [facility]

## 2023-11-17 NOTE — Patient Instructions (Addendum)
It was great to see you! Thank you for allowing me to participate in your care!   Our plans for today:  -We gave you a injection of steroid today and I am sending you in a prednisone 40 mg for 4 days -Continue your doxycycline as you have been prescribed -We gave you a breathing treatment today in clinic -We will get a chest x-ray today as well-go to [facility] or [facility] imaging to obtain this -Please return to care if you are having worsening shortness of breath, chest pain, fevers -1 week follow up  Take care and seek immediate care sooner if you develop any concerns.  Levin Erp, MD

## 2023-11-18 ENCOUNTER — Encounter: Payer: Self-pay | Admitting: Student

## 2023-11-18 ENCOUNTER — Other Ambulatory Visit: Payer: Self-pay

## 2023-11-18 MED ORDER — ALBUTEROL SULFATE HFA 108 (90 BASE) MCG/ACT IN AERS: 2.0000 | INHALATION_SPRAY | Freq: Four times a day (QID) | RESPIRATORY_TRACT | 1 refills | Status: DC | PRN

## 2023-11-20 ENCOUNTER — Other Ambulatory Visit (HOSPITAL_COMMUNITY): Payer: Self-pay

## 2023-11-20 ENCOUNTER — Telehealth: Payer: Self-pay

## 2023-11-20 NOTE — Telephone Encounter (Signed)
PA request has been Submitted. New Encounter created for follow up. For additional info see Pharmacy Prior Auth telephone encounter from 12/05.

## 2023-11-20 NOTE — Telephone Encounter (Signed)
*  [facility]  Pharmacy Patient Advocate Encounter   Received notification from Pt Calls Messages that prior authorization for Emgality 120MG /ML auto-injectors (migraine)  is required/requested.   Insurance verification completed.   The patient is insured through [facility] .   Per test claim: PA required; PA submitted to above mentioned insurance via CoverMyMeds Key/confirmation #/EOC [id number] Status is pending

## 2023-11-21 NOTE — Telephone Encounter (Signed)
Pharmacy Patient Advocate Encounter  Received notification from [facility] that Prior Authorization for Emgality 120MG /ML auto-injectors (migraine)  has been APPROVED from 11/21/2023 to 02/13/2024

## 2023-11-25 ENCOUNTER — Telehealth: Payer: Self-pay

## 2023-11-25 ENCOUNTER — Ambulatory Visit (INDEPENDENT_AMBULATORY_CARE_PROVIDER_SITE_OTHER): Payer: 59 | Admitting: Student

## 2023-11-25 VITALS — BP 135/84 | HR 69 | Ht 64.0 in | Wt 200.5 lb

## 2023-11-25 DIAGNOSIS — J449 Chronic obstructive pulmonary disease, unspecified: Secondary | ICD-10-CM

## 2023-11-25 NOTE — Patient Instructions (Signed)
It was great to see you! Thank you for allowing me to participate in your care!   Our plans for today:  - Continue inhalers as you have been doing -We will wait for formal read of chest xray I am also ordering low dose CT scan for lung cancer screening  Take care and seek immediate care sooner if you develop any concerns.  Levin Erp, MD

## 2023-11-25 NOTE — Assessment & Plan Note (Signed)
Exacerbation seems to be resolving after getting steroids and antibiotics last week.  Chest x-ray reviewed but final read is not back.  No substantial pneumonia or nodules that are seen.  Will await final read. -Low-dose screening CT ordered -Continue current regimen of Spiriva, consider repeat PFTs after exacerbations past -ED/return precautions discussed -Encouraged smoking cessation

## 2023-11-25 NOTE — Telephone Encounter (Signed)
Attempted to reach pt. No answer. LVM of CT scan at [facility] for Fri 13th at 1:00. Aquilla Solian, CMA

## 2023-11-25 NOTE — Progress Notes (Signed)
    SUBJECTIVE:   CHIEF COMPLAINT / HPI: COPD exacerbation f/u  Presents for follow-up after a recent exacerbation treated last week. They report persistent hacking cough, which has improved but still occurs in spells. The cough is productive, with the patient describing the sputum as 'gluey, mucousy.' They deny any hemoptysis. The cough is associated with urinary incontinence, requiring the use of bladder pads.  The patient was previously treated with doxycycline and a course of prednisone, which they completed. They also use Spiriva as a maintenance inhaler for their COPD. They state had tried Trelegy in the past but discontinued it due to oral thrush and does not want to try again. They deny any fevers.  PERTINENT  PMH / PSH: smoker,   OBJECTIVE:   BP 135/84   Pulse 69   Ht 5\' 4"  (1.626 m)   Wt 200 lb 8 oz (90.9 kg)   LMP  (LMP Unknown) Comment: on Megace  SpO2 98%   BMI 34.42 kg/m   General: NAD, awake, alert, responsive to questions Head: Normocephalic atraumatic CV: Regular rate and rhythm no murmurs rubs or gallops Respiratory: Clear to ausculation bilaterally, no wheezes rales or crackles, chest rises symmetrically,  no increased work of breathing on RA Extremities: Moves upper and lower extremities freely, no edema in LE  ASSESSMENT/PLAN:   Assessment & Plan COPD suggested by initial evaluation (HCC) Exacerbation seems to be resolving after getting steroids and antibiotics last week.  Chest x-ray reviewed but final read is not back.  No substantial pneumonia or nodules that are seen.  Will await final read. -Low-dose screening CT ordered -Continue current regimen of Spiriva, consider repeat PFTs after exacerbations past -ED/return precautions discussed -Encouraged smoking cessation   Levin Erp, MD [facility]

## 2023-11-27 ENCOUNTER — Ambulatory Visit (HOSPITAL_COMMUNITY)
Admission: RE | Admit: 2023-11-27 | Discharge: 2023-11-27 | Disposition: A | Discharge status: Home / Self Care | Payer: Self-pay | Source: Ambulatory Visit | Attending: Family Medicine | Admitting: Family Medicine

## 2023-11-27 ENCOUNTER — Encounter: Payer: Self-pay | Admitting: Family Medicine

## 2023-11-27 ENCOUNTER — Encounter: Payer: Self-pay | Admitting: Student

## 2023-11-27 DIAGNOSIS — J449 Chronic obstructive pulmonary disease, unspecified: Secondary | ICD-10-CM | POA: Insufficient documentation

## 2023-11-28 ENCOUNTER — Ambulatory Visit (HOSPITAL_COMMUNITY): Payer: 59

## 2023-12-01 ENCOUNTER — Other Ambulatory Visit: Payer: Self-pay | Admitting: Family Medicine

## 2023-12-01 DIAGNOSIS — K219 Gastro-esophageal reflux disease without esophagitis: Secondary | ICD-10-CM

## 2023-12-05 ENCOUNTER — Other Ambulatory Visit: Payer: Self-pay | Admitting: Family Medicine

## 2023-12-05 DIAGNOSIS — I10 Essential (primary) hypertension: Secondary | ICD-10-CM

## 2023-12-08 ENCOUNTER — Other Ambulatory Visit: Payer: Self-pay | Admitting: Family Medicine

## 2023-12-08 ENCOUNTER — Ambulatory Visit: Payer: Self-pay | Admitting: Family Medicine

## 2023-12-08 VITALS — BP 130/88 | HR 66 | Ht 64.0 in | Wt 196.1 lb

## 2023-12-08 DIAGNOSIS — G8929 Other chronic pain: Secondary | ICD-10-CM

## 2023-12-08 DIAGNOSIS — E66812 Obesity, class 2: Secondary | ICD-10-CM

## 2023-12-08 DIAGNOSIS — Z6836 Body mass index (BMI) 36.0-36.9, adult: Secondary | ICD-10-CM

## 2023-12-08 DIAGNOSIS — M545 Low back pain, unspecified: Secondary | ICD-10-CM

## 2023-12-08 DIAGNOSIS — J449 Chronic obstructive pulmonary disease, unspecified: Secondary | ICD-10-CM

## 2023-12-08 DIAGNOSIS — M2550 Pain in unspecified joint: Secondary | ICD-10-CM

## 2023-12-08 LAB — POCT GLYCOSYLATED HEMOGLOBIN (HGB A1C): HbA1c, POC (prediabetic range): 5.9 % (ref 5.7–6.4)

## 2023-12-08 MED ORDER — WEGOVY 0.25 MG/0.5ML ~~LOC~~ SOAJ: 0.2500 mg | SUBCUTANEOUS | 0 refills | Status: DC

## 2023-12-08 MED ORDER — METHOCARBAMOL 750 MG PO TABS: ORAL_TABLET | ORAL | 0 refills | Status: DC

## 2023-12-08 NOTE — Assessment & Plan Note (Signed)
Patient has indeed been losing weight but would still benefit from GLP-1 given her significant obesity with BMI above 30.  Patient was previously denied as she did not have insurance and was unemployed, however she is now employed and has insurance.  Prescribed Wegovy-if successful starting this, follow-up in 4 weeks for titration

## 2023-12-08 NOTE — Progress Notes (Signed)
    SUBJECTIVE:   CHIEF COMPLAINT / HPI:   Arthralgias - hands and feet hurt especially in the mornings.  Worst in right hand and right foot.  Does notice some swelling and stiffness. Does endorse some occasional numbness/tingling in the mornings. She used to type a lot at work but doesn't do this as much anymore. She does sleep with her head rested on her hand/wrist.  She is interested in disability for this  Was seen a couple of weeks ago for ?COPD - feeling better. Taking spiriva. CT chest still not read   Obesity-patient has indeed been losing weight (intentionally) over the past several months but has still been having difficulty with maintaining a healthy diet and exercise regimen   PERTINENT  PMH / PSH: Prediabetes, obesity  OBJECTIVE:   BP 130/88   Pulse 66   Ht 5\' 4"  (1.626 m)   Wt 196 lb 2 oz (89 kg)   LMP  (LMP Unknown) Comment: on Megace  SpO2 100%   BMI 33.66 kg/m   General: NAD, pleasant, able to participate in exam Cardiac: RRR, no murmurs auscultated Respiratory: CTAB, normal WOB Abdomen: soft, non-tender, non-distended, normoactive bowel sounds Extremities: warm and well perfused, no edema or cyanosis.  Hands and feet are nontender to palpation with normal range of motion, without any significant swelling.  Tinel and Phalen's test negative bilaterally Skin: warm and dry, no rashes noted Neuro: alert, no obvious focal deficits, speech normal Psych: Normal affect and mood  ASSESSMENT/PLAN:   Assessment & Plan Arthralgia, unspecified joint Given symptoms of stiffness and pain worse in the early morning, suspicious for OA versus neuropathy versus carpal tunnel.  A1c is 5.9 today making diabetic neuropathy less likely.  Will obtain radiographs of her right foot and right hand (as these bother her the most) to evaluate for OA or other bony abnormalities.  Patient does follow with a neurologist; I did suggest that she mention her symptoms to her neurologist as they may  consider doing nerve conduction studies if neuropathy is suspected.  Patient prefers to speak with neurologist prior to starting medications like gabapentin.  Otherwise, advised continued use of oral NSAIDs and Voltaren gel.  Refilled Robaxin for chronic muscle spasms Class 2 severe obesity with serious comorbidity and body mass index (BMI) of 36.0 to 36.9 in adult, unspecified obesity type [facility]) Patient has indeed been losing weight but would still benefit from GLP-1 given her significant obesity with BMI above 30.  Patient was previously denied as she did not have insurance and was unemployed, however she is now employed and has insurance.  Prescribed Wegovy-if successful starting this, follow-up in 4 weeks for titration COPD suggested by initial evaluation (HCC) Stable, exam unremarkable, continue Spiriva.  Follow-up CT chest when result is available.    Vonna Drafts, MD [facility]

## 2023-12-08 NOTE — Assessment & Plan Note (Signed)
Stable, exam unremarkable, continue Spiriva.  Follow-up CT chest when result is available.

## 2023-12-08 NOTE — Patient Instructions (Signed)
Please go to [facility] [facility] at your convenience to get x-rays done  Please use voltaren gel regularly. You can also try to get a wrist brace to keep your wrist in neutral position

## 2023-12-09 ENCOUNTER — Encounter: Payer: Self-pay | Admitting: Family Medicine

## 2023-12-12 ENCOUNTER — Other Ambulatory Visit (HOSPITAL_COMMUNITY): Payer: Self-pay

## 2023-12-12 ENCOUNTER — Telehealth: Payer: Self-pay

## 2023-12-12 NOTE — Telephone Encounter (Signed)
Pharmacy Patient Advocate Encounter   Received notification from Physician's Office that prior authorization for [facility] is required/requested.   The patient is insured through [employer] [facility] .   Per test claim: PA required; PA started via CoverMyMeds. KEY [id number] . Waiting for clinical questions to populate.

## 2023-12-15 NOTE — Telephone Encounter (Signed)
Pharmacy Patient Advocate Encounter   PA required; PA submitted to above mentioned insurance via CoverMyMeds Key/confirmation #/EOC [id number]. Status is pending

## 2023-12-15 NOTE — Telephone Encounter (Signed)
Refill request sent

## 2023-12-18 ENCOUNTER — Other Ambulatory Visit (HOSPITAL_COMMUNITY): Payer: Self-pay

## 2023-12-18 NOTE — Telephone Encounter (Signed)
 Pharmacy Patient Advocate Encounter  Received notification from [employer] [facility] that Prior Authorization for [facility] 0.25MG  has been DENIED.  Full denial letter will be uploaded to the media tab. See denial reason below.

## 2023-12-21 ENCOUNTER — Encounter: Payer: Self-pay | Admitting: Neurology

## 2023-12-25 ENCOUNTER — Encounter: Payer: Self-pay | Admitting: Neurology

## 2023-12-26 ENCOUNTER — Other Ambulatory Visit (HOSPITAL_COMMUNITY): Payer: Self-pay

## 2024-01-13 ENCOUNTER — Other Ambulatory Visit (HOSPITAL_COMMUNITY): Payer: Self-pay | Admitting: Physician Assistant

## 2024-01-13 DIAGNOSIS — F411 Generalized anxiety disorder: Secondary | ICD-10-CM

## 2024-01-19 ENCOUNTER — Telehealth: Payer: Self-pay

## 2024-01-19 ENCOUNTER — Other Ambulatory Visit (HOSPITAL_COMMUNITY): Payer: Self-pay

## 2024-01-19 NOTE — Telephone Encounter (Signed)
PA request has been Submitted. New Encounter created for follow up. For additional info see Pharmacy Prior Auth telephone encounter from 02/03.

## 2024-01-19 NOTE — Telephone Encounter (Signed)
*  [facility]  Pharmacy Patient Advocate Encounter   Received notification from Patient Advice Request messages that prior authorization for Emgality 120MG /ML auto-injectors (migraine)  is required/requested.   Insurance verification completed.   The patient is insured through [employer] [facility] .   Per test claim: PA required; PA started via CoverMyMeds. KEY [id number] . Waiting for clinical questions to populate.   *Continuing dose covered through 07/04/2024, this request is for the loading dose as

## 2024-01-31 ENCOUNTER — Other Ambulatory Visit: Payer: Self-pay | Admitting: Family Medicine

## 2024-01-31 DIAGNOSIS — Z90722 Acquired absence of ovaries, bilateral: Secondary | ICD-10-CM

## 2024-02-03 ENCOUNTER — Other Ambulatory Visit (HOSPITAL_COMMUNITY): Payer: Self-pay

## 2024-02-03 NOTE — Telephone Encounter (Signed)
100% PATIENT COINSURANCE([employer]) - PATIENT SHOULD CONTACT HEALTH INSURANCE PLAN WITH QUESTIONS PRIOR AUTHORIZATION EXPIRES ON 07/04/24

## 2024-02-11 ENCOUNTER — Telehealth (INDEPENDENT_AMBULATORY_CARE_PROVIDER_SITE_OTHER): Payer: 59 | Admitting: Physician Assistant

## 2024-02-11 ENCOUNTER — Encounter (HOSPITAL_COMMUNITY): Payer: Self-pay | Admitting: Physician Assistant

## 2024-02-11 DIAGNOSIS — F3130 Bipolar disorder, current episode depressed, mild or moderate severity, unspecified: Secondary | ICD-10-CM

## 2024-02-11 DIAGNOSIS — F411 Generalized anxiety disorder: Secondary | ICD-10-CM | POA: Diagnosis not present

## 2024-02-11 DIAGNOSIS — Z79899 Other long term (current) drug therapy: Secondary | ICD-10-CM

## 2024-02-11 MED ORDER — CLONAZEPAM 0.5 MG PO TABS: 0.5000 mg | ORAL_TABLET | ORAL | 0 refills | Status: DC | PRN

## 2024-02-11 MED ORDER — OXCARBAZEPINE 600 MG PO TABS: 600.0000 mg | ORAL_TABLET | Freq: Two times a day (BID) | ORAL | 3 refills | Status: DC
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
## 2024-02-12 ENCOUNTER — Other Ambulatory Visit: Payer: Self-pay | Admitting: Family Medicine

## 2024-02-12 DIAGNOSIS — M545 Low back pain, unspecified: Secondary | ICD-10-CM

## 2024-02-13 ENCOUNTER — Encounter: Payer: Self-pay | Admitting: Family Medicine

## 2024-02-19 ENCOUNTER — Encounter: Payer: Self-pay | Admitting: Family Medicine

## 2024-02-19 ENCOUNTER — Ambulatory Visit: Payer: Self-pay | Admitting: Family Medicine

## 2024-02-19 VITALS — BP 130/82 | HR 65 | Ht 64.0 in | Wt 186.4 lb

## 2024-02-19 DIAGNOSIS — M5412 Radiculopathy, cervical region: Secondary | ICD-10-CM

## 2024-02-19 NOTE — Patient Instructions (Signed)
 Good to see you today - Thank you for coming in  Things we discussed today:  1) Your neck pain is most likely due to irritation of the nerve as it exits your spinal canal. This could be due to an injury or strain, but it can also happen with age as your spine wears down. - Take tylenol once a day as needed for the back/neck pain. Avoid using this too often as it can worsen your headaches again - Apply heating pad to the area to help improve blood flow - Come back in about 1 month to follow-up this pain and discuss with Dr Barb Merino.

## 2024-02-19 NOTE — Progress Notes (Signed)
    SUBJECTIVE:   CHIEF COMPLAINT / HPI:   AB is a 53yo F w/ hx of hypertension, migraines, GERD - Going on for a month now.  - When she looks up, tilts head up, or reaches upwards, she feels a burning pain that starts in cervical neck, radiates down her bock and shoulders, and down her R arm.  - Reports R arm will feel swollen in the mornings, which gets better through the day. - Denies any preceding trauma   OBJECTIVE:   BP 130/82   Pulse 65   Ht 5\' 4"  (1.626 m)   Wt 186 lb 6.4 oz (84.6 kg)   LMP  (LMP Unknown) Comment: on Megace  SpO2 96%   BMI 32.00 kg/m   General: Alert, pleasant appearing woman. NAD. HEENT: NCAT. MMM. CV: RRR, no murmurs.  Resp: CTAB, no wheezing or crackles. Normal WOB on RA.  Ext: Moves all ext spontaneously Skin: Warm, well perfused  Msk: Pain with extension and flexion of neck. Tender palpation of left thoracic paraspinal region. Normal active ROM of BL shoulders. Sensation intact in BL UE.  Regular gait  ASSESSMENT/PLAN:   Assessment & Plan Cervical radicular pain Differential includes muscle spasm, cervical nerve impingement, spinal stenosis. Reassuringly no red flag symptoms such as weakness. Looks like pt has had similar pain in past, MRI from 12/2021 showed C5-6 spinal canal stenosis. Given this is an acute on chronic exacerbation, will try conservative management such as as needed Tylenol. -Follow-up in 1 month.  If pain is not improving, could consider orthopedic referral    Lincoln Brigham, MD [facility]

## 2024-02-24 ENCOUNTER — Ambulatory Visit (INDEPENDENT_AMBULATORY_CARE_PROVIDER_SITE_OTHER): Payer: Self-pay | Admitting: Family Medicine

## 2024-02-24 ENCOUNTER — Encounter: Payer: Self-pay | Admitting: Family Medicine

## 2024-02-24 VITALS — BP 112/72 | HR 83 | Ht 64.0 in | Wt 190.1 lb

## 2024-02-24 DIAGNOSIS — Z1211 Encounter for screening for malignant neoplasm of colon: Secondary | ICD-10-CM

## 2024-02-24 DIAGNOSIS — M549 Dorsalgia, unspecified: Secondary | ICD-10-CM

## 2024-02-24 DIAGNOSIS — M5412 Radiculopathy, cervical region: Secondary | ICD-10-CM

## 2024-02-24 MED ORDER — PREDNISONE 50 MG PO TABS: 50.0000 mg | ORAL_TABLET | Freq: Every day | ORAL | 0 refills | Status: DC

## 2024-02-24 NOTE — Patient Instructions (Addendum)
 Please take prednisone daily for 5 days  You should receive a call to schedule an appointment with orthopedics  You should get a call about colonoscopy

## 2024-02-24 NOTE — Progress Notes (Signed)
    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  Otherwise nontender throughout cervical and thoracic spine Nontender throughout bilateral shoulders and arms Full range of motion bilateral arm/shoulders Neck range of motion slightly limited due to pain Negative Hawkins, Neer's, empty can bilaterally  ASSESSMENT/PLAN:   Assessment & Plan Cervical radicular pain MRI cervical spine in 2023 showed degenerative changes at C5-C6 with mild spinal canal stenosis, severe right and moderate left neuroforaminal narrowing.  Suspect this is the source of her symptoms Prednisone burst 50mg  x5 days DG thoracic spine given point tenderness on exam today Referral to orthopedics - eval for additional imaging/interventions  Colonoscopy referral ordered  Vonna Drafts, MD [facility]

## 2024-02-25 ENCOUNTER — Encounter: Payer: Self-pay | Admitting: Family Medicine

## 2024-02-25 ENCOUNTER — Ambulatory Visit (HOSPITAL_COMMUNITY)
Admission: RE | Admit: 2024-02-25 | Discharge: 2024-02-25 | Disposition: A | Discharge status: Home / Self Care | Payer: Self-pay | Source: Ambulatory Visit | Attending: Family Medicine | Admitting: Family Medicine

## 2024-02-25 DIAGNOSIS — M5412 Radiculopathy, cervical region: Secondary | ICD-10-CM | POA: Insufficient documentation

## 2024-02-25 DIAGNOSIS — M549 Dorsalgia, unspecified: Secondary | ICD-10-CM | POA: Insufficient documentation

## 2024-03-09 NOTE — Progress Notes (Unsigned)
 NEUROLOGY FOLLOW UP OFFICE NOTE  Mariah Rice [id number]  Assessment/Plan:   Migraine without aura, without status migrainosus, not intractable.   Migraine prevention:  will try to get her on the [employer] and restart Emgality   Migraine rescue:  rizatriptan 10mg ; Zofran for nausea. Limit use of pain relievers to no more than 2 days out of week to prevent risk of rebound or medication-overuse headache. Keep headache diary Follow up 6 months.       Subjective:  Mariah Rice is a 53 year old female with COPD, HTN and hyperthyroidism who follows up for migraines.  UPDATE: Lost her insurance and hasn't been on [place] since November.  Migraines have picked up.   Intensity:  mild Duration:  5 minutes with rizatriptan Frequency:  every other day. Current NSAIDS/analgesics:  none Current triptans:  rizatriptan 10mg  Current ergotamine:  none Current anti-emetic:  ondansetron 4mg  Current muscle relaxants:  methocarbamol 750mg  Current Antihypertensive medications:  clonidine, amlodipine Current Antidepressant medications:  none Current Anticonvulsant medications:  oxycarbazepine 600mg  BID Current anti-CGRP:  Emgality Current Vitamins/Herbal/Supplements:  none Current Antihistamines/Decongestants:  hydroxyzine, Flonase, Zyrtec Other therapy:  none Hormone/birth control:  estradiol Other medications:  albuterol     Caffeine:  2 cups coffee daily, [place] iced tea  Alcohol:  occasional  Smoker:  1 ppd Diet:  No soda.  Water Exercise:  tries to walk but just had foot surgery Depression:  yes; Anxiety:  yes.  Bipolar disorder Other pain:  neck pain, shoulder pain bilaterally Sleep hygiene:  yes.  Takes amitriptyline  HISTORY:  Onset:  her 30s Location:  starts in back of eyes and spreads to top of head Quality:  pounding Intensity:  severe.   Aura:  absent Prodrome:  absent Associated symptoms:  Photophobia, phonophobia, nausea.  She denies associated  unilateral numbness or weakness. Duration:  1 day Frequency:  every 2 days Frequency of abortive medication: Tylenol 4 days a week Triggers:  unknown Relieving factors:  cold rag on forehead and rest in dark room Activity:  cannot function about once a week   MRI of brain with and without contrast on 10/31/2020 personally reviewed was unremarkable.   Also history of neck and right sided cervical radicular pain.  MRI of cervical spine on 12/26/2021 personally reviewed revealed degenerative changes at C5-6 with mild spinal stenosis and severe right/moderate left neural foraminal stenosis as well as mild degenerative changes at C4-5 without  significant spinal canal or neural foraminal stenosis.     Past NSAIDS/analgesics:  Fioricet, ibuprofen, naproxen, acetaminophen, diclofenac, tramadol Past abortive triptans:  sumatriptan 100mg  Past abortive ergotamine:  none Past muscle relaxants:  tizanidine, cyclobenzaprine Past anti-emetic:  promethazine Past antihypertensive medications:  propranolol ER 60mg  daily (80mg  exacerbated cough and concern with COPD), metoprolol, HCTZ Past antidepressant medications:  amitriptyline, duloxetine, sertraline, bupropion, citalopram Past anticonvulsant medications:  topiramate, Depakote, lamotrigine, gabapentin Past anti-CGRP:  Aimovig 140mg  (effective - insurance problem) Past vitamins/Herbal/Supplements:  none Past antihistamines/decongestants:  Benadryl Other past therapies:  none    Family history of headache:  mom  PAST MEDICAL HISTORY: Past Medical History:  Diagnosis Date   Abnormal finding on GI tract imaging    Anaphylaxis 07/26/2020   Anxiety    COPD (chronic obstructive pulmonary disease) (HCC)    COPD exacerbation (HCC) 07/17/2020   Dental abscess    Depression    Family history of breast cancer    Family history of breast cancer 01/26/2019   Family history of  ovarian cancer    Genetic testing 03/08/2019   Negative genetic testing on the  CancerNext-Expanded+RNAinsight testing.  The CancerNext-Expanded gene panel offered by [employer] and includes sequencing and rearrangement analysis for the following 67 genes: AIP, ALK, APC*, ATM*, BAP1, BARD1, BLM, BMPR1A, BRCA1*, BRCA2*, BRIP1*, CDH1*, CDK4, CDKN1B, CDKN2A, CHEK2*, DICER1, FANCC, FH, FLCN, GALNT12, HOXB13, MAX, MEN1, MET, MLH1*, MRE11A, MSH2*   GERD (gastroesophageal reflux disease)    Headache    migraines   Hypertension    Hyperthyroidism 1990s   PONV (postoperative nausea and vomiting)    Shortness of breath dyspnea    with anxiety   Small bowel obstruction (HCC)    Tick bite of abdomen 05/13/2017    MEDICATIONS: Current Outpatient Medications on File Prior to Visit  Medication Sig Dispense Refill   albuterol (VENTOLIN HFA) 108 (90 Base) MCG/ACT inhaler Inhale 2 puffs into the lungs every 6 (six) hours as needed for wheezing or shortness of breath. 8 g 1   amLODipine (NORVASC) 5 MG tablet TAKE 1 TABLET DAILY. INCREASE TO 2 TABS DAILY IF BLOOD PRESSURE > 140/90 AFTER 1 WEEK. 60 tablet 5   cetirizine (ZYRTEC) 10 MG tablet TAKE 1 TABLET BY MOUTH EVERY DAY (Patient not taking: Reported on 09/03/2023) 90 tablet 1   clonazePAM (KLONOPIN) 0.5 MG tablet Take 1 tablet (0.5 mg total) by mouth as needed for anxiety. 30 tablet 0   cloNIDine (CATAPRES) 0.1 MG tablet TAKE 1 TABLET BY MOUTH TWICE A DAY 60 tablet 5   EPINEPHrine 0.3 mg/0.3 mL IJ SOAJ injection Inject 0.3 mg into the muscle as needed for anaphylaxis (for difficulty breathing, throat, tongue or lip swelling. must come to ED after use.). 1 each 0   estradiol (ESTRACE) 2 MG tablet TAKE 1 TABLET BY MOUTH EVERY DAY 90 tablet 1   fluticasone (FLONASE) 50 MCG/ACT nasal spray USE 1 SPRAY IN EACH NOSTRIL ONCE DAILY FOR 14 DAYS (Patient taking differently: Place 1 spray into both nostrils at bedtime.) 48 mL 3   Galcanezumab-gnlm (EMGALITY) 120 MG/ML SOAJ Inject 120 mg into the skin every 28 (twenty-eight) days. 1.12 mL 11    methocarbamol (ROBAXIN) 750 MG tablet TAKE 1 TABLET TWO TIMES DAILY AS NEEDED 60 tablet 0   metoprolol tartrate (LOPRESSOR) 100 MG tablet Take 1 tablet (100 mg total) by mouth once for 1 dose. Take 90-120 minutes prior to scan. 1 tablet 0   omeprazole (PRILOSEC) 20 MG capsule TAKE 1 CAPSULE BY MOUTH EVERY DAY 90 capsule 1   ondansetron (ZOFRAN) 4 MG tablet Take 1 tablet (4 mg total) by mouth every 8 (eight) hours as needed for nausea or vomiting. 20 tablet 5   oxcarbazepine (TRILEPTAL) 600 MG tablet Take 1 tablet (600 mg total) by mouth 2 (two) times daily. 60 tablet 3   predniSONE (DELTASONE) 50 MG tablet Take 1 tablet (50 mg total) by mouth daily with breakfast. For 5 days 5 tablet 0   rizatriptan (MAXALT-MLT) 10 MG disintegrating tablet Take 1 tablet (10 mg total) by mouth as needed for migraine (May repeat in 2 hours.  Maximum 2 tablets in 24 hours.). May repeat in 2 hours if needed 10 tablet 5   rosuvastatin (CRESTOR) 10 MG tablet Take 1 tablet (10 mg total) by mouth daily. 90 tablet 2   Semaglutide-Weight Management (WEGOVY) 0.25 MG/0.5ML SOAJ Inject 0.25 mg into the skin once a week. 2 mL 0   SPIRIVA RESPIMAT 2.5 MCG/ACT AERS INHALE 2 PUFFS BY MOUTH INTO THE LUNGS  DAILY 30 each 3   Zoster Vaccine Adjuvanted [facility]) injection Administer Shingrix vaccination now and repeat in two months 1 each 1   No current facility-administered medications on file prior to visit.      ALLERGIES: Allergies  Allergen Reactions   Amoxicillin Anaphylaxis   Azithromycin Anaphylaxis   Meloxicam Nausea And Vomiting   Sumatriptan Nausea Only, Rash and Other (See Comments)    Throat felt like it was closing up   Penicillins Nausea Only and Rash    Has patient had a PCN reaction causing immediate rash, facial/tongue/throat swelling, SOB or lightheadedness with hypotension: no Has patient had a PCN reaction causing severe rash involving mucus membranes or skin necrosis:unknown Has patient had a PCN  reaction that required hospitalization : yes Has patient had a PCN reaction occurring within the last 10 years: no If all of the above answers are "NO", then may proceed with Cephalosporin use.    Sulfa Antibiotics Nausea Only and Rash    FAMILY HISTORY: Family History  Problem Relation Age of Onset   Ovarian cancer Mother 55       d. 30   Heart disease Father    Hypertension Father    COPD Father    Hypertension Paternal Aunt 59   Lung cancer Paternal Aunt 44   Breast cancer Paternal Aunt 53   Thyroid disease Maternal Grandmother    Lymphoma Maternal Grandmother 31       NHL, recurrance at 43   Lung cancer Maternal Grandmother 61   Hypertension Paternal Grandmother    Heart disease Paternal Grandmother    Heart Problems Paternal Grandmother    Dementia Paternal Grandmother    Colon cancer Neg Hx    Esophageal cancer Neg Hx    Stomach cancer Neg Hx    Pancreatic cancer Neg Hx       Objective:  Blood pressure 134/84, pulse 74, height 5\' 3"  (1.6 m), weight 197 lb 12.8 oz (89.7 kg), SpO2 96%. General: No acute distress.  Patient appears well-groomed.   Head:  Normocephalic/atraumatic Neck:  Supple.  No paraspinal tenderness.  Full range of motion. Heart:  Regular rate and rhythm. Neuro:  Alert and oriented.  Speech fluent and not dysarthric.  Language intact.  CN II-XII intact.  Bulk and tone normal.  Muscle strength 5/5 throughout.  Deep tendon reflexes 2+ throughout.  Gait normal.  Romberg negative.     Shon Millet, DO  CC: Vonna Drafts, MD

## 2024-03-11 ENCOUNTER — Encounter: Payer: Self-pay | Admitting: Neurology

## 2024-03-11 ENCOUNTER — Ambulatory Visit (INDEPENDENT_AMBULATORY_CARE_PROVIDER_SITE_OTHER): Payer: Self-pay | Admitting: Neurology

## 2024-03-11 VITALS — BP 134/84 | HR 74 | Ht 63.0 in | Wt 197.8 lb

## 2024-03-11 DIAGNOSIS — G43001 Migraine without aura, not intractable, with status migrainosus: Secondary | ICD-10-CM

## 2024-03-11 MED ORDER — ONDANSETRON HCL 4 MG PO TABS: 4.0000 mg | ORAL_TABLET | Freq: Three times a day (TID) | ORAL | 5 refills | Status: DC | PRN

## 2024-03-11 MED ORDER — EMGALITY 120 MG/ML ~~LOC~~ SOAJ: 120.0000 mg | SUBCUTANEOUS | 11 refills | Status: DC

## 2024-03-11 MED ORDER — RIZATRIPTAN BENZOATE 10 MG PO TABS: 10.0000 mg | ORAL_TABLET | ORAL | 5 refills | Status: DC | PRN

## 2024-03-11 NOTE — Addendum Note (Signed)
 Addended by: Leida Lauth on: 03/11/2024 09:39 AM   Modules accepted: Orders

## 2024-03-11 NOTE — Patient Instructions (Signed)
 Plan to restart Emality Rizatriptan and ondansetron refilled Limit use of pain relievers to no more than 2 days out of week to prevent risk of rebound or medication-overuse headache. Keep headache diary

## 2024-03-15 ENCOUNTER — Encounter: Payer: Self-pay | Admitting: Family Medicine

## 2024-03-18 ENCOUNTER — Telehealth: Payer: Self-pay

## 2024-03-18 NOTE — Telephone Encounter (Signed)
 Per letter received patient meets program eligibility requirement for [facility] care for 12 months.

## 2024-03-21 ENCOUNTER — Other Ambulatory Visit: Payer: Self-pay | Admitting: Family Medicine

## 2024-03-21 DIAGNOSIS — G8929 Other chronic pain: Secondary | ICD-10-CM

## 2024-03-22 ENCOUNTER — Telehealth: Payer: Self-pay | Admitting: Neurology

## 2024-03-22 ENCOUNTER — Ambulatory Visit: Payer: Self-pay | Admitting: Orthopedic Surgery

## 2024-03-22 NOTE — Telephone Encounter (Signed)
 Pt called in and left message with access nurse. The pt received something in the mail and wants to speak with Dr. Moises Blood assistant about it

## 2024-03-23 NOTE — Telephone Encounter (Signed)
 Per patient received Emgality for the three months worth.

## 2024-03-24 ENCOUNTER — Encounter: Payer: Self-pay | Admitting: Family Medicine

## 2024-03-25 ENCOUNTER — Ambulatory Visit: Payer: Self-pay | Admitting: Orthopedic Surgery

## 2024-03-27 ENCOUNTER — Other Ambulatory Visit: Payer: Self-pay | Admitting: Student

## 2024-03-27 ENCOUNTER — Encounter: Payer: Self-pay | Admitting: Family Medicine

## 2024-03-27 DIAGNOSIS — L299 Pruritus, unspecified: Secondary | ICD-10-CM

## 2024-03-29 ENCOUNTER — Other Ambulatory Visit: Payer: Self-pay | Admitting: Family Medicine

## 2024-03-29 DIAGNOSIS — J309 Allergic rhinitis, unspecified: Secondary | ICD-10-CM

## 2024-03-29 MED ORDER — FLUTICASONE PROPIONATE 50 MCG/ACT NA SUSP: 1.0000 | Freq: Every day | NASAL | 3 refills | Status: AC

## 2024-04-19 ENCOUNTER — Ambulatory Visit
Admission: RE | Admit: 2024-04-19 | Discharge: 2024-04-19 | Disposition: A | Discharge status: Home / Self Care | Source: Ambulatory Visit | Attending: Nurse Practitioner | Admitting: Nurse Practitioner

## 2024-04-19 ENCOUNTER — Ambulatory Visit (HOSPITAL_COMMUNITY)
Admission: RE | Admit: 2024-04-19 | Discharge: 2024-04-19 | Disposition: A | Discharge status: Home / Self Care | Source: Ambulatory Visit | Attending: Nurse Practitioner | Admitting: Nurse Practitioner

## 2024-04-19 VITALS — BP 123/79 | HR 54 | Temp 98.5°F | Resp 16

## 2024-04-19 DIAGNOSIS — M79644 Pain in right finger(s): Secondary | ICD-10-CM

## 2024-04-19 DIAGNOSIS — S6991XA Unspecified injury of right wrist, hand and finger(s), initial encounter: Secondary | ICD-10-CM | POA: Diagnosis not present

## 2024-04-19 DIAGNOSIS — X58XXXA Exposure to other specified factors, initial encounter: Secondary | ICD-10-CM | POA: Insufficient documentation

## 2024-04-19 DIAGNOSIS — M8588 Other specified disorders of bone density and structure, other site: Secondary | ICD-10-CM | POA: Insufficient documentation

## 2024-04-19 DIAGNOSIS — M85841 Other specified disorders of bone density and structure, right hand: Secondary | ICD-10-CM | POA: Diagnosis not present

## 2024-04-19 NOTE — ED Provider Notes (Signed)
 [facility]    CSN: [id number] Arrival date & time: 04/19/24  1059      History   Chief Complaint Chief Complaint  Patient presents with   Finger Injury    I think I broke my finger - Entered by patient    HPI Mariah Rice is a 53 y.o. female.   Patient presents today for worsening right index finger pain.  Reports 1 month ago at work, she was twisting a coffee pot lid and she felt a crack in her finger.  She reports initially, the area was a little bit tender to touch, swollen, and painful but it slowly improved over a week or 2.  Reports pain began again yesterday without any known injury.  She endorses swelling and pain with range of motion of the finger.  No numbness or tingling of the digit.  No fevers or nausea/vomiting since the pain began.  Has not taken anything for the pain so far.    Past Medical History:  Diagnosis Date   Abnormal finding on GI tract imaging    Anaphylaxis 07/26/2020   Anxiety    COPD (chronic obstructive pulmonary disease) (HCC)    COPD exacerbation (HCC) 07/17/2020   Dental abscess    Depression    Family history of breast cancer    Family history of breast cancer 01/26/2019   Family history of ovarian cancer    Genetic testing 03/08/2019   Negative genetic testing on the CancerNext-Expanded+RNAinsight testing.  The CancerNext-Expanded gene panel offered by [facility] and includes sequencing and rearrangement analysis for the following 67 genes: AIP, ALK, APC*, ATM*, BAP1, BARD1, BLM, BMPR1A, BRCA1*, BRCA2*, BRIP1*, CDH1*, CDK4, CDKN1B, CDKN2A, CHEK2*, DICER1, FANCC, FH, FLCN, GALNT12, HOXB13, MAX, MEN1, MET, MLH1*, MRE11A, MSH2*   GERD (gastroesophageal reflux disease)    Headache    migraines   Hypertension    Hyperthyroidism 1990s   PONV (postoperative nausea and vomiting)    Shortness of breath dyspnea    with anxiety   Small bowel obstruction (HCC)    Tick bite of abdomen 05/13/2017    Patient Active Problem List    Diagnosis Date Noted   Actinic keratosis 09/03/2023   Liver hemangioma 07/15/2023   Chest pain 05/23/2023   Class 2 severe obesity with serious comorbidity and body mass index (BMI) of 36.0 to 36.9 in adult (HCC) 02/06/2023   Sleep disturbance 10/16/2022   PTSD (post-traumatic stress disorder) 05/29/2022   Bipolar I disorder, most recent episode depressed (HCC) 05/29/2022   Generalized abdominal pain    Small bowel intussusception (HCC)    Pre-operative clearance 10/11/2021   Closed nondisplaced fracture of right calcaneus with routine healing 01/05/2021   Plantar fasciitis 09/12/2020   Sinus disorder 08/02/2020   Bilateral leg edema 08/02/2020   Anxiety and depression    Abnormal finding on GI tract imaging    Hyponatremia 05/03/2019   COPD suggested by initial evaluation (HCC) 02/15/2019   Chronic bilateral low back pain without sciatica 06/06/2018   Prediabetes 05/31/2018   Migraine without aura and with status migrainosus, not intractable 11/30/2017   Itching 05/13/2017   Neck pain 03/26/2017   Mood disorder (HCC) 10/06/2016   Right ear pain 07/23/2016   GERD (gastroesophageal reflux disease) 10/19/2015   H/O bilateral oophorectomy 08/13/2015   Midline back pain 03/15/2015   GAD (generalized anxiety disorder) 01/06/2013   Tobacco abuse 11/10/2012   Obesity (BMI 35.0-39.9 without comorbidity) 11/10/2012   Hypertension 11/10/2012    Past  Surgical History:  Procedure Laterality Date   BALLOON ENTEROSCOPY  02/07/2022   Procedure: BALLOON ENTEROSCOPY;  Surgeon: Annis Kinder, DO;  Location: [facility];  Service: Gastroenterology;;   BIOPSY  02/07/2022   Procedure: BIOPSY;  Surgeon: Annis Kinder, DO;  Location: [facility];  Service: Gastroenterology;;   DILATION AND CURETTAGE OF UTERUS  1999   ENTEROSCOPY N/A 05/12/2019   Procedure: ENTEROSCOPY;  Surgeon: Albertina Hugger, MD;  Location: [facility];  Service: Gastroenterology;  Laterality: N/A;    ENTEROSCOPY N/A 02/07/2022   Procedure: ENTEROSCOPY;  Surgeon: Annis Kinder, DO;  Location: [facility];  Service: Gastroenterology;  Laterality: N/A;  single balloon   FOOT SURGERY Right    INCISE AND DRAIN ABCESS  2005   LAPAROSCOPIC ASSISTED VAGINAL HYSTERECTOMY N/A 07/11/2015   Procedure: LAPAROSCOPIC ASSISTED VAGINAL Total HYSTERECTOMY;  Surgeon: Julianne Octave, MD;  Location: [facility];  Service: Gynecology;  Laterality: N/A;   LAPAROSCOPIC BILATERAL SALPINGECTOMY Bilateral 07/11/2015   Procedure: LAPAROSCOPIC BILATERAL SALPINGO OOPHORECTOMY ;  Surgeon: Julianne Octave, MD;  Location: [facility];  Service: Gynecology;  Laterality: Bilateral;   tubal ligation  2000   WISDOM TOOTH EXTRACTION     XI ROBOT ASSISTED DIAGNOSTIC LAPAROSCOPY N/A 10/16/2021   Procedure: XI ROBOT ASSISTED DIAGNOSTIC LAPAROSCOPY;  Surgeon: Emmalene Hare, MD;  Location: [facility];  Service: General;  Laterality: N/A;    OB History     Gravida  4   Para  2   Term  2   Preterm      AB  2   Living  2      SAB  2   IAB      Ectopic      Multiple      Live Births               Home Medications    Prior to Admission medications   Medication Sig Start Date End Date Taking? Authorizing Provider  amLODipine  (NORVASC ) 5 MG tablet TAKE 1 TABLET DAILY. INCREASE TO 2 TABS DAILY IF BLOOD PRESSURE > 140/90 AFTER 1 WEEK. 12/05/23  Yes Mahmood, Julietta Ogren, MD  cetirizine  (ZYRTEC ) 10 MG tablet TAKE 1 TABLET BY MOUTH EVERY DAY 08/05/22  Yes Genora Kidd, MD  clonazePAM  (KLONOPIN ) 0.5 MG tablet Take 1 tablet (0.5 mg total) by mouth as needed for anxiety. 02/13/24  Yes Nwoko, Uchenna E, PA  cloNIDine  (CATAPRES ) 0.1 MG tablet TAKE 1 TABLET BY MOUTH TWICE A DAY 12/05/23  Yes Edison Gore, MD  estradiol  (ESTRACE ) 2 MG tablet TAKE 1 TABLET BY MOUTH EVERY DAY 02/02/24  Yes Edison Gore, MD  fluticasone  (FLONASE ) 50 MCG/ACT nasal spray Place 1 spray into both nostrils daily. 03/29/24  Yes Edison Gore, MD   Galcanezumab -gnlm (EMGALITY ) 120 MG/ML SOAJ Inject 120 mg into the skin every 28 (twenty-eight) days. 03/11/24  Yes Jaffe, Adam R, DO  methocarbamol  (ROBAXIN ) 750 MG tablet TAKE 1 TABLET TWO TIMES DAILY AS NEEDED 03/22/24  Yes Albin Huh, MD  omeprazole  (PRILOSEC) 20 MG capsule TAKE 1 CAPSULE BY MOUTH EVERY DAY 12/01/23  Yes Edison Gore, MD  oxcarbazepine  (TRILEPTAL ) 600 MG tablet Take 1 tablet (600 mg total) by mouth 2 (two) times daily. 02/11/24  Yes Nwoko, Uchenna E, PA  rizatriptan  (MAXALT ) 10 MG tablet Take 1 tablet (10 mg total) by mouth as needed for migraine. May repeat in 2 hours if needed.  Maximum 2 tablets in 24 hours. 03/11/24  Yes Merriam Abbey, DO  rosuvastatin  (CRESTOR ) 10 MG tablet Take 1 tablet (10 mg total) by mouth daily. 08/26/23  Yes Weaver, Scott T, PA-C  SPIRIVA  RESPIMAT 2.5 MCG/ACT AERS INHALE 2 PUFFS BY MOUTH INTO THE LUNGS DAILY 11/17/23  Yes Genora Kidd, MD  albuterol  (VENTOLIN  HFA) 108 (90 Base) MCG/ACT inhaler Inhale 2 puffs into the lungs every 6 (six) hours as needed for wheezing or shortness of breath. 11/18/23   Edison Gore, MD  EPINEPHrine  0.3 mg/0.3 mL IJ SOAJ injection Inject 0.3 mg into the muscle as needed for anaphylaxis (for difficulty breathing, throat, tongue or lip swelling. must come to ED after use.). Patient not taking: Reported on 03/11/2024 09/21/21   Mordechai Aashika, DO  metoprolol  tartrate (LOPRESSOR ) 100 MG tablet Take 1 tablet (100 mg total) by mouth once for 1 dose. Take 90-120 minutes prior to scan. 05/29/23 03/11/24  Pemberton, Heather  E, MD  ondansetron  (ZOFRAN ) 4 MG tablet Take 1 tablet (4 mg total) by mouth every 8 (eight) hours as needed for nausea or vomiting. 03/11/24   Merriam Abbey, DO  Semaglutide -Weight Management (WEGOVY ) 0.25 MG/0.5ML SOAJ Inject 0.25 mg into the skin once a week. Patient not taking: Reported on 03/11/2024 12/08/23   Edison Gore, MD  Zoster Vaccine Adjuvanted (SHINGRIX ) injection Administer Shingrix  vaccination now and  repeat in two months Patient not taking: Reported on 03/11/2024 09/23/23   Edison Gore, MD    Family History Family History  Problem Relation Age of Onset   Ovarian cancer Mother 100       d. 28   Heart disease Father    Hypertension Father    COPD Father    Hypertension Paternal Aunt 75   Lung cancer Paternal Aunt 80   Breast cancer Paternal Aunt 54   Thyroid  disease Maternal Grandmother    Lymphoma Maternal Grandmother 47       NHL, recurrance at 11   Lung cancer Maternal Grandmother 61   Hypertension Paternal Grandmother    Heart disease Paternal Grandmother    Heart Problems Paternal Grandmother    Dementia Paternal Grandmother    Colon cancer Neg Hx    Esophageal cancer Neg Hx    Stomach cancer Neg Hx    Pancreatic cancer Neg Hx     Social History Social History   Tobacco Use   Smoking status: Every Day    Current packs/day: 1.00    Average packs/day: 1 pack/day for 37.3 years (37.3 ttl pk-yrs)    Types: Cigarettes    Start date: 12/16/1986    Passive exposure: Past   Smokeless tobacco: Never   Tobacco comments:    Smoked 1 ppd for 25 years  Vaping Use   Vaping status: Never Used  Substance Use Topics   Alcohol use: Yes    Comment: Occas   Drug use: No     Allergies   Amoxicillin , Azithromycin , Meloxicam , Sumatriptan , Penicillins, and Sulfa antibiotics   Review of Systems Review of Systems Per HPI  Physical Exam Triage Vital Signs ED Triage Vitals  Encounter Vitals Group     BP 04/19/24 1137 123/79     Systolic BP Percentile --      Diastolic BP Percentile --      Pulse Rate 04/19/24 1137 (!) 54     Resp 04/19/24 1137 16     Temp 04/19/24 1137 98.5 F (36.9 C)     Temp Source 04/19/24 1137 Oral     SpO2 04/19/24 1137 97 %     Weight --  Height --      Head Circumference --      Peak Flow --      Pain Score 04/19/24 1138 9     Pain Loc --      Pain Education --      Exclude from Growth Chart --    No data found.  Updated Vital  Signs BP 123/79 (BP Location: Right Arm)   Pulse (!) 54   Temp 98.5 F (36.9 C) (Oral)   Resp 16   LMP  (LMP Unknown) Comment: on Megace   SpO2 97%   Visual Acuity Right Eye Distance:   Left Eye Distance:   Bilateral Distance:    Right Eye Near:   Left Eye Near:    Bilateral Near:     Physical Exam Vitals and nursing note reviewed.  Constitutional:      General: She is not in acute distress.    Appearance: Normal appearance. She is not toxic-appearing.  HENT:     Mouth/Throat:     Mouth: Mucous membranes are moist.     Pharynx: Oropharynx is clear.  Pulmonary:     Effort: Pulmonary effort is normal. No respiratory distress.  Skin:    General: Skin is warm and dry.     Capillary Refill: Capillary refill takes less than 2 seconds.     Coloration: Skin is not jaundiced or pale.     Findings: No erythema.     Comments: Inspection: no swelling, bruising, obvious deformity or redness to right index finger Palpation: tender to palpation right index finger over proximal phalax; no obvious deformities palpated ROM: Full ROM to right second digit Strength: difficult to assess secondary to pain Neurovascular: neurovascularly intact in distal right second digit   Neurological:     Mental Status: She is alert and oriented to person, place, and time.  Psychiatric:        Behavior: Behavior is cooperative.      UC Treatments / Results  Labs (all labs ordered are listed, but only abnormal results are displayed) Labs Reviewed - No data to display  EKG   Radiology No results found.  Procedures Procedures (including critical care time)  Medications Ordered in UC Medications - No data to display  Initial Impression / Assessment and Plan / UC Course  I have reviewed the triage vital signs and the nursing notes.  Pertinent labs & imaging results that were available during my care of the patient were reviewed by me and considered in my medical decision making (see chart for  details).   Patient is well-appearing, normotensive, afebrile, not tachycardic, not tachypneic, oxygenating well on room air.    1. Pain in finger of right hand No red flags Will obtain outpatient imaging due to lack of ability of x-ray tech in urgent care today In meantime, treat with finger splint, rest, ice, Tylenol /ibuprofen  for pain Follow-up with Ortho if symptoms do not improve with treatment and contact information provided  Update: X-ray imaging is negative for acute bony abnormality today.  No change to treatment plan, suspect finger sprain.  Continue immobilization with finger splint, Tylenol /ibuprofen , rest, elevation, and follow-up with Ortho if symptoms do not improve.  The patient was given the opportunity to ask questions.  All questions answered to their satisfaction.  The patient is in agreement to this plan.   Final Clinical Impressions(s) / UC Diagnoses   Final diagnoses:  Pain in finger of right hand     Discharge Instructions  We have put a splint on your finger today to help with pain, swelling, and help with healing.  After leaving urgent care today, please go to [facility] - Imaging/Radiology department to have an x-ray of your finger.  I will call you if the results are abnormal later today.  In the meantime, recommend resting your finger in the splint, applying ice to the area to help with pain and swelling 15 minutes on, 45 minutes off, and Tylenol  as needed for pain.  Seek care if symptoms worsen or do not improve with treatment with Ortho-contact information has been provided.    ED Prescriptions   None    PDMP not reviewed this encounter.   Wilhemena Harbour, NP 04/19/24 1415

## 2024-04-19 NOTE — Discharge Instructions (Addendum)
 We have put a splint on your finger today to help with pain, swelling, and help with healing.  After leaving urgent care today, please go to [facility] - Imaging/Radiology department to have an x-ray of your finger.  I will call you if the results are abnormal later today.  In the meantime, recommend resting your finger in the splint, applying ice to the area to help with pain and swelling 15 minutes on, 45 minutes off, and Tylenol  as needed for pain.  Seek care if symptoms worsen or do not improve with treatment with Ortho-contact information has been provided.

## 2024-04-19 NOTE — ED Triage Notes (Signed)
 Pt states she was trying to turn a coffee pot and hurt her right index finger a month ago. Then yesterday the pain got worse in her finger, now having swelling and pain with moving.

## 2024-04-23 ENCOUNTER — Other Ambulatory Visit: Payer: Self-pay | Admitting: Family Medicine

## 2024-04-23 DIAGNOSIS — G8929 Other chronic pain: Secondary | ICD-10-CM

## 2024-04-26 ENCOUNTER — Ambulatory Visit: Payer: Self-pay | Admitting: Orthopedic Surgery

## 2024-04-26 ENCOUNTER — Other Ambulatory Visit (INDEPENDENT_AMBULATORY_CARE_PROVIDER_SITE_OTHER)

## 2024-04-26 VITALS — BP 137/77 | HR 61 | Ht 63.0 in | Wt 198.0 lb

## 2024-04-26 DIAGNOSIS — M542 Cervicalgia: Secondary | ICD-10-CM

## 2024-04-26 DIAGNOSIS — M5412 Radiculopathy, cervical region: Secondary | ICD-10-CM

## 2024-04-26 DIAGNOSIS — Z720 Tobacco use: Secondary | ICD-10-CM | POA: Diagnosis not present

## 2024-04-26 MED ORDER — PREGABALIN 75 MG PO CAPS: 75.0000 mg | ORAL_CAPSULE | Freq: Two times a day (BID) | ORAL | 0 refills | Status: DC

## 2024-04-26 NOTE — Progress Notes (Signed)
 Orthopedic Spine Surgery Office Note  Assessment: Patient is a 53 y.o. female with neck pain that radiates into the bilateral shoulders and down the right upper extremity to the radial forearm   Plan: -Explained that initially conservative treatment is tried as a significant number of patients may experience relief with these treatment modalities. Discussed that the conservative treatments include:  -activity modification  -physical therapy  -over the counter pain medications  -medrol  dosepak  -cervical steroid injections -Patient has tried tylenol , voltaren  gel, prednisone  -Patient has been doing treatment with Dr. Baby Bolt including Tylenol  and prednisone  since 02/24/2024.  Since it has been more than 6 weeks without any relief of her symptoms, recommended an MRI to evaluate for radiculopathy -Prescribed lyrica for additional pain relief since she has not gotten any relief with gabapentin  when used previously -Would need to be nicotine  free prior to any elective spine surgery -Patient should return to office in 4 weeks, x-rays at next visit: none   Discussed the importance of smoking cessation. Explained that it will benefit her overall health but would affect her surgical outcomes as well. Told her that smoking has been shown to increase infection rates, wound healing issues, and pseudarthrosis. After this conversation, patient expressed understanding and was going to work on quitting. This conversation took .   ___________________________________________________________________________   History:  Patient is a 53 y.o. female who presents today for cervical spine.  Patient has had about 3 months of neck pain that radiates into the bilateral shoulders and into the right upper extremity.  She feels it going into the lateral aspect of the arm and into the radial forearm.  She then feels it goes into the fingers.  She feels paresthesias in the fingers.  She says she feels in all the  fingers.  She does not have any pain radiating further than the shoulder on the left side. Has pain even at rest and has woken from her sleep in pain. There is no trauma or injury that preceded the onset of pain.   Weakness: Yes, feels right arm is slowly getting weaker Difficulty with fine motor skills (e.g., buttoning shirts, handwriting): Denies Symptoms of imbalance: Denies Paresthesias and numbness: Yes, has paresthesias in her right hand.  No other numbness or paresthesias Bowel or bladder incontinence: Denies Saddle anesthesia: Denies  Treatments tried: Tylenol , Voltaren  gel, prednisone   Review of systems: Denies fevers and chills, night sweats, unexplained weight loss, history of cancer. Has had pain that wakes her at night  Past medical history: Depression/anxiety Hyperthyroidism HTN HLD Migraines GERD COPD  Allergies: amoxicillin , azithromycin , meloxicam , sumatriptan , penicillin, sulfa  Past surgical history:  D&C of uterus Hysterectomy and bilateral salpingectomy I&D abscess Stomach surgery  Social history: Reports use of nicotine  product (smoking, vaping, patches, smokeless) Alcohol use: rare Reports marijuana use, denies other recreational drug use   Physical Exam:  BMI of 35.1  General: no acute distress, appears stated age Neurologic: alert, answering questions appropriately, following commands Respiratory: unlabored breathing on room air, symmetric chest rise Psychiatric: appropriate affect, normal cadence to speech   MSK (spine):  -Strength exam      Left  Right Grip strength                5/5  5/5 Interosseus   5/5   5/5 Wrist extension  5/5  5/5 Wrist flexion   5/5  5/5 Elbow flexion   5/5  4+/5 Deltoid    5/5  5/5  -Sensory exam  Sensation intact to light touch in L3-S1 nerve distributions of bilateral lower extremities  Sensation intact to light touch in C5-T1 nerve distributions of bilateral upper extremities  -Brachioradialis  DTR: 2/4 on the left, 2/4 on the right -Biceps DTR: 2/4 on the left, 2/4 on the right  -Spurling: negative bilaterally  -Hoffman sign: negative bilaterally  -Clonus: no beats bilaterally  -Interosseous wasting: none seen -Grip and release test: negative -Romberg: negative -Gait: normal   Left shoulder exam: no pain through range of motion, negative jobe, negative belly press, no weakness with external rotation with arm at side Right shoulder exam: no pain through range of motion, negative jobe, negative belly press, no weakness with external rotation with arm at side  Tinel's at wrist: negative bilaterally Phalen's at wrist: negative bilaterally Durkan's: negative bilaterally  Tinel's at elbow: negative bilaterally  Imaging: XRs of the cervical spine from 04/27/2023 were independently reviewed and interpreted, showing disc height loss at C5/6.  No evidence of instability on flexion/extension views.  No fracture or dislocation seen.  XRs of the cervical spine from 10/31/2023 was independently reviewed and interpreted, showing disc height loss at C5/6.  No other significant degenerative changes.  No fracture or dislocation seen.  No spondylolisthesis seen.   Patient name: Mariah Rice Patient MRN: [id number] Date of visit: 04/26/24

## 2024-05-04 ENCOUNTER — Telehealth: Payer: Self-pay | Admitting: Neurology

## 2024-05-04 NOTE — Telephone Encounter (Signed)
 Pt. Is going on vacation on Thursday morning, the day she is supposed to take her Emgality  injection was wondering if she could travel with it in a cooler with ice or take injection a day early, please call back with advice

## 2024-05-05 ENCOUNTER — Encounter: Payer: Self-pay | Admitting: Neurology

## 2024-05-05 NOTE — Telephone Encounter (Signed)
 Mychart message sent, I would just go ahead and take it a day early

## 2024-05-11 ENCOUNTER — Encounter: Payer: Self-pay | Admitting: Orthopedic Surgery

## 2024-05-11 ENCOUNTER — Other Ambulatory Visit (HOSPITAL_COMMUNITY): Payer: Self-pay | Admitting: Physician Assistant

## 2024-05-11 ENCOUNTER — Telehealth: Payer: Self-pay

## 2024-05-11 DIAGNOSIS — Z79899 Other long term (current) drug therapy: Secondary | ICD-10-CM

## 2024-05-11 NOTE — Telephone Encounter (Signed)
 Patient calls nurse line requesting to make a lab only visit.   She reports she needs a urine drug screen for her [facility] provider. She reports he has already placed order in the system.   She reports she feels more comfortable coming here.  Advised we are not able to release any orders from other providers, per our lab.   Patient advised to go to [facility] for testing.   She was appreciative.

## 2024-05-12 ENCOUNTER — Telehealth (HOSPITAL_COMMUNITY): Payer: Self-pay | Admitting: Physician Assistant

## 2024-05-12 ENCOUNTER — Encounter (HOSPITAL_COMMUNITY): Payer: Self-pay | Admitting: Physician Assistant

## 2024-05-12 DIAGNOSIS — F3130 Bipolar disorder, current episode depressed, mild or moderate severity, unspecified: Secondary | ICD-10-CM

## 2024-05-12 DIAGNOSIS — F411 Generalized anxiety disorder: Secondary | ICD-10-CM

## 2024-05-12 DIAGNOSIS — Z79899 Other long term (current) drug therapy: Secondary | ICD-10-CM | POA: Diagnosis not present

## 2024-05-12 MED ORDER — OXCARBAZEPINE 600 MG PO TABS: 600.0000 mg | ORAL_TABLET | Freq: Two times a day (BID) | ORAL | 3 refills | Status: DC
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
## 2024-05-14 ENCOUNTER — Ambulatory Visit
Admission: RE | Admit: 2024-05-14 | Discharge: 2024-05-14 | Disposition: A | Discharge status: Home / Self Care | Source: Ambulatory Visit | Attending: Orthopedic Surgery | Admitting: Orthopedic Surgery

## 2024-05-14 DIAGNOSIS — M4802 Spinal stenosis, cervical region: Secondary | ICD-10-CM | POA: Diagnosis not present

## 2024-05-14 DIAGNOSIS — M2578 Osteophyte, vertebrae: Secondary | ICD-10-CM | POA: Diagnosis not present

## 2024-05-14 DIAGNOSIS — M5126 Other intervertebral disc displacement, lumbar region: Secondary | ICD-10-CM | POA: Diagnosis not present

## 2024-05-14 DIAGNOSIS — M5412 Radiculopathy, cervical region: Secondary | ICD-10-CM

## 2024-05-15 ENCOUNTER — Other Ambulatory Visit (HOSPITAL_COMMUNITY): Payer: Self-pay | Admitting: Physician Assistant

## 2024-05-15 DIAGNOSIS — F411 Generalized anxiety disorder: Secondary | ICD-10-CM

## 2024-05-17 ENCOUNTER — Telehealth (HOSPITAL_COMMUNITY): Payer: Self-pay

## 2024-05-17 ENCOUNTER — Other Ambulatory Visit: Payer: Self-pay | Admitting: Family Medicine

## 2024-05-17 NOTE — Telephone Encounter (Signed)
 Pt called asking for refill on klonopin  and trileptal , told patient that she may have to get UDS done before release of these meds. But trileptal  could potentially be refilled, just had to in box provider.

## 2024-05-20 ENCOUNTER — Telehealth (HOSPITAL_COMMUNITY): Payer: Self-pay

## 2024-05-20 NOTE — Telephone Encounter (Signed)
 Pt is asking for a refill on all PSYCH meds, states she did her UDS on Monday.

## 2024-05-20 NOTE — Telephone Encounter (Signed)
 Message acknowledged and reviewed. Unable to refill medication until patient's results have been sent. Urine drug screen results have not been made available yet.

## 2024-05-21 ENCOUNTER — Other Ambulatory Visit (HOSPITAL_COMMUNITY): Payer: Self-pay | Admitting: Physician Assistant

## 2024-05-21 DIAGNOSIS — F411 Generalized anxiety disorder: Secondary | ICD-10-CM

## 2024-05-21 LAB — CANNABINOID CONFIRMATION, UR
CANNABINOIDS: POSITIVE — AB
Carboxy THC GC/MS Conf: 574 ng/mL

## 2024-05-21 LAB — URINE DRUG PANEL 7
Amphetamines, Urine: NEGATIVE ng/mL
Barbiturate Quant, Ur: NEGATIVE ng/mL
Benzodiazepine Quant, Ur: NEGATIVE ng/mL
Cocaine (Metab.): NEGATIVE ng/mL
Creatinine, Urine: 54 mg/dL (ref 20.0–300.0)
Nitrite Urine, Quantitative: NEGATIVE ug/mL
OPIATE SCREEN URINE: NEGATIVE ng/mL
PCP Quant, Ur: NEGATIVE ng/mL
pH, Urine: 6.6 (ref 4.5–8.9)

## 2024-05-21 MED ORDER — CLONAZEPAM 0.5 MG PO TABS
0.5000 mg | ORAL_TABLET | ORAL | 0 refills | Status: DC | PRN
Start: 2024-05-21 — End: 2024-07-16

## 2024-05-21 NOTE — Progress Notes (Signed)
 Provider was able to receive patient's urine drug screen results. Patient's clonazepam  0.25 mg daily as needed to be e-prescribed to pharmacy of choice.

## 2024-05-26 ENCOUNTER — Ambulatory Visit (INDEPENDENT_AMBULATORY_CARE_PROVIDER_SITE_OTHER): Admitting: Orthopedic Surgery

## 2024-05-26 DIAGNOSIS — M5412 Radiculopathy, cervical region: Secondary | ICD-10-CM | POA: Diagnosis not present

## 2024-05-26 NOTE — Progress Notes (Signed)
 Orthopedic Spine Surgery Office Note   Assessment: Patient is a 53 y.o. female with neck pain that radiates into the bilateral shoulders and down the right upper extremity to the radial forearm.  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  Referral provided to her today -Told her that if she does not get satisfactory relief with the change in the Lyrica  or the ESI, then her remaining options would be surgery versus pain management -Would need to be nicotine  free prior to any elective spine surgery -Patient should return to office in 8 weeks, x-rays at next visit: none   ___________________________________________________________________________     History:   Patient is a 53 y.o. female who presents today for follow-up on her cervical spine.  Patient has now had about 4 months of neck pain that radiates into her bilateral shoulders and into the right upper extremity.  She has not noticed any significant changes since she was last seen in the office.  She has not developed any new symptoms.  In the right arm, she feels the going into the lateral aspect of the arm and into the radial forearm.  She feels it going into all the fingers as well.  She noticed very minimal relief with taking the Lyrica  just at night.  She has not tried taking it twice daily yet.  She rates the pain as an 8 out of 10 at its worst.   Treatments tried: Tylenol , Voltaren  gel, prednisone , gabapentin , lyrica     Physical Exam:   General: no acute distress, appears stated age Neurologic: alert, answering questions appropriately, following commands Respiratory: unlabored breathing on room air, symmetric chest rise Psychiatric: appropriate affect, normal  cadence to speech     MSK (spine):   -Strength exam                                                   Left                  Right Grip strength                5/5                  5/5 Interosseus                  5/5                  5/5 Wrist extension            5/5                  5/5 Wrist flexion                 5/5                  5/5 Elbow flexion                5/5                  4+/5 Deltoid  5/5                  5/5   -Sensory exam                          Sensation intact to light touch in C5-T1 nerve distributions of bilateral upper extremities    Imaging: XRs of the cervical spine from 04/27/2023 were previously independently reviewed and interpreted, showing disc height loss at C5/6.  No evidence of instability on flexion/extension views.  No fracture or dislocation seen.   MRI of the cervical spine from 05/14/2024 was independently reviewed and interpreted, showing bilateral foraminal stenosis at C5/6.  No other significant stenosis seen.  No T2 cord signal change seen.     Patient name: Mariah Rice Patient MRN: [id number] Date of visit: 05/26/24

## 2024-05-28 ENCOUNTER — Other Ambulatory Visit: Payer: Self-pay | Admitting: Family Medicine

## 2024-05-28 DIAGNOSIS — G8929 Other chronic pain: Secondary | ICD-10-CM

## 2024-06-04 ENCOUNTER — Other Ambulatory Visit: Payer: Self-pay | Admitting: Orthopedic Surgery

## 2024-06-14 ENCOUNTER — Telehealth: Payer: Self-pay

## 2024-06-14 ENCOUNTER — Other Ambulatory Visit: Payer: Self-pay | Admitting: Family Medicine

## 2024-06-14 DIAGNOSIS — I10 Essential (primary) hypertension: Secondary | ICD-10-CM

## 2024-06-14 DIAGNOSIS — F411 Generalized anxiety disorder: Secondary | ICD-10-CM

## 2024-06-14 DIAGNOSIS — K219 Gastro-esophageal reflux disease without esophagitis: Secondary | ICD-10-CM

## 2024-06-14 MED ORDER — DIAZEPAM 5 MG PO TABS: ORAL_TABLET | ORAL | 0 refills | Status: DC

## 2024-06-14 NOTE — Telephone Encounter (Signed)
 Pre-procedure diazepam ordered for pre-operative anxiety.

## 2024-06-14 NOTE — Telephone Encounter (Signed)
 Patient requesting sedation for [facility] July 21 call [facility] on [address] [place]. [place]

## 2024-06-14 NOTE — Addendum Note (Signed)
 Addended by: ELDONNA GARDINER POUR on: 06/14/2024 12:06 PM   Modules accepted: Orders

## 2024-06-24 ENCOUNTER — Other Ambulatory Visit: Payer: Self-pay | Admitting: Family Medicine

## 2024-06-24 DIAGNOSIS — M545 Low back pain, unspecified: Secondary | ICD-10-CM

## 2024-07-01 ENCOUNTER — Other Ambulatory Visit: Payer: Self-pay

## 2024-07-01 DIAGNOSIS — J449 Chronic obstructive pulmonary disease, unspecified: Secondary | ICD-10-CM

## 2024-07-01 MED ORDER — SPIRIVA RESPIMAT 2.5 MCG/ACT IN AERS: INHALATION_SPRAY | RESPIRATORY_TRACT | 3 refills | Status: DC

## 2024-07-02 ENCOUNTER — Encounter: Payer: Self-pay | Admitting: Family Medicine

## 2024-07-02 ENCOUNTER — Other Ambulatory Visit: Payer: Self-pay | Admitting: Orthopedic Surgery

## 2024-07-02 MED ORDER — TRIAMCINOLONE ACETONIDE 0.1 % EX OINT: 1.0000 | TOPICAL_OINTMENT | Freq: Two times a day (BID) | CUTANEOUS | 0 refills | Status: AC

## 2024-07-05 ENCOUNTER — Ambulatory Visit: Admitting: Physical Medicine and Rehabilitation

## 2024-07-05 ENCOUNTER — Encounter: Payer: Self-pay | Admitting: Orthopedic Surgery

## 2024-07-05 ENCOUNTER — Other Ambulatory Visit: Payer: Self-pay

## 2024-07-05 VITALS — BP 160/100 | HR 62

## 2024-07-05 DIAGNOSIS — M5412 Radiculopathy, cervical region: Secondary | ICD-10-CM

## 2024-07-05 MED ORDER — METHYLPREDNISOLONE ACETATE 40 MG/ML IJ SUSP
40.0000 mg | Freq: Once | INTRAMUSCULAR | Status: AC
Start: 2024-07-05 — End: 2024-07-05
  Administered 2024-07-05: 40 mg

## 2024-07-05 NOTE — Procedures (Signed)
 Cervical Epidural Steroid Injection - Interlaminar Approach with Fluoroscopic Guidance  Patient: Mariah Rice      Date of Birth: 05-14-1971 MRN: [id number] PCP: Romelle Booty, MD      Visit Date: 07/05/2024   Universal Protocol:    Date/Time: 07/21/259:15 AM  Consent Given By: the patient  Position: PRONE  Additional Comments: Vital signs were monitored before and after the procedure. Patient was prepped and draped in the usual sterile fashion. The correct patient, procedure, and site was verified.   Injection Procedure Details:   Procedure diagnoses: Radiculopathy, cervical region [M54.12]    Meds Administered:  Meds ordered this encounter  Medications   methylPREDNISolone  acetate (DEPO-MEDROL ) injection 40 mg     Laterality: Right  Location/Site: C7-T1  Needle: 3.5 in., 20 ga. Tuohy  Needle Placement: Paramedian epidural space  Findings:  -Comments: Excellent flow of contrast into the epidural space.  Procedure Details: Using a paramedian approach from the side mentioned above, the region overlying the inferior lamina was localized under fluoroscopic visualization and the soft tissues overlying this structure were infiltrated with 4 ml. of 1% Lidocaine  without Epinephrine . A # 20 gauge, Tuohy needle was inserted into the epidural space using a paramedian approach.  The epidural space was localized using loss of resistance along with contralateral oblique bi-planar fluoroscopic views.  After negative aspirate for air, blood, and CSF, a 2 ml. volume of Isovue-250 was injected into the epidural space and the flow of contrast was observed. Radiographs were obtained for documentation purposes.   The injectate was administered into the level noted above.  Additional Comments:  The patient tolerated the procedure well Dressing: 2 x 2 sterile gauze and Band-Aid    Post-procedure details: Patient was observed during the procedure. Post-procedure instructions were  reviewed.  Patient left the clinic in stable condition.

## 2024-07-05 NOTE — Progress Notes (Signed)
 Pain Scale   Average Pain 4 Patient advising she has chronic neck pain radiating bilaterally to her shoulders. Patient advising her pain comes and goes depending on what she is doing.        +Driver, -BT, -Dye Allergies.

## 2024-07-05 NOTE — Patient Instructions (Signed)

## 2024-07-05 NOTE — Progress Notes (Signed)
 Mariah Rice - 53 y.o. female MRN [id number]  Date of birth: 1971/04/05  Office Visit Note: Visit Date: 07/05/2024 PCP: Romelle Booty, MD Referred by: Georgina Ozell LABOR, MD  Subjective: Chief Complaint  Patient presents with   Neck - Pain   HPI:  Mariah Rice is a 53 y.o. female who comes in today at the request of Dr. Ozell Georgina for planned Right C7-T1 Cervical Interlaminar epidural steroid injection with fluoroscopic guidance.  The patient has failed conservative care including home exercise, medications, time and activity modification.  This injection will be diagnostic and hopefully therapeutic.  Please see requesting physician notes for further details and justification.   ROS Otherwise per HPI.  Assessment & Plan: Visit Diagnoses:    ICD-10-CM   1. Radiculopathy, cervical region  M54.12 XR C-ARM NO REPORT    Epidural Steroid injection    methylPREDNISolone  acetate (DEPO-MEDROL ) injection 40 mg      Plan: No additional findings.   Meds & Orders:  Meds ordered this encounter  Medications   methylPREDNISolone  acetate (DEPO-MEDROL ) injection 40 mg    Orders Placed This Encounter  Procedures   XR C-ARM NO REPORT   Epidural Steroid injection    Follow-up: Return for visit to requesting provider as needed.   Procedures: No procedures performed  Cervical Epidural Steroid Injection - Interlaminar Approach with Fluoroscopic Guidance  Patient: Mariah Rice      Date of Birth: 1971-01-23 MRN: [id number] PCP: Romelle Booty, MD      Visit Date: 07/05/2024   Universal Protocol:    Date/Time: 07/21/259:15 AM  Consent Given By: the patient  Position: PRONE  Additional Comments: Vital signs were monitored before and after the procedure. Patient was prepped and draped in the usual sterile fashion. The correct patient, procedure, and site was verified.   Injection Procedure Details:   Procedure diagnoses: Radiculopathy, cervical region [M54.12]    Meds  Administered:  Meds ordered this encounter  Medications   methylPREDNISolone  acetate (DEPO-MEDROL ) injection 40 mg     Laterality: Right  Location/Site: C7-T1  Needle: 3.5 in., 20 ga. Tuohy  Needle Placement: Paramedian epidural space  Findings:  -Comments: Excellent flow of contrast into the epidural space.  Procedure Details: Using a paramedian approach from the side mentioned above, the region overlying the inferior lamina was localized under fluoroscopic visualization and the soft tissues overlying this structure were infiltrated with 4 ml. of 1% Lidocaine  without Epinephrine . A # 20 gauge, Tuohy needle was inserted into the epidural space using a paramedian approach.  The epidural space was localized using loss of resistance along with contralateral oblique bi-planar fluoroscopic views.  After negative aspirate for air, blood, and CSF, a 2 ml. volume of Isovue-250 was injected into the epidural space and the flow of contrast was observed. Radiographs were obtained for documentation purposes.   The injectate was administered into the level noted above.  Additional Comments:  The patient tolerated the procedure well Dressing: 2 x 2 sterile gauze and Band-Aid    Post-procedure details: Patient was observed during the procedure. Post-procedure instructions were reviewed.  Patient left the clinic in stable condition.   Clinical History: MRI CERVICAL SPINE WITHOUT CONTRAST   TECHNIQUE: Multiplanar, multisequence MR imaging of the cervical spine was performed. No intravenous contrast was administered.   COMPARISON:  Radiograph from 04/26/2024.   FINDINGS: Alignment: Straightening of the normal cervical lordosis. No listhesis.   Vertebrae: Vertebral body height maintained without acute or chronic fracture. Bone marrow  signal intensity within normal limits. 1.3 cm benign hemangioma noted within the T1 vertebral body. No worrisome osseous lesions. Degenerative reactive  endplate change with mild marrow edema present at the C4-5 through C6-7 interspaces, greatest at C5-6. No other abnormal marrow edema.   Cord: Normal signal and morphology.   Posterior Fossa, vertebral arteries, paraspinal tissues: Unremarkable.   Disc levels:   C2-C3: Left-sided uncovertebral spurring without significant disc bulge. No spinal stenosis. Foramina remain patent.   C3-C4: Mild uncovertebral spurring without significant disc bulge. No canal or foraminal stenosis.   C4-C5: Mild degenerative disc space narrowing with diffuse disc bulge. Right greater than left uncovertebral spurring. Mild flattening of the ventral thecal sac without significant spinal stenosis. Mild right C5 foraminal narrowing. Left neural foramina remains patent.   C5-C6: Degenerative vertebral disc space narrowing with circumferential disc osteophyte complex, slightly asymmetric to the right. Posterior component flattens and partially effaces the ventral thecal sac with no more than mild spinal stenosis. Severe right with moderate left C6 foraminal narrowing.   C6-C7: Mild disc bulge with uncovertebral spurring. Superimposed tiny left paracentral disc protrusion (series 6, image 22). No significant spinal stenosis. Mild left C7 foraminal narrowing. Right neural foramen remains patent.   C7-T1:  Unremarkable.   IMPRESSION: 1. 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 2. Mild right C5 and left C7 foraminal stenosis related to disc bulge and uncovertebral disease. 3. Degenerative reactive endplate change with mild marrow edema at C4-5 through C6-7, greatest at C5-6. Finding could contribute to underlying neck pain.     Electronically Signed   By: Morene Hoard M.D.   On: 05/16/2024 18:59     Objective:  VS:  HT:    WT:   BMI:     BP:(!) 160/100  HR:62bpm  TEMP: ( )  RESP:  Physical Exam Vitals  and nursing note reviewed.  Constitutional:      General: She is not in acute distress.    Appearance: Normal appearance. She is not ill-appearing.  HENT:     Head: Normocephalic and atraumatic.     Right Ear: External ear normal.     Left Ear: External ear normal.  Eyes:     Extraocular Movements: Extraocular movements intact.  Cardiovascular:     Rate and Rhythm: Normal rate.     Pulses: Normal pulses.  Musculoskeletal:     Cervical back: Tenderness present. No rigidity.     Right lower leg: No edema.     Left lower leg: No edema.     Comments: Patient has good strength in the upper extremities including 5 out of 5 strength in wrist extension long finger flexion and APB.  There is no atrophy of the hands intrinsically.  There is a negative Hoffmann's test.   Lymphadenopathy:     Cervical: No cervical adenopathy.  Skin:    Findings: No erythema, lesion or rash.  Neurological:     General: No focal deficit present.     Mental Status: She is alert and oriented to person, place, and time.     Sensory: No sensory deficit.     Motor: No weakness or abnormal muscle tone.     Coordination: Coordination normal.  Psychiatric:        Mood and Affect: Mood normal.        Behavior: Behavior normal.      Imaging: No results found.

## 2024-07-06 MED ORDER — PREGABALIN 75 MG PO CAPS: 75.0000 mg | ORAL_CAPSULE | Freq: Two times a day (BID) | ORAL | 1 refills | Status: DC

## 2024-07-11 ENCOUNTER — Other Ambulatory Visit (HOSPITAL_COMMUNITY): Payer: Self-pay | Admitting: Physician Assistant

## 2024-07-11 DIAGNOSIS — F411 Generalized anxiety disorder: Secondary | ICD-10-CM

## 2024-07-15 ENCOUNTER — Telehealth (HOSPITAL_COMMUNITY): Payer: Self-pay

## 2024-07-15 NOTE — Telephone Encounter (Addendum)
 Patient called in requesting refills on Clonazepam  0.5 mg and oxcarbazepine  600 mg. Last seen by provider 05/12/24. Next appointment 08/11/24.  Will send to provider for consideration.    Patient called in Friday and left voicemail. Patient request for Oxcarbazepine  was denied and is requesting understanding.

## 2024-07-16 ENCOUNTER — Other Ambulatory Visit (HOSPITAL_COMMUNITY): Payer: Self-pay | Admitting: Physician Assistant

## 2024-07-16 DIAGNOSIS — F411 Generalized anxiety disorder: Secondary | ICD-10-CM

## 2024-07-16 MED ORDER — CLONAZEPAM 0.5 MG PO TABS
0.5000 mg | ORAL_TABLET | ORAL | 0 refills | Status: DC | PRN
Start: 2024-07-16 — End: 2024-08-12

## 2024-07-16 NOTE — Progress Notes (Signed)
 Provider was contacted by Mariah Rice regarding patient's request for clonazepam  refill.  PDMP was reviewed prior to refilling patient's prescription.  Patient's medication to be e- to pharmacy of choice.

## 2024-07-19 ENCOUNTER — Other Ambulatory Visit (HOSPITAL_COMMUNITY): Payer: Self-pay | Admitting: Physician Assistant

## 2024-07-19 ENCOUNTER — Ambulatory Visit

## 2024-07-19 DIAGNOSIS — F3130 Bipolar disorder, current episode depressed, mild or moderate severity, unspecified: Secondary | ICD-10-CM

## 2024-07-19 MED ORDER — OXCARBAZEPINE 600 MG PO TABS: 600.0000 mg | ORAL_TABLET | Freq: Two times a day (BID) | ORAL | 3 refills | Status: DC

## 2024-07-19 NOTE — Telephone Encounter (Signed)
Message acknowledged and reviewed. Patient's medications were e-prescribed to pharmacy of choice.

## 2024-07-19 NOTE — Progress Notes (Signed)
Patient's medication was e-prescribed to pharmacy of choice.

## 2024-07-20 ENCOUNTER — Encounter: Payer: Self-pay | Admitting: Family Medicine

## 2024-07-21 ENCOUNTER — Ambulatory Visit (INDEPENDENT_AMBULATORY_CARE_PROVIDER_SITE_OTHER): Admitting: Family Medicine

## 2024-07-21 ENCOUNTER — Ambulatory Visit: Admitting: Orthopedic Surgery

## 2024-07-21 DIAGNOSIS — M5412 Radiculopathy, cervical region: Secondary | ICD-10-CM

## 2024-07-21 DIAGNOSIS — R21 Rash and other nonspecific skin eruption: Secondary | ICD-10-CM

## 2024-07-21 MED ORDER — PREGABALIN 100 MG PO CAPS: 100.0000 mg | ORAL_CAPSULE | Freq: Two times a day (BID) | ORAL | 0 refills | Status: DC

## 2024-07-21 MED ORDER — ACYCLOVIR 400 MG PO TABS: 400.0000 mg | ORAL_TABLET | Freq: Three times a day (TID) | ORAL | 0 refills | Status: AC

## 2024-07-21 MED ORDER — LIDOCAINE VISCOUS HCL 2 % MT SOLN: 15.0000 mL | OROMUCOSAL | 0 refills | Status: AC | PRN

## 2024-07-21 NOTE — Patient Instructions (Addendum)
 It was great to see you! Thank you for allowing me to participate in your care!  Our plans for today:  - The rash in your mouth is likely caused by a virus. - I have sent the medication Acyclovir  to your pharmacy. You will take this 3 times daily for 5 days. - I have also sent oral lidocaine  to your pharmacy.  - This should help with your pain. - Please make sure to follow-up if not improving.   Please arrive 15 minutes PRIOR to your next scheduled appointment time! If you do not, this affects OTHER patients' care.  Take care and seek immediate care sooner if you develop any concerns.   Ozell Provencal, MD, PGY-3 [facility] 8:44 AM 07/21/2024  [facility]

## 2024-07-21 NOTE — Progress Notes (Signed)
    SUBJECTIVE:   CHIEF COMPLAINT / HPI: mouth sores  Mouth sores Start Thursday or Friday of last week. Maybe had something hot. Woke up the next day. Could not drink her coffee. Brushes 3-4 times per day, maybe scratched the sores on the top of her mouth Had been unable to eat since Saturday Throat has also started hurting Is able to drink liquids, only drinking water  Has had headache, but think this is her migraines Has cough that has not changed (COPD) Has been able to use inhaler still Does not think she has thrush Tried oragel helped some Has not been sexually active in 6 years  PERTINENT  PMH / PSH: Tobacco abuse, GAD  OBJECTIVE:   BP (!) 130/94   Pulse 81   Ht 5' 3 (1.6 m)   Wt 187 lb 3.2 oz (84.9 kg)   LMP  (LMP Unknown) Comment: on Megace   SpO2 100%   BMI 33.16 kg/m   General: NAD, well appearing Neuro: A&O HEENT: no cervical lymphadenopathy, erythematous ulcers across the left upper palate Cardiovascular: RRR, no murmurs,  Respiratory: normal WOB on RA, CTAB, no wheezes, ronchi or rales Extremities: Moving all 4 extremities equally, no peripheral edema   ASSESSMENT/PLAN:   Assessment & Plan 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  May represent undifferentiated virus however, will empirically treat with acyclovir  for 100 mg 3 times a day for 5 days.  HSV culture collected today.  Discussed causes and transmission of HSV.  Recommended to discussed testing of partner.  Viscous lidocaine  sent for pain relief.   Return if symptoms worsen or fail to improve.  Mariah Provencal, MD [facility]

## 2024-07-21 NOTE — Progress Notes (Signed)
 Orthopedic Office Note  Patient is seen today in follow-up for her cervical radiculopathy.  She is still having the same symptoms.  She did notice significant improvement with the epidural injection and felt that she was able to get more sleep and her pain was better controlled.  However, that injection has worn off.  She is now back to her usual pain.  She has found the Lyrica  helpful and has not experienced any side effects.    On exam, she has 5 out of 5 strength in all upper extremity myotomes.  Sensation is intact to light touch in C5-T1 nerve distributions bilaterally.  I talked to her today about her options going forward.  She has tried multiple medications and has found the Lyrica  to be somewhat helpful.  She asked if this could be done.  She is currently on the 75 mg twice daily dose.  I did tell her there was room to go up.  I told her I typically will go up slowly on this medication and would stop if there are any side effects.  I prescribed her with 100 mg twice daily today.  If she is doing well without any side effects at her next visit, we can go further.  Her remaining options would be surgical or pain management if she does not get satisfactory relief with increasing doses of Lyrica .  However, she would need to be nicotine  free prior to any elective spine surgery.   Ozell DELENA Ada, MD Orthopedic Surgeon

## 2024-07-23 ENCOUNTER — Encounter: Payer: Self-pay | Admitting: Family Medicine

## 2024-07-23 ENCOUNTER — Ambulatory Visit (INDEPENDENT_AMBULATORY_CARE_PROVIDER_SITE_OTHER): Admitting: Family Medicine

## 2024-07-23 VITALS — BP 135/93 | HR 70 | Ht 63.0 in | Wt 184.8 lb

## 2024-07-23 DIAGNOSIS — Z23 Encounter for immunization: Secondary | ICD-10-CM

## 2024-07-23 DIAGNOSIS — Z1231 Encounter for screening mammogram for malignant neoplasm of breast: Secondary | ICD-10-CM | POA: Diagnosis not present

## 2024-07-23 DIAGNOSIS — Z1211 Encounter for screening for malignant neoplasm of colon: Secondary | ICD-10-CM | POA: Diagnosis not present

## 2024-07-23 DIAGNOSIS — Z111 Encounter for screening for respiratory tuberculosis: Secondary | ICD-10-CM

## 2024-07-23 DIAGNOSIS — Z021 Encounter for pre-employment examination: Secondary | ICD-10-CM | POA: Diagnosis not present

## 2024-07-23 DIAGNOSIS — Z0000 Encounter for general adult medical examination without abnormal findings: Secondary | ICD-10-CM | POA: Diagnosis not present

## 2024-07-23 LAB — POCT GLYCOSYLATED HEMOGLOBIN (HGB A1C): Hemoglobin A1C: 5.5 % (ref 4.0–5.6)

## 2024-07-23 NOTE — Progress Notes (Signed)
    SUBJECTIVE:   Chief compliant/HPI: annual examination  Laysa M Kjos is a 53 y.o. who presents today for an annual exam.    Just got a job at [employer], needs TB test done. 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  Has lost weight which she is happy about  Checks her blood pressure regularly at home and states it usually runs 130/70  Denies headaches, chest pain, shortness of breath, abdominal pain, syncope  Still smokes about 1 pack a day, not interested in quitting at this time but we discussed whenever she is ready she can let us  know  She is okay with doing mammogram and colonoscopy screening    OBJECTIVE:   BP (!) 135/93   Pulse 70   Ht 5' 3 (1.6 m)   Wt 184 lb 12.8 oz (83.8 kg)   LMP  (LMP Unknown) Comment: on Megace   SpO2 97%   BMI 32.74 kg/m   General: NAD, pleasant, able to participate in exam Cardiac: RRR, no murmurs auscultated Respiratory: CTAB, normal WOB Abdomen: soft, non-tender, non-distended, normoactive bowel sounds Extremities: warm and well perfused, no edema or cyanosis Skin: warm and dry, no rashes noted Neuro: alert, no obvious focal deficits, speech normal Psych: Normal affect and mood     07/23/2024    1:58 PM 07/21/2024    8:33 AM 05/12/2024    2:46 PM  PHQ9 SCORE ONLY  PHQ-9 Total Score 10 12      Information is confidential and restricted. Go to Review Flowsheets to unlock data.     ASSESSMENT/PLAN:   Assessment & Plan Annual physical exam Screening mammogram for breast cancer Colon cancer screening A1c normal Ordered mammogram and colonoscopy referral Tdap vaccine given today.  Patient to schedule appointment for hepatitis B Pre-employment examination Tb test for employer [employer] schools) Updated Tdap.  Hep B as above  BP slightly elevated today but patient reports normal readings at home.  She also reports that she has recently been working a night shift and feels her BP may be elevated due to this.   She is on amlodipine  and clonidine .  Do not feel she needs any additional treatment at this time, follow-up at future visits  Annual Examination  See AVS for age appropriate recommendations  PHQ score 10, reviewed and discussed.  BP reviewed and at goal .   Considered the following items based upon USPSTF recommendations: Diabetes screening: ordered    Cervical cancer screening: s/p hysterectomy Breast cancer screening: Ordered mammogram Colorectal cancer screening: discussed, colonoscopy ordered Lung cancer screening: Up-to-date    Follow up in 34mo or sooner if indicated.  MyChart Activation: Already signed up  Payton Coward, MD [facility]

## 2024-07-23 NOTE — Patient Instructions (Addendum)
 Please schedule your mammogram  You should get a call to schedule your colonoscopy Your A1c is normal Please return to get your hepatitis B vaccine  Best of luck with your new job  I will let you know if any results from today are abnormal or require treatment.  Otherwise I will send you a letter in the mail or send you a message on MyChart

## 2024-07-26 LAB — HERPES SIMPLEX VIRUS CULTURE

## 2024-07-27 ENCOUNTER — Ambulatory Visit: Payer: Self-pay | Admitting: Family Medicine

## 2024-07-27 ENCOUNTER — Ambulatory Visit (INDEPENDENT_AMBULATORY_CARE_PROVIDER_SITE_OTHER)

## 2024-07-27 DIAGNOSIS — Z23 Encounter for immunization: Secondary | ICD-10-CM

## 2024-07-27 NOTE — Progress Notes (Signed)
 Patient presents to nurse clinic for Hep B vaccine.  Vaccine administered without complication.  See admin for details.

## 2024-07-28 ENCOUNTER — Ambulatory Visit: Payer: Self-pay | Admitting: Family Medicine

## 2024-07-28 LAB — QUANTIFERON-TB GOLD PLUS
QuantiFERON Mitogen Value: 8.86 [IU]/mL
QuantiFERON Nil Value: 0.01 [IU]/mL
QuantiFERON TB1 Ag Value: 0.01 [IU]/mL
QuantiFERON TB2 Ag Value: 0.01 [IU]/mL
QuantiFERON-TB Gold Plus: NEGATIVE

## 2024-08-11 ENCOUNTER — Telehealth (HOSPITAL_COMMUNITY): Admitting: Physician Assistant

## 2024-08-11 DIAGNOSIS — F3130 Bipolar disorder, current episode depressed, mild or moderate severity, unspecified: Secondary | ICD-10-CM

## 2024-08-11 DIAGNOSIS — F411 Generalized anxiety disorder: Secondary | ICD-10-CM

## 2024-08-12 ENCOUNTER — Encounter (HOSPITAL_COMMUNITY): Payer: Self-pay | Admitting: Physician Assistant

## 2024-08-12 MED ORDER — OXCARBAZEPINE 600 MG PO TABS: 600.0000 mg | ORAL_TABLET | Freq: Two times a day (BID) | ORAL | 3 refills | Status: DC

## 2024-08-12 MED ORDER — CLONAZEPAM 0.5 MG PO TABS: 0.5000 mg | ORAL_TABLET | ORAL | 0 refills | Status: DC | PRN
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
## 2024-08-14 ENCOUNTER — Other Ambulatory Visit: Payer: Self-pay | Admitting: Family Medicine

## 2024-08-14 DIAGNOSIS — G8929 Other chronic pain: Secondary | ICD-10-CM

## 2024-08-21 ENCOUNTER — Other Ambulatory Visit: Payer: Self-pay | Admitting: Family Medicine

## 2024-08-21 ENCOUNTER — Other Ambulatory Visit: Payer: Self-pay | Admitting: Orthopedic Surgery

## 2024-08-21 DIAGNOSIS — Z90722 Acquired absence of ovaries, bilateral: Secondary | ICD-10-CM

## 2024-08-25 ENCOUNTER — Ambulatory Visit: Admitting: Orthopedic Surgery

## 2024-09-06 ENCOUNTER — Telehealth: Payer: Self-pay | Admitting: Neurology

## 2024-09-06 NOTE — Telephone Encounter (Signed)
 Pt is concern about her injection needing every 28 days . Pt stated that she wants to make sure that the pharmacy will continue sending her the medicines for her migraines headache either if it is after 28 days. Thanks

## 2024-09-07 ENCOUNTER — Other Ambulatory Visit (HOSPITAL_COMMUNITY): Payer: Self-pay

## 2024-09-07 ENCOUNTER — Telehealth: Payer: Self-pay | Admitting: Pharmacy Technician

## 2024-09-07 NOTE — Telephone Encounter (Signed)
 Pharmacy Patient Advocate Encounter   Received notification from Pt Calls Messages that prior authorization for EMGALITY  120MG  is required/requested.   Insurance verification completed.   The patient is insured through [employer] .   Per test claim: PA required; PA submitted to above mentioned insurance via Latent Key/confirmation #/EOC [id number] Status is pending

## 2024-09-07 NOTE — Telephone Encounter (Signed)
 Ultimately we cannot control when the pharmacy gets the medication in. That is one issue with these monthly injections. I order for every 28 days if possible. But if she gets it in 30 days or 31 days, that is okay too. Some physicians write the medication for every 30 days (and rarely some insurances will only approve it for every 30 days).     PA team can you check to see if a PA is needed please.

## 2024-09-07 NOTE — Telephone Encounter (Signed)
 PA has been submitted, and telephone encounter has been created. Please see telephone encounter dated 9.23.25.

## 2024-09-08 ENCOUNTER — Other Ambulatory Visit (HOSPITAL_COMMUNITY): Payer: Self-pay

## 2024-09-08 NOTE — Telephone Encounter (Signed)
 Pharmacy Patient Advocate Encounter  Received notification from [facility] that Prior Authorization for EMGALITY  120MG  has been APPROVED from 9.24.25 to 3.23.26. Ran test claim, Copay is $35. This test claim was processed through [facility]- copay amounts may vary at other pharmacies due to pharmacy/plan contracts, or as the patient moves through the different stages of their insurance plan.   PA #/Case ID/Reference #: [id number]

## 2024-09-14 ENCOUNTER — Other Ambulatory Visit: Payer: Self-pay | Admitting: Family Medicine

## 2024-09-14 DIAGNOSIS — G8929 Other chronic pain: Secondary | ICD-10-CM

## 2024-09-15 ENCOUNTER — Ambulatory Visit: Admitting: Orthopedic Surgery

## 2024-09-15 ENCOUNTER — Ambulatory Visit: Payer: Self-pay | Admitting: Neurology

## 2024-09-15 DIAGNOSIS — M5412 Radiculopathy, cervical region: Secondary | ICD-10-CM

## 2024-09-15 NOTE — Progress Notes (Signed)
 Orthopedic Office Note  Patient comes in today for routine follow-up.  She is still having pain that radiates into the right upper extremity.  She is currently satisfied with her pain control with Lyrica  100 mg twice daily and periodic injections.  She said her pain is not completely gone but she can tolerate the pain level that she has at now.  She does not want to change up the plan.  On exam, she has intact sensation to light touch in C5-T1 distributions bilaterally.  She has got 5 out of 5 strength in all upper extremity myotomes.  Negative Hoffmann, no interosseous muscle wasting seen, negative grip and release test.  Plan: -Will continue Lyrica  100 mg twice daily -Can do periodic injections as needed -Talked about surgery and pain management as next steps in treatment should her pain become worse or uncontrolled with current treatment -Patient will return to the office in 6 7 weeks, x-rays at next visit: None

## 2024-09-19 NOTE — Progress Notes (Unsigned)
 NEUROLOGY FOLLOW UP OFFICE NOTE  Mariah Rice [id number]  Assessment/Plan:   Migraine without aura, without status migrainosus, not intractable.   Migraine prevention:  Emgality  *** Migraine rescue:  rizatriptan  10mg ; Zofran  for nausea. *** Limit use of pain relievers to no more than 9 days out of the month to prevent risk of rebound or medication-overuse headache. Keep headache diary Follow up 6 months.       Subjective:  Mariah Rice is a 53 year old female with COPD, HTN and hyperthyroidism who follows up for migraines.  UPDATE: *** Intensity:  mild Duration:  5 minutes with rizatriptan  Frequency:  every other day. Current NSAIDS/analgesics:  none Current triptans:  rizatriptan  10mg  Current ergotamine:  none Current anti-emetic:  ondansetron  4mg  Current muscle relaxants:  methocarbamol  750mg  Current Antihypertensive medications:  clonidine , amlodipine  Current Antidepressant medications:  none Current Anticonvulsant medications:  oxycarbazepine 600mg  BID Current anti-CGRP:  Emgality  Current Vitamins/Herbal/Supplements:  none Current Antihistamines/Decongestants:  hydroxyzine , Flonase , Zyrtec  Other therapy:  none Hormone/birth control:  estradiol  Other medications:  albuterol      Caffeine :  2 cups coffee daily, [place]  iced tea  Alcohol:  occasional  Smoker:  1 ppd Diet:  No soda.  Water  Exercise:  tries to walk but just had foot surgery Depression:  yes; Anxiety:  yes.  Bipolar disorder Other pain:  neck pain, shoulder pain bilaterally Sleep hygiene:  yes.  Takes amitriptyline   HISTORY:  Onset:  her 30s Location:  starts in back of eyes and spreads to top of head Quality:  pounding Intensity:  severe.   Aura:  absent Prodrome:  absent Associated symptoms:  Photophobia, phonophobia, nausea.  She denies associated unilateral numbness or weakness. Duration:  1 day Frequency:  every 2 days Frequency of abortive medication: Tylenol  4 days a  week Triggers:  unknown Relieving factors:  cold rag on forehead and rest in dark room Activity:  cannot function about once a week   MRI of brain with and without contrast on 10/31/2020 personally reviewed was unremarkable.   Also history of neck and right sided cervical radicular pain.  MRI of cervical spine on 12/26/2021 personally reviewed revealed degenerative changes at C5-6 with mild spinal stenosis and severe right/moderate left neural foraminal stenosis as well as mild degenerative changes at C4-5 without  significant spinal canal or neural foraminal stenosis.     Past NSAIDS/analgesics:  Fioricet, ibuprofen , naproxen , acetaminophen , diclofenac , tramadol  Past abortive triptans:  sumatriptan  100mg  Past abortive ergotamine:  none Past muscle relaxants:  tizanidine , cyclobenzaprine  Past anti-emetic:  promethazine  Past antihypertensive medications:  propranolol  ER 60mg  daily (80mg  exacerbated cough and concern with COPD), metoprolol , HCTZ Past antidepressant medications:  amitriptyline , duloxetine, sertraline , bupropion , citalopram  Past anticonvulsant medications:  topiramate , Depakote, lamotrigine , gabapentin  Past anti-CGRP:  Aimovig  140mg  (effective - insurance problem) Past vitamins/Herbal/Supplements:  none Past antihistamines/decongestants:  Benadryl  Other past therapies:  none    Family history of headache:  mom  PAST MEDICAL HISTORY: Past Medical History:  Diagnosis Date   Abnormal finding on GI tract imaging    Anaphylaxis 07/26/2020   Anxiety    COPD (chronic obstructive pulmonary disease) (HCC)    COPD exacerbation (HCC) 07/17/2020   Dental abscess    Depression    Family history of breast cancer    Family history of breast cancer 01/26/2019   Family history of ovarian cancer    Genetic testing 03/08/2019   Negative genetic testing on the CancerNext-Expanded+RNAinsight testing.  The CancerNext-Expanded gene panel offered by [employer] and  includes sequencing  and rearrangement analysis for the following 67 genes: AIP, ALK, APC*, ATM*, BAP1, BARD1, BLM, BMPR1A, BRCA1*, BRCA2*, BRIP1*, CDH1*, CDK4, CDKN1B, CDKN2A, CHEK2*, DICER1, FANCC, FH, FLCN, GALNT12, HOXB13, MAX, MEN1, MET, MLH1*, MRE11A, MSH2*   GERD (gastroesophageal reflux disease)    Headache    migraines   Hypertension    Hyperthyroidism 1990s   PONV (postoperative nausea and vomiting)    Shortness of breath dyspnea    with anxiety   Small bowel obstruction (HCC)    Tick bite of abdomen 05/13/2017    MEDICATIONS: Current Outpatient Medications on File Prior to Visit  Medication Sig Dispense Refill   albuterol  (VENTOLIN  HFA) 108 (90 Base) MCG/ACT inhaler INHALE 2 PUFFS INTO THE LUNGS EVERY 6 HOURS AS NEEDED 18 each 3   amLODipine  (NORVASC ) 5 MG tablet TAKE 1 TABLET DAILY. INCREASE TO 2 TABS DAILY IF BLOOD PRESSURE > 140/90 AFTER 1 WEEK. 60 tablet 5   cetirizine  (ZYRTEC ) 10 MG tablet TAKE 1 TABLET BY MOUTH EVERY DAY (Patient not taking: Reported on 07/23/2024) 90 tablet 1   clonazePAM  (KLONOPIN ) 0.5 MG tablet Take 1 tablet (0.5 mg total) by mouth as needed for anxiety. 30 tablet 0   cloNIDine  (CATAPRES ) 0.1 MG tablet TAKE 1 TABLET BY MOUTH TWICE A DAY 60 tablet 5   EPINEPHrine  0.3 mg/0.3 mL IJ SOAJ injection Inject 0.3 mg into the muscle as needed for anaphylaxis (for difficulty breathing, throat, tongue or lip swelling. must come to ED after use.). (Patient not taking: Reported on 03/11/2024) 1 each 0   estradiol  (ESTRACE ) 2 MG tablet TAKE 1 TABLET BY MOUTH EVERY DAY 30 tablet 5   fluticasone  (FLONASE ) 50 MCG/ACT nasal spray Place 1 spray into both nostrils daily. 48 mL 3   Galcanezumab -gnlm (EMGALITY ) 120 MG/ML SOAJ Inject 120 mg into the skin every 28 (twenty-eight) days. 1.12 mL 11   lidocaine  (XYLOCAINE ) 2 % solution Use as directed 15 mLs in the mouth or throat as needed for mouth pain. 100 mL 0   methocarbamol  (ROBAXIN ) 750 MG tablet TAKE 1 TABLET TWO TIMES DAILY AS NEEDED 60 tablet 0    omeprazole  (PRILOSEC) 20 MG capsule TAKE 1 CAPSULE BY MOUTH EVERY DAY 30 capsule 5   ondansetron  (ZOFRAN ) 4 MG tablet Take 1 tablet (4 mg total) by mouth every 8 (eight) hours as needed for nausea or vomiting. 20 tablet 5   oxcarbazepine  (TRILEPTAL ) 600 MG tablet Take 1 tablet (600 mg total) by mouth 2 (two) times daily. 60 tablet 3   pregabalin  (LYRICA ) 100 MG capsule TAKE 1 CAPSULE BY MOUTH TWICE A DAY 60 capsule 0   rizatriptan  (MAXALT ) 10 MG tablet Take 1 tablet (10 mg total) by mouth as needed for migraine. May repeat in 2 hours if needed.  Maximum 2 tablets in 24 hours. 10 tablet 5   rosuvastatin  (CRESTOR ) 10 MG tablet Take 1 tablet (10 mg total) by mouth daily. 90 tablet 2   Semaglutide -Weight Management (WEGOVY ) 0.25 MG/0.5ML SOAJ Inject 0.25 mg into the skin once a week. (Patient not taking: Reported on 03/11/2024) 2 mL 0   SPIRIVA  RESPIMAT 2.5 MCG/ACT AERS INHALE 2 PUFFS BY MOUTH INTO THE LUNGS DAILY 30 each 3   Zoster Vaccine Adjuvanted (SHINGRIX ) injection Administer Shingrix  vaccination now and repeat in two months (Patient not taking: Reported on 03/11/2024) 1 each 1   No current facility-administered medications on file prior to visit.      ALLERGIES: Allergies  Allergen Reactions   Amoxicillin   Anaphylaxis   Azithromycin  Anaphylaxis   Meloxicam  Nausea And Vomiting   Sumatriptan  Nausea Only, Rash and Other (See Comments)    Throat felt like it was closing up   Penicillins Nausea Only and Rash    Has patient had a PCN reaction causing immediate rash, facial/tongue/throat swelling, SOB or lightheadedness with hypotension: no Has patient had a PCN reaction causing severe rash involving mucus membranes or skin necrosis:unknown Has patient had a PCN reaction that required hospitalization : yes Has patient had a PCN reaction occurring within the last 10 years: no If all of the above answers are NO, then may proceed with Cephalosporin use.    Sulfa Antibiotics Nausea Only and  Rash    FAMILY HISTORY: Family History  Problem Relation Age of Onset   Ovarian cancer Mother 38       d. 37   Heart disease Father    Hypertension Father    COPD Father    Hypertension Paternal Aunt 29   Lung cancer Paternal Aunt 48   Breast cancer Paternal Aunt 40   Thyroid  disease Maternal Grandmother    Lymphoma Maternal Grandmother 37       NHL, recurrance at 69   Lung cancer Maternal Grandmother 61   Hypertension Paternal Grandmother    Heart disease Paternal Grandmother    Heart Problems Paternal Grandmother    Dementia Paternal Grandmother    Colon cancer Neg Hx    Esophageal cancer Neg Hx    Stomach cancer Neg Hx    Pancreatic cancer Neg Hx       Objective:  *** General: No acute distress.  Patient appears well-groomed.   Head:  Normocephalic/atraumatic Neck:  Supple.  No paraspinal tenderness.  Full range of motion. Heart:  Regular rate and rhythm. Neuro:  Alert and oriented.  Speech fluent and not dysarthric.  Language intact.  CN II-XII intact.  Bulk and tone normal.  Muscle strength 5/5 throughout.  Sensation to light touch intact.  Deep tendon reflexes 2+ throughout, toes downgoing.  Gait normal.  Romberg negative.      Juliene Dunnings, DO  CC: Payton Coward, MD

## 2024-09-20 ENCOUNTER — Ambulatory Visit: Payer: Self-pay | Admitting: Neurology

## 2024-09-20 ENCOUNTER — Telehealth: Payer: Self-pay | Admitting: Pharmacy Technician

## 2024-09-20 ENCOUNTER — Other Ambulatory Visit (HOSPITAL_COMMUNITY): Payer: Self-pay

## 2024-09-20 ENCOUNTER — Encounter: Payer: Self-pay | Admitting: Neurology

## 2024-09-20 VITALS — BP 136/81 | HR 73 | Ht 64.0 in | Wt 185.0 lb

## 2024-09-20 DIAGNOSIS — G43001 Migraine without aura, not intractable, with status migrainosus: Secondary | ICD-10-CM

## 2024-09-20 MED ORDER — ONDANSETRON HCL 4 MG PO TABS: 4.0000 mg | ORAL_TABLET | Freq: Three times a day (TID) | ORAL | 5 refills | Status: AC | PRN

## 2024-09-20 MED ORDER — RIZATRIPTAN BENZOATE 10 MG PO TABS: 10.0000 mg | ORAL_TABLET | ORAL | 5 refills | Status: AC | PRN

## 2024-09-20 NOTE — Telephone Encounter (Signed)
 Pharmacy Patient Advocate Encounter  Received notification from [employer] that Prior Authorization for EMGALITY  120MG  has been APPROVED from 10.6.25 to 10.6.26. Ran test claim, Copay is $4. This test claim was processed through [facility]- copay amounts may vary at other pharmacies due to pharmacy/plan contracts, or as the patient moves through the different stages of their insurance plan.   PA #/Case ID/Reference #: [id number]

## 2024-09-20 NOTE — Telephone Encounter (Signed)
 Pharmacy Patient Advocate Encounter   Received notification from Pt Calls Messages that prior authorization for EMGALITY  120MG  is required/requested.   Insurance verification completed.   The patient is insured through [employer].   Per test claim: PA required; PA submitted to above mentioned insurance via Latent Key/confirmation #/EOC [id number] Status is pending

## 2024-09-20 NOTE — Patient Instructions (Signed)
 Emgality  every 4 weeks Rizatriptan  and ondansetron  as needed

## 2024-09-22 ENCOUNTER — Other Ambulatory Visit: Payer: Self-pay | Admitting: Orthopedic Surgery

## 2024-09-24 ENCOUNTER — Encounter: Payer: Self-pay | Admitting: Family Medicine

## 2024-10-04 ENCOUNTER — Other Ambulatory Visit: Payer: Self-pay

## 2024-10-04 DIAGNOSIS — I2510 Atherosclerotic heart disease of native coronary artery without angina pectoris: Secondary | ICD-10-CM

## 2024-10-04 DIAGNOSIS — E782 Mixed hyperlipidemia: Secondary | ICD-10-CM

## 2024-10-04 DIAGNOSIS — Z79899 Other long term (current) drug therapy: Secondary | ICD-10-CM

## 2024-10-05 ENCOUNTER — Encounter: Payer: Self-pay | Admitting: Family Medicine

## 2024-10-05 ENCOUNTER — Ambulatory Visit: Admitting: Family Medicine

## 2024-10-05 VITALS — BP 115/75 | HR 70 | Ht 64.0 in | Wt 182.2 lb

## 2024-10-05 DIAGNOSIS — Z90722 Acquired absence of ovaries, bilateral: Secondary | ICD-10-CM

## 2024-10-05 DIAGNOSIS — J302 Other seasonal allergic rhinitis: Secondary | ICD-10-CM | POA: Diagnosis not present

## 2024-10-05 DIAGNOSIS — Z23 Encounter for immunization: Secondary | ICD-10-CM

## 2024-10-05 DIAGNOSIS — I2510 Atherosclerotic heart disease of native coronary artery without angina pectoris: Secondary | ICD-10-CM | POA: Diagnosis not present

## 2024-10-05 DIAGNOSIS — Z79899 Other long term (current) drug therapy: Secondary | ICD-10-CM | POA: Diagnosis not present

## 2024-10-05 DIAGNOSIS — K219 Gastro-esophageal reflux disease without esophagitis: Secondary | ICD-10-CM

## 2024-10-05 DIAGNOSIS — E782 Mixed hyperlipidemia: Secondary | ICD-10-CM | POA: Diagnosis not present

## 2024-10-05 DIAGNOSIS — M545 Low back pain, unspecified: Secondary | ICD-10-CM | POA: Diagnosis not present

## 2024-10-05 DIAGNOSIS — G8929 Other chronic pain: Secondary | ICD-10-CM

## 2024-10-05 DIAGNOSIS — Z720 Tobacco use: Secondary | ICD-10-CM | POA: Diagnosis not present

## 2024-10-05 DIAGNOSIS — I10 Essential (primary) hypertension: Secondary | ICD-10-CM | POA: Diagnosis not present

## 2024-10-05 DIAGNOSIS — R0989 Other specified symptoms and signs involving the circulatory and respiratory systems: Secondary | ICD-10-CM | POA: Diagnosis not present

## 2024-10-05 MED ORDER — OMEPRAZOLE 20 MG PO CPDR: 20.0000 mg | DELAYED_RELEASE_CAPSULE | Freq: Every day | ORAL | 5 refills | Status: AC

## 2024-10-05 MED ORDER — ALBUTEROL SULFATE HFA 108 (90 BASE) MCG/ACT IN AERS: 2.0000 | INHALATION_SPRAY | Freq: Four times a day (QID) | RESPIRATORY_TRACT | 3 refills | Status: AC | PRN

## 2024-10-05 MED ORDER — CLONIDINE HCL 0.1 MG PO TABS: 0.1000 mg | ORAL_TABLET | Freq: Two times a day (BID) | ORAL | 5 refills | Status: AC

## 2024-10-05 MED ORDER — AMLODIPINE BESYLATE 5 MG PO TABS: 5.0000 mg | ORAL_TABLET | Freq: Every day | ORAL | 5 refills | Status: DC

## 2024-10-05 MED ORDER — SPIRIVA RESPIMAT 2.5 MCG/ACT IN AERS: 2.0000 | INHALATION_SPRAY | Freq: Every day | RESPIRATORY_TRACT | 3 refills | Status: AC

## 2024-10-05 MED ORDER — METHOCARBAMOL 750 MG PO TABS: ORAL_TABLET | ORAL | 0 refills | Status: DC

## 2024-10-05 MED ORDER — ESTRADIOL 2 MG PO TABS
2.0000 mg | ORAL_TABLET | Freq: Every day | ORAL | 5 refills | Status: AC
Start: 2024-10-05 — End: ?

## 2024-10-05 MED ORDER — FEXOFENADINE HCL 60 MG PO TABS: 60.0000 mg | ORAL_TABLET | Freq: Two times a day (BID) | ORAL | 1 refills | Status: AC

## 2024-10-05 MED ORDER — ROSUVASTATIN CALCIUM 10 MG PO TABS: 10.0000 mg | ORAL_TABLET | Freq: Every day | ORAL | 2 refills | Status: AC

## 2024-10-05 NOTE — Patient Instructions (Signed)
 I have sent in refills of medications we talked about  Please schedule your colonoscopy and mammogram when able  Let me know when you'd like to stop smoking - its dangerous for you to be smoking especially while on estradiol 

## 2024-10-05 NOTE — Assessment & Plan Note (Signed)
 Discussed risk of smoking in great detail Hopefully she will be interested in smoking cessation soon. She will reach out when this is the case We discussed several options to help with this including patch, gum, lozenge, medication. Not interested at this time but knows she needs to stop soon  Discussed risk of smoking while on estradiol 

## 2024-10-05 NOTE — Progress Notes (Signed)
    SUBJECTIVE:   CHIEF COMPLAINT / HPI:   Here for med refills and flu shot  She is currently smoking about 1-1.5 ppd  Dealing with a lot of stress right now in caring for her grandma Knows she needs to stop smoking but does not feel like she can do so right now Understands it's very risky for her to keep smoking especially while on estradiol  Plans to let me know as soon as she's ready to stop smoking  She's under a lot of stress recently in caring for her sick grandmother and smoking helps with this. She follows with psych as well and is on klonopin  for anxiety. Has also restarted smoking marijuana.   Additionally requesting new med for seasonal allergies Has tried zyrtec  in the past but interested in a different antihistamine, she didn't like zyrtec  much. No side effects just didn't work well Has not tried allegra Using flonase  regularly   PERTINENT  PMH / PSH: COPD, HTN  OBJECTIVE:   BP 115/75   Pulse 70   Ht 5' 4 (1.626 m)   Wt 182 lb 4 oz (82.7 kg)   LMP  (LMP Unknown) Comment: on Megace   SpO2 97%   BMI 31.28 kg/m    General: NAD, pleasant, able to participate in exam Respiratory: No respiratory distress Skin: warm and dry, no rashes noted Psych: Normal affect and mood  ASSESSMENT/PLAN:     Assessment & Plan Tobacco abuse Discussed risk of smoking in great detail Hopefully she will be interested in smoking cessation soon. She will reach out when this is the case We discussed several options to help with this including patch, gum, lozenge, medication. Not interested at this time but knows she needs to stop soon  Discussed risk of smoking while on estradiol  Medication management Sent multiple medication refills Seasonal allergies Start allegra 60mg  BID for seasonal allergies Encounter for immunization Flu shot today  Discussed colonosocpy and mammogram, these orders were previously placed and pt plans to schedule soon once stressors with family  pass  Payton Coward, MD [facility]

## 2024-10-12 ENCOUNTER — Encounter: Payer: Self-pay | Admitting: Family Medicine

## 2024-10-12 DIAGNOSIS — R059 Cough, unspecified: Secondary | ICD-10-CM

## 2024-10-12 MED ORDER — BENZONATATE 100 MG PO CAPS: 100.0000 mg | ORAL_CAPSULE | Freq: Two times a day (BID) | ORAL | 0 refills | Status: AC | PRN

## 2024-10-18 ENCOUNTER — Encounter: Payer: Self-pay | Admitting: Radiology

## 2024-10-24 ENCOUNTER — Other Ambulatory Visit: Payer: Self-pay | Admitting: Orthopedic Surgery

## 2024-10-27 ENCOUNTER — Ambulatory Visit: Admitting: Orthopedic Surgery

## 2024-10-27 DIAGNOSIS — M5412 Radiculopathy, cervical region: Secondary | ICD-10-CM

## 2024-10-27 NOTE — Progress Notes (Signed)
 Orthopedic Office Note  Patient comes in today with neck pain that radiates into her right upper extremity.  The pain has gotten worse since she was last seen.  She is not ready for any kind of surgical intervention.  She has been pleased with how Lyrica  has been helping.  She was curious if there was a higher dose of Lyrica  that she could try.  She also wanted to do a repeat injection since they have been giving her about 3-4 months of relief.  She rates her pain as a 8 out of 10 at its worst.  On exam, she has 5 out of 5 strength in all upper extremity myotomes.  Sensation is intact to light touch in C5-T1 distributions bilaterally.  Plan: -Increase Lyrica  to 125 mg twice daily -Ordered a repeat cervical ESI -Surgery or pain management would be the next steps in management should conservative treatments fail to provide her with lasting relief -With her insurance, she would need to do 6 weeks of PT prior to any surgical intervention -Patient should return to the office in 3 months, x-rays at next visit: none   Mariah DELENA Ada, MD Orthopedic Surgeon

## 2024-10-28 ENCOUNTER — Encounter: Payer: Self-pay | Admitting: Orthopedic Surgery

## 2024-10-28 MED ORDER — PREGABALIN 100 MG PO CAPS: 100.0000 mg | ORAL_CAPSULE | Freq: Two times a day (BID) | ORAL | 0 refills | Status: DC

## 2024-10-28 MED ORDER — PREGABALIN 25 MG PO CAPS: 25.0000 mg | ORAL_CAPSULE | Freq: Two times a day (BID) | ORAL | 0 refills | Status: DC

## 2024-11-02 ENCOUNTER — Telehealth (HOSPITAL_COMMUNITY): Payer: Self-pay | Admitting: Physician Assistant

## 2024-11-10 ENCOUNTER — Telehealth (HOSPITAL_COMMUNITY): Admitting: Physician Assistant

## 2024-11-14 ENCOUNTER — Other Ambulatory Visit (HOSPITAL_COMMUNITY): Payer: Self-pay | Admitting: Physician Assistant

## 2024-11-14 DIAGNOSIS — F411 Generalized anxiety disorder: Secondary | ICD-10-CM

## 2024-11-15 ENCOUNTER — Other Ambulatory Visit: Payer: Self-pay | Admitting: Radiology

## 2024-11-15 ENCOUNTER — Encounter: Payer: Self-pay | Admitting: Orthopedic Surgery

## 2024-11-15 ENCOUNTER — Encounter: Payer: Self-pay | Admitting: Physical Medicine and Rehabilitation

## 2024-11-15 DIAGNOSIS — M5412 Radiculopathy, cervical region: Secondary | ICD-10-CM

## 2024-11-15 DIAGNOSIS — M542 Cervicalgia: Secondary | ICD-10-CM

## 2024-11-17 ENCOUNTER — Telehealth (HOSPITAL_COMMUNITY): Payer: Self-pay

## 2024-11-17 ENCOUNTER — Other Ambulatory Visit (HOSPITAL_COMMUNITY): Payer: Self-pay | Admitting: Physician Assistant

## 2024-11-17 DIAGNOSIS — F3130 Bipolar disorder, current episode depressed, mild or moderate severity, unspecified: Secondary | ICD-10-CM

## 2024-11-17 MED ORDER — OXCARBAZEPINE 600 MG PO TABS: 600.0000 mg | ORAL_TABLET | Freq: Two times a day (BID) | ORAL | 2 refills | Status: DC

## 2024-11-17 NOTE — Telephone Encounter (Signed)
Message acknowledged and reviewed. Patient's medications to be e-prescribed to pharmacy of choice.

## 2024-11-17 NOTE — Telephone Encounter (Signed)
 Patient called in requesting refill of medications. Patient next appointment is 11/30/24.  oxcarbazepine  (TRILEPTAL ) 600 MG tablet  clonazePAM  (KLONOPIN ) 0.5 MG tablet

## 2024-11-17 NOTE — Progress Notes (Signed)
 Provider was contacted by Aundra A. Trudy, RN regarding patient's medication refill request. Patient's medication to be e-prescribed to pharmacy of choice.

## 2024-11-17 NOTE — Telephone Encounter (Signed)
 Provider to bridge patient's medications. Patient's medications to be e-prescribed to pharmacy of choice.

## 2024-11-18 ENCOUNTER — Other Ambulatory Visit: Payer: Self-pay | Admitting: Physical Medicine and Rehabilitation

## 2024-11-18 ENCOUNTER — Telehealth: Payer: Self-pay

## 2024-11-18 MED ORDER — DIAZEPAM 5 MG PO TABS: ORAL_TABLET | ORAL | 0 refills | Status: AC

## 2024-11-18 NOTE — Telephone Encounter (Signed)
 Pre procedural Valium

## 2024-11-21 ENCOUNTER — Other Ambulatory Visit: Payer: Self-pay | Admitting: Family Medicine

## 2024-11-21 ENCOUNTER — Encounter: Payer: Self-pay | Admitting: Orthopedic Surgery

## 2024-11-21 DIAGNOSIS — G8929 Other chronic pain: Secondary | ICD-10-CM

## 2024-11-22 MED ORDER — PREGABALIN 100 MG PO CAPS: 100.0000 mg | ORAL_CAPSULE | Freq: Two times a day (BID) | ORAL | 2 refills | Status: AC

## 2024-11-29 ENCOUNTER — Other Ambulatory Visit: Payer: Self-pay

## 2024-11-29 ENCOUNTER — Ambulatory Visit: Admitting: Physical Medicine and Rehabilitation

## 2024-11-29 VITALS — BP 136/90 | HR 60

## 2024-11-29 DIAGNOSIS — M5412 Radiculopathy, cervical region: Secondary | ICD-10-CM

## 2024-11-29 MED ORDER — METHYLPREDNISOLONE ACETATE 40 MG/ML IJ SUSP
40.0000 mg | Freq: Once | INTRAMUSCULAR | Status: AC
  Administered 2024-11-29: 08:00:00 40 mg

## 2024-11-29 NOTE — Progress Notes (Signed)
 Mariah Rice EDDS - 53 y.o. female MRN [id number]  Date of birth: 1971-11-20  Office Visit Note: Visit Date: 11/29/2024 PCP: Romelle Booty, MD Referred by: Romelle Booty, MD  Subjective: Chief Complaint  Patient presents with   Neck - Pain   HPI:  Mariah Rice is a 53 y.o. female who comes in today at the request of Dr. Ozell Ada for planned Right C7-T1 Cervical Interlaminar epidural steroid injection with fluoroscopic guidance.  The patient has failed conservative care including home exercise, medications, time and activity modification.  This injection will be diagnostic and hopefully therapeutic.  Please see requesting physician notes for further details and justification.   ROS Otherwise per HPI.  Assessment & Plan: Visit Diagnoses:    ICD-10-CM   1. Cervical radiculopathy  M54.12 XR C-ARM NO REPORT    Epidural Steroid injection    methylPREDNISolone  acetate (DEPO-MEDROL ) injection 40 mg      Plan: No additional findings.   Meds & Orders:  Meds ordered this encounter  Medications   methylPREDNISolone  acetate (DEPO-MEDROL ) injection 40 mg    Orders Placed This Encounter  Procedures   XR C-ARM NO REPORT   Epidural Steroid injection    Follow-up: No follow-ups on file.   Procedures: No procedures performed  Cervical Epidural Steroid Injection - Interlaminar Approach with Fluoroscopic Guidance  Patient: Mariah Rice      Date of Birth: 1971/03/16 MRN: [id number] PCP: Romelle Booty, MD      Visit Date: 11/29/2024   Universal Protocol:    Date/Time: 12/15/20258:19 AM  Consent Given By: the patient  Position: PRONE  Additional Comments: Vital signs were monitored before and after the procedure. Patient was prepped and draped in the usual sterile fashion. The correct patient, procedure, and site was verified.   Injection Procedure Details:   Procedure diagnoses: Cervical radiculopathy [M54.12]    Meds Administered:  Meds ordered this encounter   Medications   methylPREDNISolone  acetate (DEPO-MEDROL ) injection 40 mg     Laterality: Right  Location/Site: C7-T1  Needle: 3.5 in., 20 ga. Tuohy  Needle Placement: Paramedian epidural space  Findings:  -Comments: Excellent flow of contrast into the epidural space.  Procedure Details: Using a paramedian approach from the side mentioned above, the region overlying the inferior lamina was localized under fluoroscopic visualization and the soft tissues overlying this structure were infiltrated with 4 ml. of 1% Lidocaine  without Epinephrine . A # 20 gauge, Tuohy needle was inserted into the epidural space using a paramedian approach.  The epidural space was localized using loss of resistance along with contralateral oblique bi-planar fluoroscopic views.  After negative aspirate for air, blood, and CSF, a 2 ml. volume of Isovue-250 was injected into the epidural space and the flow of contrast was observed. Radiographs were obtained for documentation purposes.   The injectate was administered into the level noted above.  Additional Comments:  The patient tolerated the procedure well Dressing: 2 x 2 sterile gauze and Band-Aid    Post-procedure details: Patient was observed during the procedure. Post-procedure instructions were reviewed.  Patient left the clinic in stable condition.   Clinical History: MRI CERVICAL SPINE WITHOUT CONTRAST   TECHNIQUE: Multiplanar, multisequence MR imaging of the cervical spine was performed. No intravenous contrast was administered.   COMPARISON:  Radiograph from 04/26/2024.   FINDINGS: Alignment: Straightening of the normal cervical lordosis. No listhesis.   Vertebrae: Vertebral body height maintained without acute or chronic fracture. Bone marrow signal intensity within normal limits. 1.3 cm  benign hemangioma noted within the T1 vertebral body. No worrisome osseous lesions. Degenerative reactive endplate change with mild marrow edema  present at the C4-5 through C6-7 interspaces, greatest at C5-6. No other abnormal marrow edema.   Cord: Normal signal and morphology.   Posterior Fossa, vertebral arteries, paraspinal tissues: Unremarkable.   Disc levels:   C2-C3: Left-sided uncovertebral spurring without significant disc bulge. No spinal stenosis. Foramina remain patent.   C3-C4: Mild uncovertebral spurring without significant disc bulge. No canal or foraminal stenosis.   C4-C5: Mild degenerative disc space narrowing with diffuse disc bulge. Right greater than left uncovertebral spurring. Mild flattening of the ventral thecal sac without significant spinal stenosis. Mild right C5 foraminal narrowing. Left neural foramina remains patent.   C5-C6: Degenerative vertebral disc space narrowing with circumferential disc osteophyte complex, slightly asymmetric to the right. Posterior component flattens and partially effaces the ventral thecal sac with no more than mild spinal stenosis. Severe right with moderate left C6 foraminal narrowing.   C6-C7: Mild disc bulge with uncovertebral spurring. Superimposed tiny left paracentral disc protrusion (series 6, image 22). No significant spinal stenosis. Mild left C7 foraminal narrowing. Right neural foramen remains patent.   C7-T1:  Unremarkable.   IMPRESSION: 1. 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 2. Mild right C5 and left C7 foraminal stenosis related to disc bulge and uncovertebral disease. 3. Degenerative reactive endplate change with mild marrow edema at C4-5 through C6-7, greatest at C5-6. Finding could contribute to underlying neck pain.     Electronically Signed   By: Morene Hoard M.D.   On: 05/16/2024 18:59     Objective:  VS:  HT:    WT:   BMI:     BP:(!) 136/90  HR:60bpm  TEMP: ( )  RESP:  Physical Exam Vitals and nursing note reviewed.   Constitutional:      General: She is not in acute distress.    Appearance: Normal appearance. She is not ill-appearing.  HENT:     Head: Normocephalic and atraumatic.     Right Ear: External ear normal.     Left Ear: External ear normal.  Eyes:     Extraocular Movements: Extraocular movements intact.  Cardiovascular:     Rate and Rhythm: Normal rate.     Pulses: Normal pulses.  Musculoskeletal:     Cervical back: Tenderness present. No rigidity.     Right lower leg: No edema.     Left lower leg: No edema.     Comments: Patient has good strength in the upper extremities including 5 out of 5 strength in wrist extension long finger flexion and APB.  There is no atrophy of the hands intrinsically.  There is a negative Hoffmann's test.   Lymphadenopathy:     Cervical: No cervical adenopathy.  Skin:    Findings: No erythema, lesion or rash.  Neurological:     General: No focal deficit present.     Mental Status: She is alert and oriented to person, place, and time.     Sensory: No sensory deficit.     Motor: No weakness or abnormal muscle tone.     Coordination: Coordination normal.  Psychiatric:        Mood and Affect: Mood normal.        Behavior: Behavior normal.      Imaging: No results found.

## 2024-11-29 NOTE — Progress Notes (Signed)
 Pain Scale   Average Pain 8 Patient advising she has Chronic neck pain radiating to bilateral shoulders and pain is chronic.        +Driver, -BT, -Dye Allergies.

## 2024-11-29 NOTE — Procedures (Signed)
 Cervical Epidural Steroid Injection - Interlaminar Approach with Fluoroscopic Guidance  Patient: Mariah Rice      Date of Birth: [birth year] MRN: [id number] PCP: Romelle Booty, MD      Visit Date: 11/29/2024   Universal Protocol:    Date/Time: 12/15/20258:19 AM  Consent Given By: the patient  Position: PRONE  Additional Comments: Vital signs were monitored before and after the procedure. Patient was prepped and draped in the usual sterile fashion. The correct patient, procedure, and site was verified.   Injection Procedure Details:   Procedure diagnoses: Cervical radiculopathy [M54.12]    Meds Administered:  Meds ordered this encounter  Medications   methylPREDNISolone  acetate (DEPO-MEDROL ) injection 40 mg     Laterality: Right  Location/Site: C7-T1  Needle: 3.5 in., 20 ga. Tuohy  Needle Placement: Paramedian epidural space  Findings:  -Comments: Excellent flow of contrast into the epidural space.  Procedure Details: Using a paramedian approach from the side mentioned above, the region overlying the inferior lamina was localized under fluoroscopic visualization and the soft tissues overlying this structure were infiltrated with 4 ml. of 1% Lidocaine  without Epinephrine . A # 20 gauge, Tuohy needle was inserted into the epidural space using a paramedian approach.  The epidural space was localized using loss of resistance along with contralateral oblique bi-planar fluoroscopic views.  After negative aspirate for air, blood, and CSF, a 2 ml. volume of Isovue-250 was injected into the epidural space and the flow of contrast was observed. Radiographs were obtained for documentation purposes.   The injectate was administered into the level noted above.  Additional Comments:  The patient tolerated the procedure well Dressing: 2 x 2 sterile gauze and Band-Aid    Post-procedure details: Patient was observed during the procedure. Post-procedure instructions were  reviewed.  Patient left the clinic in stable condition.

## 2024-11-30 ENCOUNTER — Encounter (HOSPITAL_COMMUNITY): Payer: Self-pay | Admitting: Physician Assistant

## 2024-11-30 ENCOUNTER — Telehealth (INDEPENDENT_AMBULATORY_CARE_PROVIDER_SITE_OTHER): Admitting: Physician Assistant

## 2024-11-30 DIAGNOSIS — F411 Generalized anxiety disorder: Secondary | ICD-10-CM

## 2024-11-30 DIAGNOSIS — F3130 Bipolar disorder, current episode depressed, mild or moderate severity, unspecified: Secondary | ICD-10-CM

## 2024-11-30 MED ORDER — OXCARBAZEPINE 600 MG PO TABS: 600.0000 mg | ORAL_TABLET | Freq: Two times a day (BID) | ORAL | 3 refills | Status: AC

## 2024-11-30 MED ORDER — CLONAZEPAM 0.5 MG PO TABS: 0.5000 mg | ORAL_TABLET | ORAL | 0 refills | Status: AC | PRN
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
## 2024-12-06 ENCOUNTER — Telehealth: Payer: Self-pay | Admitting: Orthopedic Surgery

## 2024-12-06 NOTE — Telephone Encounter (Signed)
 Pt states she received a letter from [facility] stating that she needs to go to pain management in [place] that she was referred by her provider that she sees for her neck. Pt states she was not aware of the referral please call to confirm.

## 2024-12-06 NOTE — Telephone Encounter (Signed)
 I called and advised patient that we have not referred her to Pain Management (PM) but that we did refer her to Dr. Eldonna and is qualified to do PM but he doesn't do the Narcotic stuff, that he only preforms injections, and he does not go to the [place] office, he is only in [place] on [address].

## 2024-12-10 ENCOUNTER — Ambulatory Visit: Admitting: Orthopedic Surgery

## 2024-12-10 ENCOUNTER — Other Ambulatory Visit (INDEPENDENT_AMBULATORY_CARE_PROVIDER_SITE_OTHER): Payer: Self-pay

## 2024-12-10 DIAGNOSIS — M25552 Pain in left hip: Secondary | ICD-10-CM | POA: Diagnosis not present

## 2024-12-10 DIAGNOSIS — M542 Cervicalgia: Secondary | ICD-10-CM | POA: Diagnosis not present

## 2024-12-10 MED ORDER — HYDROCODONE-ACETAMINOPHEN 5-325 MG PO TABS: 1.0000 | ORAL_TABLET | Freq: Four times a day (QID) | ORAL | 0 refills | Status: AC | PRN

## 2024-12-10 NOTE — Progress Notes (Signed)
 Orthopedic Spine Surgery Office Note   Assessment: Patient is a 53 y.o. female with neck pain that radiates into the bilateral shoulders and down the right upper extremity to the radial forearm.  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 Also, reporting acute onset of left hip pain     Plan: -Patient has tried tylenol , voltaren  gel, prednisone , gabapentin , lyrica  , cervical ESI -Patient is no longer responding to cervical ESI.  She has tried multiple conservative treatments without relief of her neck and radiating arm pain.  PT would be her last remaining conservative treatment.  Sending referral for that -In regards to her hip, I prescribed Norco to help with the acute pain.  I also recommended PT for this as well.  If she does not do better with that treatment, could consider a left hip intra-articular injection -Talked about C5/6 ACDF as a treatment option for her if PT does not help her neck and arm pain -Would need to be nicotine  free prior to any elective spine surgery.  She is still using nicotine , encouraged cessation again today -Patient should return to office in 8 weeks, x-rays at next visit: AP/lateral/flex/ex cervical   ___________________________________________________________________________     History:   Patient is a 53 y.o. female who presents today for follow-up on her cervical spine and for acute onset of left hip pain.  Patient states that she has had worsening of her chronic neck and right arm pain.  She feels the pain in the bilateral trapezius muscles and into the right lateral arm and radial forearm.  She got an epidural injection after our last visit but that did not give her any significant relief.  She feels the pain with activity and at rest.  She has difficulty getting comfortable at night to sleep as a result of the pain.  She also comes in today for left hip pain.  She noticed acute onset of left hip pain about 2 weeks ago.  She was helping pick up one of her  relatives when she noted the onset of the pain.  She has felt it in the left groin region.  It does not radiate down the leg.  She has no right-sided symptoms.  She has been trying over-the-counter medications without any relief.  Pain has been similar to how it was when it started.  Pain is worse with weightbearing and improves with rest. Has been weight bearing in regular shoes without any assistive devices.    Treatments tried: Tylenol , Voltaren  gel, prednisone , gabapentin , lyrica , cervical ESI     Physical Exam:   General: no acute distress, appears stated age Neurologic: alert, answering questions appropriately, following commands Respiratory: unlabored breathing on room air, symmetric chest rise Psychiatric: appropriate affect, normal cadence to speech     MSK (spine):   -Strength exam                                                   Left                  Right Grip strength                5/5                  5/5 Interosseus  5/5                  5/5 Wrist extension            5/5                  5/5 Wrist flexion                 5/5                  5/5 Elbow flexion                5/5                  4+/5 Deltoid                          5/5                  5/5   -Sensory exam                           Sensation intact to light touch in C5-T1 nerve distributions of bilateral upper extremities  Left hip exam: Positive FADIR, positive Stinchfield, negative FABER, negative Gaenslen's, no pain with external rotation at the hip     Imaging: XRs of the cervical spine from 04/27/2023 were previously independently reviewed and interpreted, showing disc height loss at C5/6.  No evidence of instability on flexion/extension views.  No fracture or dislocation seen.   MRI of the cervical spine from 05/14/2024 was previously independently reviewed and interpreted, showing bilateral foraminal stenosis at C5/6.  No other significant stenosis seen.  No T2 cord signal  change seen.  XRs of the left hip from 12/10/2024 were independently reviewed and interpreted, showing small amount of joint space narrowing in the hip joint.  No fracture or dislocation seen.     Patient name: Mariah Rice Patient MRN: [id number] Date of visit: 12/10/2024

## 2024-12-15 ENCOUNTER — Encounter: Payer: Self-pay | Admitting: Family Medicine

## 2024-12-20 ENCOUNTER — Other Ambulatory Visit: Payer: Self-pay | Admitting: Family Medicine

## 2024-12-20 DIAGNOSIS — I10 Essential (primary) hypertension: Secondary | ICD-10-CM

## 2024-12-20 MED ORDER — AMLODIPINE BESYLATE 5 MG PO TABS: 5.0000 mg | ORAL_TABLET | Freq: Every day | ORAL | 5 refills | Status: AC

## 2024-12-21 DIAGNOSIS — M545 Low back pain, unspecified: Secondary | ICD-10-CM

## 2024-12-30 ENCOUNTER — Ambulatory Visit: Admitting: Physical Therapy

## 2024-12-30 NOTE — Progress Notes (Unsigned)
 " OUTPATIENT PHYSICAL THERAPY SHOULDER EVALUATION   Patient Name: Mariah Rice MRN: [id number] DOB:[birth year], 54 y.o., female Today's Date: 12/30/2024    Past Medical History:  Diagnosis Date   Abnormal finding on GI tract imaging    Anaphylaxis 07/26/2020   Anxiety    COPD (chronic obstructive pulmonary disease) (HCC)    COPD exacerbation (HCC) 07/17/2020   Dental abscess    Depression    Family history of breast cancer    Family history of breast cancer 01/26/2019   Family history of ovarian cancer    Genetic testing 03/08/2019   Negative genetic testing on the CancerNext-Expanded+RNAinsight testing.  The CancerNext-Expanded gene panel offered by [facility] and includes sequencing and rearrangement analysis for the following 67 genes: AIP, ALK, APC*, ATM*, BAP1, BARD1, BLM, BMPR1A, BRCA1*, BRCA2*, BRIP1*, CDH1*, CDK4, CDKN1B, CDKN2A, CHEK2*, DICER1, FANCC, FH, FLCN, GALNT12, HOXB13, MAX, MEN1, MET, MLH1*, MRE11A, MSH2*   GERD (gastroesophageal reflux disease)    Headache    migraines   Hypertension    Hyperthyroidism 1990s   PONV (postoperative nausea and vomiting)    Shortness of breath dyspnea    with anxiety   Small bowel obstruction (HCC)    Tick bite of abdomen 05/13/2017   Past Surgical History:  Procedure Laterality Date   BALLOON ENTEROSCOPY  02/07/2022   Procedure: BALLOON ENTEROSCOPY;  Surgeon: San Sandor GAILS, DO;  Location: [facility];  Service: Gastroenterology;;   BIOPSY  02/07/2022   Procedure: BIOPSY;  Surgeon: San Sandor GAILS, DO;  Location: [facility];  Service: Gastroenterology;;   DILATION AND CURETTAGE OF UTERUS  1999   ENTEROSCOPY N/A 05/12/2019   Procedure: ENTEROSCOPY;  Surgeon: Legrand Victory LITTIE DOUGLAS, MD;  Location: [facility];  Service: Gastroenterology;  Laterality: N/A;   ENTEROSCOPY N/A 02/07/2022   Procedure: ENTEROSCOPY;  Surgeon: San Sandor GAILS, DO;  Location: [facility];  Service: Gastroenterology;  Laterality: N/A;   single balloon   FOOT SURGERY Right    INCISE AND DRAIN ABCESS  2005   LAPAROSCOPIC ASSISTED VAGINAL HYSTERECTOMY N/A 07/11/2015   Procedure: LAPAROSCOPIC ASSISTED VAGINAL Total HYSTERECTOMY;  Surgeon: Gloris DELENA Hugger, MD;  Location: [facility];  Service: Gynecology;  Laterality: N/A;   LAPAROSCOPIC BILATERAL SALPINGECTOMY Bilateral 07/11/2015   Procedure: LAPAROSCOPIC BILATERAL SALPINGO OOPHORECTOMY ;  Surgeon: Gloris DELENA Hugger, MD;  Location: [facility];  Service: Gynecology;  Laterality: Bilateral;   tubal ligation  2000   WISDOM TOOTH EXTRACTION     XI ROBOT ASSISTED DIAGNOSTIC LAPAROSCOPY N/A 10/16/2021   Procedure: XI ROBOT ASSISTED DIAGNOSTIC LAPAROSCOPY;  Surgeon: Desiderio Schanz, MD;  Location: [facility];  Service: General;  Laterality: N/A;   Patient Active Problem List   Diagnosis Date Noted   Actinic keratosis 09/03/2023   Liver hemangioma 07/15/2023   Chest pain 05/23/2023   Class 2 severe obesity with serious comorbidity and body mass index (BMI) of 36.0 to 36.9 in adult 02/06/2023   Sleep disturbance 10/16/2022   PTSD (post-traumatic stress disorder) 05/29/2022   Bipolar I disorder, most recent episode depressed (HCC) 05/29/2022   Generalized abdominal pain    Small bowel intussusception (HCC)    Pre-operative clearance 10/11/2021   Closed nondisplaced fracture of right calcaneus with routine healing 01/05/2021   Plantar fasciitis 09/12/2020   Sinus disorder 08/02/2020   Bilateral leg edema 08/02/2020   Anxiety and depression    Abnormal finding on GI tract imaging    Hyponatremia 05/03/2019   COPD suggested by initial evaluation 02/15/2019   Chronic  bilateral low back pain without sciatica 06/06/2018   Prediabetes 05/31/2018   Migraine without aura and with status migrainosus, not intractable 11/30/2017   Itching 05/13/2017   Neck pain 03/26/2017   Mood disorder 10/06/2016   Right ear pain 07/23/2016   GERD (gastroesophageal reflux disease) 10/19/2015   H/O bilateral  oophorectomy 08/13/2015   Midline back pain 03/15/2015   GAD (generalized anxiety disorder) 01/06/2013   Tobacco abuse 11/10/2012   Obesity (BMI 35.0-39.9 without comorbidity) 11/10/2012   Hypertension 11/10/2012    PCP: Romelle Booty, MD  REFERRING PROVIDER: Romelle Booty, MD  THERAPY DIAG:  No diagnosis found.  REFERRING DIAG: Pain of left hip [M25.552], Neck pain [M54.2]   Rationale for Evaluation and Treatment:  Rehabilitation  SUBJECTIVE:  PERTINENT PAST HISTORY:  Migraine, COPD, anxiety and depression, PTSD, bipolar        PRECAUTIONS: {Therapy precautions:24002}  WEIGHT BEARING RESTRICTIONS {Yes ***/No:24003}  FALLS:  Has patient fallen in last 6 months? {yes/no:20286}, Number of falls: ***  MOI/History of condition:  Onset date: ***  SUBJECTIVE STATEMENT  Pt is a 54 y.o. female who presents to clinic with chief complaint of ***.  ***  From referring provider:  -Patient is no longer responding to cervical ESI.  She has tried multiple conservative treatments without relief of her neck and radiating arm pain.  PT would be her last remaining conservative treatment.  Sending referral for that -In regards to her hip, I prescribed Norco to help with the acute pain.  I also recommended PT for this as well.  If she does not do better with that treatment, could consider a left hip intra-articular injection -Talked about C5/6 ACDF as a treatment option for her if PT does not help her neck and arm pain    Red flags:  {has/denies:26543} {kerredflag:26542}  Pain:  Are you having pain? {yes/no:20286} Pain location: *** NPRS scale:  Best: {NUMBERS; 0-10:5044}/10, Worst: {NUMBERS; 0-10:5044}/10 Aggravating factors: *** Relieving factors: *** Pain description: {PAIN DESCRIPTION:21022940}  Occupation: ***  Assistive Device: ***  Hand Dominance: ***  Patient Goals/Specific Activities: ***   OBJECTIVE:   DIAGNOSTIC FINDINGS:  ***  GENERAL  OBSERVATION: ***     SENSATION: Light touch: {intact/deficits:24005}   PALPATION: ***  Cervical ROM  ROM ROM  (Eval)  Flexion ***  Extension ***  Right lateral flexion ***  Left lateral flexion ***  Right rotation ***  Left rotation ***  Flexion rotation (normal is 30 degrees)   Flexion rotation (normal is 30 degrees)     (Blank rows = not tested, N = WNL, * = concordant pain)  UPPER EXTREMITY MMT:  MMT Right (Eval) Left (Eval)  Shoulder flexion    Shoulder abduction (C5)    Shoulder ER    Shoulder IR    Middle trapezius    Lower trapezius    Shoulder extension    Grip strength    Shoulder shrug (C4)    Elbow flexion (C6)    Elbow ext (C7)    Thumb ext (C8)    Finger abd (T1)    Grossly     (Blank rows = not tested, score listed is out of 5 possible points.  N = WNL, D = diminished, C = clear for gross weakness with myotome testing, * = concordant pain with testing)  LE ROM:  ROM Right (Eval) Left (Eval)  Hip flexion    Hip extension    Hip abduction    Hip adduction    Hip  internal rotation    Hip external rotation    Knee extension    Knee flexion    Ankle dorsiflexion    Ankle plantarflexion    Ankle inversion    Ankle eversion     (Blank rows = not tested, N = WNL, * = concordant pain with testing)  SPECIAL TESTS:  Spurling's: ***  JOINT MOBILITY TESTING:  ***  PATIENT SURVEYS:  NDI: ***    HOME EXERCISE PROGRAM: ***  Treatment priorities   Eval                                                 TODAY'S TREATMENT:  Therapeutic Exercise: Creating, reviewing, and completing HEP   PATIENT EDUCATION ([place]/HM):  POC, diagnosis, prognosis, HEP, and outcome measures.  Pt educated via explanation, demonstration, and handout (HEP).  Pt confirms understanding verbally.   ASSESSMENT:  CLINICAL IMPRESSION: Gearldene is a 54 y.o. female who presents to clinic with signs and sxs consistent with ***.   ***.   Esme will benefit from  skilled PT to address relevant deficits and improve ***.   OBJECTIVE IMPAIRMENTS: Pain, ***  ACTIVITY LIMITATIONS: ***  PERSONAL FACTORS: See medical history and pertinent history   REHAB POTENTIAL: Good  CLINICAL DECISION MAKING: Evolving/moderate complexity  EVALUATION COMPLEXITY: Moderate   GOALS:   SHORT TERM GOALS: Target date: ***  [place] will be >75% HEP compliant to improve carryover between sessions and facilitate independent management of condition  Evaluation: ongoing Goal status: INITIAL   LONG TERM GOALS: Target date: ***  [place] will self report >/= 50% decrease in pain from evaluation to improve function in daily tasks  Evaluation/Baseline: ***/10 max pain Goal status: INITIAL   2.  [place] will show a >/= *** pt improvement in their NDI score (MCID ~20% or 10/50 pts) as a proxy for functional improvement  Evaluation/Baseline: *** pts Goal status: INITIAL   3.  [place] will be able to ***, not limited by pain  Evaluation/Baseline: limited Goal status: INITIAL   4.  ***   5.  ***   6.  ***   PLAN: PT FREQUENCY: 1-2x/week  PT DURATION: 8 weeks  PLANNED INTERVENTIONS:  [id number]- PT Re-evaluation, 97110-Therapeutic exercises, 97530- Therapeutic activity, 97112- Neuromuscular re-education, 97535- Self Care, 02859- Manual therapy, [id number]- Gait training, [id number]- Aquatic Therapy, [id number]- Electrical stimulation (manual), [id number]- Vasopneumatic device, [id number]- Traction (mechanical), [id number]- Ionotophoresis 4mg /ml Dexamethasone , Taping, Dry Needling, Joint manipulation, and Spinal manipulation.   Yaminah Clayborn E Gwendloyn Forsee PT 12/30/2024, 12:13 PM    "

## 2025-01-06 ENCOUNTER — Other Ambulatory Visit: Payer: Self-pay

## 2025-01-06 ENCOUNTER — Ambulatory Visit: Attending: Orthopedic Surgery | Admitting: Physical Therapy

## 2025-01-06 ENCOUNTER — Encounter: Payer: Self-pay | Admitting: Physical Therapy

## 2025-01-06 DIAGNOSIS — M542 Cervicalgia: Secondary | ICD-10-CM | POA: Diagnosis present

## 2025-01-06 DIAGNOSIS — M25552 Pain in left hip: Secondary | ICD-10-CM | POA: Insufficient documentation

## 2025-01-06 NOTE — Therapy (Unsigned)
 " OUTPATIENT PHYSICAL THERAPY SHOULDER EVALUATION   Patient Name: Joleena SAFIYAH CISNEY MRN: [id number] DOB:1971-07-10, 54 y.o., female Today's Date: 01/07/2025   PT End of Session - 01/07/25 0854     Visit Number 1    Date for Recertification  03/04/25    Authorization Type [employer]    PT Start Time 0300    PT Stop Time 0342    PT Time Calculation (min) 42 min          Past Medical History:  Diagnosis Date   Abnormal finding on GI tract imaging    Anaphylaxis 07/26/2020   Anxiety    COPD (chronic obstructive pulmonary disease) (HCC)    COPD exacerbation (HCC) 07/17/2020   Dental abscess    Depression    Family history of breast cancer    Family history of breast cancer 01/26/2019   Family history of ovarian cancer    Genetic testing 03/08/2019   Negative genetic testing on the CancerNext-Expanded+RNAinsight testing.  The CancerNext-Expanded gene panel offered by [facility] and includes sequencing and rearrangement analysis for the following 67 genes: AIP, ALK, APC*, ATM*, BAP1, BARD1, BLM, BMPR1A, BRCA1*, BRCA2*, BRIP1*, CDH1*, CDK4, CDKN1B, CDKN2A, CHEK2*, DICER1, FANCC, FH, FLCN, GALNT12, HOXB13, MAX, MEN1, MET, MLH1*, MRE11A, MSH2*   GERD (gastroesophageal reflux disease)    Headache    migraines   Hypertension    Hyperthyroidism 1990s   PONV (postoperative nausea and vomiting)    Shortness of breath dyspnea    with anxiety   Small bowel obstruction (HCC)    Tick bite of abdomen 05/13/2017   Past Surgical History:  Procedure Laterality Date   BALLOON ENTEROSCOPY  02/07/2022   Procedure: BALLOON ENTEROSCOPY;  Surgeon: San Sandor GAILS, DO;  Location: [facility];  Service: Gastroenterology;;   BIOPSY  02/07/2022   Procedure: BIOPSY;  Surgeon: San Sandor GAILS, DO;  Location: [facility];  Service: Gastroenterology;;   DILATION AND CURETTAGE OF UTERUS  1999   ENTEROSCOPY N/A 05/12/2019   Procedure: ENTEROSCOPY;  Surgeon: Legrand Victory LITTIE DOUGLAS, MD;  Location:  [facility];  Service: Gastroenterology;  Laterality: N/A;   ENTEROSCOPY N/A 02/07/2022   Procedure: ENTEROSCOPY;  Surgeon: San Sandor GAILS, DO;  Location: [facility];  Service: Gastroenterology;  Laterality: N/A;  single balloon   FOOT SURGERY Right    INCISE AND DRAIN ABCESS  2005   LAPAROSCOPIC ASSISTED VAGINAL HYSTERECTOMY N/A 07/11/2015   Procedure: LAPAROSCOPIC ASSISTED VAGINAL Total HYSTERECTOMY;  Surgeon: Gloris DELENA Hugger, MD;  Location: [facility];  Service: Gynecology;  Laterality: N/A;   LAPAROSCOPIC BILATERAL SALPINGECTOMY Bilateral 07/11/2015   Procedure: LAPAROSCOPIC BILATERAL SALPINGO OOPHORECTOMY ;  Surgeon: Gloris DELENA Hugger, MD;  Location: [facility];  Service: Gynecology;  Laterality: Bilateral;   tubal ligation  2000   WISDOM TOOTH EXTRACTION     XI ROBOT ASSISTED DIAGNOSTIC LAPAROSCOPY N/A 10/16/2021   Procedure: XI ROBOT ASSISTED DIAGNOSTIC LAPAROSCOPY;  Surgeon: Desiderio Schanz, MD;  Location: [facility];  Service: General;  Laterality: N/A;   Patient Active Problem List   Diagnosis Date Noted   Actinic keratosis 09/03/2023   Liver hemangioma 07/15/2023   Chest pain 05/23/2023   Class 2 severe obesity with serious comorbidity and body mass index (BMI) of 36.0 to 36.9 in adult 02/06/2023   Sleep disturbance 10/16/2022   PTSD (post-traumatic stress disorder) 05/29/2022   Bipolar I disorder, most recent episode depressed (HCC) 05/29/2022   Generalized abdominal pain    Small bowel intussusception (HCC)  Pre-operative clearance 10/11/2021   Closed nondisplaced fracture of right calcaneus with routine healing 01/05/2021   Plantar fasciitis 09/12/2020   Sinus disorder 08/02/2020   Bilateral leg edema 08/02/2020   Anxiety and depression    Abnormal finding on GI tract imaging    Hyponatremia 05/03/2019   COPD suggested by initial evaluation 02/15/2019   Chronic bilateral low back pain without sciatica 06/06/2018   Prediabetes 05/31/2018   Migraine without aura and with  status migrainosus, not intractable 11/30/2017   Itching 05/13/2017   Neck pain 03/26/2017   Mood disorder 10/06/2016   Right ear pain 07/23/2016   GERD (gastroesophageal reflux disease) 10/19/2015   H/O bilateral oophorectomy 08/13/2015   Midline back pain 03/15/2015   GAD (generalized anxiety disorder) 01/06/2013   Tobacco abuse 11/10/2012   Obesity (BMI 35.0-39.9 without comorbidity) 11/10/2012   Hypertension 11/10/2012    PCP: Romelle Booty, MD  REFERRING PROVIDER: Georgina Ozell LABOR, MD  THERAPY DIAG:  Cervicalgia - Plan: PT plan of care cert/re-cert  Pain in left hip - Plan: PT plan of care cert/re-cert  REFERRING DIAG: Pain of left hip [M25.552], Neck pain [M54.2]   Rationale for Evaluation and Treatment:  Rehabilitation  SUBJECTIVE:  PERTINENT PAST HISTORY:  Migraine, COPD, anxiety and depression, PTSD, bipolar        PRECAUTIONS: NEEDS WEDGE  WEIGHT BEARING RESTRICTIONS No  FALLS:  Has patient fallen in last 6 months? No, Number of falls: 0  MOI/History of condition:  Onset date: ~1 year neck, <6 months hip pain  SUBJECTIVE STATEMENT  Pt is a 54 y.o. female who presents to clinic with chief complaint of neck and hip pain.  Neck pain started without clear cause.  Bil UE pain with intermittent n/t (particularly at night).  Feels like there may be some UE weakness.  No n/t currently.  Hip pain started after she lifted her elderly family members from the floor.  Isolated to anterior lateral hip.  Denies significant LE radicular sxs.  Denies clicking and locking.  Denies groin pain.  From referring provider:  -Patient is no longer responding to cervical ESI.  She has tried multiple conservative treatments without relief of her neck and radiating arm pain.  PT would be her last remaining conservative treatment.  Sending referral for that -In regards to her hip, I prescribed Norco to help with the acute pain.  I also recommended PT for this as well.  If she does not  do better with that treatment, could consider a left hip intra-articular injection -Talked about C5/6 ACDF as a treatment option for her if PT does not help her neck and arm pain   Pain:  Are you having pain? Yes Pain location: neck pain NPRS scale:  Best: 5/10, Worst: 10/10 Aggravating factors: read, looking up, reaching, L rotation Relieving factors: nothing Pain description: sharp and aching  Are you having pain? Yes Pain location: L hip NPRS scale:  Best: 0/10, Worst: 9/10 Aggravating factors: moving from sitting to standing Relieving factors: rest, gentle movement Pain description: sharp and aching  Occupation: work in [employer] Planner: NA  Hand Dominance: NA  Patient Goals/Specific Activities: reduce pain   OBJECTIVE:   DIAGNOSTIC FINDINGS:  MRI:  IMPRESSION: 1. 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 2. Mild right C5 and left C7 foraminal stenosis related to disc bulge and uncovertebral disease. 3. Degenerative reactive endplate change with mild marrow edema  at C4-5 through C6-7, greatest at C5-6. Finding could contribute to underlying neck pain.    SENSATION: Light touch: Appears intact   PALPATION: TTP anterior lateral hip/TFL  Cervical ROM  ROM ROM  (Eval)  Flexion 15*  Extension 0*  Right lateral flexion 15*  Left lateral flexion 10*  Right rotation 10*  Left rotation 15*  Flexion rotation (normal is 30 degrees)   Flexion rotation (normal is 30 degrees)     (Blank rows = not tested, N = WNL, * = concordant pain)  UPPER EXTREMITY MMT:  MMT Right (Eval) Left (Eval)  Shoulder flexion    Shoulder abduction (C5) c c  Shoulder ER    Shoulder IR    Middle trapezius    Lower trapezius    Shoulder extension    Grip strength 47 67  Shoulder shrug (C4) c c  Elbow flexion (C6) c c  Elbow ext (C7) c c  Thumb ext (C8) c c  Finger abd (T1) c c   Grossly     (Blank rows = not tested, score listed is out of 5 possible points.  N = WNL, D = diminished, C = clear for gross weakness with myotome testing, * = concordant pain with testing)  LE MMT:  MMT Right Eval Left Eval  Hip flexion (L2, L3) 4+ 4*  Knee extension (L3) 4+ 4  Knee flexion    Hip abduction 3+ 3+  Hip extension    Hip external rotation 4+ 3+  Hip internal rotation 4+ 3+*  Hip adduction    Ankle dorsiflexion (L4)    Ankle plantarflexion (S1)    Ankle inversion    Ankle eversion    Great Toe ext (L5)    Grossly     (Blank rows = not tested, score listed is out of 5 possible points.  N = WNL, D = diminished, C = clear for gross weakness with myotome testing, * = concordant pain with testing)  LE ROM:  ROM Right (Eval) Left (Eval)  Hip flexion    Hip extension    Hip abduction    Hip adduction    Hip internal rotation  Limited with pain  Hip external rotation  [facility]  Knee extension    Knee flexion    Ankle dorsiflexion    Ankle plantarflexion    Ankle inversion    Ankle eversion     (Blank rows = not tested, N = WNL, * = concordant pain with testing)  SPECIAL TESTS:  Fadir and faber (+)   PATIENT SURVEYS:  NDI: 24/50    HOME EXERCISE PROGRAM: Access Code: [email or web address] URL: [email or web address] Date: 01/06/2025 Prepared by: Helene Gasmen  Exercises - Seated Hip Adduction Isometrics with Mercer  - 1 x daily - 7 x weekly - 3 sets - 5 reps - 5'' hold - Seated Hip Abduction with Resistance  - 1 x daily - 7 x weekly - 3 sets - 8 reps - 5 sec hold - Seated Knee Lifts with Resistance  - 1 x daily - 7 x weekly - 3 sets - 3 reps - 5 sec hold  Treatment priorities   Eval        Progressive hip strengthening                                         TODAY'S TREATMENT:  Therapeutic Exercise: Creating, reviewing,  and completing HEP   PATIENT EDUCATION ([place]/[facility]):  POC, diagnosis, prognosis, HEP, and outcome measures.  Pt educated  via explanation, demonstration, and handout (HEP).  Pt confirms understanding verbally.   ASSESSMENT:  CLINICAL IMPRESSION: Hafsah is a 54 y.o. female who presents to clinic with signs and sxs consistent with chronic severe neck pain with concurrent L hip pain.   Hip pain appears articular in nature.  Cervical ROM is significantly limited in all planes with radiating sxs.  Issued HEP for hip and held on neck given irritability.  Pt unable to lay fully supine d/t COPD and requires wedge.   Ellamarie will benefit from skilled PT to address relevant deficits and improve comfort with daily tasks.   OBJECTIVE IMPAIRMENTS: Pain, L hip ROM, L hip and LE strength, cervical ROM  ACTIVITY LIMITATIONS: walking, standing, reading, head turns, driving, reaching, housework, tranfers, toileting, self care  PERSONAL FACTORS: See medical history and pertinent history   REHAB POTENTIAL: Good  CLINICAL DECISION MAKING: Evolving/moderate complexity  EVALUATION COMPLEXITY: Moderate   GOALS:   SHORT TERM GOALS: Target date: 02/04/2025   Loy will be >75% HEP compliant to improve carryover between sessions and facilitate independent management of condition  Evaluation: ongoing Goal status: INITIAL   LONG TERM GOALS: Target date: 03/04/2025   Stephannie will self report >/= 50% decrease in pain from evaluation to improve function in daily tasks  Evaluation/Baseline: 10/10 max pain neck, 9/10 hip  Goal status: INITIAL   2.  Jahni will show a >/= 8 pt improvement in their NDI score as a proxy for functional improvement  Evaluation/Baseline: 24/50 pts Goal status: INITIAL   3.  Ryane will be able to complete 5x sts, not limited by pain  Evaluation/Baseline: limited Goal status: INITIAL   4.  Lya will show a >/= 18 pt improvement in LEFS score (MCID is ~11% or 9 pts) as a proxy for functional improvement   Evaluation/Baseline: 27/80 pts Goal status: INITIAL   5.  Shaka will improve the  following MMTs to >/= 4/5 to show improvement in strength:  hip tested on eval   Evaluation/Baseline: see chart in note Goal status: INITIAL   6.  Taytem will improve show significant improvement in cervical ROM  Evaluation/Baseline: See chart Goal status: INITIAL   PLAN: PT FREQUENCY: 1-2x/week  PT DURATION: 8 weeks  PLANNED INTERVENTIONS:  [id number]- PT Re-evaluation, 97110-Therapeutic exercises, 97530- Therapeutic activity, 97112- Neuromuscular re-education, 97535- Self Care, [id number]- Manual therapy, [id number]- Gait training, [id number]- Aquatic Therapy, [id number]- Electrical stimulation (manual), [id number]- Vasopneumatic device, [id number]- Traction (mechanical), [id number]- Ionotophoresis 4mg /ml Dexamethasone , Taping, Dry Needling, Joint manipulation, and Spinal manipulation.   Helene BRAVO Mikaya Bunner PT 01/07/2025, 9:07 AM     "

## 2025-01-11 ENCOUNTER — Other Ambulatory Visit: Payer: Self-pay | Admitting: Neurology

## 2025-01-18 ENCOUNTER — Ambulatory Visit

## 2025-01-20 ENCOUNTER — Telehealth: Payer: Self-pay | Admitting: Neurology

## 2025-01-20 ENCOUNTER — Ambulatory Visit

## 2025-01-20 DIAGNOSIS — M542 Cervicalgia: Secondary | ICD-10-CM

## 2025-01-20 DIAGNOSIS — M25552 Pain in left hip: Secondary | ICD-10-CM

## 2025-01-20 NOTE — Therapy (Signed)
 " OUTPATIENT PHYSICAL THERAPY SHOULDER EVALUATION   Patient Name: Mariah Rice MRN: [id number] DOB:February 27, 1971, 54 y.o., female Today's Date: 01/20/2025   PT End of Session - 01/20/25 1359     Visit Number 2    Date for Recertification  03/04/25    Authorization Type [employer]    PT Start Time 1400    PT Stop Time 1440    PT Time Calculation (min) 40 min    Activity Tolerance Patient tolerated treatment well           Past Medical History:  Diagnosis Date   Abnormal finding on GI tract imaging    Anaphylaxis 07/26/2020   Anxiety    COPD (chronic obstructive pulmonary disease) (HCC)    COPD exacerbation (HCC) 07/17/2020   Dental abscess    Depression    Family history of breast cancer    Family history of breast cancer 01/26/2019   Family history of ovarian cancer    Genetic testing 03/08/2019   Negative genetic testing on the CancerNext-Expanded+RNAinsight testing.  The CancerNext-Expanded gene panel offered by [facility] and includes sequencing and rearrangement analysis for the following 67 genes: AIP, ALK, APC*, ATM*, BAP1, BARD1, BLM, BMPR1A, BRCA1*, BRCA2*, BRIP1*, CDH1*, CDK4, CDKN1B, CDKN2A, CHEK2*, DICER1, FANCC, FH, FLCN, GALNT12, HOXB13, MAX, MEN1, MET, MLH1*, MRE11A, MSH2*   GERD (gastroesophageal reflux disease)    Headache    migraines   Hypertension    Hyperthyroidism 1990s   PONV (postoperative nausea and vomiting)    Shortness of breath dyspnea    with anxiety   Small bowel obstruction (HCC)    Tick bite of abdomen 05/13/2017   Past Surgical History:  Procedure Laterality Date   BALLOON ENTEROSCOPY  02/07/2022   Procedure: BALLOON ENTEROSCOPY;  Surgeon: San Sandor GAILS, DO;  Location: [facility];  Service: Gastroenterology;;   BIOPSY  02/07/2022   Procedure: BIOPSY;  Surgeon: San Sandor GAILS, DO;  Location: [facility];  Service: Gastroenterology;;   DILATION AND CURETTAGE OF UTERUS  1999   ENTEROSCOPY N/A 05/12/2019   Procedure:  ENTEROSCOPY;  Surgeon: Legrand Victory LITTIE DOUGLAS, MD;  Location: [facility];  Service: Gastroenterology;  Laterality: N/A;   ENTEROSCOPY N/A 02/07/2022   Procedure: ENTEROSCOPY;  Surgeon: San Sandor GAILS, DO;  Location: [facility];  Service: Gastroenterology;  Laterality: N/A;  single balloon   FOOT SURGERY Right    INCISE AND DRAIN ABCESS  2005   LAPAROSCOPIC ASSISTED VAGINAL HYSTERECTOMY N/A 07/11/2015   Procedure: LAPAROSCOPIC ASSISTED VAGINAL Total HYSTERECTOMY;  Surgeon: Gloris DELENA Hugger, MD;  Location: [facility];  Service: Gynecology;  Laterality: N/A;   LAPAROSCOPIC BILATERAL SALPINGECTOMY Bilateral 07/11/2015   Procedure: LAPAROSCOPIC BILATERAL SALPINGO OOPHORECTOMY ;  Surgeon: Gloris DELENA Hugger, MD;  Location: [facility];  Service: Gynecology;  Laterality: Bilateral;   tubal ligation  2000   WISDOM TOOTH EXTRACTION     XI ROBOT ASSISTED DIAGNOSTIC LAPAROSCOPY N/A 10/16/2021   Procedure: XI ROBOT ASSISTED DIAGNOSTIC LAPAROSCOPY;  Surgeon: Desiderio Schanz, MD;  Location: [facility];  Service: General;  Laterality: N/A;   Patient Active Problem List   Diagnosis Date Noted   Actinic keratosis 09/03/2023   Liver hemangioma 07/15/2023   Chest pain 05/23/2023   Class 2 severe obesity with serious comorbidity and body mass index (BMI) of 36.0 to 36.9 in adult 02/06/2023   Sleep disturbance 10/16/2022   PTSD (post-traumatic stress disorder) 05/29/2022   Bipolar I disorder, most recent episode depressed (HCC) 05/29/2022   Generalized abdominal pain  Small bowel intussusception (HCC)    Pre-operative clearance 10/11/2021   Closed nondisplaced fracture of right calcaneus with routine healing 01/05/2021   Plantar fasciitis 09/12/2020   Sinus disorder 08/02/2020   Bilateral leg edema 08/02/2020   Anxiety and depression    Abnormal finding on GI tract imaging    Hyponatremia 05/03/2019   COPD suggested by initial evaluation 02/15/2019   Chronic bilateral low back pain without sciatica 06/06/2018    Prediabetes 05/31/2018   Migraine without aura and with status migrainosus, not intractable 11/30/2017   Itching 05/13/2017   Neck pain 03/26/2017   Mood disorder 10/06/2016   Right ear pain 07/23/2016   GERD (gastroesophageal reflux disease) 10/19/2015   H/O bilateral oophorectomy 08/13/2015   Midline back pain 03/15/2015   GAD (generalized anxiety disorder) 01/06/2013   Tobacco abuse 11/10/2012   Obesity (BMI 35.0-39.9 without comorbidity) 11/10/2012   Hypertension 11/10/2012    PCP: Romelle Booty, MD  REFERRING PROVIDER: Georgina Ozell LABOR, MD  THERAPY DIAG:  Cervicalgia  Pain in left hip  REFERRING DIAG: Pain of left hip [M25.552], Neck pain [M54.2]   Rationale for Evaluation and Treatment:  Rehabilitation  SUBJECTIVE:  PERTINENT PAST HISTORY:  Migraine, COPD, anxiety and depression, PTSD, bipolar        PRECAUTIONS: NEEDS WEDGE  WEIGHT BEARING RESTRICTIONS No  FALLS:  Has patient fallen in last 6 months? No, Number of falls: 0  MOI/History of condition:  Onset date: ~1 year neck, <6 months hip pain  SUBJECTIVE STATEMENT 4/10 with hip and 9/10 with the neck. Has been doing HEP 75% compliance. Hip has started to feel better.    Eval: Pt is a 54 y.o. female who presents to clinic with chief complaint of neck and hip pain.  Neck pain started without clear cause.  Bil UE pain with intermittent n/t (particularly at night).  Feels like there may be some UE weakness.  No n/t currently.  Hip pain started after she lifted her elderly family members from the floor.  Isolated to anterior lateral hip.  Denies significant LE radicular sxs.  Denies clicking and locking.  Denies groin pain.  From referring provider:  -Patient is no longer responding to cervical ESI.  She has tried multiple conservative treatments without relief of her neck and radiating arm pain.  PT would be her last remaining conservative treatment.  Sending referral for that -In regards to her hip, I  prescribed Norco to help with the acute pain.  I also recommended PT for this as well.  If she does not do better with that treatment, could consider a left hip intra-articular injection -Talked about C5/6 ACDF as a treatment option for her if PT does not help her neck and arm pain   Pain:  Are you having pain? Yes Pain location: neck pain NPRS scale:  Best: 5/10, Worst: 10/10 Aggravating factors: read, looking up, reaching, L rotation Relieving factors: nothing Pain description: sharp and aching  Are you having pain? Yes Pain location: L hip NPRS scale:  Best: 0/10, Worst: 9/10 Aggravating factors: moving from sitting to standing Relieving factors: rest, gentle movement Pain description: sharp and aching  Occupation: work in [employer] Planner: NA  Hand Dominance: NA  Patient Goals/Specific Activities: reduce pain   OBJECTIVE:   DIAGNOSTIC FINDINGS:  MRI:  IMPRESSION: 1. 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 2. Mild right C5 and left C7 foraminal stenosis  related to disc bulge and uncovertebral disease. 3. Degenerative reactive endplate change with mild marrow edema at C4-5 through C6-7, greatest at C5-6. Finding could contribute to underlying neck pain.    SENSATION: Light touch: Appears intact   PALPATION: TTP anterior lateral hip/TFL  Cervical ROM  ROM ROM  (Eval)  Flexion 15*  Extension 0*  Right lateral flexion 15*  Left lateral flexion 10*  Right rotation 10*  Left rotation 15*  Flexion rotation (normal is 30 degrees)   Flexion rotation (normal is 30 degrees)     (Blank rows = not tested, N = WNL, * = concordant pain)  UPPER EXTREMITY MMT:  MMT Right (Eval) Left (Eval)  Shoulder flexion    Shoulder abduction (C5) c c  Shoulder ER    Shoulder IR    Middle trapezius    Lower trapezius    Shoulder extension    Grip strength 47 67  Shoulder  shrug (C4) c c  Elbow flexion (C6) c c  Elbow ext (C7) c c  Thumb ext (C8) c c  Finger abd (T1) c c  Grossly     (Blank rows = not tested, score listed is out of 5 possible points.  N = WNL, D = diminished, C = clear for gross weakness with myotome testing, * = concordant pain with testing)  LE MMT:  MMT Right Eval Left Eval  Hip flexion (L2, L3) 4+ 4*  Knee extension (L3) 4+ 4  Knee flexion    Hip abduction 3+ 3+  Hip extension    Hip external rotation 4+ 3+  Hip internal rotation 4+ 3+*  Hip adduction    Ankle dorsiflexion (L4)    Ankle plantarflexion (S1)    Ankle inversion    Ankle eversion    Great Toe ext (L5)    Grossly     (Blank rows = not tested, score listed is out of 5 possible points.  N = WNL, D = diminished, C = clear for gross weakness with myotome testing, * = concordant pain with testing)  LE ROM:  ROM Right (Eval) Left (Eval)  Hip flexion    Hip extension    Hip abduction    Hip adduction    Hip internal rotation  Limited with pain  Hip external rotation  [facility]  Knee extension    Knee flexion    Ankle dorsiflexion    Ankle plantarflexion    Ankle inversion    Ankle eversion     (Blank rows = not tested, N = WNL, * = concordant pain with testing)  SPECIAL TESTS:  Fadir and faber (+)   PATIENT SURVEYS:  NDI: 24/50    HOME EXERCISE PROGRAM: Access Code: [email or web address] URL: [email or web address] Date: 01/20/2025 Prepared by: Washington Scot  Exercises - Seated Hip Adduction Isometrics with Ball  - 1 x daily - 7 x weekly - 3 sets - 5 reps - 5'' hold - Seated Hip Abduction with Resistance  - 1 x daily - 7 x weekly - 3 sets - 8 reps - 5 sec hold - Seated Knee Lifts with Resistance  - 1 x daily - 7 x weekly - 3 sets - 3 reps - 5 sec hold - Seated Scapular Retraction  - 2 x daily - 5 x weekly - 2 sets - 4-6 reps - 3 hold  Treatment priorities   Eval        Progressive hip strengthening  TODAY'S TREATMENT:  01/20/25 Neuromuscular Reeducation: *Patient required extra time for exercises due to increased monitoring, reassessment, and rest due to low activity tolerance and/or high irritability of symptoms* *Patient required extra time for exercises due to additional reeducation on pain science, optimal loading, prognosis, and relevant tissues/anatomy*   HEP reassessment and update Seated chin tuck 2x6x3s (n/t on 6th rep down Bl arms) Seated thoracic extension x3x3s (DC increased n/t!) Seated neck rotation x6x3s (p! On last rep) Seated shrug x4x3s  Back supported Seated retraction 2x6x3s (helped p! And n/t down arms) Seated hip march x6x3s, x6x3s YTB, x6x3s RTB    Therapeutic Exercise: Creating, reviewing, and completing HEP   PATIENT EDUCATION ([place]/[facility]):  POC, diagnosis, prognosis, HEP, and outcome measures.  Pt educated via explanation, demonstration, and handout (HEP).  Pt confirms understanding verbally.   ASSESSMENT:  CLINICAL IMPRESSION: Patient tolerated treatment with no significant increases in pain with progressions in cervical sx modulation and BL hip loading. Current deficits include: excessive pain, cervical ROM, functional activity tolerance and strength. As a result, patient would continue to benefit from skilled PT to address said deficits via plan below.     Maciah is a 54 y.o. female who presents to clinic with signs and sxs consistent with chronic severe neck pain with concurrent L hip pain.   Hip pain appears articular in nature.  Cervical ROM is significantly limited in all planes with radiating sxs.  Issued HEP for hip and held on neck given irritability.  Pt unable to lay fully supine d/t COPD and requires wedge.   Keshara will benefit from skilled PT to address relevant deficits and improve comfort with daily tasks.   OBJECTIVE IMPAIRMENTS: Pain, L hip ROM, L hip and LE strength, cervical ROM  ACTIVITY LIMITATIONS: walking, standing, reading, head  turns, driving, reaching, housework, tranfers, toileting, self care  PERSONAL FACTORS: See medical history and pertinent history   REHAB POTENTIAL: Good  CLINICAL DECISION MAKING: Evolving/moderate complexity  EVALUATION COMPLEXITY: Moderate   GOALS:   SHORT TERM GOALS: Target date: 02/04/2025   Shreeya will be >75% HEP compliant to improve carryover between sessions and facilitate independent management of condition  Evaluation: ongoing Goal status: INITIAL   LONG TERM GOALS: Target date: 03/04/2025   Aspen will self report >/= 50% decrease in pain from evaluation to improve function in daily tasks  Evaluation/Baseline: 10/10 max pain neck, 9/10 hip  Goal status: INITIAL   2.  Kensy will show a >/= 8 pt improvement in their NDI score as a proxy for functional improvement  Evaluation/Baseline: 24/50 pts Goal status: INITIAL   3.  Jocie will be able to complete 5x sts, not limited by pain  Evaluation/Baseline: limited Goal status: INITIAL   4.  Aryahi will show a >/= 18 pt improvement in LEFS score (MCID is ~11% or 9 pts) as a proxy for functional improvement   Evaluation/Baseline: 27/80 pts Goal status: INITIAL   5.  Maddison will improve the following MMTs to >/= 4/5 to show improvement in strength:  hip tested on eval   Evaluation/Baseline: see chart in note Goal status: INITIAL   6.  Onita will improve show significant improvement in cervical ROM  Evaluation/Baseline: See chart Goal status: INITIAL   PLAN: PT FREQUENCY: 1-2x/week  PT DURATION: 8 weeks  PLANNED INTERVENTIONS:  [id number]- PT Re-evaluation, 97110-Therapeutic exercises, 97530- Therapeutic activity, [id number]- Neuromuscular re-education, 97535- Self Care, [id number]- Manual therapy, [id number]- Gait training, [id number]- Aquatic Therapy, [phone]- Electrical stimulation (manual), [id number]- Vasopneumatic device, [id number]-  Traction (mechanical), [id number]- Ionotophoresis 4mg /ml Dexamethasone , Taping, Dry Needling, Joint  manipulation, and Spinal manipulation.   Washington Greener Catarina Huntley PT 01/20/2025, 2:56 PM     "

## 2025-01-20 NOTE — Telephone Encounter (Signed)
 Mariah Rice wanting to speak with Dr. Jayme nurse regarding shots. She has requested a call back.   PH: [phone]

## 2025-01-20 NOTE — Telephone Encounter (Signed)
 New script sent with 3  Month supply.   Renewal needed for [facility] cares.

## 2025-01-27 ENCOUNTER — Ambulatory Visit: Admitting: Orthopedic Surgery

## 2025-02-01 ENCOUNTER — Ambulatory Visit

## 2025-02-03 ENCOUNTER — Ambulatory Visit

## 2025-02-08 ENCOUNTER — Ambulatory Visit: Admitting: Physical Therapy

## 2025-02-15 ENCOUNTER — Ambulatory Visit

## 2025-02-15 ENCOUNTER — Ambulatory Visit: Payer: Self-pay | Admitting: Neurology

## 2025-02-22 ENCOUNTER — Ambulatory Visit

## 2025-03-01 ENCOUNTER — Telehealth (HOSPITAL_COMMUNITY): Admitting: Physician Assistant

## 2025-04-20 ENCOUNTER — Ambulatory Visit: Admitting: Neurology
